# Patient Record
Sex: Female | Born: 1965 | Race: Black or African American | Hispanic: No | Smoking: Never smoker
Health system: Southern US, Community
[De-identification: ages and names within clinical notes are randomized; demographics above are authoritative.]

## PROBLEM LIST (undated history)

## (undated) ENCOUNTER — Emergency Department (HOSPITAL_COMMUNITY): Admission: EM | Payer: Medicaid Other

## (undated) DIAGNOSIS — L988 Other specified disorders of the skin and subcutaneous tissue: Secondary | ICD-10-CM

## (undated) DIAGNOSIS — I429 Cardiomyopathy, unspecified: Secondary | ICD-10-CM

## (undated) DIAGNOSIS — R7611 Nonspecific reaction to tuberculin skin test without active tuberculosis: Secondary | ICD-10-CM

## (undated) DIAGNOSIS — I509 Heart failure, unspecified: Secondary | ICD-10-CM

## (undated) DIAGNOSIS — F329 Major depressive disorder, single episode, unspecified: Secondary | ICD-10-CM

## (undated) DIAGNOSIS — F32A Depression, unspecified: Secondary | ICD-10-CM

## (undated) DIAGNOSIS — Z21 Asymptomatic human immunodeficiency virus [HIV] infection status: Secondary | ICD-10-CM

## (undated) DIAGNOSIS — B20 Human immunodeficiency virus [HIV] disease: Secondary | ICD-10-CM

## (undated) HISTORY — DX: Cardiomyopathy, unspecified: I42.9

## (undated) HISTORY — DX: Major depressive disorder, single episode, unspecified: F32.9

## (undated) HISTORY — DX: Asymptomatic human immunodeficiency virus (hiv) infection status: Z21

## (undated) HISTORY — DX: Human immunodeficiency virus (HIV) disease: B20

## (undated) HISTORY — DX: Other specified disorders of the skin and subcutaneous tissue: L98.8

## (undated) HISTORY — DX: Nonspecific reaction to tuberculin skin test without active tuberculosis: R76.11

## (undated) HISTORY — DX: Depression, unspecified: F32.A

## (undated) HISTORY — PX: ABDOMINAL HYSTERECTOMY: SHX81

## (undated) HISTORY — DX: Heart failure, unspecified: I50.9

---

## 1999-05-11 ENCOUNTER — Ambulatory Visit (HOSPITAL_COMMUNITY): Admission: RE | Admit: 1999-05-11 | Discharge: 1999-05-11 | Payer: Self-pay | Admitting: Internal Medicine

## 1999-05-11 ENCOUNTER — Encounter: Payer: Self-pay | Admitting: Internal Medicine

## 1999-06-03 ENCOUNTER — Emergency Department (HOSPITAL_COMMUNITY): Admission: EM | Admit: 1999-06-03 | Discharge: 1999-06-03 | Payer: Self-pay | Admitting: Emergency Medicine

## 1999-09-16 ENCOUNTER — Encounter: Payer: Self-pay | Admitting: Emergency Medicine

## 1999-09-16 ENCOUNTER — Emergency Department (HOSPITAL_COMMUNITY): Admission: EM | Admit: 1999-09-16 | Discharge: 1999-09-16 | Payer: Self-pay | Admitting: Emergency Medicine

## 1999-10-29 DIAGNOSIS — E1165 Type 2 diabetes mellitus with hyperglycemia: Secondary | ICD-10-CM

## 1999-10-29 DIAGNOSIS — E1140 Type 2 diabetes mellitus with diabetic neuropathy, unspecified: Secondary | ICD-10-CM | POA: Insufficient documentation

## 2000-05-19 ENCOUNTER — Emergency Department (HOSPITAL_COMMUNITY): Admission: EM | Admit: 2000-05-19 | Discharge: 2000-05-19 | Payer: Self-pay | Admitting: Emergency Medicine

## 2000-05-28 ENCOUNTER — Other Ambulatory Visit: Admission: RE | Admit: 2000-05-28 | Discharge: 2000-05-28 | Payer: Self-pay | Admitting: Family Medicine

## 2000-10-14 ENCOUNTER — Encounter: Admission: RE | Admit: 2000-10-14 | Discharge: 2000-10-14 | Payer: Self-pay | Admitting: Obstetrics & Gynecology

## 2001-02-20 ENCOUNTER — Emergency Department (HOSPITAL_COMMUNITY): Admission: EM | Admit: 2001-02-20 | Discharge: 2001-02-20 | Payer: Self-pay | Admitting: Emergency Medicine

## 2001-07-23 ENCOUNTER — Other Ambulatory Visit: Admission: RE | Admit: 2001-07-23 | Discharge: 2001-07-23 | Payer: Self-pay | Admitting: *Deleted

## 2001-07-23 ENCOUNTER — Encounter: Admission: RE | Admit: 2001-07-23 | Discharge: 2001-07-23 | Payer: Self-pay | Admitting: *Deleted

## 2001-08-06 ENCOUNTER — Encounter: Admission: RE | Admit: 2001-08-06 | Discharge: 2001-08-06 | Payer: Self-pay | Admitting: *Deleted

## 2001-11-02 ENCOUNTER — Inpatient Hospital Stay (HOSPITAL_COMMUNITY): Admission: AD | Admit: 2001-11-02 | Discharge: 2001-11-02 | Payer: Self-pay | Admitting: *Deleted

## 2001-11-27 ENCOUNTER — Other Ambulatory Visit: Admission: RE | Admit: 2001-11-27 | Discharge: 2001-11-27 | Payer: Self-pay | Admitting: *Deleted

## 2001-11-27 ENCOUNTER — Encounter (INDEPENDENT_AMBULATORY_CARE_PROVIDER_SITE_OTHER): Payer: Self-pay | Admitting: Specialist

## 2001-11-27 ENCOUNTER — Encounter: Admission: RE | Admit: 2001-11-27 | Discharge: 2001-11-27 | Payer: Self-pay | Admitting: *Deleted

## 2002-06-03 ENCOUNTER — Other Ambulatory Visit: Admission: RE | Admit: 2002-06-03 | Discharge: 2002-06-03 | Payer: Self-pay | Admitting: *Deleted

## 2002-06-03 ENCOUNTER — Encounter: Admission: RE | Admit: 2002-06-03 | Discharge: 2002-06-03 | Payer: Self-pay | Admitting: Obstetrics and Gynecology

## 2002-06-04 ENCOUNTER — Encounter (INDEPENDENT_AMBULATORY_CARE_PROVIDER_SITE_OTHER): Payer: Self-pay

## 2002-07-02 ENCOUNTER — Encounter: Admission: RE | Admit: 2002-07-02 | Discharge: 2002-07-02 | Payer: Self-pay | Admitting: Family Medicine

## 2002-07-16 ENCOUNTER — Encounter: Admission: RE | Admit: 2002-07-16 | Discharge: 2002-07-16 | Payer: Self-pay | Admitting: Family Medicine

## 2002-08-02 ENCOUNTER — Ambulatory Visit (HOSPITAL_COMMUNITY): Admission: RE | Admit: 2002-08-02 | Discharge: 2002-08-02 | Payer: Self-pay | Admitting: Family Medicine

## 2002-08-02 ENCOUNTER — Encounter (INDEPENDENT_AMBULATORY_CARE_PROVIDER_SITE_OTHER): Payer: Self-pay | Admitting: Specialist

## 2002-08-13 ENCOUNTER — Encounter: Admission: RE | Admit: 2002-08-13 | Discharge: 2002-08-13 | Payer: Self-pay | Admitting: Family Medicine

## 2002-09-09 ENCOUNTER — Encounter: Admission: RE | Admit: 2002-09-09 | Discharge: 2002-09-09 | Payer: Self-pay | Admitting: Obstetrics and Gynecology

## 2002-09-27 ENCOUNTER — Ambulatory Visit: Admission: AD | Admit: 2002-09-27 | Discharge: 2002-09-27 | Payer: Self-pay | Admitting: Family Medicine

## 2002-11-08 ENCOUNTER — Inpatient Hospital Stay (HOSPITAL_COMMUNITY): Admission: AD | Admit: 2002-11-08 | Discharge: 2002-11-09 | Payer: Self-pay | Admitting: Family Medicine

## 2002-11-08 ENCOUNTER — Encounter (INDEPENDENT_AMBULATORY_CARE_PROVIDER_SITE_OTHER): Payer: Self-pay | Admitting: Specialist

## 2002-11-15 ENCOUNTER — Inpatient Hospital Stay (HOSPITAL_COMMUNITY): Admission: AD | Admit: 2002-11-15 | Discharge: 2002-11-15 | Payer: Self-pay | Admitting: Obstetrics and Gynecology

## 2002-11-26 ENCOUNTER — Encounter: Admission: RE | Admit: 2002-11-26 | Discharge: 2002-11-26 | Payer: Self-pay | Admitting: Family Medicine

## 2002-12-10 ENCOUNTER — Encounter: Admission: RE | Admit: 2002-12-10 | Discharge: 2002-12-10 | Payer: Self-pay | Admitting: Obstetrics and Gynecology

## 2003-04-04 ENCOUNTER — Emergency Department (HOSPITAL_COMMUNITY): Admission: EM | Admit: 2003-04-04 | Discharge: 2003-04-04 | Payer: Self-pay | Admitting: Emergency Medicine

## 2003-10-17 ENCOUNTER — Emergency Department (HOSPITAL_COMMUNITY): Admission: EM | Admit: 2003-10-17 | Discharge: 2003-10-18 | Payer: Self-pay | Admitting: Emergency Medicine

## 2004-01-30 ENCOUNTER — Emergency Department (HOSPITAL_COMMUNITY): Admission: EM | Admit: 2004-01-30 | Discharge: 2004-01-30 | Payer: Self-pay | Admitting: *Deleted

## 2004-11-30 ENCOUNTER — Emergency Department (HOSPITAL_COMMUNITY): Admission: EM | Admit: 2004-11-30 | Discharge: 2004-11-30 | Payer: Self-pay | Admitting: Emergency Medicine

## 2005-05-02 ENCOUNTER — Ambulatory Visit: Payer: Self-pay | Admitting: Nurse Practitioner

## 2005-05-07 ENCOUNTER — Ambulatory Visit: Payer: Self-pay | Admitting: *Deleted

## 2005-05-18 ENCOUNTER — Emergency Department (HOSPITAL_COMMUNITY): Admission: EM | Admit: 2005-05-18 | Discharge: 2005-05-18 | Payer: Self-pay | Admitting: Emergency Medicine

## 2005-05-21 ENCOUNTER — Ambulatory Visit: Payer: Self-pay | Admitting: Nurse Practitioner

## 2005-10-31 ENCOUNTER — Emergency Department (HOSPITAL_COMMUNITY): Admission: EM | Admit: 2005-10-31 | Discharge: 2005-10-31 | Payer: Self-pay | Admitting: Emergency Medicine

## 2005-12-11 ENCOUNTER — Ambulatory Visit: Payer: Self-pay | Admitting: Nurse Practitioner

## 2006-04-11 ENCOUNTER — Emergency Department (HOSPITAL_COMMUNITY): Admission: EM | Admit: 2006-04-11 | Discharge: 2006-04-11 | Payer: Self-pay | Admitting: Emergency Medicine

## 2006-04-14 ENCOUNTER — Ambulatory Visit: Payer: Self-pay | Admitting: Internal Medicine

## 2006-10-29 DIAGNOSIS — F438 Other reactions to severe stress: Secondary | ICD-10-CM

## 2006-11-05 ENCOUNTER — Encounter (INDEPENDENT_AMBULATORY_CARE_PROVIDER_SITE_OTHER): Payer: Self-pay | Admitting: *Deleted

## 2008-09-13 LAB — CONVERTED CEMR LAB: CD4 T Helper %: 42.3 %

## 2008-10-13 ENCOUNTER — Encounter: Payer: Self-pay | Admitting: Licensed Clinical Social Worker

## 2008-10-13 ENCOUNTER — Ambulatory Visit: Payer: Self-pay | Admitting: Internal Medicine

## 2008-10-13 DIAGNOSIS — B20 Human immunodeficiency virus [HIV] disease: Secondary | ICD-10-CM | POA: Insufficient documentation

## 2008-10-19 LAB — CONVERTED CEMR LAB
ALT: 11 units/L (ref 0–35)
AST: 13 units/L (ref 0–37)
Albumin: 4.2 g/dL (ref 3.5–5.2)
Alkaline Phosphatase: 66 units/L (ref 39–117)
BUN: 8 mg/dL (ref 6–23)
Basophils Absolute: 0.1 10*3/uL (ref 0.0–0.1)
CO2: 22 meq/L (ref 19–32)
Chlamydia, Swab/Urine, PCR: NEGATIVE
Chloride: 103 meq/L (ref 96–112)
Creatinine, Ser: 0.79 mg/dL (ref 0.40–1.20)
Eosinophils Relative: 2 % (ref 0–5)
HIV-1 antibody: POSITIVE — AB
HIV-2 Ab: UNDETERMINED — AB
Hemoglobin: 12.4 g/dL (ref 12.0–15.0)
Hepatitis B Surface Ag: NEGATIVE
MCV: 70.1 fL — ABNORMAL LOW (ref >=78.0)
Monocytes Absolute: 0.6 10*3/uL (ref 0.1–1.0)
Monocytes Relative: 8 % (ref 3–12)
Neutro Abs: 2.3 10*3/uL (ref 1.7–7.7)
Neutrophils Relative %: 32 % — ABNORMAL LOW (ref 43–77)
Nitrite: NEGATIVE
Platelets: 402 10*3/uL — ABNORMAL HIGH (ref 150–400)
Potassium: 3.6 meq/L (ref 3.5–5.3)
RBC / HPF: NONE SEEN (ref <3)
Sodium: 139 meq/L (ref 135–145)
Specific Gravity, Urine: 1.029 (ref 1.005–1.0)
Total Protein: 7.8 g/dL (ref 6.0–8.3)
Urine Glucose: NEGATIVE mg/dL
pH: 6 (ref 5.0–8.0)

## 2008-10-26 ENCOUNTER — Telehealth: Payer: Self-pay | Admitting: Licensed Clinical Social Worker

## 2008-10-28 ENCOUNTER — Telehealth (INDEPENDENT_AMBULATORY_CARE_PROVIDER_SITE_OTHER): Payer: Self-pay | Admitting: *Deleted

## 2008-11-07 ENCOUNTER — Telehealth (INDEPENDENT_AMBULATORY_CARE_PROVIDER_SITE_OTHER): Payer: Self-pay | Admitting: *Deleted

## 2008-11-23 ENCOUNTER — Ambulatory Visit (HOSPITAL_COMMUNITY): Admission: RE | Admit: 2008-11-23 | Discharge: 2008-11-23 | Payer: Self-pay | Admitting: Internal Medicine

## 2008-11-23 ENCOUNTER — Ambulatory Visit: Payer: Self-pay | Admitting: Internal Medicine

## 2008-11-24 ENCOUNTER — Telehealth: Payer: Self-pay

## 2009-03-21 ENCOUNTER — Ambulatory Visit: Payer: Self-pay | Admitting: Internal Medicine

## 2009-03-22 ENCOUNTER — Telehealth (INDEPENDENT_AMBULATORY_CARE_PROVIDER_SITE_OTHER): Payer: Self-pay | Admitting: Licensed Clinical Social Worker

## 2009-03-22 LAB — CONVERTED CEMR LAB
AST: 8 units/L (ref 0–37)
Basophils Relative: 0 % (ref 0–1)
Chloride: 97 meq/L (ref 96–112)
Eosinophils Absolute: 0 10*3/uL (ref 0.0–0.7)
Eosinophils Relative: 0 % (ref 0–5)
HCT: 41 % (ref 36.0–46.0)
Hemoglobin: 12.9 g/dL (ref 12.0–15.0)
Lymphs Abs: 4.1 10*3/uL — ABNORMAL HIGH (ref 0.7–4.0)
MCHC: 31.5 g/dL (ref 30.0–36.0)
Monocytes Absolute: 0.5 10*3/uL (ref 0.1–1.0)
Neutro Abs: 2.5 10*3/uL (ref 1.7–7.7)
Platelets: 325 10*3/uL (ref 150–400)
Potassium: 4.3 meq/L (ref 3.5–5.3)
RBC: 6 M/uL — ABNORMAL HIGH (ref 3.87–5.11)
Total Bilirubin: 0.3 mg/dL (ref 0.3–1.2)
WBC: 7.2 10*3/uL (ref 4.0–10.5)

## 2009-04-04 ENCOUNTER — Ambulatory Visit: Payer: Self-pay | Admitting: Internal Medicine

## 2009-04-04 ENCOUNTER — Telehealth (INDEPENDENT_AMBULATORY_CARE_PROVIDER_SITE_OTHER): Payer: Self-pay | Admitting: *Deleted

## 2009-05-05 ENCOUNTER — Telehealth: Payer: Self-pay | Admitting: Internal Medicine

## 2009-07-03 ENCOUNTER — Ambulatory Visit: Payer: Self-pay | Admitting: Internal Medicine

## 2009-07-03 LAB — CONVERTED CEMR LAB
Alkaline Phosphatase: 66 units/L (ref 39–117)
BUN: 15 mg/dL (ref 6–23)
Basophils Absolute: 0 10*3/uL (ref 0.0–0.1)
CO2: 20 meq/L (ref 19–32)
Calcium: 9.2 mg/dL (ref 8.4–10.5)
Eosinophils Absolute: 0.1 10*3/uL (ref 0.0–0.7)
Eosinophils Relative: 1 % (ref 0–5)
HCT: 39.3 % (ref 36.0–46.0)
HIV 1 RNA Quant: 1350 copies/mL — ABNORMAL HIGH (ref <48)
HIV-1 RNA Quant, Log: 3.13 — ABNORMAL HIGH (ref <1.68)
Lymphocytes Relative: 43 % (ref 12–46)
MCHC: 31.3 g/dL (ref 30.0–36.0)
MCV: 67.1 fL — ABNORMAL LOW (ref 78.0–100.0)
Monocytes Relative: 7 % (ref 3–12)
Neutro Abs: 4.6 10*3/uL (ref 1.7–7.7)
Neutrophils Relative %: 49 % (ref 43–77)
Potassium: 4.2 meq/L (ref 3.5–5.3)
RDW: 14.7 % (ref 11.5–15.5)
Sodium: 133 meq/L — ABNORMAL LOW (ref 135–145)
WBC: 9.4 10*3/uL (ref 4.0–10.5)

## 2009-07-31 ENCOUNTER — Ambulatory Visit: Payer: Self-pay | Admitting: Internal Medicine

## 2009-10-18 ENCOUNTER — Ambulatory Visit: Payer: Self-pay | Admitting: Infectious Disease

## 2009-10-18 DIAGNOSIS — L293 Anogenital pruritus, unspecified: Secondary | ICD-10-CM

## 2009-10-18 DIAGNOSIS — N898 Other specified noninflammatory disorders of vagina: Secondary | ICD-10-CM | POA: Insufficient documentation

## 2009-10-18 LAB — CONVERTED CEMR LAB
GC Probe Amp, Urine: NEGATIVE
Ketones, ur: NEGATIVE mg/dL
Leukocytes, UA: NEGATIVE
Protein, ur: NEGATIVE mg/dL
Urobilinogen, UA: 0.2 (ref 0.0–1.0)

## 2009-11-02 ENCOUNTER — Ambulatory Visit: Payer: Self-pay | Admitting: Internal Medicine

## 2009-11-02 LAB — CONVERTED CEMR LAB
AST: 11 units/L (ref 0–37)
Albumin: 3.9 g/dL (ref 3.5–5.2)
Alkaline Phosphatase: 73 units/L (ref 39–117)
Basophils Relative: 1 % (ref 0–1)
CO2: 19 meq/L (ref 19–32)
Calcium: 9.1 mg/dL (ref 8.4–10.5)
Chloride: 96 meq/L (ref 96–112)
Glucose, Bld: 530 mg/dL — ABNORMAL HIGH (ref 70–99)
Hemoglobin: 12.6 g/dL (ref 12.0–15.0)
Lymphs Abs: 4.2 10*3/uL — ABNORMAL HIGH (ref 0.7–4.0)
MCV: 68.8 fL — ABNORMAL LOW (ref 78.0–100.0)
Monocytes Relative: 7 % (ref 3–12)
Neutro Abs: 1.6 10*3/uL — ABNORMAL LOW (ref 1.7–7.7)
Neutrophils Relative %: 26 % — ABNORMAL LOW (ref 43–77)
Platelets: 292 10*3/uL (ref 150–400)
RBC: 5.81 M/uL — ABNORMAL HIGH (ref 3.87–5.11)
Sodium: 131 meq/L — ABNORMAL LOW (ref 135–145)

## 2009-11-06 ENCOUNTER — Telehealth (INDEPENDENT_AMBULATORY_CARE_PROVIDER_SITE_OTHER): Payer: Self-pay | Admitting: *Deleted

## 2009-11-17 ENCOUNTER — Ambulatory Visit: Payer: Self-pay | Admitting: Internal Medicine

## 2009-11-17 LAB — CONVERTED CEMR LAB
Chlamydia, Swab/Urine, PCR: NEGATIVE
GC Probe Amp, Urine: NEGATIVE
Hgb A1c MFr Bld: 16.3 % — ABNORMAL HIGH (ref <5.7)

## 2009-11-18 ENCOUNTER — Encounter: Payer: Self-pay | Admitting: Internal Medicine

## 2009-11-20 LAB — CONVERTED CEMR LAB: Gardnerella vaginalis: POSITIVE — AB

## 2010-01-05 ENCOUNTER — Ambulatory Visit: Payer: Self-pay | Admitting: Internal Medicine

## 2010-01-05 DIAGNOSIS — F322 Major depressive disorder, single episode, severe without psychotic features: Secondary | ICD-10-CM | POA: Insufficient documentation

## 2010-01-08 ENCOUNTER — Encounter (INDEPENDENT_AMBULATORY_CARE_PROVIDER_SITE_OTHER): Payer: Self-pay | Admitting: *Deleted

## 2010-01-08 LAB — CONVERTED CEMR LAB
Candida species: POSITIVE — AB
Gardnerella vaginalis: POSITIVE — AB

## 2010-03-08 ENCOUNTER — Ambulatory Visit: Admit: 2010-03-08 | Payer: Self-pay | Admitting: Internal Medicine

## 2010-03-11 ENCOUNTER — Encounter: Payer: Self-pay | Admitting: Internal Medicine

## 2010-03-20 NOTE — Assessment & Plan Note (Signed)
Summary: follow up on labs/jc   CC:  follow-up visit, lab results, and pt. has been out of diabetic meds x 3 months.  History of Present Illness: Pt feels tired today.  She has been out of her DM meds for several months. She states that she does not have the money to pay for them.  Preventive Screening-Counseling & Management  Alcohol-Tobacco     Alcohol drinks/day: 2     Smoking Status: never  Caffeine-Diet-Exercise     Caffeine use/day: 0     Does Patient Exercise: yes     Type of exercise: work  Environmental education officer Use: yes      Sexual History:  currently monogamous.        Drug Use:  never.        Blood Transfusions:  no.        Travel History:  no.    Comments: pt. declined condoms   Updated Prior Medication List: METFORMIN HCL 500 MG  TABS (METFORMIN HCL) two times a day GLIPIZIDE 10 MG  TABS (GLIPIZIDE) one tablet two times a day ADULT ASPIRIN EC LOW STRENGTH 81 MG  TBEC (ASPIRIN) once daily  Current Allergies (reviewed today): No known allergies  Past History:  Past Medical History: Last updated: 10/29/2006 Diabetes mellitus, type II  Review of Systems  The patient denies anorexia, fever, and weight loss.    Vital Signs:  Patient profile:   45 year old female Menstrual status:  hysterectomy Height:      63 inches (160.02 cm) Weight:      167.8 pounds (76.27 kg) BMI:     29.83 Temp:     98.5 degrees F (36.94 degrees C) oral Pulse rate:   85 / minute BP sitting:   110 / 76  (right arm)  Vitals Entered By: Wendall Mola CMA Duncan Dull) (July 31, 2009 2:54 PM) CC: follow-up visit, lab results, pt. has been out of diabetic meds x 3 months Is Patient Diabetic? Yes Did you bring your meter with you today? No Pain Assessment Patient in pain? no      Nutritional Status BMI of 25 - 29 = overweight Nutritional Status Detail appetite "low"  Does patient need assistance? Functional Status Self care Ambulation  Normal   Physical Exam  General:  alert, well-developed, well-nourished, and well-hydrated.   Head:  normocephalic and atraumatic.   Mouth:  pharynx pink and moist.   Lungs:  normal breath sounds.      Impression & Recommendations:  Problem # 1:  HIV INFECTION (ICD-042)  Pt currently asymptomatic and not on medications. Will give her the second Hep B vaccine today and schedule for PAP.   Diagnostics Reviewed:  HIV: REACTIVE (10/13/2008)   HIV-Western blot: Positive (10/13/2008)   CD4: 1280 (07/04/2009)   WBC: 9.4 (07/03/2009)   Hgb: 12.3 (07/03/2009)   HCT: 39.3 (07/03/2009)   Platelets: 333 (07/03/2009) HIV-1 RNA: 1350 (07/03/2009)   HBSAg: NEG (10/13/2008)  Orders: Est. Patient Level IV (99214)Future Orders: T-CD4SP ([facility]) (CD4SP) ... 01/27/2010 T-HIV Viral Load [id number]) ... 01/27/2010 T-Comprehensive Metabolic Panel [id number]) ... 01/27/2010 T-CBC w/Diff ([id number]) ... 01/27/2010  Problem # 2:  DIABETES MELLITUS, TYPE II (ICD-250.00)  refill meds refer to [name] for DM education check HgbA1c Her updated medication list for this problem includes:    Metformin Hcl 500 Mg Tabs (Metformin hcl) .Marland Kitchen..Marland Kitchen Two times a day    Glipizide 10 Mg Tabs (Glipizide) ..... One tablet two times  a day    Adult Aspirin Ec Low Strength 81 Mg Tbec (Aspirin) ..... Once daily  Orders: Est. Patient Level IV (16109) T-Hgb A1C (in-house) (60454UJ) Diabetic Clinic Referral (Diabetic)  Other Orders: Hepatitis B Vaccine >62yrs (81191) Admin 1st Vaccine (47829)  Patient Instructions: 1)  Please schedule a follow-up appointment in 3 months, 2 weeks after labs. 2)  Schedule for PAP in PAP clinic  Prescriptions: METFORMIN HCL 500 MG  TABS (METFORMIN HCL) two times a day  #60 x 5   Entered and Authorized by:   Yisroel Ramming MD   Signed by:   Yisroel Ramming MD on 07/31/2009   Method used:   Print then Give to Patient   RxID:   [id number] GLIPIZIDE 10 MG  TABS (GLIPIZIDE) one  tablet two times a day  #60 x 5   Entered and Authorized by:   Yisroel Ramming MD   Signed by:   Yisroel Ramming MD on 07/31/2009   Method used:   Print then Give to Patient   RxID:   [id number]   Laboratory Results   Blood Tests   Date/Time Received: Mariea Clonts  July 31, 2009 4:35 PM  Date/Time Reported: Mariea Clonts  July 31, 2009 4:35 PM   HGBA1C: >14.0%   (Normal Range: Non-Diabetic - 3-6%   Control Diabetic - 6-8%)  Comments: Results given to [facility]).      Immunizations Administered:  Hepatitis B Vaccine # 2:    Vaccine Type: HepB Adult    Site: right deltoid    Mfr: Merck    Dose: 0.5 ml    Route: IM    Given by: Wendall Mola CMA ( AAMA)    Exp. Date: 06/18/2011    Lot #: [id number]    VIS given: 09/04/05 version given July 31, 2009.

## 2010-03-20 NOTE — Assessment & Plan Note (Signed)
Summary: F/U/VS   CC:  follow-up visit, lab results to be checked for trichomonos, gonnorhea, and and chlamydia.  History of Present Illness: Pt had unprotected sex and wants to be tested for STDs.  She c/o vagina itching. She has started exercising more to try to get her DM under better control  Preventive Screening-Counseling & Management  Alcohol-Tobacco     Alcohol drinks/day: 2     Smoking Status: never  Caffeine-Diet-Exercise     Caffeine use/day: sodas and tea     Does Patient Exercise: yes     Type of exercise: work  Environmental education officer Use: yes      Sexual History:  currently monogamous.        Drug Use:  never.        Blood Transfusions:  no.        Travel History:  no.    Comments: pt. given condoms   Updated Prior Medication List: METFORMIN HCL 500 MG  TABS (METFORMIN HCL) two times a day GLIPIZIDE 10 MG  TABS (GLIPIZIDE) one tablet two times a day ADULT ASPIRIN EC LOW STRENGTH 81 MG  TBEC (ASPIRIN) once daily  Current Allergies (reviewed today): No known allergies  Past History:  Past Medical History: Last updated: 10/29/2006 Diabetes mellitus, type II  Review of Systems  The patient denies anorexia, fever, and weight loss.    Vital Signs:  Patient profile:   45 year old female Menstrual status:  hysterectomy Height:      63 inches (160.02 cm) Weight:      168.8 pounds (76.73 kg) BMI:     30.01 Temp:     97.6 degrees F (36.44 degrees C) oral Pulse rate:   80 / minute BP sitting:   117 / 82  (left arm)  Vitals Entered By: Wendall Mola CMA Duncan Dull) (November 17, 2009 3:16 PM) CC: follow-up visit, lab results to be checked for trichomonos, gonnorhea, and chlamydia Is Patient Diabetic? Yes Did you bring your meter with you today? No Pain Assessment Patient in pain? no      Nutritional Status BMI of > 30 = obese Nutritional Status Detail appetite "good"  Does patient need assistance? Functional Status  Self care Ambulation Normal Comments no missed doses of meds per pt.   Physical Exam  General:  alert, well-developed, well-nourished, and well-hydrated.   Head:  normocephalic and atraumatic.   Mouth:  pharynx pink and moist.   Lungs:  normal breath sounds.      Impression & Recommendations:  Problem # 1:  HIV INFECTION (ICD-042) Pt currently asymptomatic and not on therapy. She will return in 6 months for repeat labs. Diagnostics Reviewed:  HIV: REACTIVE (10/13/2008)   HIV-Western blot: Positive (10/13/2008)   CD4: 1370 (11/03/2009)   WBC: 6.4 (11/02/2009)   Hgb: 12.6 (11/02/2009)   HCT: 40.0 (11/02/2009)   Platelets: 292 (11/02/2009) HIV-1 RNA: 1930 (11/02/2009)   HBSAg: NEG (10/13/2008)  Orders: T-Chlamydia  Probe, urine ([id number]) T-GC Probe, urine ([id number]) T-Wet Prep by Molecular Probe ([id number])Future Orders: T-CD4SP ([facility]) (CD4SP) ... 05/16/2010 T-HIV Viral Load [id number]) ... 05/16/2010 T-Comprehensive Metabolic Panel [id number]) ... 05/16/2010 T-CBC w/Diff ([id number]) ... 05/16/2010 T-RPR (Syphilis) [phone]) ... 05/16/2010 T-Lipid Profile [id number]) ... 05/16/2010  Problem # 2:  VAGINAL DISCHARGE (ICD-623.5) check wet prep, GC and chlamydia fluconazole for possible yeast infection  Problem # 3:  DIABETES MELLITUS, TYPE II (ICD-250.00) pt encouraged to take her meds, follow diet and exercise Her  updated medication list for this problem includes:    Metformin Hcl 500 Mg Tabs (Metformin hcl) .Marland Kitchen..Marland Kitchen Two times a day    Glipizide 10 Mg Tabs (Glipizide) ..... One tablet two times a day    Adult Aspirin Ec Low Strength 81 Mg Tbec (Aspirin) ..... Once daily  Orders: T-Hgb A1C (in-house) [phone])  Other Orders: Influenza Vaccine NON MCR (02725) TB Skin Test (36644) Admin 1st Vaccine (03474)  Patient Instructions: 1)  Please schedule a follow-up appointment in 6 months, 2 weeks after labs.  Prescriptions: FLUCONAZOLE 150 MG TABS  (FLUCONAZOLE) Take 1 tablet by mouth once a day  #2 x 0   Entered and Authorized by:   Yisroel Ramming MD   Signed by:   Yisroel Ramming MD on 11/17/2009   Method used:   Print then Give to Patient   RxID:   [id number]    Immunizations Administered:  Influenza Vaccine # 1:    Vaccine Type: Fluvax Non-MCR    Site: left deltoid    Mfr: Novartis    Dose: 0.5 ml    Route: IM    Given by: Wendall Mola CMA ( AAMA)    Exp. Date: 05/20/2010    Lot #: [id number]    VIS given: 09/12/09 version given November 17, 2009.  PPD Skin Test:    Vaccine Type: PPD    Site: left forearm    Mfr: Sanofi Pasteur    Dose: 0.1 ml    Route: ID    Given by: Wendall Mola CMA ( AAMA)    Exp. Date: 12/21/2010    Lot #: [id number]  Flu Vaccine Consent Questions:    Do you have a history of severe allergic reactions to this vaccine? no    Any prior history of allergic reactions to egg and/or gelatin? no    Do you have a sensitivity to the preservative Thimersol? no    Do you have a past history of Guillan-Barre Syndrome? no    Do you currently have an acute febrile illness? no    Have you ever had a severe reaction to latex? no    Vaccine information given and explained to patient? yes    Are you currently pregnant? no    Appended Document: Immunization Entry      PPD Results    Date of reading: 11/20/2009    Results: < 5mm    Interpretation: negative

## 2010-03-20 NOTE — Progress Notes (Signed)
Summary: diabetes referral/dmr  Phone Note Outgoing Call   Call placed by: Jamison Neighbor RD,CDE,  April 04, 2009 4:38 PM Summary of Call: asked to call and schedule visit with CDE by DR. Vollmer- called phone number twice. Female hung up on me twice. Discussed with Tamika. Other contact numbers not working. will mail letter asking patient to call to schedule.

## 2010-03-20 NOTE — Progress Notes (Signed)
Summary: phone note-TY  Phone Note Outgoing Call   Caller: Patient Call placed by: Starleen Arms CMA,  March 22, 2009 10:53 AM Call placed to: Patient Summary of Call: Called patient to inform her that her glucose was 405 and to see if she has been taking her DM  meds. Unable to reach her on the phone number listed. Needs a referral to Jamison Neighbor. Initial call taken by: Starleen Arms CMA,  March 22, 2009 10:54 AM     Appended Document: phone note-TY I tried to call patient today and was unable to reach her by phone.She has an appt on 2/14

## 2010-03-20 NOTE — Assessment & Plan Note (Signed)
Summary: vaginal irritation/ty   CC:  vaginal irritation.  History of Present Illness: Pt is a 45 y/o F with HIV, currently not on ART who presents today with c/c of vaginal itching and pain.  SHe reports 2 weeks of vaginal itching, swelling, and pain.  She notes small amouts of clear vaginal discharge with occasional, white patchy clumps.  She states this feels like chlamydia, gonorrhea, or trichomonas and is not like a typical yeast infection.    She denies fever, chills, abdominal/pelvic pain , dysuria, hematuria, foul odor, vaginal bleeding, urinary urgency or freq, flank pain, n/v/d, rectal bleeding, thick green or purulent vaginal discharge.  She does not have any allergies. She is currently on day 2/7 of otc vaginal monistat with no significant relief.  She denies any recent abx.  Believes she has an std from having unprotected sex with an unfaithful boyfriend.  Pt refuses pelvic exam today, states she does not have the time to stay for a pelvic exam or lab work .      Preventive Screening-Counseling & Management  Alcohol-Tobacco     Alcohol drinks/day: 2     Smoking Status: never  Caffeine-Diet-Exercise     Caffeine use/day: sodas and tea     Does Patient Exercise: yes     Type of exercise: work  Environmental education officer Use: yes   Updated Prior Medication List: METFORMIN HCL 500 MG  TABS (METFORMIN HCL) two times a day GLIPIZIDE 10 MG  TABS (GLIPIZIDE) one tablet two times a day ADULT ASPIRIN EC LOW STRENGTH 81 MG  TBEC (ASPIRIN) once daily METRONIDAZOLE 500 MG TABS (METRONIDAZOLE) Take 4 pill by mouth at once CEFTRIAXONE SODIUM 250 MG SOLR (CEFTRIAXONE SODIUM) Inject 250mg  IM x 1  Current Allergies (reviewed today): No known allergies  Past History:  Past medical, surgical, family and social histories (including risk factors) reviewed for relevance to current acute and chronic problems.  Past Medical History: Reviewed history from 10/29/2006 and no changes  required. Diabetes mellitus, type II  Family History: Reviewed history and no changes required.  Social History: Reviewed history and no changes required.  Vital Signs:  Patient profile:   45 year old female Menstrual status:  hysterectomy Height:      63 inches (160.02 cm) Weight:      165.0 pounds (75 kg) BMI:     29.33 Temp:     98.2 degrees F (36.78 degrees C) oral Pulse rate:   94 / minute BP sitting:   123 / 77  (left arm)  Vitals Entered By: Baxter Hire) (October 18, 2009 3:21 PM) CC: vaginal irritation Pain Assessment Patient in pain? no      Nutritional Status BMI of 25 - 29 = overweight Nutritional Status Detail appetite is fair per patient  Does patient need assistance? Functional Status Self care Ambulation Normal   Physical Exam  General:  alert, well-developed, well-nourished, and well-hydrated.   Head:  normocephalic and atraumatic.   Eyes:  vision grossly intact.  PERRL.  EOMI. Mouth:  pharynx pink and moist.   Lungs:  normal breath sounds.  normal respiratory effort.   Heart:  normal rate, regular rhythm, and no murmur.   Abdomen:  soft, non-tender, normal bowel sounds, no distention, no masses, no guarding, no rigidity, and no rebound tenderness.   Genitalia:  pt refuses exam no cva tenderness no suprapubic tenderness Extremities:  no edema Neurologic:  alert & oriented X3, cranial nerves II-XII intact, and  gait normal.   Skin:  turgor normal, color normal, and no rashes.   Psych:  Oriented X3, memory intact for recent and remote, and normally interactive.      Impression & Recommendations:  Problem # 1:  VAGINAL DISCHARGE (ICD-623.5) Pts vaginal discharge, pain, and itching may be the result of numerous possible etiologies.  She refuses a pelvic exam today, despite strong encouragement to have exam for complete evaluation of her symptoms.  Will send urine for routine analysis, cx, gonorrhea, and chlamydia.  Will tx empirically with  1gm of Azitho (given by mouth in clinic; witnessed), 250mg  ceftriaxone IM x 1 (given in clinic), and script for 2gm or oral metronidazole.  Pt is currently on day 2 of 7 of vaginal otc monistat.  Advised her to complete 7 day course of antifugals and to call clinic for fluconazole script if symptoms do not improve.  Advised pt to rtc or go the ER is she develops fever, chills, n/v/d, severe abdominal/pelvic/flank pain, worsening vaginal discharge, or other concerning symptoms.    Orders: Est. Patient Level IV (38756)  Problem # 2:  HIV INFECTION (ICD-042) Pts viral load is increasing; she is due for repeat CD4 and viral load.  She refuses all blood work today.  She has been having unprotected sex with an HIV negative partner.  Informed pt that she needs to use condoms to prevent STDs, including the possibiliy of aquiring an additional HIV virus that may confer ART resistance that would complicate her ART regimen in the future.  Also informed pt that she is at increased risk of transmitting her HIV virus to sexual partners with a detectable viral load.  WIll have pt rtc in 52month for CD4, VL, genotype, CBC, CMET, and routine f/u of her HIV.    Orders: T-Urinalysis ([id number]) T-Culture, Urine ([id number]) T-GC Probe, urine ([id number]) T-Chlamydia  Probe, urine ([id number]) Est. Patient Level IV (99214)Future Orders: T-CD4SP ([facility]) (CD4SP) ... 11/17/2009 T-HIV Viral Load [phone]) ... 11/17/2009 T-Comprehensive Metabolic Panel [id number]) ... 11/17/2009 T-CBC w/Diff ([id number]) ... 11/17/2009 T-HIV Genotype ([id number]) ... 11/17/2009  Her updated medication list for this problem includes:    Metronidazole 500 Mg Tabs (Metronidazole) .Marland Kitchen... Take 4 pill by mouth at once    Ceftriaxone Sodium 250 Mg Solr (Ceftriaxone sodium) ..... Inject 250mg  im x 1  Medications Added to Medication List This Visit: 1)  Metronidazole 500 Mg Tabs (Metronidazole) .... Take 4 pill by mouth at  once 2)  Ceftriaxone Sodium 250 Mg Solr (Ceftriaxone sodium) .... Inject 250mg  im x 1  Complete Medication List: 1)  Metformin Hcl 500 Mg Tabs (Metformin hcl) .... Two times a day 2)  Glipizide 10 Mg Tabs (Glipizide) .... One tablet two times a day 3)  Adult Aspirin Ec Low Strength 81 Mg Tbec (Aspirin) .... Once daily 4)  Metronidazole 500 Mg Tabs (Metronidazole) .... Take 4 pill by mouth at once 5)  Ceftriaxone Sodium 250 Mg Solr (Ceftriaxone sodium) .... Inject 250mg  im x 1  Patient Instructions: 1)  If you develop any chills, fever, severe nausea/vomiting/diarrhea, worsening pain or vaginal discharge, call the clinic or go to the ER. 2)  Please schedule a follow-up appointment in 1 month with Dr. Philipp Deputy. 3)  Be sure to return for lab work one (1) week before your next appointment as scheduled. 4)  Avoid having unprotected sex. 5)  DO NOT DRINK ANY ALCOHOL while taking metroniazole.  Prescriptions: METRONIDAZOLE 500 MG TABS (METRONIDAZOLE) Take 4 pill by mouth at once  #  4 x 0   Entered by:   Nelda Bucks DO   Authorized by:   Acey Lav MD   Signed by:   Nelda Bucks DO on 10/18/2009   Method used:   Print then Give to Patient   RxID:   [id number]   Appended Document: vaginal irritation/ty I examined the patient with the resident and agree with the plan as outlined in her note. The patient would not allow [place] to do a pelvic exam which has compromised our ability to diagnose what is wrong with her. She has received rx for GC, chlamydia, and for trichoomonas. She should not schedule urgent visits like this if she is unwilling to be cooperative with the exam. She also refused bloodwork today.  Appended Document: Orders Update/meds    Clinical Lists Changes  Orders: Added new Service order of Admin of Therapeutic Inj  intramuscular or subcutaneous (10272) - Signed Added new Service order of Rocephin  250mg  (Z3664) - Signed Added new Service order of Azithromycin  oral (Q0347) - Signed       Medication Administration  Injection # 1:    Medication: Rocephin  250mg     Diagnosis: VAGINAL DISCHARGE (ICD-623.5)    Route: IM    Site: RUOQ gluteus    Exp Date: 07/2010    Lot #: [id number]    Mfr: [employer]    Patient tolerated injection without complications    Given by: Kathi Simpers [facility]) (October 19, 2009 8:38 AM)  Medication # 1:    Medication: Azithromycin oral    Diagnosis: VAGINAL DISCHARGE (ICD-623.5)    Dose: 250mg  X 4    Route: po    Exp Date: 06/2010    Lot #: [id number]    Mfr: [employer]    Patient tolerated medication without complications    Given by: Kathi Simpers [facility]) (October 19, 2009 8:42 AM)  Orders Added: 1)  Admin of Therapeutic Inj  intramuscular or subcutaneous [96372] 2)  Rocephin  250mg  [J0696] 3)  Azithromycin oral [Q0144]

## 2010-03-20 NOTE — Progress Notes (Signed)
Summary: pt called complaining of chest congestion  Phone Note Call from Patient   Caller: Patient Call For: Dr. Philipp Deputy Reason for Call: Acute Illness Complaint: Cough/Sore throat, Breathing Problems Action Taken: Phone Call Completed, Provider Notified Details for Reason: pt. not feeling well Summary of Call: pt. called complaining of cough, congestion and fever x 3 days, Mother has pneumonia.  She is concerned that she has pneuomonia also.  Advised pt. to go to urgent care or ER as there is no clinic Dr. today.  Patient stated she would go. Wendall Mola CMA Duncan Dull)  May 05, 2009 10:59 AM  Initial call taken by: Wendall Mola CMA Duncan Dull),  May 05, 2009 11:00 AM

## 2010-03-20 NOTE — Progress Notes (Signed)
Summary: PPD  Phone Note Outgoing Call   Call placed by: Annice Pih Summary of Call: Pt. needs PPD at next office visit Initial call taken by: Wendall Mola CMA Duncan Dull),  November 06, 2009 12:55 PM

## 2010-03-20 NOTE — Assessment & Plan Note (Signed)
Summary: fukam   CC:  f/u .  History of Present Illness: Pt has been struggling financially and is under a lot of stress.  She is having toruble finding resources in [place] and is considering moving to [place]. She has 3 children and a sick mother she is trying to support. She c/o back pain.  Preventive Screening-Counseling & Management  Alcohol-Tobacco     Alcohol drinks/day: 2     Smoking Status: never      Sexual History:  currently monogamous.        Drug Use:  never.        Blood Transfusions:  no.        Travel History:  no.     Updated Prior Medication List: METFORMIN HCL 500 MG  TABS (METFORMIN HCL) two times a day GLIPIZIDE 10 MG  TABS (GLIPIZIDE) one tablet two times a day ADULT ASPIRIN EC LOW STRENGTH 81 MG  TBEC (ASPIRIN) once daily  Current Allergies (reviewed today): No known allergies  Past History:  Past Medical History: Last updated: 10/29/2006 Diabetes mellitus, type II  Social History: Blood Transfusions:  no Travel History:  no Drug Use:  never Sexual History:  currently monogamous  Review of Systems  The patient denies anorexia, fever, and weight loss.    Vital Signs:  Patient profile:   45 year old female Menstrual status:  hysterectomy Height:      63 inches (160.02 cm) Weight:      163 pounds (74.09 kg) BMI:     28.98 Temp:     98.2 degrees F (36.78 degrees C) oral Pulse rate:   93 / minute BP sitting:   122 / 81  (left arm)  Vitals Entered By: Starleen Arms CMA (April 04, 2009 2:46 PM) CC: f/u  Is Patient Diabetic? Yes Did you bring your meter with you today? No Pain Assessment Patient in pain? no      Nutritional Status BMI of 25 - 29 = overweight Nutritional Status Detail nl  Have you ever been in a relationship where you felt threatened, hurt or afraid?No   Does patient need assistance? Functional Status Self care Ambulation Normal   Physical Exam  General:  alert, well-developed, well-nourished,  and well-hydrated.   Head:  normocephalic and atraumatic.   Mouth:  pharynx pink and moist.   Lungs:  normal breath sounds.      Impression & Recommendations:  Problem # 1:  HIV INFECTION (ICD-042)  Pt currently asymptomatic and not on treatment. She will return in 3 months for repeat labs.  Hep B vaccine #1 given today.  will schedule for a PAP. Diagnostics Reviewed:  HIV: REACTIVE (10/13/2008)   HIV-Western blot: Positive (10/13/2008)   CD4: 1440 (03/22/2009)   WBC: 7.2 (03/21/2009)   Hgb: 12.9 (03/21/2009)   HCT: 41.0 (03/21/2009)   Platelets: 325 (03/21/2009) HIV-1 RNA: 846 (03/21/2009)   HBSAg: NEG (10/13/2008)  Orders: Est. Patient Level III (99213)Future Orders: T-CD4SP ([facility]) (CD4SP) ... 07/03/2009 T-HIV Viral Load [phone]) ... 07/03/2009 T-Comprehensive Metabolic Panel [phone]) ... 07/03/2009 T-CBC w/Diff ([id number]) ... 07/03/2009  Problem # 2:  DIABETES MELLITUS, TYPE II (ICD-250.00)  Will check a HgbA1c Her updated medication list for this problem includes:    Metformin Hcl 500 Mg Tabs (Metformin hcl) .Marland Kitchen..Marland Kitchen Two times a day    Glipizide 10 Mg Tabs (Glipizide) ..... One tablet two times a day    Adult Aspirin Ec Low Strength 81 Mg Tbec (Aspirin) ..... Once daily  Orders: T-Hgb A1C (in-house) [phone])  Other Orders: Hepatitis B Vaccine >71yrs (51761) Admin 1st Vaccine (60737)  Will refer to social work for financial assistance.  Patient Instructions: 1)  Please schedule a follow-up appointment in 3 months, 2 weeks after labs. 2)  Schedule for PAP in PAP clinic      Immunization History:  Influenza Immunization History:    Influenza:  historical (11/21/2008)  Immunizations Administered:  Hepatitis B Vaccine # 1:    Vaccine Type: HepB Adult    Site: right deltoid    Mfr: Merck    Dose: 0.5 ml    Route: IM    Given by: Starleen Arms CMA    Exp. Date: 01/21/2011    Lot #: [id number]    VIS given: 09/04/05 version given April 04, 2009.  Laboratory Results   Blood Tests   Date/Time Received: April 04, 2009 3:20 PM Date/Time Reported: Alric Quan  April 04, 2009 3:20 PM   HGBA1C: >14.0%   (Normal Range: Non-Diabetic - 3-6%   Control Diabetic - 6-8%)

## 2010-03-20 NOTE — Assessment & Plan Note (Signed)
Summary: f/u [mkj]   CC:  follow-up visit, pt. c/o vaginal itching, lost 10 lbs., foodstamps decreased, and has not taken diabetic meds for one month.  History of Present Illness:   Patient complains of vaginal itching for the last several days.  She also states that she is feeling very depressed because she is caring for her elderly mother as well as her [place].  She feels that she is not getting any help.  She has not been taking her diabetes medications because she just doesn't feel like she has time for herself.  Her food stamps have been cutt so it's difficult for her to get food.she sometimes only eats one meal a day because of the lack of food that she has.  She has continued to try to work with and has been unable to work all the hours that she would like to be able to get the income she would like to have.  She is not interested in antidepressants or sleep medications because she has to be awake to take care of her grandbaby.  Preventive Screening-Counseling & Management  Alcohol-Tobacco     Alcohol drinks/day: 2     Smoking Status: never  Caffeine-Diet-Exercise     Caffeine use/day: sodas and tea     Does Patient Exercise: yes     Type of exercise: work  Environmental education officer Use: yes      Sexual History:  currently monogamous.        Drug Use:  never.        Blood Transfusions:  no.        Travel History:  no.    Comments: pt. declined condoms   Updated Prior Medication List: METFORMIN HCL 500 MG  TABS (METFORMIN HCL) two times a day GLIPIZIDE 10 MG  TABS (GLIPIZIDE) one tablet two times a day ADULT ASPIRIN EC LOW STRENGTH 81 MG  TBEC (ASPIRIN) once daily  Current Allergies: No known allergies  Past History:  Past Medical History: Last updated: 10/29/2006 Diabetes mellitus, type II  Review of Systems       The patient complains of weight loss.  The patient denies anorexia, fever, and headaches.    Vital Signs:  Patient profile:   45  year old female Menstrual status:  hysterectomy Height:      63 inches (160.02 cm) Weight:      158.8 pounds (72.18 kg) BMI:     28.23 Temp:     97.7 degrees F (36.50 degrees C) oral Pulse rate:   88 / minute BP sitting:   116 / 83  (left arm)  Vitals Entered By: Wendall Mola CMA Duncan Dull) (January 05, 2010 9:57 AM) CC: follow-up visit, pt. c/o vaginal itching, lost 10 lbs., foodstamps decreased, has not taken diabetic meds for one month Is Patient Diabetic? Yes Did you bring your meter with you today? No Pain Assessment Patient in pain? yes     Location: back Intensity: 6 Type: aching Onset of pain  Intermittent Nutritional Status BMI of 25 - 29 = overweight Nutritional Status Detail appetite "good"  Have you ever been in a relationship where you felt threatened, hurt or afraid?No   Does patient need assistance? Functional Status Self care Ambulation Normal   Physical Exam  General:  alert, well-developed, well-nourished, and well-hydrated.   Head:  normocephalic and atraumatic.   Mouth:  pharynx pink and moist.   Lungs:  normal breath sounds.     Impression &  Recommendations:  Problem # 1:  DEPRESSION, ACUTE (ICD-296.23) will refer her to our mental health counselor.  Will also refer her to [facility] so that they can help her with food.  I offered her antidepressants but she is not interested in at this time. Orders: Est. Patient Level IV (85462)  Problem # 2:  VAGINAL DISCHARGE (ICD-623.5) I will obtain a wet prep and call her with the results. Orders: T-Wet Prep by [facility] [phone])  Problem # 3:  HIV INFECTION (ICD-042) she is currently not on medication and should follow-up as scheduled. Orders: Est. Patient Level IV (82993)  Diagnostics Reviewed:  HIV: REACTIVE (10/13/2008)   HIV-Western blot: Positive (10/13/2008)   CD4: 1370 (11/03/2009)   WBC: 6.4 (11/02/2009)   Hgb: 12.6 (11/02/2009)   HCT: 40.0 (11/02/2009)   Platelets: 292  (11/02/2009) HIV-1 RNA: 1930 (11/02/2009)   HBSAg: NEG (10/13/2008) Prescriptions: FLUCONAZOLE 150 MG TABS (FLUCONAZOLE) Take 1 tablet by mouth once a day  #2 x 0   Entered and Authorized by:   Yisroel Ramming MD   Signed by:   Yisroel Ramming MD on 01/05/2010   Method used:   Print then Give to Patient   RxID:   [id number]

## 2010-03-20 NOTE — Miscellaneous (Signed)
Summary: med. update  Clinical Lists Changes  Medications: Added new medication of FLAGYL 500 MG TABS (METRONIDAZOLE) Take 1 tablet by mouth two times a day for 7 days

## 2010-05-02 ENCOUNTER — Other Ambulatory Visit: Payer: Self-pay

## 2010-05-03 LAB — T-HELPER CELL (CD4) - (RCID CLINIC ONLY): CD4 % Helper T Cell: 34 % (ref 33–55)

## 2010-05-11 LAB — T-HELPER CELL (CD4) - (RCID CLINIC ONLY): CD4 % Helper T Cell: 36 % (ref 33–55)

## 2010-05-16 ENCOUNTER — Ambulatory Visit: Payer: Self-pay | Admitting: Adult Health

## 2010-06-14 ENCOUNTER — Other Ambulatory Visit: Payer: Self-pay

## 2010-06-28 ENCOUNTER — Other Ambulatory Visit: Payer: Self-pay

## 2010-07-02 ENCOUNTER — Ambulatory Visit: Payer: Self-pay | Admitting: Adult Health

## 2010-07-06 NOTE — Discharge Summary (Signed)
   NAME:  Ashley Vaughn, BROMELL                          ACCOUNT NO.:  [id number]   MEDICAL RECORD NO.:  [id number]                   PATIENT TYPE:  INP   LOCATION:  9116                                 FACILITY:  [facility]   PHYSICIAN:  Tanya S. Shawnie Pons, M.D.                DATE OF BIRTH:  1965-03-26   DATE OF ADMISSION:  11/08/2002  DATE OF DISCHARGE:  11/09/2002                                 DISCHARGE SUMMARY   FINAL DIAGNOSES:  1. Persistent cervical intraepithelial neoplasia grade 3.  2. Diabetes mellitus, poorly controlled.   PROCEDURE:  Transvaginal hysterectomy.   REASON FOR ADMISSION:  Briefly, the patient is a 45 year old, gravida 4,  para 4, who had persistent CIN 3 after a LEEP followed by cold knife cone  with positive margins with still CIN 3 who came in for definitive therapy.   HOSPITAL COURSE:  The patient was admitted on the day of surgery and  promptly underwent a transvaginal hysterectomy without complication.  Postoperatively, she was transferred to the floor and rested on  postoperative day #1.  On postoperative day #2, her catheter was  discontinued.  She was able to ambulate without difficulty.  She was  tolerating a regular diet, began passing flatus and stool after a  suppository.  She remained afebrile throughout her hospitalization.  Because  she was doing so well and was requesting discharge, she was discharged on  postoperative day number #2.   DISCHARGE DISPOSITION:  She was discharged home.   CONDITION ON DISCHARGE:  Good.   MEDICATIONS:  1. Insulin 70/30, 15 units subcu q.a.m., 15 units subcu q.p.m. at dinner.  2. Percocet 5/325, 1-2 p.o. q.4-6 h. p.r.n. pain.   FOLLOWUP:  Her followup will be in the GYN Clinic in approximately two  weeks.  She is to remain on pelvic rest and to stay out of work until such  time that she follows up.  She is also to be on an ADA diet with fingerstick  blood sugars b.i.d. during this time.  Her diabetic followup will be  with  [facility].                                               [name]. Shawnie Pons, M.D.    TSP/MEDQ  D:  11/09/2002  T:  11/10/2002  Job:  [id number]

## 2010-07-06 NOTE — Op Note (Signed)
   NAME:  Ashley Vaughn, Ashley Vaughn NO.:  [id number]   MEDICAL RECORD NO.:  [id number]                   PATIENT TYPE:  AMB   LOCATION:  SDC                                  FACILITY:  [facility]   PHYSICIAN:  Tanya S. Shawnie Pons, M.D.                DATE OF BIRTH:  02-23-1965   DATE OF PROCEDURE:  08/02/2002  DATE OF DISCHARGE:                                 OPERATIVE REPORT   PREOPERATIVE DIAGNOSIS:  Persistent CIN-III after loop electrosurgical  excision procedure.   POSTOPERATIVE DIAGNOSIS:  Persistent CIN-III after loop electrosurgical  excision procedure.   PROCEDURE:  Cold knife cone.   SURGEON:  [name]. Shawnie Pons, M.D.   ANESTHESIA:  MAC per Dorinda Hill T. Pamalee Leyden, M.D.   FINDINGS:  A normal-appearing cervix with LEEP scar noted in the  endocervical canal.   ESTIMATED BLOOD LOSS:  Approximately 25 mL.   COMPLICATIONS:  None.   SPECIMENS:  Cone to pathology.   INDICATIONS FOR PROCEDURE:  The patient is a 45 year old G5, P4, AB1 who had  previously undergone a LEEP in October of 2003, and then had persistent CIN-  III after the procedure.  She is also a diabetic, has a history of positive  PPD and bronchitis.  Given the persistence of abnormality, it was felt she  would be a candidate for cold knife cone.   DESCRIPTION OF PROCEDURE:  The patient is taken to the OR, placed in the  dorsal lithotomy position and Allen stirrups after anesthesia is induced.  The perineum and vagina are prepped and draped in the usual sterile fashion.  A weighted speculum was then placed inside the vagina.  Sims retractor  superiorly to visualize the cervix.  The cervix was then grasped with the  ring forceps and two sutures were placed laterally on the cervix for  hemostatic purposes.  Cervix was then copiously painted with Lugol's  solution and several small abnormalities were noted at the squamocolumnar  junction.  A small cone was then taken and marked at 12 o'clock for  pathology  using a knife.  Hemostasis was obtained using two figure-of-eight  sutures as well as a electrocautery.  The patient tolerated the procedure  well.  She was awakened and taken to the recovery room in stable condition.                                               [name]. Shawnie Pons, M.D.    TSP/MEDQ  D:  08/02/2002  T:  08/02/2002  Job:  [id number]

## 2010-07-06 NOTE — Op Note (Signed)
NAME:  Ashley Vaughn, Ashley Vaughn                          ACCOUNT NO.:  [id number]   MEDICAL RECORD NO.:  [id number]                   PATIENT TYPE:  INP   LOCATION:  9116                                 [facility]ACILITY:  [facility]   PHYSICIAN:  Tanya S. Shawnie Pons, M.D.                DATE OF BIRTH:  1965/03/08   DATE OF PROCEDURE:  11/08/2002  DATE OF DISCHARGE:                                 OPERATIVE REPORT   PREOPERATIVE DIAGNOSES:  Persistent CIN III and labial abscess.   POSTOPERATIVE DIAGNOSES:  Persistent CIN III and labial abscess.   PROCEDURE:  Total vaginal hysterectomy with an I&D.   SURGEON:  [name]. Shawnie Pons, M.D.   ASSISTANT:  Elsie Lincoln.   ANESTHESIA:  General endotracheal anesthesia tube with Dr. Arby Barrette.   FINDINGS:  Normal-appearing uterus and endometrium.  Labial abscess on the  patient's left.   ESTIMATED BLOOD LOSS:  100 mL.   COMPLICATIONS:  None.   SPECIMENS:  Uterus to pathology.   INDICATIONS FOR PROCEDURE:  Briefly, the patient is a 45 year old gravida 4,  para 4, who has persistent CIN-3 by LEEP and cold knife cone who has  completed her childbearing.  The risks and benefits of this procedure were  discussed with the patient including risk of injury to bladder and bowel,  ureters, the risk of bleeding and infection.  The patient understood these  risks and agreed to proceed.  Office notes were signed prior to proceeding.  Additionally, the patient complained on the day of surgery of left labial  abscess that was bothering her and would like to have something done about  it.   DESCRIPTION OF PROCEDURE:  The patient was taken to the OR where she placed  in dorsal lithotomy position and Allen stirrups.  An examination was then  conducted under anesthesia.  The uterus was found to be approximately eight-  week size and mobile.  The patient was then prepped and draped in the usual  sterile fashion after anesthesia had been infused.  The patient was drained  of clear  urine with red rubber catheter.  A weighted speculum was then  placed atop the vagina and a Deaver used anteriorly at the lip of the cervix  which was grasped with two double tooth tenaculums.  Cervix was then  circumferentially injected with 1% lidocaine with epinephrine.  A knife was  then used to make circumferential incision about the cervix.  The vagina was  then pushed away from the underlying cervix circumferentially with dry lap  pad; also with a dry Raytec.  The anterior peritoneum was found and the  peritoneal cavity entered sharply.  Posteriorly, the peritoneal cavity was  also entered sharply and this incision was extended laterally.  A suture was  then used to tie the posterior peritoneal edge to the vagina cuff.  The long  weighted speculum was then placed inside the peritoneal cavity.  The Heaney  clamp was then used to grasp the uterosacral and cardinal ligaments, which  were subsequent ligated.  A suture of 1-0 Vicryl in Heaney fashion was used  and tagged bilaterally.  These arteries were subsequently grasped with  Heaney clamps and then suture ligated.  A towel clamp was then used to bring  this fundus of the uterus out of the vagina and the tuboovarian pedicle was  then grasped with the Heaney clamp and ligated.  A free tie was placed  initially with flashing of the clamp followed by Heaney suture ligation.  These were tagged bilaterally.  The uterus was then removed and a sponge  used to locate all the pedicles which appeared to be hemostatic.  There was  some bleeding from the anterior cuff of the vagina.  Subsequently, the  uterosacral ligament on the patient's left was grasped and tied with a  figure-of-eight suture and then tied to the patient's left uterosacral  ligament to create support.  The vagina was then closed with a figure-of-  eight in a vertical fashion.  Good hemostasis was noted.  Attention was then  turned to the labial aspect on the patient's left  labia majora about the  mons pubis.  This was felt to be exceptionally indurated but not fluctuant.  A knife was used to stab through the cavity and some purulent material was  obtained.  A half inch Iodoform gauze was then placed inside the wound to  keep it open and then this was trimmed.  Attention was then turned to the  uterus which was opened longitudinally.  As stated previously, the  endometrium and cervix looked normal as did the myometrium with the  exception of being boggy.  A Foley catheter was then placed inside the  patient's bladder and clear urine was obtained.  The patient was taken out  of dorsal lithotomy position, was awakened, and taken to the recovery room  in stable condition.  All instrument, needle and lap counts correct x2.                                               [name]. Shawnie Pons, M.D.    TSP/MEDQ  D:  11/08/2002  T:  11/08/2002  Job:  [id number]

## 2010-09-18 ENCOUNTER — Other Ambulatory Visit: Payer: Self-pay

## 2010-09-21 ENCOUNTER — Other Ambulatory Visit: Payer: Self-pay

## 2010-10-02 ENCOUNTER — Ambulatory Visit: Payer: Self-pay

## 2010-10-02 ENCOUNTER — Ambulatory Visit: Payer: Self-pay | Admitting: Adult Health

## 2010-10-03 DIAGNOSIS — B20 Human immunodeficiency virus [HIV] disease: Secondary | ICD-10-CM

## 2010-10-03 DIAGNOSIS — Z79899 Other long term (current) drug therapy: Secondary | ICD-10-CM

## 2010-10-03 DIAGNOSIS — Z113 Encounter for screening for infections with a predominantly sexual mode of transmission: Secondary | ICD-10-CM

## 2010-10-04 ENCOUNTER — Other Ambulatory Visit (INDEPENDENT_AMBULATORY_CARE_PROVIDER_SITE_OTHER): Payer: Self-pay

## 2010-10-04 DIAGNOSIS — Z79899 Other long term (current) drug therapy: Secondary | ICD-10-CM

## 2010-10-04 DIAGNOSIS — B20 Human immunodeficiency virus [HIV] disease: Secondary | ICD-10-CM

## 2010-10-04 DIAGNOSIS — Z113 Encounter for screening for infections with a predominantly sexual mode of transmission: Secondary | ICD-10-CM

## 2010-10-05 LAB — CBC WITH DIFFERENTIAL/PLATELET
Basophils Absolute: 0 10*3/uL (ref 0.0–0.1)
Basophils Relative: 0 % (ref 0–1)
Eosinophils Absolute: 0.1 10*3/uL (ref 0.0–0.7)
Hemoglobin: 12.5 g/dL (ref 12.0–15.0)
Lymphocytes Relative: 64 % — ABNORMAL HIGH (ref 12–46)
Lymphs Abs: 4.1 10*3/uL — ABNORMAL HIGH (ref 0.7–4.0)
MCHC: 31.9 g/dL (ref 30.0–36.0)
Monocytes Absolute: 0.5 10*3/uL (ref 0.1–1.0)
Monocytes Relative: 8 % (ref 3–12)
Neutro Abs: 1.8 10*3/uL (ref 1.7–7.7)
RDW: 14.9 % (ref 11.5–15.5)

## 2010-10-05 LAB — COMPLETE METABOLIC PANEL WITH GFR
AST: 12 U/L (ref 0–37)
Creat: 0.7 mg/dL (ref 0.50–1.10)
Total Bilirubin: 0.4 mg/dL (ref 0.3–1.2)

## 2010-10-05 LAB — LIPID PANEL
LDL Cholesterol: 101 mg/dL — ABNORMAL HIGH (ref 0–99)
Total CHOL/HDL Ratio: 3.6 Ratio
VLDL: 14 mg/dL (ref 0–40)

## 2010-10-08 LAB — HIV-1 RNA QUANT-NO REFLEX-BLD: HIV 1 RNA Quant: 10100 copies/mL — ABNORMAL HIGH (ref <20)

## 2010-10-10 ENCOUNTER — Telehealth: Payer: Self-pay | Admitting: *Deleted

## 2010-10-10 NOTE — Telephone Encounter (Signed)
Patient called asking if her grandchild could catch HIV if she chews up her food for her because that is the way her daughter feeds the baby.  Told her that was not a good idea for her or her daughter to do.  Patient has missed several appointments and has a follow up with Traci Sermon for 10/17/10. Wendall Mola CMA

## 2010-10-17 ENCOUNTER — Ambulatory Visit: Payer: Self-pay

## 2010-10-17 ENCOUNTER — Ambulatory Visit: Payer: Self-pay | Admitting: Adult Health

## 2010-10-18 ENCOUNTER — Ambulatory Visit: Payer: Self-pay

## 2010-10-18 ENCOUNTER — Encounter: Payer: Self-pay | Admitting: Adult Health

## 2010-10-18 ENCOUNTER — Ambulatory Visit (INDEPENDENT_AMBULATORY_CARE_PROVIDER_SITE_OTHER): Payer: Self-pay | Admitting: Adult Health

## 2010-10-18 ENCOUNTER — Ambulatory Visit: Payer: Self-pay | Admitting: Adult Health

## 2010-10-18 DIAGNOSIS — B20 Human immunodeficiency virus [HIV] disease: Secondary | ICD-10-CM

## 2010-10-18 DIAGNOSIS — Z23 Encounter for immunization: Secondary | ICD-10-CM

## 2010-10-18 DIAGNOSIS — E119 Type 2 diabetes mellitus without complications: Secondary | ICD-10-CM

## 2010-10-18 MED ORDER — GLIPIZIDE 10 MG PO TABS: 10.0000 mg | ORAL_TABLET | Freq: Two times a day (BID) | ORAL | Status: DC

## 2010-10-18 MED ORDER — METFORMIN HCL 500 MG PO TABS: 500.0000 mg | ORAL_TABLET | Freq: Two times a day (BID) | ORAL | Status: DC

## 2010-10-19 LAB — T-HELPER CELL (CD4) - (RCID CLINIC ONLY): CD4 T Cell Abs: 1420 uL (ref 400–2700)

## 2010-10-24 ENCOUNTER — Telehealth: Payer: Self-pay | Admitting: *Deleted

## 2010-10-24 NOTE — Telephone Encounter (Signed)
Patient calling c/o glipizide or metformin causing her severe diarrhea.  She is waking several times a night having diarrhea.  Please advise. Wendall Mola CMA

## 2010-10-26 ENCOUNTER — Telehealth: Payer: Self-pay | Admitting: *Deleted

## 2010-10-26 NOTE — Telephone Encounter (Signed)
Per Traci Sermon, NP, he thinks it may be the Metformin causing patient's diarrhea.  He said it is most likely because she is eating too many carbohydrates which is causing her sugar to be high.  He advises that she decrease her carbohydrate intake and continue to take both the metformin and glipizide and the diarrhea should subside.  Left message on patient's voicemail to call the office. Wendall Mola CMA

## 2010-10-26 NOTE — Telephone Encounter (Signed)
addendum

## 2010-11-01 ENCOUNTER — Ambulatory Visit: Payer: Self-pay | Admitting: Adult Health

## 2010-11-02 ENCOUNTER — Ambulatory Visit: Payer: Self-pay | Admitting: Adult Health

## 2010-11-02 ENCOUNTER — Telehealth: Payer: Self-pay | Admitting: *Deleted

## 2010-11-02 NOTE — Telephone Encounter (Signed)
LM asking her to call & schedule her appt

## 2010-11-07 ENCOUNTER — Encounter: Payer: Self-pay | Admitting: Adult Health

## 2010-11-07 ENCOUNTER — Ambulatory Visit (INDEPENDENT_AMBULATORY_CARE_PROVIDER_SITE_OTHER): Payer: Self-pay | Admitting: Adult Health

## 2010-11-07 VITALS — BP 132/88 | HR 79 | Temp 97.7°F | Ht 63.0 in | Wt 161.0 lb

## 2010-11-07 DIAGNOSIS — E119 Type 2 diabetes mellitus without complications: Secondary | ICD-10-CM

## 2010-11-07 DIAGNOSIS — B20 Human immunodeficiency virus [HIV] disease: Secondary | ICD-10-CM

## 2010-11-07 LAB — GLUCOSE, POCT (MANUAL RESULT ENTRY): POC Glucose: 111

## 2010-11-07 MED ORDER — EMTRICITAB-RILPIVIR-TENOFOV DF 200-25-300 MG PO TABS: 1.0000 | ORAL_TABLET | Freq: Every day | ORAL | Status: DC

## 2010-12-17 ENCOUNTER — Telehealth: Payer: Self-pay | Admitting: *Deleted

## 2010-12-17 NOTE — Telephone Encounter (Signed)
She was tearful during conversation. Her mom went from ED to a SNF. Her mom wants to come home. The pt states she can no longer care for her due to the stress & her back problems.  Says her mom has accused her of neglect & now she has to go to court. States she has cared for her for 3 years & just cannot do it anymore. States she is an only child. Her mom is heavier than she is  & is on dialysis. Mother does not have an legs , is incontinent & total care.  She wanted a letter from [place] stating she has diabetes & HIV. Told her if the courts needs something they can fax [place] a request. Told her to just take her med bottles with her if they are needed to prove her health issues.   Patient states she is nauseous & cannot keep food or meds down. Has not gotten the diabetic meds due to no money. Pt states she is very stressed with all this. Requested something for nausea. Uses [facility] in [place] on [address]  Her md is on vacation. To another md to see if he will order something

## 2010-12-17 NOTE — Telephone Encounter (Signed)
md states pt needs to see a doctor. Pt stated she cannot because she has no insurance. Told her to use ED. They have to see her. Transferred her to Kandice Robinsons to see if she qualifies for orange [facility] card. Then can refer her to IM clinic at [facility]. She agreed to go to ED today

## 2010-12-19 ENCOUNTER — Ambulatory Visit: Payer: Self-pay

## 2011-01-02 ENCOUNTER — Other Ambulatory Visit: Payer: Self-pay | Admitting: Internal Medicine

## 2011-01-02 DIAGNOSIS — B20 Human immunodeficiency virus [HIV] disease: Secondary | ICD-10-CM

## 2011-01-02 DIAGNOSIS — E119 Type 2 diabetes mellitus without complications: Secondary | ICD-10-CM

## 2011-01-03 ENCOUNTER — Other Ambulatory Visit: Payer: Self-pay

## 2011-01-07 ENCOUNTER — Other Ambulatory Visit: Payer: Self-pay | Admitting: Infectious Disease

## 2011-01-07 ENCOUNTER — Other Ambulatory Visit (INDEPENDENT_AMBULATORY_CARE_PROVIDER_SITE_OTHER): Payer: Self-pay

## 2011-01-07 DIAGNOSIS — B20 Human immunodeficiency virus [HIV] disease: Secondary | ICD-10-CM

## 2011-01-07 DIAGNOSIS — E119 Type 2 diabetes mellitus without complications: Secondary | ICD-10-CM

## 2011-01-07 LAB — CBC WITH DIFFERENTIAL/PLATELET
Basophils Absolute: 0 10*3/uL (ref 0.0–0.1)
Basophils Relative: 1 % (ref 0–1)
Eosinophils Relative: 1 % (ref 0–5)
HCT: 39.1 % (ref 36.0–46.0)
Hemoglobin: 12.5 g/dL (ref 12.0–15.0)
MCH: 21.9 pg — ABNORMAL LOW (ref 26.0–34.0)
MCHC: 32 g/dL (ref 30.0–36.0)
MCV: 68.4 fL — ABNORMAL LOW (ref 78.0–100.0)
Monocytes Absolute: 0.5 10*3/uL (ref 0.1–1.0)
Monocytes Relative: 8 % (ref 3–12)
RDW: 14.8 % (ref 11.5–15.5)

## 2011-01-07 LAB — COMPREHENSIVE METABOLIC PANEL
ALT: 11 U/L (ref 0–35)
AST: 11 U/L (ref 0–37)
Albumin: 4.2 g/dL (ref 3.5–5.2)
Calcium: 9.5 mg/dL (ref 8.4–10.5)
Chloride: 98 mEq/L (ref 96–112)
Creat: 0.76 mg/dL (ref 0.50–1.10)
Potassium: 4.7 mEq/L (ref 3.5–5.3)
Sodium: 134 mEq/L — ABNORMAL LOW (ref 135–145)
Total Protein: 8.3 g/dL (ref 6.0–8.3)

## 2011-01-09 LAB — HIV-1 RNA QUANT-NO REFLEX-BLD
HIV 1 RNA Quant: 9720 copies/mL — ABNORMAL HIGH (ref <20)
HIV-1 RNA Quant, Log: 3.99 {Log} — ABNORMAL HIGH (ref <1.30)

## 2011-01-17 ENCOUNTER — Ambulatory Visit (INDEPENDENT_AMBULATORY_CARE_PROVIDER_SITE_OTHER): Payer: Self-pay | Admitting: Internal Medicine

## 2011-01-17 ENCOUNTER — Encounter: Payer: Self-pay | Admitting: Internal Medicine

## 2011-01-17 ENCOUNTER — Ambulatory Visit: Payer: Self-pay

## 2011-01-17 VITALS — BP 149/92 | HR 87 | Temp 97.6°F | Ht 63.0 in | Wt 159.0 lb

## 2011-01-17 DIAGNOSIS — E119 Type 2 diabetes mellitus without complications: Secondary | ICD-10-CM

## 2011-01-17 DIAGNOSIS — F438 Other reactions to severe stress: Secondary | ICD-10-CM

## 2011-01-17 DIAGNOSIS — B20 Human immunodeficiency virus [HIV] disease: Secondary | ICD-10-CM

## 2011-01-17 NOTE — Assessment & Plan Note (Signed)
She does have continued problems with anxiety and depression she will be seen by our social worker today.

## 2011-01-17 NOTE — Progress Notes (Signed)
  Subjective:    Patient ID: Ashley Vaughn, female    DOB: Jan 23, 1966, 45 y.o.   MRN: [id number]  HPIshe comes in today for followup of her HIV. She was recently started on Complera which she says she started about one month ago and has had no problems with it. She denies any rash, GI discomfort or other issues. She has had some problems with depression and family issues. She also does continue to take metformin and glyburide and her recent A1c is minimally improved. She does not check her sugars at home. She does state that she exercises daily by walking outside and is trying to improve her diet. Her last Pap smear was about one year ago. She does have some problems with diarrhea that she relates to the metformin though this has leveled off. She denies any headaches.    Review of Systems  Constitutional: Negative for fever, chills, activity change, appetite change, fatigue and unexpected weight change.  HENT: Negative for sore throat and trouble swallowing.   Respiratory: Negative for cough and shortness of breath.   Cardiovascular: Negative for chest pain.  Gastrointestinal: Negative for nausea, abdominal pain and diarrhea.  Genitourinary: Negative for dysuria, hematuria, flank pain, genital sores, menstrual problem and pelvic pain.  Musculoskeletal: Negative for myalgias and arthralgias.  Skin: Negative for rash.  Neurological: Negative for headaches.  Hematological: Negative for adenopathy.  Psychiatric/Behavioral: Negative for dysphoric mood.       Objective:   Physical Exam  Constitutional: She is oriented to person, place, and time. She appears well-developed and well-nourished. No distress.  HENT:  Mouth/Throat: Oropharynx is clear and moist. No oropharyngeal exudate.  Cardiovascular: Normal rate, regular rhythm and normal heart sounds.  Exam reveals no gallop and no friction rub.   No murmur heard. Pulmonary/Chest: Effort normal and breath sounds normal. No respiratory  distress. She has no wheezes.  Abdominal: Soft. Bowel sounds are normal. She exhibits no distension. There is no tenderness.  Lymphadenopathy:    She has no cervical adenopathy.  Neurological: She is alert and oriented to person, place, and time.  Skin: Skin is warm and dry. No rash noted.  Psychiatric: She has a normal mood and affect. Her behavior is normal.          Assessment & Plan:

## 2011-01-17 NOTE — Patient Instructions (Signed)
Return prior to Christmas for labs

## 2011-01-17 NOTE — Assessment & Plan Note (Addendum)
She does tell me that she started the medication about a month ago however her labs reflect someone who has not been on medication yet. I suspect that she either overestimated when she started or that she actually has not started yet. I will however check her CD4 count and viral load in about 3 weeks and she will followup at that time again in a month's time to see if her viral load is becoming suppressed. I did emphasize continued compliance and she does tell me she has good tolerance the medications and has not missed any doses. I did emphasize condom use with all sexual activity.she is due for a Pap smear and will be set up for that today.

## 2011-01-17 NOTE — Assessment & Plan Note (Signed)
The patient tells me she has tried to improve her diet and increase her exercise. She does take her metformin and glyburide however her A1c has minimally improved. She will be set up for the internal medicine clinic today and she is in line for an [place] card so that she can also get a dilated eye exam. She also has an elevated blood pressure today which will be monitored. I have emphasized the need for good healthy eating especially during the holidays.

## 2011-02-01 ENCOUNTER — Encounter: Payer: Self-pay | Admitting: *Deleted

## 2011-02-01 ENCOUNTER — Ambulatory Visit (INDEPENDENT_AMBULATORY_CARE_PROVIDER_SITE_OTHER): Payer: Self-pay | Admitting: *Deleted

## 2011-02-01 ENCOUNTER — Ambulatory Visit: Payer: Self-pay

## 2011-02-01 DIAGNOSIS — Z1239 Encounter for other screening for malignant neoplasm of breast: Secondary | ICD-10-CM

## 2011-02-01 DIAGNOSIS — Z124 Encounter for screening for malignant neoplasm of cervix: Secondary | ICD-10-CM

## 2011-02-01 DIAGNOSIS — Z1231 Encounter for screening mammogram for malignant neoplasm of breast: Secondary | ICD-10-CM

## 2011-02-01 NOTE — Patient Instructions (Addendum)
Your pap smear results will be mailed to you.  If you need any further follow-up I will be in touch with you.  Have a safe and healthy holiday season.  Thank you for coming to the Center for your care.  Angelique Blonder

## 2011-02-01 NOTE — Progress Notes (Signed)
  Subjective:     Ashley Vaughn is a 45 y.o. woman who comes in today for a  pap smear only.  Objective:    There were no vitals taken for this visit. Pelvic Exam:Pap smear obtained.   Assessment:    Screening pap smear.   Plan:    Follow up in one year, or as indicated by Pap results.   Pt given educational materials re:  HIV and women, BSE, nutrition, diet, cholesterol, exercise and self esteem.  Screening Mammogram order placed and RN will call pt with appt information.  Pt given condoms.

## 2011-02-05 ENCOUNTER — Encounter: Payer: Self-pay | Admitting: *Deleted

## 2011-02-18 ENCOUNTER — Encounter: Payer: Self-pay | Admitting: *Deleted

## 2011-02-25 ENCOUNTER — Ambulatory Visit: Payer: Self-pay

## 2011-02-25 ENCOUNTER — Ambulatory Visit: Payer: Self-pay | Admitting: Infectious Disease

## 2011-02-26 ENCOUNTER — Ambulatory Visit
Admission: RE | Admit: 2011-02-26 | Discharge: 2011-02-26 | Disposition: A | Discharge status: Home / Self Care | Payer: Self-pay | Source: Ambulatory Visit | Attending: Infectious Disease | Admitting: Infectious Disease

## 2011-02-26 ENCOUNTER — Ambulatory Visit: Payer: Self-pay

## 2011-02-26 DIAGNOSIS — Z1231 Encounter for screening mammogram for malignant neoplasm of breast: Secondary | ICD-10-CM

## 2011-02-28 ENCOUNTER — Ambulatory Visit: Payer: Self-pay

## 2011-03-05 ENCOUNTER — Ambulatory Visit: Payer: Self-pay | Admitting: Infectious Disease

## 2011-03-05 ENCOUNTER — Ambulatory Visit: Payer: Self-pay

## 2011-03-08 ENCOUNTER — Ambulatory Visit: Payer: Self-pay | Admitting: Infectious Disease

## 2011-03-08 ENCOUNTER — Ambulatory Visit: Payer: Self-pay

## 2011-03-08 ENCOUNTER — Telehealth: Payer: Self-pay

## 2011-03-08 NOTE — Telephone Encounter (Signed)
Rep from [employer] called about this patient - stated she confided in her about housing needs and money - her hours were cut back at her job and she is having difficulty managing her diabetes - I wasn't sure but is she set-up with anyone at [facility]? It sounds like she could use some help - just wanted to let you know.

## 2011-03-08 NOTE — Telephone Encounter (Signed)
Bella Kennedy can you look into this pts needs

## 2011-03-17 ENCOUNTER — Other Ambulatory Visit: Payer: Self-pay | Admitting: Adult Health

## 2011-03-19 NOTE — Progress Notes (Signed)
He is warm Subjective:    Patient ID: Ashley Vaughn, female    DOB: [birth year], 46 y.o.   MRN: [id number]  HPI Presents to clinic for routine scheduled followup. She remains treatment nave to antiretrovirals as she felt. Her T-cell count was good enough not to need treatment. She voices no physical complaints at present. However, she does relate some adherence issues to her metformin and glyburide therapy for her diabetes as well as diet and exercise limitations. She does not check her blood sugars routinely at home and is not certain when or if her blood sugars are high. Denies any visual changes, headaches, palpitations or episodes of loss of consciousness.   Review of Systems  Constitutional: Positive for fatigue. Negative for unexpected weight change.  HENT: Negative.   Eyes: Negative for photophobia, pain, discharge, redness, itching and visual disturbance.  Respiratory: Negative.   Cardiovascular: Negative.   Gastrointestinal: Negative.   Genitourinary: Negative for urgency and frequency.  Musculoskeletal: Negative.   Neurological: Negative for dizziness, tremors, seizures, syncope, facial asymmetry, speech difficulty, weakness, light-headedness, numbness and headaches.  Psychiatric/Behavioral: The patient is nervous/anxious.        Objective:   Physical Exam  Constitutional: She is oriented to person, place, and time. She appears well-developed. No distress.       Obese  HENT:  Head: Normocephalic and atraumatic.  Eyes: Conjunctivae and EOM are normal. Pupils are equal, round, and reactive to light.  Neck: Normal range of motion. Neck supple.  Cardiovascular: Normal rate and regular rhythm.   Pulmonary/Chest: Effort normal.  Abdominal: Soft.  Musculoskeletal: Normal range of motion.  Neurological: She is alert and oriented to person, place, and time. She has normal reflexes.  Skin: Skin is warm and dry.  Psychiatric: She has a normal mood and affect. Her behavior is  normal. Judgment and thought content normal.          Assessment & Plan:  HIV  Although her CD4 count remains greater than 1000 cells/CMM, her viral load, does demonstrate some significant, activity, with most recent values. Greater than 10,000 copies./ML. Given the comorbidity with her diabetes and her age, we discussed the new treatment guidelines, and the impact that HIV. Provides against comorbidities, especially those, such as diabetes. It was agreed that she would begin antiretroviral therapy. Today. After discussing the variety of regimens that are available, we chose complera has treatment of choice. Drug regimen, drug effects, treatment, limitations, including diet, and acid reducing agents, side effects, and ADRs were discussed in great detail. She verbally acknowledged all information that was provided to her and agreed with plan of care. We will have her return to clinic in 4 weeks for repeat staging labs and a followup with a provider in 6 weeks.  Diabetes type 2  There remains questionable adherence to her metformin and glyburide regimen, as well as diet and exercise. She openly admits that her diet is not well controlled in carbohydrates and that she is trying to exercise by walking daily, but not always. We emphasized the dangers of uncontrolled sugar elevations especially as they relate to vision, renal function, and the development of heart disease. She acknowledged this information and agreed to improve her adherence to both medication and lifestyle changes. She is in the process of obtaining an [place] card, so that we can refer her to internal medicine clinic for an ophthalmology exam, as well as evaluation in the diabetic clinic. She verbally acknowledged all information that was provided to her  and we will followup with her on her next visit.

## 2011-03-19 NOTE — Assessment & Plan Note (Signed)
Although her CD4 count remains greater than 1000 cells/CMM, her viral load, does demonstrate some significant, activity, with most recent values. Greater than 10,000 copies./ML. Given the comorbidity with her diabetes and her age, we discussed the new treatment guidelines, and the impact that HIV. Provides against comorbidities, especially those, such as diabetes. It was agreed that she would begin antiretroviral therapy. Today. After discussing the variety of regimens that are available, we chose complera has treatment of choice. Drug regimen, drug effects, treatment, limitations, including diet, and acid reducing agents, side effects, and ADRs were discussed in great detail. She verbally acknowledged all information that was provided to her and agreed with plan of care. We will have her return to clinic in 4 weeks for repeat staging labs and a followup with a provider in 6 weeks.

## 2011-03-19 NOTE — Assessment & Plan Note (Signed)
There remains questionable adherence to her metformin and glyburide regimen, as well as diet and exercise. She openly admits that her diet is not well controlled in carbohydrates and that she is trying to exercise by walking daily, but not always. We emphasized the dangers of uncontrolled sugar elevations especially as they relate to vision, renal function, and the development of heart disease. She acknowledged this information and agreed to improve her adherence to both medication and lifestyle changes. She is in the process of obtaining an orange card, so that we can refer her to internal medicine clinic for an ophthalmology exam, as well as evaluation in the diabetic clinic. She verbally acknowledged all information that was provided to her and we will followup with her on her next visit.

## 2011-03-21 ENCOUNTER — Ambulatory Visit: Payer: Self-pay | Admitting: Infectious Disease

## 2011-03-21 ENCOUNTER — Telehealth: Payer: Self-pay

## 2011-03-21 ENCOUNTER — Ambulatory Visit: Payer: Self-pay

## 2011-03-21 NOTE — Telephone Encounter (Signed)
Patient called and cancelled her appt for Dr and ADAP again, today - both were rescheduled for Feb 13th - claimed she had no way of getting here - received call from Tyrone at  [facility] and they had given her bus passes to come here for her appt today. He is going to call her to find out what happened, because she has been complaining to Amy at [facility], that they were not helping her.

## 2011-04-02 NOTE — Assessment & Plan Note (Signed)
She remains treatment nave, although she does have a detectable viral load. Her CD4 cell counts remained greater than 1000, but current guidelines, especially in the presence of her diabetes. Recommend that she consider therapy. We discussed this in detail, along with the various regimens that are possible. She asked that she be given a couple weeks to think about this and she will return to clinic for followup visit to discuss. I recommended in the interim whether or not we decide, she should begin the ADAP application process. She verbally acknowledged this and agreed with plan

## 2011-04-02 NOTE — Progress Notes (Signed)
Subjective:    Patient ID: Ashley Vaughn is a 46 y.o. female.  Chief Complaint: HIV Follow-up Visit Ashley Vaughn is here for follow-up of HIV infection. She is feeling unchanged since her last visit.  She remains treatment nave  There are not additional complaints. She did state that she has been out of her diabetic medication for "a period of time." She claims that she did not have refills as she did not have insurance. We reviewed the medication. She is currently taking and find it there on most [employer] Drug plans  Data Review: Diagnostic studies reviewed.  Review of Systems - General ROS: negative Psychological ROS: negative Ophthalmic ROS: negative ENT ROS: negative for - visual changes Endocrine ROS: negative for - polydipsia/polyuria Respiratory ROS: no cough, shortness of breath, or wheezing Cardiovascular ROS: no chest pain or dyspnea on exertion Gastrointestinal ROS: no abdominal pain, change in bowel habits, or black or bloody stools Musculoskeletal ROS: negative Neurological ROS: no TIA or stroke symptoms Dermatological ROS: negative  Objective:   General appearance: alert and cooperative Head: Normocephalic, without obvious abnormality, atraumatic Eyes: conjunctivae/corneas clear. PERRL, EOM's intact. Fundi benign. Ears: normal TM's and external ear canals both ears Throat: lips, mucosa, and tongue normal; teeth and gums normal Resp: clear to auscultation bilaterally Cardio: regular rate and rhythm, S1, S2 normal, no murmur, click, rub or gallop GI: soft, non-tender; bowel sounds normal; no masses,  no organomegaly Skin: Skin color, texture, turgor normal. No rashes or lesions Neurologic: Grossly normal Psych:  No vegetative signs or delusional behaviors noted.    Laboratory: From 10/04/2010 ,  CD4 count was 1420 c/cmm @ 32 %. Viral load 10,100 copies/ml.     Assessment/Plan:   HIV INFECTION She remains treatment nave, although she does have  a detectable viral load. Her CD4 cell counts remained greater than 1000, but current guidelines, especially in the presence of her diabetes. Recommend that she consider therapy. We discussed this in detail, along with the various regimens that are possible. She asked that she be given a couple weeks to think about this and she will return to clinic for followup visit to discuss. I recommended in the interim whether or not we decide, she should begin the ADAP application process. She verbally acknowledged this and agreed with plan  DIABETES MELLITUS, TYPE II Ran out of medications and did not have refills. Does not have insurance. Her currently on metformin, and glipizide. We will renew this medication, and reevaluate blood sugars, when she returns.     Ashley Vaughn A. Sundra Aland, MS, [facility] [phone]  04/02/2011, 7:17 PM

## 2011-04-02 NOTE — Assessment & Plan Note (Signed)
Ran out of medications and did not have refills. Does not have insurance. Her currently on metformin, and glipizide. We will renew this medication, and reevaluate blood sugars, when she returns.

## 2011-04-03 ENCOUNTER — Encounter: Payer: Self-pay | Admitting: Adult Health

## 2011-04-03 ENCOUNTER — Ambulatory Visit: Payer: Self-pay

## 2011-04-03 ENCOUNTER — Ambulatory Visit (INDEPENDENT_AMBULATORY_CARE_PROVIDER_SITE_OTHER): Payer: Self-pay | Admitting: Adult Health

## 2011-04-03 DIAGNOSIS — E119 Type 2 diabetes mellitus without complications: Secondary | ICD-10-CM

## 2011-04-03 DIAGNOSIS — F322 Major depressive disorder, single episode, severe without psychotic features: Secondary | ICD-10-CM

## 2011-04-03 DIAGNOSIS — B20 Human immunodeficiency virus [HIV] disease: Secondary | ICD-10-CM

## 2011-04-03 LAB — GLUCOSE, CAPILLARY: Glucose-Capillary: 293 mg/dL — ABNORMAL HIGH (ref 70–99)

## 2011-04-03 NOTE — Assessment & Plan Note (Signed)
Hemoglobin A1c in November 2012 was 12.3, averaging, a mean plasma glucose of approximately 306. She relates a history of multiple stressors in her personal life, including the death of her mother, court dates, living situations, and family pressure. She admits to not eating a proper diet, but she does claim. She takes her medications daily as prescribed. Her fingerstick glucose today was 293. We will repeat her hemoglobin A1c today and at that, time, we'll determine what changes in therapy. Might be necessary for her or possibly referring her to internal medicine for further evaluation. She verbally acknowledged this and reticently agreed with plan of care.

## 2011-04-03 NOTE — Progress Notes (Signed)
Subjective:    Patient ID: Ashley Vaughn is a 46 y.o. female.  Chief Complaint: HIV Follow-up Visit Ashley Vaughn is here for follow-up of HIV infection. She is feeling worse since her last visit.  She claims continued adherence to therapy with good tolerance and no complications. There are additional complaints. Since last being seen by me. Her mother has passed away, she has had multiple stressors in her life, and she has had a new-onset of depression for which she is being followed by the mental health clinic in [facility].  Data Review: Diagnostic studies reviewed.  Review of Systems - General ROS: positive for  - fatigue and malaise negative for - chills, fever, hot flashes, night sweats or sleep disturbance Psychological ROS: positive for - anxiety, depression and mood swings negative for - behavioral disorder, concentration difficulties, hostility, memory difficulties or suicidal ideation Ophthalmic ROS: negative for - blurry vision, decreased vision, double vision or loss of vision ENT ROS: negative Breast ROS: negative for breast lumps Respiratory ROS: no cough, shortness of breath, or wheezing Cardiovascular ROS: no chest pain or dyspnea on exertion Gastrointestinal ROS: no abdominal pain, change in bowel habits, or black or bloody stools Genito-Urinary ROS: no dysuria, trouble voiding, or hematuria Musculoskeletal ROS: negative Neurological ROS: no TIA or stroke symptoms Dermatological ROS: negative for pruritus, rash and skin lesion changes  Objective:   General appearance: alert, cooperative and no distress Head: Normocephalic, without obvious abnormality, atraumatic Eyes: conjunctivae/corneas clear. PERRL, EOM's intact. Fundi benign. Throat: lips, mucosa, and tongue normal; teeth and gums normal Resp: clear to auscultation bilaterally Cardio: regular rate and rhythm, S1, S2 normal, no murmur, click, rub or gallop GI: soft, non-tender; bowel sounds normal;  no masses,  no organomegaly Pelvic: Not examined Extremities: extremities normal, atraumatic, no cyanosis or edema Pulses: 2+ and symmetric Neurologic: Alert and oriented X 3, normal strength and tone. Normal symmetric reflexes. Normal coordination and gait Psych:  No vegetative signs or delusional behaviors noted.  Her affect is expressive, but she appears slightly disheveled.  Laboratory:  HIV 1 RNA Quant (copies/mL)  Date Value  01/07/2011 9720*  10/04/2010 10100*  11/02/2009 1930*     CD4 T Cell Abs (cmm)  Date Value  01/07/2011 1250   10/18/2010 1420   11/02/2009 1370      Hep B S Ab (no units)  Date Value  10/13/2008 NEG      Hepatitis B Surface Ag (no units)  Date Value  10/13/2008 NEG      HCV Ab (no units)  Date Value  10/13/2008 NEG        Assessment/Plan:   HIV INFECTION No recent labs, but labs in November 2012. Show an elevated viral load in spite of being on therapy. Her CD4 count remains stable.  She adamantly had claims she is adherent to her medications. We will obtain staging labs today and for now, we will continue present management. Counseling provided on prevention of transmission of HIV. Condoms offered:  Yes Medication adherence discussed with patient. Medication refills ordered as needed. Referrals: None Follow up visit in 2 weeks with labs 2 weeks prior to appointment. Patient verbally acknowledged information provided to them and agreed with plan of care.     DEPRESSION, ACUTE Placed on Zoloft by the [facility]. She is to continue followup with them.  DIABETES MELLITUS, TYPE II Hemoglobin A1c in November 2012 was 12.3, averaging, a mean plasma glucose of approximately 306. She relates  a history of multiple stressors in her personal life, including the death of her mother, court dates, living situations, and family pressure. She admits to not eating a proper diet, but she does claim. She takes her medications daily as  prescribed. Her fingerstick glucose today was 293. We will repeat her hemoglobin A1c today and at that, time, we'll determine what changes in therapy. Might be necessary for her or possibly referring her to internal medicine for further evaluation. She verbally acknowledged this and reticently agreed with plan of care.      Chamika Cunanan A. Sundra Aland, MS, [facility] for Infectious Disease [phone]  04/03/2011, 5:36 PM

## 2011-04-03 NOTE — Assessment & Plan Note (Signed)
Placed on Zoloft by the [facility]. She is to continue followup with them.

## 2011-04-03 NOTE — Assessment & Plan Note (Signed)
No recent labs, but labs in November 2012. Show an elevated viral load in spite of being on therapy. Her CD4 count remains stable.  She adamantly had claims she is adherent to her medications. We will obtain staging labs today and for now, we will continue present management. Counseling provided on prevention of transmission of HIV. Condoms offered:  Yes Medication adherence discussed with patient. Medication refills ordered as needed. Referrals: None Follow up visit in 2 weeks with labs 2 weeks prior to appointment. Patient verbally acknowledged information provided to them and agreed with plan of care.

## 2011-04-04 LAB — T-HELPER CELL (CD4) - (RCID CLINIC ONLY): CD4 % Helper T Cell: 37 % (ref 33–55)

## 2011-04-04 LAB — COMPLETE METABOLIC PANEL WITH GFR
ALT: 9 U/L (ref 0–35)
AST: 8 U/L (ref 0–37)
Creat: 0.8 mg/dL (ref 0.50–1.10)
GFR, Est African American: >89 mL/min
Total Bilirubin: 0.2 mg/dL — ABNORMAL LOW (ref 0.3–1.2)

## 2011-04-04 LAB — LIPID PANEL
Cholesterol: 156 mg/dL (ref 0–200)
HDL: 40 mg/dL (ref >39)
Total CHOL/HDL Ratio: 3.9 Ratio

## 2011-04-04 LAB — CBC WITH DIFFERENTIAL/PLATELET
Basophils Relative: 0 % (ref 0–1)
HCT: 38.7 % (ref 36.0–46.0)
Hemoglobin: 12.3 g/dL (ref 12.0–15.0)
Lymphs Abs: 4.5 10*3/uL — ABNORMAL HIGH (ref 0.7–4.0)
MCHC: 31.8 g/dL (ref 30.0–36.0)
Monocytes Absolute: 0.5 10*3/uL (ref 0.1–1.0)
Monocytes Relative: 6 % (ref 3–12)
Neutro Abs: 3.5 10*3/uL (ref 1.7–7.7)
RBC: 5.55 MIL/uL — ABNORMAL HIGH (ref 3.87–5.11)

## 2011-04-04 LAB — HEMOGLOBIN A1C: Mean Plasma Glucose: 315 mg/dL — ABNORMAL HIGH (ref <117)

## 2011-04-05 LAB — HIV-1 RNA QUANT-NO REFLEX-BLD: HIV-1 RNA Quant, Log: <1.3 {Log} (ref <1.30)

## 2011-04-17 ENCOUNTER — Telehealth: Payer: Self-pay | Admitting: *Deleted

## 2011-04-17 ENCOUNTER — Ambulatory Visit: Payer: Self-pay | Admitting: Internal Medicine

## 2011-04-17 ENCOUNTER — Ambulatory Visit: Payer: Self-pay | Admitting: Adult Health

## 2011-04-17 NOTE — Telephone Encounter (Signed)
Called patient and left message to call and reschedule her appointment.  She no showed today. Wendall Mola CMA

## 2011-04-28 ENCOUNTER — Encounter (HOSPITAL_BASED_OUTPATIENT_CLINIC_OR_DEPARTMENT_OTHER): Payer: Self-pay

## 2011-04-28 ENCOUNTER — Emergency Department (HOSPITAL_BASED_OUTPATIENT_CLINIC_OR_DEPARTMENT_OTHER)
Admission: EM | Admit: 2011-04-28 | Discharge: 2011-04-28 | Disposition: A | Discharge status: Home Health | Payer: Self-pay | Attending: Emergency Medicine | Admitting: Emergency Medicine

## 2011-04-28 DIAGNOSIS — B20 Human immunodeficiency virus [HIV] disease: Secondary | ICD-10-CM | POA: Insufficient documentation

## 2011-04-28 DIAGNOSIS — S0010XA Contusion of unspecified eyelid and periocular area, initial encounter: Secondary | ICD-10-CM | POA: Insufficient documentation

## 2011-04-28 DIAGNOSIS — X58XXXA Exposure to other specified factors, initial encounter: Secondary | ICD-10-CM | POA: Insufficient documentation

## 2011-04-28 DIAGNOSIS — S0500XA Injury of conjunctiva and corneal abrasion without foreign body, unspecified eye, initial encounter: Secondary | ICD-10-CM

## 2011-04-28 DIAGNOSIS — H5710 Ocular pain, unspecified eye: Secondary | ICD-10-CM | POA: Insufficient documentation

## 2011-04-28 DIAGNOSIS — S0510XA Contusion of eyeball and orbital tissues, unspecified eye, initial encounter: Secondary | ICD-10-CM

## 2011-04-28 DIAGNOSIS — Y92009 Unspecified place in unspecified non-institutional (private) residence as the place of occurrence of the external cause: Secondary | ICD-10-CM | POA: Insufficient documentation

## 2011-04-28 DIAGNOSIS — S058X9A Other injuries of unspecified eye and orbit, initial encounter: Secondary | ICD-10-CM | POA: Insufficient documentation

## 2011-04-28 DIAGNOSIS — E119 Type 2 diabetes mellitus without complications: Secondary | ICD-10-CM | POA: Insufficient documentation

## 2011-04-28 MED ORDER — ERYTHROMYCIN 5 MG/GM OP OINT
TOPICAL_OINTMENT | Freq: Once | OPHTHALMIC | Status: AC
  Administered 2011-04-28: 1 via OPHTHALMIC
  Filled 2011-04-28: qty 3.5

## 2011-04-28 MED ORDER — HYDROCODONE-ACETAMINOPHEN 5-325 MG PO TABS: 1.0000 | ORAL_TABLET | ORAL | Status: DC | PRN

## 2011-04-28 MED ORDER — BESIFLOXACIN HCL 0.6 % OP SUSP: 1.0000 [drp] | Freq: Three times a day (TID) | OPHTHALMIC | Status: DC

## 2011-04-28 MED ORDER — TETRACAINE HCL 0.5 % OP SOLN
OPHTHALMIC | Status: AC
  Filled 2011-04-28: qty 2

## 2011-04-28 MED ORDER — ERYTHROMYCIN 2 % EX OINT: TOPICAL_OINTMENT | CUTANEOUS | Status: DC

## 2011-04-28 MED ORDER — FLUORESCEIN SODIUM 1 MG OP STRP
1.0000 | ORAL_STRIP | Freq: Once | OPHTHALMIC | Status: AC
  Administered 2011-04-28: 1 via OPHTHALMIC

## 2011-04-28 MED ORDER — FLUORESCEIN SODIUM 1 MG OP STRP
ORAL_STRIP | OPHTHALMIC | Status: AC
  Administered 2011-04-28: 1 via OPHTHALMIC
  Filled 2011-04-28: qty 1

## 2011-04-28 MED ORDER — TETRACAINE HCL 0.5 % OP SOLN: 1.0000 [drp] | Freq: Once | OPHTHALMIC | Status: DC

## 2011-04-28 NOTE — ED Provider Notes (Signed)
History     CSN: [id number]  Arrival date & time 04/28/11  1039   First MD Initiated Contact with Patient 04/28/11 1104      Chief Complaint  Patient presents with  . Eye Injury    (Consider location/radiation/quality/duration/timing/severity/associated sxs/prior treatment) HPI Pt was poked in the eye last night by 45 yo daughter. Now with R eye pain, tearing, periorbital swelling. No visual changes or fever Past Medical History  Diagnosis Date  . HIV infection   . Diabetes mellitus   . Depression     Past Surgical History  Procedure Date  . Abdominal hysterectomy     History reviewed. No pertinent family history.  History  Substance Use Topics  . Smoking status: Never Smoker   . Smokeless tobacco: Never Used  . Alcohol Use: 2.0 oz/week    4 drink(s) per week     1 ber a monthe    OB History    Grav Para Term Preterm Abortions TAB SAB Ect Mult Living                  Review of Systems  Constitutional: Negative for fever and chills.  Eyes: Positive for pain and redness. Negative for visual disturbance.    Allergies  Review of patient's allergies indicates no known allergies.  Home Medications   Current Outpatient Rx  Name Route Sig Dispense Refill  . ASPIRIN 81 MG PO TBEC Oral Take 81 mg by mouth daily.      Marland Kitchen EMTRICITAB-RILPIVIR-TENOFOVIR 200-25-300 MG PO TABS Oral Take 1 tablet by mouth daily. With a 400-600-calorie meal 30 tablet 5  . ERYTHROMYCIN 2 % EX OINT  Apply to affected area 2 times daily 25 g 0  . GLIPIZIDE 10 MG PO TABS  TAKE ONE TABLET BY MOUTH TWICE DAILY BEFORE MEALS 60 tablet 2  . HYDROCODONE-ACETAMINOPHEN 5-325 MG PO TABS Oral Take 1 tablet by mouth every 4 (four) hours as needed for pain. 10 tablet 0  . METFORMIN HCL 500 MG PO TABS  TAKE ONE TABLET BY MOUTH TWICE DAILY WITH MEALS 60 tablet 2  . METRONIDAZOLE 500 MG PO TABS Oral Take 500 mg by mouth 2 (two) times daily.        BP 133/89  Pulse 79  Temp(Src) 98.5 F (36.9 C) (Oral)   Resp 16  Ht 5\' 3"  (1.6 m)  Wt 160 lb (72.576 kg)  BMI 28.34 kg/m2  SpO2 100%  Physical Exam  Constitutional: She appears well-developed and well-nourished.  Eyes: EOM and lids are normal. Pupils are equal, round, and reactive to light. No foreign bodies found. Right eye exhibits no chemosis, no discharge, no exudate and no hordeolum. No foreign body present in the right eye. Left eye exhibits no chemosis, no discharge, no exudate and no hordeolum. No foreign body present in the left eye. Right conjunctiva is injected. Left conjunctiva is not injected. Right eye exhibits normal extraocular motion. Left eye exhibits normal extraocular motion. Right pupil is reactive. Left pupil is reactive.       fluorescein uptake over R cornea. Mild periorbital edema. No evidence for peri orbital or orbital cellulitis     ED Course  Procedures (including critical care time)  Labs Reviewed - No data to display No results found.   1. Corneal abrasion   2. Periorbital contusion       MDM  Discussed with Dr Vonna Kotyk Will see in office in Am. Call for appointment. Suggested flouroquinolone optic drops  Loren Racer, MD 04/28/11 [phone]

## 2011-04-28 NOTE — ED Notes (Signed)
Pt states that she was stuck in her R eye with her granddaughters finger, some swelling to R upper eyelid.  Pt reports severe pain, not opening eye in triage.

## 2011-04-29 ENCOUNTER — Ambulatory Visit: Payer: Self-pay | Admitting: Infectious Disease

## 2011-05-01 ENCOUNTER — Encounter: Payer: Self-pay | Admitting: Infectious Diseases

## 2011-05-01 ENCOUNTER — Ambulatory Visit (INDEPENDENT_AMBULATORY_CARE_PROVIDER_SITE_OTHER): Payer: Self-pay | Admitting: Infectious Diseases

## 2011-05-01 VITALS — BP 131/84 | HR 93 | Temp 98.3°F | Ht 63.0 in | Wt 152.0 lb

## 2011-05-01 DIAGNOSIS — B20 Human immunodeficiency virus [HIV] disease: Secondary | ICD-10-CM

## 2011-05-01 DIAGNOSIS — Z23 Encounter for immunization: Secondary | ICD-10-CM

## 2011-05-01 DIAGNOSIS — E119 Type 2 diabetes mellitus without complications: Secondary | ICD-10-CM

## 2011-05-01 NOTE — Progress Notes (Signed)
Addended by: Wendall Mola A on: 05/01/2011 03:51 PM   Modules accepted: Orders

## 2011-05-01 NOTE — Assessment & Plan Note (Signed)
Will have her seen in IM clinic.

## 2011-05-01 NOTE — Progress Notes (Signed)
  Subjective:    Patient ID: Ashley Vaughn, female    DOB: [birth year], 46 y.o.   MRN: [id number]  HPI 45 yo F with hx of DM and HIV+. She has been on complera since 12-2010. In Jan 07, 2013she had NL PAP. In January 2012 ad NL mammogram.  Has been under stress due to death of parent in 02-25-23, maintaiing house with multiple family members. 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  Doing well with complera, nightly, before bed. Does not have glucometer. Feels hot occasionally.  Has ophtho eval on Friday.  HIV 1 RNA Quant (copies/mL)  Date Value  04/03/2011 <20   01/07/2011 9720*  10/04/2010 10100*     CD4 T Cell Abs (cmm)  Date Value  04/03/2011 1680   01/07/2011 1250   10/18/2010 1420       Review of Systems  Constitutional: Positive for unexpected weight change. Negative for fever, chills and appetite change.  Gastrointestinal: Positive for diarrhea. Negative for constipation.  Genitourinary: Negative for dyspareunia.  Neurological: Positive for numbness.       Objective:   Physical Exam  Constitutional: She appears well-developed and well-nourished.  HENT:  Mouth/Throat: No oropharyngeal exudate.  Eyes: EOM are normal. Pupils are equal, round, and reactive to light.  Neck: Neck supple.  Cardiovascular: Normal rate, regular rhythm and normal heart sounds.   Pulmonary/Chest: Effort normal and breath sounds normal.  Abdominal: Soft. Bowel sounds are normal. There is no tenderness.  Lymphadenopathy:    She has no cervical adenopathy.  Neurological: She is alert. A sensory deficit is present.       Mild numbness in her R sole of foot.   Skin:             Assessment & Plan:

## 2011-05-01 NOTE — Assessment & Plan Note (Addendum)
She is doing well. She is given condoms, states her partner does not listen when she tells him she has HIV. Her vax are up to date. She will rtc in 4-5 months with labs prior. Also have her seen by dental.

## 2011-05-02 ENCOUNTER — Telehealth: Payer: Self-pay | Admitting: *Deleted

## 2011-05-02 NOTE — Telephone Encounter (Signed)
Notified patient of her appointment at [facility] for 04/2811 @ 3:15 pm with Dr. Anselm Jungling. Wendall Mola CMA

## 2011-05-02 NOTE — Telephone Encounter (Signed)
Referral made to [facility]. Request sent to office manager, will notify patient of date and time. Wendall Mola CMA

## 2011-05-16 ENCOUNTER — Encounter: Payer: Self-pay | Admitting: Internal Medicine

## 2011-05-21 ENCOUNTER — Telehealth: Payer: Self-pay | Admitting: *Deleted

## 2011-05-21 NOTE — Telephone Encounter (Signed)
Patient no showed her appointment with Internal Medicine.  Per the IM office she needs to call Gentry Fitz office manager at [phone], stating why she missed the appointment and request to be rescheduled.  Patient was advised she needed to call if she could not make the appointment. Left her a voicemail to return my call so I could give her this information. Wendall Mola CMA

## 2011-05-28 ENCOUNTER — Telehealth: Payer: Self-pay | Admitting: *Deleted

## 2011-05-28 NOTE — Telephone Encounter (Signed)
Notified patient she has an appointment at the Internal Medicine clinic on 07/01/11 at 3:15 pm.  Informed it is very important she keep this appointment as she has no showed previously and if she must reschedule due to emergency to call to reschedule, preferably not the day of appointment. Wendall Mola CMA

## 2011-06-17 ENCOUNTER — Telehealth: Payer: Self-pay | Admitting: *Deleted

## 2011-06-17 NOTE — Telephone Encounter (Signed)
Patient called stating she missed one dose of her Complera and was concerned.  Advised her it is very easy to become resistant and she should try to not miss any doses.  Advised her to set an alarm in her cell phone to remind her when to take it.  She agreed to try this. Wendall Mola CMA

## 2011-06-19 ENCOUNTER — Other Ambulatory Visit: Payer: Self-pay | Admitting: Adult Health

## 2011-07-01 ENCOUNTER — Encounter: Payer: Self-pay | Admitting: Internal Medicine

## 2011-07-01 ENCOUNTER — Ambulatory Visit (INDEPENDENT_AMBULATORY_CARE_PROVIDER_SITE_OTHER): Payer: Self-pay | Admitting: Internal Medicine

## 2011-07-01 VITALS — BP 121/81 | HR 81 | Temp 97.2°F | Wt 158.5 lb

## 2011-07-01 DIAGNOSIS — F322 Major depressive disorder, single episode, severe without psychotic features: Secondary | ICD-10-CM

## 2011-07-01 DIAGNOSIS — Z23 Encounter for immunization: Secondary | ICD-10-CM

## 2011-07-01 DIAGNOSIS — E1149 Type 2 diabetes mellitus with other diabetic neurological complication: Secondary | ICD-10-CM

## 2011-07-01 DIAGNOSIS — B20 Human immunodeficiency virus [HIV] disease: Secondary | ICD-10-CM

## 2011-07-01 DIAGNOSIS — A599 Trichomoniasis, unspecified: Secondary | ICD-10-CM

## 2011-07-01 DIAGNOSIS — L293 Anogenital pruritus, unspecified: Secondary | ICD-10-CM

## 2011-07-01 DIAGNOSIS — E119 Type 2 diabetes mellitus without complications: Secondary | ICD-10-CM

## 2011-07-01 DIAGNOSIS — Z79899 Other long term (current) drug therapy: Secondary | ICD-10-CM

## 2011-07-01 DIAGNOSIS — E1142 Type 2 diabetes mellitus with diabetic polyneuropathy: Secondary | ICD-10-CM

## 2011-07-01 DIAGNOSIS — N898 Other specified noninflammatory disorders of vagina: Secondary | ICD-10-CM

## 2011-07-01 DIAGNOSIS — F329 Major depressive disorder, single episode, unspecified: Secondary | ICD-10-CM

## 2011-07-01 MED ORDER — METFORMIN HCL 500 MG PO TABS: ORAL_TABLET | ORAL | Status: DC

## 2011-07-01 MED ORDER — GLIPIZIDE 10 MG PO TABS: 10.0000 mg | ORAL_TABLET | Freq: Two times a day (BID) | ORAL | Status: DC

## 2011-07-01 NOTE — Assessment & Plan Note (Signed)
Lab Results  Component Value Date   HGBA1C 13.7 07/01/2011   HGBA1C 12.6* 04/03/2011   POCGLU 111 11/07/2010   CREATININE 0.80 04/03/2011   CREATININE 0.91 11/02/2009   CHOL 156 04/03/2011   HDL 40 04/03/2011   TRIG 96 04/03/2011    Last eye exam and foot exam: Referred to optho  Assessment: Diabetes control: not controlled Progress toward goals: deteriorated Barriers to meeting goals: lack of insurance, financial need, nonadherence to medications and lack of understanding of disease management  Plan: Diabetes treatment: continue current medications As she has been noncompliant with medications for the last 2-3 weeks. We will titrate her metformin up to 1000 mg twice a day. I suspect that this was, be enough, and she will need to initiate Lantus, which we have discussed at today's appointment. She would like to try just oral medications for 3 more months, and if no response she is willing to initiate Lantus. I will have her return in 6 weeks to ensure adherence to by mouth medications. Refer to: diabetes educator for medical nutrition therapy and social worker Instruction/counseling given: reminded to bring medications to each visit, discussed diet and provided printed educational material

## 2011-07-01 NOTE — Patient Instructions (Addendum)
Continue glipizide 10 mg twice daily.  We are going to increase your metformin dose. Continue taking 500 mg (1 tab) in the morning, and increase to 1000 mg (2 tablets) in the evening for one week. Then increase dose to 1000 mg (2 tablets) twice daily. If you have diarrhea or cannot tolerate this increased dose please call the clinic. We are going to try oral medications for 3 more months, and if there is no improvement, we will have to consider insulin.  Please be sure to schedule an appointment with Norm Parcel, our dietitian, who will help with your diabetes management  Our social worker will contact you to see if there's anything that she can do  Please be sure to bring all of your medications with you to every visit.  Should you have any new or worsening symptoms, please be sure to call the clinic at [phone].  Diabetes, Eating Away From Home Sometimes, you might eat in a restaurant or have meals that are prepared by someone else. You can enjoy eating out. However, the portions in restaurants may be much larger than needed. Listed below are some ideas to help you choose foods that will keep your blood glucose (sugar) in better control.  TIPS FOR EATING OUT  Know your meal plan and how many carbohydrate servings you should have at each meal. You may wish to carry a copy of your meal plan in your purse or wallet. Learn the foods included in each food group.   Make a list of restaurants near you that offer healthy choices. Take a copy of the carry-out menus to see what they offer. Then, you can plan what you will order ahead of time.   Become familiar with serving sizes by practicing them at home using measuring cups and spoons. Once you learn to recognize portion sizes, you will be able to correctly estimate the amount of total carbohydrate you are allowed to eat at the restaurant. Ask for a takeout box if the portion is more than you should have. When your food comes, leave the amount you  should have on the plate, and put the rest in the takeout box before you start eating.   Plan ahead if your mealtime will be different from usual. Check with your caregiver to find out how to time meals and medicine if you are taking insulin.   Avoid high-fat foods, such as fried foods, cream sauces, high-fat salad dressings, or any added butter or margarine.   Do not be afraid to ask questions. Ask your server about the portion size, cooking methods, ingredients and if items can be substituted. Restaurants do not list all available items on the menu. You can ask for your main entree to be prepared using skim milk, oil instead of butter or margarine, and without gravy or sauces. Ask your waiter or waitress to serve salad dressings, gravy, sauces, margarine, and sour cream on the side. You can then add the amount your meal plan suggests.   Add more vegetables whenever possible.   Avoid items that are labeled "jumbo," "giant," "deluxe," or "supersized."   You may want to split an entre with someone and order an extra side salad.   Watch for hidden calories in foods like croutons, bacon, or cheese.   Ask your server to take away the bread basket or chips from your table.   Order a dinner salad as an appetizer.  You can eat most foods served in a restaurant. Some foods are  better choices than others. Breads and Starches  Recommended: All kinds of bread (wheat, rye, white, oatmeal, [place], [place], raisin), hard or soft dinner rolls, frankfurter or hamburger buns, small bagels, small corn or whole-wheat flour tortillas.   Avoid: Frosted or glazed breads, butter rolls, egg or cheese breads, croissants, sweet rolls, pastries, coffee cake, glazed or frosted doughnuts, muffins.  Crackers  Recommended: Animal crackers, graham, rye, saltine, oyster, and matzoth crackers. Bread sticks, melba toast, rusks, pretzels, popcorn (without fat), zwieback toast.   Avoid: High-fat snack crackers or chips.  Buttered popcorn.  Cereals  Recommended: Hot and cold cereals. Whole grains such as oatmeal or shredded wheat are good choices.   Avoid: Sugar-coated or granola type cereals.  Potatoes/Pasta/Rice/Beans  Recommended: Order baked, boiled, or mashed potatoes, rice or noodles without added fat, whole beans. Order gravies, butter, margarine, or sauces on the side so you can control the amount you add.   Avoid: Hash browns or fried potatoes. Potatoes, pasta, or rice prepared with cream or cheese sauce. Potato or pasta salads prepared with large amounts of dressing. Fried beans or fried rice.  Vegetables  Recommended: Order steamed, baked, boiled, or stewed vegetables without sauces or extra fat. Ask that sauce be served on the side. If vegetables are not listed on the menu, ask what is available.   Avoid: Vegetables prepared with cream, butter, or cheese sauce. Fried vegetables.  Salad Bars  Recommended: Many of the vegetables at a salad bar are considered "free." Use lemon juice, vinegar, or low-calorie salad dressing (fewer than 20 calories per serving) as "free" dressings for your salad. Look for salad bar ingredients that have no added fat or sugar such as tomatoes, lettuce, cucumbers, broccoli, carrots, onions, and mushrooms.   Avoid: Prepared salads with large amounts of dressing, such as coleslaw, caesar salad, macaroni salad, bean salad, or carrot salad.  Fruit  Recommended: Eat fresh fruit or fresh fruit salad without added dressing. A salad bar often offers fresh fruit choices, but canned fruit at a restaurant is usually packed in sugar or syrup.   Avoid: Sweetened canned or frozen fruits, plain or sweetened fruit juice. Fruit salads with dressing, sour cream, or sugar added to them.  Meat and Meat Substitutes  Recommended: Order broiled, baked, roasted, or grilled meat, poultry, or fish. Trim off all visible fat. Do not eat the skin of poultry. The size stated on the menu is the raw  weight. Meat shrinks by  in cooking (for example, 4 oz raw equals 3 oz cooked meat).   Avoid: Deep-fat fried meat, poultry, or fish. Breaded meats.  Eggs  Recommended: Order soft, hard-cooked, poached, or scrambled eggs. Omelets may be okay, depending on what ingredients are added. Egg substitutes are also a good choice.   Avoid: Fried eggs, eggs prepared with cream or cheese sauce.  Milk  Recommended: Order low-fat or fat-free milk according to your meal plan. Plain, nonfat yogurt or flavored yogurt with no sugar added may be used as a substitute for milk. Soy milk may also be used.   Avoid: Milk shakes or sweetened milk beverages.  Soups and Combination Foods  Recommended: Clear broth or consomm are "free" foods and may be used as an appetizer. Broth-based soups with fat removed count as a starch serving and are preferred over cream soups. Soups made with beans or split peas may be eaten but count as a starch.   Avoid: Fatty soups, soup made with cream, cheese soup. Combination foods prepared with  excessive amounts of fat or with cream or cheese sauces.  Desserts and Sweets  Recommended: Ask for fresh fruit. Sponge or angel food cake without icing, ice milk, no sugar added ice cream, sherbet, or frozen yogurt may fit into your meal plan occasionally.   Avoid: Pastries, puddings, pies, cakes with icing, custard, gelatin desserts.  Fats and Oils  Recommended: Choose healthy fats such as olive oil, canola oil, or tub margarine, reduced fat or fat-free sour cream, cream cheese, avocado, or nuts.   Avoid: Any fats in excess of your allowed portion. Deep-fried foods or any food with a large amount of fat.  Note: Ask for all fats to be served on the side, and limit your portion sizes according to your meal plan. Document Released: 02/04/2005 Document Revised: 01/24/2011 Document Reviewed: 08/25/2008 [facility] Patient Information 2012 [place], [place].

## 2011-07-01 NOTE — Assessment & Plan Note (Signed)
Followed by ID clinic.  She is somewhat compliant with Complera.

## 2011-07-01 NOTE — Assessment & Plan Note (Signed)
Wet Prep & GC/C sent today.  Will follow up with results.

## 2011-07-01 NOTE — Assessment & Plan Note (Signed)
Continues to have symptoms of depression d/t situation stressors, but she never had Zoloft filled.  I will refer her to our social work to see if she is interested in any other mental health options around here (she notes she has been to psychiatrists in the past, but currently feels very overwhelmed.)

## 2011-07-01 NOTE — Progress Notes (Signed)
Subjective:   Patient ID: Ashley Vaughn female   DOB: 04/23/1965 46 y.o.   MRN: [id number]  HPI: Ashley Vaughn is a 46 y.o. woman with PMH sig for HIV, uncontrolled DM & Depression who presents today from ID clinic to become established for mgmt of DM.    DM: Had gestational DM, and diagnosed about 18 years ago.  She does not have meter.  She has not been compliant with her DM medications (glipizide or metformin) for the last 2-3 weeks as she has been out of medications.  Even when she has her medications, she is only intermittently compliant, she says d/t life stressors.  She reports polydipsia, polyphagia & polyuria.  She notes some numbness/tingling of her finger tips as well as fatigue.   Depression: Has never filled the Zoloft. Is not currently being seen by any mental health professional. Reports mood is tired, irritated, frustrated. Reports agitation. She has difficulty staying asleep. She denies suicidal or homicidal ideation, but does sometimes wish that she would die. She has never created a plan to kill herself, and she reports that she would not do that because she takes care of her granddaughter, and would not likely occur behind. Currently she is not interested in taking antidepressant medications. Her depression is exacerbated by her social situation, as her mother recently passed away, she has 3 grown children with several problems who live with her, and she is essentially the primary caregiver and now for her granddaughter. She is also concerned that she will soon become homeless. 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 She currently only has one client.  Vaginal itching: Began 3 weeks ago. She reports that she has not been sexually active in 2 months. She's had the same partner for the last 9 months. She denies dysuria or vaginal discharge.   Review of  Systems: Constitutional: Denies fever, chills, diaphoresis, appetite change   HEENT: Denies photophobia, redness, hearing loss, ear pain, congestion, sore throat, rhinorrhea, sneezing, mouth sores, trouble swallowing, neck pain, neck stiffness and tinnitus.   reports right eye injury a few months ago by her grand daughter, and since then has noticed increased tearing and blurry vision of that eye; she never filled the eyedrops that she was prescribed Respiratory: Denies SOB, DOE, cough, chest tightness,  and wheezing.   Cardiovascular: Denies chest pain, palpitations and leg swelling.  Gastrointestinal: Denies nausea, vomiting, abdominal pain, diarrhea, constipation, blood in stool and abdominal distention.  Genitourinary: Denies dysuria, urgency, frequency, hematuria, flank pain and difficulty urinating.  Musculoskeletal: Denies myalgias, back pain, joint swelling, arthralgias and gait problem.  Skin: Denies pallor, rash and wound.  Neurological: Denies dizziness, seizures, syncope, weakness, light-headedness, numbness and headaches.  Psychiatric/Behavioral: Denies suicidal ideation, mood changes, confusion, nervousness and agitation  Objective:  Physical Exam: Filed Vitals:   07/01/11 1600  BP: 121/81  Pulse: 81  Temp: 97.2 F (36.2 C)  TempSrc: Oral  Weight: 158 lb 8 oz (71.895 kg)   Constitutional: Vital signs reviewed.  Patient is a well-developed and well-nourished woman in no acute distress and cooperative with exam.  Mouth: no erythema or exudates, MMM, poor dentition Eyes: PERRL, EOMI, conjunctivae normal, No scleral icterus.  Cardiovascular: RRR, S1 normal, S2 normal, no MRG, pulses symmetric and intact bilaterally Pulmonary/Chest: CTAB, no wheezes, rales, or rhonchi Abdominal: Soft. Non-tender, non-distended, bowel sounds are normal Musculoskeletal: No joint  deformities, erythema, or stiffness, ROM full and no nontender Hematology: no inginal adenopathy.  GU: Malodorous  Leukorrhea in vaginal vault, no tenderness, no adnexal mass, no green or bloody discharge Neurological: A&O x3 Skin: Warm, dry and intact. No rash, cyanosis, or clubbing.  Psychiatric: Emotional during interview, somewhat depressed affect (patient was videotaped for session with Dr. Nadine Counts)  Assessment & Plan:   Case and care discussed with Dr. Rogelia Boga. Patient to return in 6 weeks for diabetes followup. Please see problem oriented turning for further details.

## 2011-07-02 LAB — MICROALBUMIN / CREATININE URINE RATIO
Creatinine, Urine: 47 mg/dL
Microalb, Ur: 2.8 mg/dL — ABNORMAL HIGH (ref 0.00–1.89)

## 2011-07-03 MED ORDER — METRONIDAZOLE 500 MG PO TABS: 2000.0000 mg | ORAL_TABLET | Freq: Once | ORAL | Status: AC

## 2011-07-03 NOTE — Progress Notes (Signed)
Addended by: Alanson Puls on: 07/03/2011 11:04 AM   Modules accepted: Orders

## 2011-07-04 ENCOUNTER — Telehealth: Payer: Self-pay | Admitting: Licensed Clinical Social Worker

## 2011-07-04 NOTE — Telephone Encounter (Signed)
Pt referred to CSW for increased social stressors.  Pt has dx of Depression, which pt has declined medication or therapy.  Ms. Hardebeck is the primary caregiver to her grandchild and has her adult children living in the home with her.  Pt has had to decrease her hours at work due to caring for grandchild.  This decrease has affected her financially.  Ms. Bartosik has dx of  DM II which is poorly managed.  Pt has the [facility] card.  CSW placed call to pt to provide information on local resources, support groups and [facility] referral.  Pt will need to meet with our diabetes educator prior to [facility] accepting into diabetes management program.  CSW left message requesting return call with hours available.

## 2011-07-05 NOTE — Telephone Encounter (Signed)
CSW returned call to pt.  Pt had left message on voice mail.  Pt not home at this time, CSW left message with family member.

## 2011-07-05 NOTE — Telephone Encounter (Signed)
[name] returned call to CSW.  Pt has dx of depression and has declined medication/therapy in the past, currently open to Group Therapy.  Pt states she will soon move out on her own and needs assistance with learning to live independently.  Pt states she has Medicaid, but list [facility] as her medical home.  CSW provided pt with instructions to change her medical home to [facility].  Discussed referral to [facility] and benefits of the program.  Pt is in agreement for [facility] referral.  Pt agreeable to local support groups.  CSW will place support group information in the mail to pt.  Pt denies add'l needs at this time.  Pt aware CSW is available to assist as needed.

## 2011-07-08 ENCOUNTER — Telehealth: Payer: Self-pay | Admitting: Licensed Clinical Social Worker

## 2011-07-08 ENCOUNTER — Encounter: Payer: Self-pay | Admitting: Licensed Clinical Social Worker

## 2011-07-08 NOTE — Telephone Encounter (Signed)
Ms. Arcos called in to CSW requesting resources to obtain furnitures.  Ms. Runions inquired if [facility] was affiliated with the [facility], unfortunately [facility] is unable.  CSW inquired other avenues to participate with [facility].  Pt does received food stamps, CSW referred pt to her DSS caseworker, as DSS if affiliated with [place].  CSW also provided pt information of [facility] [place], [address] and [address] Pt denies add'l need at this time.

## 2011-07-09 ENCOUNTER — Telehealth: Payer: Self-pay | Admitting: Infectious Disease

## 2011-07-09 NOTE — Telephone Encounter (Signed)
Patient needs 2 grams of metronidazole for her trichomonas seen on pap smear. Partner should also be tested and treated. Looks like Dr. Milbert Coulter already ordered it

## 2011-07-26 ENCOUNTER — Ambulatory Visit: Payer: Self-pay | Admitting: Infectious Diseases

## 2011-08-12 ENCOUNTER — Other Ambulatory Visit: Payer: Self-pay | Admitting: *Deleted

## 2011-08-12 DIAGNOSIS — B20 Human immunodeficiency virus [HIV] disease: Secondary | ICD-10-CM

## 2011-08-12 MED ORDER — EMTRICITAB-RILPIVIR-TENOFOV DF 200-25-300 MG PO TABS: 1.0000 | ORAL_TABLET | Freq: Every day | ORAL | Status: DC

## 2011-08-14 ENCOUNTER — Other Ambulatory Visit: Payer: Self-pay | Admitting: Licensed Clinical Social Worker

## 2011-08-14 DIAGNOSIS — B20 Human immunodeficiency virus [HIV] disease: Secondary | ICD-10-CM

## 2011-08-14 MED ORDER — EMTRICITAB-RILPIVIR-TENOFOV DF 200-25-300 MG PO TABS: 1.0000 | ORAL_TABLET | Freq: Every day | ORAL | Status: DC

## 2011-08-28 ENCOUNTER — Other Ambulatory Visit: Payer: Self-pay | Admitting: Dietician

## 2011-08-28 ENCOUNTER — Encounter: Payer: Self-pay | Admitting: Internal Medicine

## 2011-08-28 ENCOUNTER — Ambulatory Visit (INDEPENDENT_AMBULATORY_CARE_PROVIDER_SITE_OTHER): Payer: Self-pay | Admitting: Dietician

## 2011-08-28 ENCOUNTER — Encounter: Payer: Self-pay | Admitting: Dietician

## 2011-08-28 VITALS — Ht 63.0 in | Wt 157.5 lb

## 2011-08-28 DIAGNOSIS — E1140 Type 2 diabetes mellitus with diabetic neuropathy, unspecified: Secondary | ICD-10-CM

## 2011-08-28 DIAGNOSIS — E1142 Type 2 diabetes mellitus with diabetic polyneuropathy: Secondary | ICD-10-CM

## 2011-08-28 DIAGNOSIS — E1149 Type 2 diabetes mellitus with other diabetic neurological complication: Secondary | ICD-10-CM

## 2011-08-28 MED ORDER — ACCU-CHEK FASTCLIX LANCETS MISC: 1.0000 | Status: DC

## 2011-08-28 MED ORDER — GLUCOSE BLOOD VI STRP: ORAL_STRIP | Status: DC

## 2011-08-28 MED ORDER — ACCU-CHEK NANO SMARTVIEW W/DEVICE KIT: 1.0000 | PACK | Freq: Two times a day (BID) | Status: DC

## 2011-08-28 NOTE — Telephone Encounter (Signed)
Patient agreed to check blood sugar and bring meter to future visits. Requests prescription for supplies be sent to [facility] on [address] in [place].

## 2011-08-28 NOTE — Progress Notes (Signed)
Diabetes Self-Management Training (DSMT)  Initial Visit  08/28/2011 Ashley Vaughn, identified by name and date of birth, is a 46 y.o. female with Type 2 Diabetes. Year of diabetes diagnosis: 2001  ASSESSMENT Patient concerns are Glycemic control.  Height 5\' 3"  (1.6 m), weight 157 lb 8 oz (71.442 kg). Body mass index is 27.90 kg/(m^2). Lab Results  Component Value Date   LDLCALC 97 04/03/2011   Lab Results  Component Value Date   HGBA1C 13.7 07/01/2011   Labs reviewed.  Health maintenance reviewed.   Family history of diabetes: No Support systems: discussed today- has friend in [place] she feels close to and is thinking about calling Special needs: None Prior DM Education: Yes Patients belief/attitude about diabetes: Diabetes can be controlled. Self foot exams daily: No- but often Diabetes Complications: Neuropathy    Medications See Medications list.  Has adequate knowledge is taking as prescribed   Exercise Plan Doing ADLs    .   Self-Monitoring Medication Nutrition Monitor: contour Frequency of testing: stopped testing- ran out of strips Lunch: 400 yesterday per patient, today 351 after [facility] sweet tea and chicken sandwich Hyperglycemia: Yes Daily Hypoglycemia: No   Meal Planning Some knowledge and Interested in improving Recall: Noon: often leftovers like burger on bread or chicken and soda/ juice or sweet tea 5 PM- pizza x 2 slices and soda/ or beer  Drinks lowfat milk, 8 oz juice/day,  at least 1 can soda per day, does not drink water often   Assessment comments: Discussed physician plan for 3 months trial of increased dose medication and lifestyle therapy vs. Insulin. Patient feels she can make more adjustments to intake to lower blood sugar before starting insulin although she is not opposed to starting insulin. Verbalizing frustration of being a single parent of teens.     INDIVIDUAL DIABETES EDUCATION PLAN:  Nutrition  management Medication Monitoring Acute complication: _______________________________________________________________________  Intervention TOPICS COVERED TODAY:  Nutrition management  Role of diet in the treatment of diabetes and the relationship between the three main macronutritents and blood glucose control. Information on hints to eating out and maintain blood glucose control. Monitoring  Taught/evaluated SMBG with Accu check nano meter. Purpose and frequency of SMBG. Acute complication  Discussed and identified patients' treatment of hyperglycemia.  PATIENTS GOALS/PLAN (copy and paste in patient instructions so patient receives a copy): 1.  Learning Objective:       Know what foods increase blood sugar, portion sizes and healthier options.  2.  Behavioral Objective:         Nutrition: To improve blood glucose control I will follow meal plan of decreased added sugar. Never 0%  Personalized Follow-Up Plan for Ongoing Self Management Support:  Doctor's Office, friends, CDE visits and encouraged patient to call friend more often for support or seek additional support persons ______________________________________________________________________   Outcomes Expected outcomes: Demonstrated interest in learning.Expect positive changes in lifestyle. Self-care Barriers: Lack of material resources- patient given food from pantry today, is in touch with social worker Education material provided: yes- handout on plate method Patient to contact team via Phone if problems or questions. Time in: 1530     Time out: 1630  Future DSMT - 3 wks   Ciarah Peace, [name]

## 2011-08-28 NOTE — Patient Instructions (Signed)
To lower your blood sugar you said you want to:  Eat small meals  Drink less soda and juice- Limit to 1/2 cup a day, then drink water, selzer water, unsweet tea, coffee, 1% fat milk, vegetable juice, Lite or non alcoholic  Beer- no more than 1 can 12 oz per day  Can you get unsweet tea at [facility] for 1$???  Try to eat more fruits and vegetables: try to eat half a plate veggies each meal.  Check blood sugars once daily or 4 times on one day and then skip 3-4 days and then check again 4 times in a day. (Before meal and bedtime)   Follow up in 3 weeks.

## 2011-09-09 ENCOUNTER — Ambulatory Visit: Payer: Self-pay | Admitting: Infectious Diseases

## 2011-09-16 ENCOUNTER — Other Ambulatory Visit: Payer: Self-pay

## 2011-09-17 ENCOUNTER — Other Ambulatory Visit: Payer: Self-pay

## 2011-09-30 ENCOUNTER — Ambulatory Visit: Payer: Self-pay | Admitting: Infectious Diseases

## 2011-10-01 ENCOUNTER — Encounter: Payer: Self-pay | Admitting: Infectious Diseases

## 2011-10-01 ENCOUNTER — Ambulatory Visit: Payer: Self-pay

## 2011-10-01 ENCOUNTER — Ambulatory Visit (INDEPENDENT_AMBULATORY_CARE_PROVIDER_SITE_OTHER): Payer: Self-pay | Admitting: Infectious Diseases

## 2011-10-01 VITALS — BP 131/85 | HR 88 | Temp 97.5°F | Ht 63.0 in | Wt 154.0 lb

## 2011-10-01 DIAGNOSIS — E1149 Type 2 diabetes mellitus with other diabetic neurological complication: Secondary | ICD-10-CM

## 2011-10-01 DIAGNOSIS — B20 Human immunodeficiency virus [HIV] disease: Secondary | ICD-10-CM

## 2011-10-01 DIAGNOSIS — E1140 Type 2 diabetes mellitus with diabetic neuropathy, unspecified: Secondary | ICD-10-CM

## 2011-10-01 DIAGNOSIS — E1142 Type 2 diabetes mellitus with diabetic polyneuropathy: Secondary | ICD-10-CM

## 2011-10-01 NOTE — Assessment & Plan Note (Signed)
My great appreciation to Dr Grayce Sessions for her work with pt. She does not want to go on insulin if she can avoid it.

## 2011-10-01 NOTE — Progress Notes (Signed)
  Subjective:    Patient ID: Ashley Vaughn, female    DOB: [birth year], 46 y.o.   MRN: [id number]  HPI 46 yo F with hx of DM and HIV+. She has been on complera since 12-2010. Has been seen in IM for her DM (my great appreciation to Dr Grayce Sessions). Trying to take complera, with food.   HIV 1 RNA Quant (copies/mL)  Date Value  04/03/2011 <20   01/07/2011 9720*  10/04/2010 10100*     CD4 T Cell Abs (cmm)  Date Value  04/03/2011 1680   01/07/2011 1250   10/18/2010 1420    FSG have been 130-200. Non problems with vision- occas tearing. Wt has gone down some (3#). Has not had PAP this year.     Review of Systems  Constitutional: Negative for fever, chills, appetite change and unexpected weight change.  Gastrointestinal: Negative for diarrhea and constipation.  Genitourinary: Negative for dysuria.  Neurological: Negative for numbness and headaches.  "My mood is kind of crazy because i'm dealing with a 26 year old with 2 kids"     Objective:   Physical Exam  Constitutional: She appears well-developed and well-nourished.  HENT:  Mouth/Throat: No oropharyngeal exudate.  Eyes: EOM are normal. Pupils are equal, round, and reactive to light.  Neck: Neck supple.  Cardiovascular: Normal rate, regular rhythm and normal heart sounds.   Pulmonary/Chest: Effort normal and breath sounds normal.  Abdominal: Soft. Bowel sounds are normal. She exhibits no distension. There is no tenderness.  Lymphadenopathy:    She has no cervical adenopathy.  Neurological:       Normal light touch, she has a area on the ball of her R foot that she states has some numbness.   Skin:       Skin on feet is intact, no sores.          Assessment & Plan:

## 2011-10-01 NOTE — Assessment & Plan Note (Addendum)
She is doing very well. She is given condoms. She is going to [facility] for PAP 8-15. Will check her labs today. Will see her back in 6 months. Will refer her to dental as well. Her vaccines are up to date.

## 2011-10-03 ENCOUNTER — Encounter: Payer: Self-pay | Admitting: Internal Medicine

## 2011-10-08 ENCOUNTER — Telehealth: Payer: Self-pay | Admitting: Licensed Clinical Social Worker

## 2011-10-08 NOTE — Telephone Encounter (Signed)
Ashley Vaughn placed call to this worker and left message requesting bus pass to return home from appt.  CSW returned call to pt.  CSW informed Ashley Vaughn, bus pass home can be provided.  Pt's appt is tomorrow, 8/21, CSW will provide pt with return [place] one way bus fare. Pt's telephone lost signal unable to continue phone call.

## 2011-10-09 ENCOUNTER — Encounter: Payer: Self-pay | Admitting: Internal Medicine

## 2011-10-09 ENCOUNTER — Ambulatory Visit (INDEPENDENT_AMBULATORY_CARE_PROVIDER_SITE_OTHER): Payer: Self-pay | Admitting: Internal Medicine

## 2011-10-09 ENCOUNTER — Encounter: Payer: Self-pay | Admitting: Licensed Clinical Social Worker

## 2011-10-09 ENCOUNTER — Ambulatory Visit (INDEPENDENT_AMBULATORY_CARE_PROVIDER_SITE_OTHER): Payer: Self-pay | Admitting: Dietician

## 2011-10-09 VITALS — BP 120/77 | HR 78 | Temp 97.1°F | Ht 63.0 in | Wt 158.0 lb

## 2011-10-09 DIAGNOSIS — E1165 Type 2 diabetes mellitus with hyperglycemia: Secondary | ICD-10-CM

## 2011-10-09 DIAGNOSIS — Z79899 Other long term (current) drug therapy: Secondary | ICD-10-CM

## 2011-10-09 DIAGNOSIS — E119 Type 2 diabetes mellitus without complications: Secondary | ICD-10-CM

## 2011-10-09 DIAGNOSIS — E1149 Type 2 diabetes mellitus with other diabetic neurological complication: Secondary | ICD-10-CM

## 2011-10-09 DIAGNOSIS — E1142 Type 2 diabetes mellitus with diabetic polyneuropathy: Secondary | ICD-10-CM

## 2011-10-09 DIAGNOSIS — B20 Human immunodeficiency virus [HIV] disease: Secondary | ICD-10-CM

## 2011-10-09 LAB — POCT GLYCOSYLATED HEMOGLOBIN (HGB A1C): Hemoglobin A1C: 9.8

## 2011-10-09 MED ORDER — METFORMIN HCL 500 MG PO TABS: 1000.0000 mg | ORAL_TABLET | Freq: Two times a day (BID) | ORAL | Status: DC

## 2011-10-09 MED ORDER — GLUCOSE BLOOD VI STRP: ORAL_STRIP | Status: DC

## 2011-10-09 MED ORDER — EMTRICITAB-RILPIVIR-TENOFOV DF 200-25-300 MG PO TABS: 1.0000 | ORAL_TABLET | Freq: Every day | ORAL | Status: DC

## 2011-10-09 MED ORDER — GLIPIZIDE 10 MG PO TABS: 10.0000 mg | ORAL_TABLET | Freq: Two times a day (BID) | ORAL | Status: DC

## 2011-10-09 NOTE — Assessment & Plan Note (Signed)
The patient is doing better with her diabetes as her A1c has dropped from 13.7 to 9.8. However there is still much room for improvement. She did have visit with diabetic educator after our visit to discuss diet and exercise. No changes to her medical regimen at today's visit. She does state that she will lose weight. Advised her at least 10-15 pounds weight loss would be good. She continues to take glipizide 10 mg twice daily and metformin 1000 mg twice daily. She states that she's doing better with compliance of her medications and the last month or so her blood sugars have been in the 130s. She did not bring her meter with her today for review and advised her very strongly to bring her meter to next visit in 6 weeks. She states that she checks her sugars when she gets strips.

## 2011-10-09 NOTE — Progress Notes (Signed)
Diabetes Self-Management Training (DSMT)  1st Follow up Visit  10/09/2011 [name], identified by name and date of birth, is a 46 y.o. female with Type 2 Diabetes. Year of diabetes diagnosis: 2001  ASSESSMENT Patient concerns are glycemic control  There is no height or weight on file to calculate BMI.  Labs reviewed.  Health maintenance reviewed.   Prior DM Education: Yes Patients belief/attitude about diabetes: Diabetes can be controlled. Self foot exams daily: More often Diabetes Complications: Neuropathy    Medications See Medications list.  Has adequate knowledge is taking as prescribed   Exercise Plan Doing ADLs    Discussed with physician today.   Self-Monitoring Medication Nutrition Monitor: contour Frequency of testing: stopped testing-daily Mostly 130 before breakfast sometimes 200s later if she eats greasy food Hyperglycemia: Yes- sometimes when she eats fried foods Hypoglycemia: No   Meal Planning Some knowledge and Interested in improving Recall: Green beans, mashed potatoes and baked chicken, sometimes Koolaid, no soda, eats fruit well   Assessment comments: Verbalizing opposing values of weight loss to help care for her diabetes versus her desire not to weight less than 150#. Her desired weight is 170# but she verbalizes that this " won't work for her"    INDIVIDUAL DIABETES EDUCATION PLAN:  Nutrition management Medication Monitoring Acute complication: _______________________________________________________________________  Intervention TOPICS COVERED TODAY:  Nutrition management  Review of diet in the treatment of diabetes and the relationship between the three main macronutrient and blood glucose control. Physical activity: - Discussed importance and effects of increased physical activity Goal setting- discussed how to set small achievable goals to help her make lifestyle changes  PATIENTS GOALS/PLAN (copy and paste in patient  instructions so patient receives a copy): 1.  Learning Objective:       Know what foods increase blood sugar, portion sizes and healthier options.  2.  Behavioral Objective:         Nutrition: To improve blood glucose control I will follow meal plan of decreased added sugar. Never 50%             Monitoring- being meter to all visits             Physical activity- be active > 50% of days- currently rates herself as being active 25%  Personalized Follow-Up Plan for Ongoing Self Management Support:  Doctor's Office, friends, CDE visits and encouraged patient to call friend more often for support or seek additional support persons ______________________________________________________________________   Outcomes Expected outcomes: Demonstrated interest in learning.Expect positive changes in lifestyle. Self-care Barriers: Lack of material resources- patient given food from pantry today, is in touch with social worker Education material provided: yes Patient to contact team via Phone if problems or questions. Time in: 1130     Time out: 1200  Future DSMT - 6-8 wks   Ashley Vaughn, [name]

## 2011-10-09 NOTE — Progress Notes (Signed)
Ashley Vaughn attended her scheduled [facility] appt.  However, Ashley Vaughn lives in [place] and needed the Express bus pass that she get through [facility].  [facility] only has access to [place] one fare pass.  CSW spoke with pt's case worker, Jasmine December, through [facility].  ID currently in meeting at this time and unable to provide pt with Express pass.  Cost of Express pass is $2.40, CSW provided pt with $5.00 through [facility] donations.

## 2011-10-09 NOTE — Progress Notes (Signed)
Subjective:     Patient ID: Ashley Vaughn, female   DOB: 1965/10/16, 47 y.o.   MRN: [id number]  HPI The patient is a 46 year old female who does come in today for a followup visit of her diabetes type 2 which is uncontrolled and HIV. She states that she is taking her diabetes medications more regularly. Apparently this was a problem in the past. At last visit 3 months ago she was seen and did have a hemoglobin A1c of 13.7. She states that she's been doing a little bit better with her diabetes but still not quite good yet. She states that her diet has been pretty poor and she is not exercising as much and she should be. She states that she is taking metformin 1000 mg twice a day and glipizide 10 mg twice daily. She doesn't like she's having any low sugars or any side effects of shakiness or dizziness or lightheadedness. She's not had any headaches, chills, fevers, shortness of breath, chest pain. She's not have any ulcers on her feet and foot exam was performed at today's visit. She does continue to see [facility] for her HIV and states that she takes her HIV medication regularly.  Review of Systems  Constitutional: Negative for fever, chills, diaphoresis, activity change, appetite change, fatigue and unexpected weight change.  HENT: Negative.   Eyes: Negative.   Respiratory: Negative for cough, chest tightness, shortness of breath and wheezing.   Cardiovascular: Negative for chest pain, palpitations and leg swelling.  Gastrointestinal: Negative.  Negative for nausea, vomiting and diarrhea.  Genitourinary: Negative.   Musculoskeletal: Negative.   Skin: Negative.   Neurological: Negative.  Negative for dizziness, tremors, syncope, weakness, light-headedness, numbness and headaches.  Hematological: Negative.   Psychiatric/Behavioral: Negative.        Objective:   Physical Exam  Constitutional: She is oriented to person, place, and time. She appears well-developed and well-nourished. No distress.    HENT:  Head: Normocephalic and atraumatic.  Eyes: EOM are normal. Pupils are equal, round, and reactive to light.  Neck: Normal range of motion. Neck supple.  Cardiovascular: Normal rate and regular rhythm.   Pulmonary/Chest: Effort normal and breath sounds normal. No respiratory distress. She has no wheezes. She has no rales.  Abdominal: Soft. Bowel sounds are normal. She exhibits no distension. There is no tenderness. There is no rebound.  Musculoskeletal: Normal range of motion. She exhibits no edema and no tenderness.  Neurological: She is alert and oriented to person, place, and time. No cranial nerve deficit.  Skin: Skin is warm and dry. No rash noted. She is not diaphoretic. No erythema. No pallor.  Psychiatric: She has a normal mood and affect. Her behavior is normal. Judgment and thought content normal.       Assessment/Plan:   1. Please see problem oriented charting.  2. Disposition-the patient be seen back in 6 weeks for close followup of her diabetes. She was encouraged strongly to lose weight and to work on her diet. She was also encouraged exercise. She no changes to her medications at today's regimen. She should have repeat urine microalbumin to creatinine ratio with check for Trichomonas clearing at follow up visit. If she continues to have some proteins blood in her urine she would benefit from ACE inhibitor. She did have visit with diabetic educator at the end of our visit.

## 2011-10-09 NOTE — Assessment & Plan Note (Signed)
Continues to take Emtricitab/Rilpivir/Tenofovir and follows with [facility]. Last CD4 count 1680.

## 2011-10-09 NOTE — Patient Instructions (Signed)
You were seen for your diabetes today and you are doing better but we still need to work on it. You need to lose about 10 pounds and start exercising to help your sugars. We will see you back in 6 weeks for a follow up visit. Please call us if you have any questions or problems at [phone].  Exercise to Lose Weight Exercise and a healthy diet may help you lose weight. Your doctor may suggest specific exercises. EXERCISE IDEAS AND TIPS  Choose low-cost things you enjoy doing, such as walking, bicycling, or exercising to workout videos.   Take stairs instead of the elevator.   Walk during your lunch break.   Park your car further away from work or school.   Go to a gym or an exercise class.   Start with 5 to 10 minutes of exercise each day. Build up to 30 minutes of exercise 4 to 6 days a week.   Wear shoes with good support and comfortable clothes.   Stretch before and after working out.   Work out until you breathe harder and your heart beats faster.   Drink extra water when you exercise.   Do not do so much that you hurt yourself, feel dizzy, or get very short of breath.  Exercises that burn about 150 calories:  Running 1  miles in 15 minutes.   Playing volleyball for 45 to 60 minutes.   Washing and waxing a car for 45 to 60 minutes.   Playing touch football for 45 minutes.   Walking 1  miles in 35 minutes.   Pushing a stroller 1  miles in 30 minutes.   Playing basketball for 30 minutes.   Raking leaves for 30 minutes.   Bicycling 5 miles in 30 minutes.   Walking 2 miles in 30 minutes.   Dancing for 30 minutes.   Shoveling snow for 15 minutes.   Swimming laps for 20 minutes.   Walking up stairs for 15 minutes.   Bicycling 4 miles in 15 minutes.   Gardening for 30 to 45 minutes.   Jumping rope for 15 minutes.   Washing windows or floors for 45 to 60 minutes.  Document Released: 03/09/2010 Document Revised: 01/24/2011 Document Reviewed:  03/09/2010 [facility] Patient Information 2012 [place], [place].

## 2011-10-09 NOTE — Patient Instructions (Signed)
Rosezella [place]. Heather goals for August September 2013  1- Continue to eat half a plate of veggies and limit starch and eat low fat protein 2- Exercise half of the time or more- ( You  rated yourself as 25% today) walking, sit- ups, jumping jacks   exercise How long it took in minutes  Monday    Tuesday    Wednesday    Thursday    Friday    Saturday    Sunday     3- Bring your meter to every visit

## 2011-10-25 ENCOUNTER — Other Ambulatory Visit: Payer: Self-pay | Admitting: *Deleted

## 2011-10-25 DIAGNOSIS — B20 Human immunodeficiency virus [HIV] disease: Secondary | ICD-10-CM

## 2011-10-25 DIAGNOSIS — E1140 Type 2 diabetes mellitus with diabetic neuropathy, unspecified: Secondary | ICD-10-CM

## 2011-10-25 MED ORDER — EMTRICITAB-RILPIVIR-TENOFOV DF 200-25-300 MG PO TABS: 1.0000 | ORAL_TABLET | Freq: Every day | ORAL | Status: DC

## 2011-10-27 ENCOUNTER — Telehealth: Payer: Self-pay | Admitting: Internal Medicine

## 2011-10-27 NOTE — Telephone Encounter (Signed)
Patient called and states that she notice a "skin boil" on her pubic area. She admits that she shaved her pubic hair a few days ago.  She described that the size of her "skin boil" is less than a dime, mild redness and tenderness to palpation, no swelling, no warmth feeling. She reports that she feels better after she squeezed her "boil" and some pus-looking drainage with some "bloody staff" was noted. Denies fever, chills or any other discomfort. She reports that she had similar problem "many many years ago."  The description of her skin condition is consistent with skin boil. I will send a note to the front desk to call her for appointment in am. I also instructed her that she should go to Urgent care if she has fever, chills or her "skin boil" looks worse. She agrees with the plan.

## 2011-12-25 ENCOUNTER — Encounter: Payer: Self-pay | Admitting: Internal Medicine

## 2011-12-30 ENCOUNTER — Telehealth: Payer: Self-pay | Admitting: *Deleted

## 2011-12-30 NOTE — Telephone Encounter (Signed)
CALLED PATIENT LEFT VOICE MESSAGE FOR PATIENT TO RETURN CALL. CALLING TO SCHEDULE PATIENT FOR DM EYE EXAM WITH [facility]. LELA STURDIVANT NT 11-11-013  5:40PM

## 2012-01-02 ENCOUNTER — Telehealth: Payer: Self-pay | Admitting: *Deleted

## 2012-01-02 NOTE — Telephone Encounter (Signed)
Message left requesting patient to call [facility] for PAP smear appt.  Due in December.

## 2012-01-07 ENCOUNTER — Telehealth: Payer: Self-pay | Admitting: *Deleted

## 2012-01-22 ENCOUNTER — Other Ambulatory Visit (HOSPITAL_COMMUNITY)
Admission: RE | Admit: 2012-01-22 | Discharge: 2012-01-22 | Disposition: A | Discharge status: Home / Self Care | Payer: Medicaid Other | Source: Ambulatory Visit | Attending: Internal Medicine | Admitting: Internal Medicine

## 2012-01-22 ENCOUNTER — Encounter: Payer: Self-pay | Admitting: Internal Medicine

## 2012-01-22 ENCOUNTER — Ambulatory Visit (INDEPENDENT_AMBULATORY_CARE_PROVIDER_SITE_OTHER): Payer: Medicaid Other | Admitting: Internal Medicine

## 2012-01-22 VITALS — BP 132/81 | HR 83 | Temp 98.2°F | Wt 166.5 lb

## 2012-01-22 DIAGNOSIS — E1149 Type 2 diabetes mellitus with other diabetic neurological complication: Secondary | ICD-10-CM

## 2012-01-22 DIAGNOSIS — N898 Other specified noninflammatory disorders of vagina: Secondary | ICD-10-CM

## 2012-01-22 DIAGNOSIS — Z79899 Other long term (current) drug therapy: Secondary | ICD-10-CM

## 2012-01-22 DIAGNOSIS — B20 Human immunodeficiency virus [HIV] disease: Secondary | ICD-10-CM

## 2012-01-22 DIAGNOSIS — E1140 Type 2 diabetes mellitus with diabetic neuropathy, unspecified: Secondary | ICD-10-CM

## 2012-01-22 DIAGNOSIS — F329 Major depressive disorder, single episode, unspecified: Secondary | ICD-10-CM

## 2012-01-22 DIAGNOSIS — E1142 Type 2 diabetes mellitus with diabetic polyneuropathy: Secondary | ICD-10-CM

## 2012-01-22 DIAGNOSIS — Z1151 Encounter for screening for human papillomavirus (HPV): Secondary | ICD-10-CM | POA: Insufficient documentation

## 2012-01-22 DIAGNOSIS — Z01419 Encounter for gynecological examination (general) (routine) without abnormal findings: Secondary | ICD-10-CM | POA: Insufficient documentation

## 2012-01-22 DIAGNOSIS — N760 Acute vaginitis: Secondary | ICD-10-CM | POA: Insufficient documentation

## 2012-01-22 DIAGNOSIS — F322 Major depressive disorder, single episode, severe without psychotic features: Secondary | ICD-10-CM

## 2012-01-22 DIAGNOSIS — Z124 Encounter for screening for malignant neoplasm of cervix: Secondary | ICD-10-CM

## 2012-01-22 DIAGNOSIS — L293 Anogenital pruritus, unspecified: Secondary | ICD-10-CM

## 2012-01-22 DIAGNOSIS — Z1239 Encounter for other screening for malignant neoplasm of breast: Secondary | ICD-10-CM

## 2012-01-22 MED ORDER — METFORMIN HCL 1000 MG PO TABS: 1000.0000 mg | ORAL_TABLET | Freq: Two times a day (BID) | ORAL | Status: DC

## 2012-01-22 NOTE — Patient Instructions (Addendum)
General Instructions: -Please call the [facility] to schedule your follow up appointment regarding your HIV. [phone]) -Please start taking your blood sugar every morning - your sugars are still running high, and we may need to start insulin soon. -I sent in a new prescription of metformin for you, they are 1000mg  tablets, so you will only have to take 1 tablet instead of 2; continue glipizide 10mg  twice daily. -Please have your eye exam done -You had your flu shot today   Treatment Goals:  Goals (1 Years of Data) as of 01/22/2012          As of Today 10/09/11 10/01/11 07/01/11 05/01/11     Blood Pressure    . Blood Pressure < 140/90  132/81 120/77 131/85 121/81 131/84     Result Component    . HEMOGLOBIN A1C < 7.0  8.3 9.8  13.7     . LDL CALC < 100            Progress Toward Treatment Goals:  Treatment Goal 01/22/2012  Hemoglobin A1C improved    Self Care Goals & Plans:  Self Care Goal 01/22/2012  Manage my medications take my medicines as prescribed; bring my medications to every visit  Monitor my health keep track of my blood glucose; bring my glucose meter and log to each visit  Eat healthy foods eat smaller portions; eat foods that are low in salt  Be physically active take a walk every day    Home Blood Glucose Monitoring 01/22/2012  Check my blood sugar once a day  When to check my blood sugar before breakfast     Care Management & Community Referrals:  Referral 01/22/2012  Referrals made for care management support diabetes educator

## 2012-01-22 NOTE — Assessment & Plan Note (Addendum)
Lab Results  Component Value Date   HGBA1C 8.3 01/22/2012   HGBA1C 12.6* 04/03/2011   POCGLU 111 11/07/2010   CREATININE 0.80 04/03/2011   CREATININE 0.91 11/02/2009   MICROALBUR 5.84* 01/22/2012   MICRALBCREAT 41.8* 01/22/2012   CHOL 156 04/03/2011   HDL 40 04/03/2011   TRIG 96 04/03/2011    Assessment: Diabetes control: not controlled Progress toward goals: improved Barriers to meeting goals: Financial need  Plan: Diabetes treatment: continue current medications - she takes metformin 1000 bid and glipizide 10 bid; I am hesitant to add a 3rd agent or start insulin since patient does not check CBGs frequently.  She should check CBGs at least daily for the next month and return to monitor CBGs to determine what to do next - she may be a candidate for starting insulin, continuing metformin, and d/c glipizide.  She should also try to lose weight, though this has proven to be difficult in setting of financial limitations to her diet.   Refer to: none Instruction/counseling given: reminded to get eye exam, reminded to bring blood glucose meter & log to each visit, reminded to bring medications to each visit, discussed the need for weight loss and discussed diet

## 2012-01-22 NOTE — Progress Notes (Signed)
Subjective:   Patient ID: Ashley Vaughn female   DOB: 04-06-1965 46 y.o.   MRN: [id number]  HPI: [name] is a 46 y.o. woman with h/o HIV and DM who returns for routine f/u.  She was last seen regarding DM on 10/09/11.  She reports several life stressors, but notes that she is trying her best and feels less depressed.    Type 2 DM, Diagnosed: 78 (pregnant with daughter, came back 2001/2002) Checks blood sugar: not often Associated symptoms: No Polyuria, polydipsia, blurry vision; No Symptoms of hypoglycemia Last Eye exam: Referral in place Last Lipid Panel: 04/03/11 97 Exercise?  Walks to work Diet?  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  She takes her medications at 2pm and 10pm   Last CD4 = 1680 Viral load = undectable Past Medical History  Diagnosis Date  . HIV infection   . Diabetes mellitus   . Depression    Current Outpatient Prescriptions  Medication Sig Dispense Refill  . ACCU-CHEK FASTCLIX LANCETS MISC 1 each by Does not apply route 1 day or 1 dose. Check blood sugar 4x a day before meals and bedtime  2 days each week. Dx code- 250.00.  102 each  6  . aspirin 81 MG EC tablet Take 81 mg by mouth daily.        . Blood Glucose Monitoring Suppl (ACCU-CHEK NANO SMARTVIEW) W/DEVICE KIT 1 each by Does not apply route 2 (two) times daily. Check blood sugar 4x a day before meals and bedtime  2 days each week. Dx code- 250.00.  1 kit  0  . Emtricitab-Rilpivir-Tenofovir 200-25-300 MG TABS Take 1 tablet by  mouth daily. With a 400-600-calorie meal  30 tablet  5  . glipiZIDE (GLUCOTROL) 10 MG tablet Take 1 tablet (10 mg total) by mouth 2 (two) times daily before a meal.  60 tablet  5  . glucose blood (ACCU-CHEK SMARTVIEW) test strip Check blood sugar 4x a day before meals and bedtime  2 days each week. Dx code- 250.00.  50 each  12  . metFORMIN (GLUCOPHAGE) 500 MG tablet Take 2 tablets (1,000 mg total) by mouth 2 (two) times daily with a meal.  120 tablet  3   Family History  Problem Relation Age of Onset  . Diabetes Mother   . Hypertension Mother   . Heart disease Father   . Arthritis Father   . Diabetes Maternal Uncle    History   Social History  . Marital Status: Legally Separated    Spouse Name: N/A    Number of Children: N/A  . Years of Education: N/A   Social History Main Topics  . Smoking status: Never Smoker   . Smokeless tobacco: Never Used  . Alcohol Use: 0.0 oz/week     Comment: 2-3 beers/week  . Drug Use: No     Comment: h/o cocaine abuse, clean since 2009  . Sexually Active: No     Comment: pt. given condoms   Other Topics Concern  .  None   Social History Narrative  . None   Review of Systems: Constitutional: Denies fever, chills, diaphoresis, appetite change and fatigue.  HEENT: Denies photophobia, eye pain, redness, hearing loss, ear pain, congestion, sore throat, rhinorrhea, sneezing, mouth sores, trouble swallowing, neck pain, neck stiffness and tinnitus.   Respiratory: Denies SOB, DOE, cough, chest tightness,  and wheezing.   Cardiovascular: Denies leg swelling.  Gastrointestinal: Denies nausea, vomiting, abdominal pain, diarrhea, constipation, blood in stool and abdominal distention.  Genitourinary: Denies dysuria, urgency, frequency, hematuria, flank pain and difficulty urinating.  Musculoskeletal: Denies myalgias, back pain, joint swelling, arthralgias and gait problem.  Skin: Denies pallor, rash and wound.  Neurological: Denies dizziness, seizures,  syncope, weakness, light-headedness, numbness and headaches.   Objective:  Physical Exam: Filed Vitals:   01/22/12 1336  BP: 132/81  Pulse: 83  Temp: 98.2 F (36.8 C)  TempSrc: Oral  Weight: 166 lb 8 oz (75.524 kg)   Constitutional: Vital signs reviewed.  Patient is a well-developed and well-nourished woman in no acute distress and cooperative with exam.  Mouth: no erythema or exudates, MMM Eyes: PERRL, EOMI, conjunctivae normal, No scleral icterus.  Neck: Supple, Trachea midline normal ROM, No JVD, mass, thyromegaly, or carotid bruit present.  Cardiovascular: RRR, S1 normal, S2 normal, no MRG, pulses symmetric and intact bilaterally Pulmonary/Chest: CTAB, no wheezes, rales, or rhonchi Abdominal: Soft. Non-tender, non-distended, bowel sounds are normal, no masses, organomegaly, or guarding present.  Pelvic Exam: No cervix, but small area of friable tissue at end of vaginal vault appearing similar to cervical os. No palpable adenopathy or adnexal masses.  +Thick white discharge Musculoskeletal: No joint deformities, erythema, or stiffness, ROM full and no nontender Neurological: A&O x3, Strength is normal and symmetric bilaterally, cranial nerve II-XII are grossly intact, no focal motor deficit, sensory intact to light touch bilaterally.  Skin: Warm, dry and intact. No rash, cyanosis, or clubbing.  Psychiatric: Normal mood and affect. speech and behavior is normal. Judgment and thought content normal. Cognition and memory are normal.   Assessment & Plan:  Case and care discussed with Dr. Meredith Pel. Please see problem oriented charting for further details. Patient to return in 1 month for review of CBGs and repeat vaginal exam.

## 2012-01-23 LAB — MICROALBUMIN / CREATININE URINE RATIO
Creatinine, Urine: 139.6 mg/dL
Microalb, Ur: 5.84 mg/dL — ABNORMAL HIGH (ref 0.00–1.89)

## 2012-01-23 NOTE — Assessment & Plan Note (Signed)
No vaginal symptoms today, but vaginal exam done for annual pap smear (she was unsure if she had a complete hysterectomy - chart review suggests complete hysterectomy, but pap still sent in setting of friable tissue).  On exam, she did have copious white discharge, and noted she had been treated for trichomonas 3 days prior.  Will send for wet prep to evaluate for yeast.  She did seem to have friable tissue on exam, which is likely from recent trichamonas infection, but will repeat exam in 1 month.

## 2012-01-23 NOTE — Assessment & Plan Note (Signed)
Will controlled and she is compliant with ART.  She is scheduled for follow up with Dr. Ninetta Lights in 03/2012

## 2012-01-23 NOTE — Assessment & Plan Note (Signed)
Significantly improved without medication.  She was given xanex in the ED, which I will not continue.  This is her 2nd panic attack, and the first one was in 2008.  If symptoms persist, we will need to reconsider treatment with SSRI - I discussed this with patient.

## 2012-02-07 ENCOUNTER — Telehealth: Payer: Self-pay | Admitting: *Deleted

## 2012-02-07 NOTE — Telephone Encounter (Signed)
Appreciate this follow up.  Patient should have clinic follow up in 1 month (from 01/22/12) for CBG monitoring, at which point we should also confirm that she followed up with behavioral health.  At my visit with her on 01/22/12, she did note depression, but that her mood was improving, and she was not interested in SSRI treatment at that time.  Will continue to follow. Thanks.

## 2012-02-07 NOTE — Telephone Encounter (Signed)
Call from [facility], Case [name] with [facility]  # [phone]  ( After first of the year [place] will be our contac ) Case Manager reports her Depression screen was 18. She wanted [place] to be aware of this. Pt has no thoughts of suicide and does have acute depression listed in our problem list. She is not on any meds for depression and does not go to Mental Health. Lanora Manis will have a Child psychotherapist help set up appointment with behavior health. Will also have nutritionist go out to home and work on diet and getting food for pt. Pt has broken her glucose meter and a new one will be given to her. Also it was noted pt was not taking her medications. A request for eye exam was made, but pt does not want to go to the last place she went because "the lady " damaged her eye.  Last office visit 12/4

## 2012-02-07 NOTE — Telephone Encounter (Signed)
Will have front desk call and schedule appointment for one month.

## 2012-03-18 ENCOUNTER — Ambulatory Visit (INDEPENDENT_AMBULATORY_CARE_PROVIDER_SITE_OTHER): Payer: Medicaid Other | Admitting: Internal Medicine

## 2012-03-18 ENCOUNTER — Other Ambulatory Visit (HOSPITAL_COMMUNITY)
Admission: RE | Admit: 2012-03-18 | Discharge: 2012-03-18 | Disposition: A | Discharge status: Home / Self Care | Payer: Medicaid Other | Source: Ambulatory Visit | Attending: Internal Medicine | Admitting: Internal Medicine

## 2012-03-18 ENCOUNTER — Encounter: Payer: Self-pay | Admitting: Internal Medicine

## 2012-03-18 VITALS — BP 147/88 | HR 71 | Temp 97.0°F | Wt 164.6 lb

## 2012-03-18 DIAGNOSIS — E1165 Type 2 diabetes mellitus with hyperglycemia: Secondary | ICD-10-CM

## 2012-03-18 DIAGNOSIS — L293 Anogenital pruritus, unspecified: Secondary | ICD-10-CM

## 2012-03-18 DIAGNOSIS — B20 Human immunodeficiency virus [HIV] disease: Secondary | ICD-10-CM

## 2012-03-18 DIAGNOSIS — F322 Major depressive disorder, single episode, severe without psychotic features: Secondary | ICD-10-CM

## 2012-03-18 DIAGNOSIS — E1149 Type 2 diabetes mellitus with other diabetic neurological complication: Secondary | ICD-10-CM

## 2012-03-18 DIAGNOSIS — N760 Acute vaginitis: Secondary | ICD-10-CM | POA: Insufficient documentation

## 2012-03-18 DIAGNOSIS — F329 Major depressive disorder, single episode, unspecified: Secondary | ICD-10-CM

## 2012-03-18 DIAGNOSIS — Z139 Encounter for screening, unspecified: Secondary | ICD-10-CM | POA: Insufficient documentation

## 2012-03-18 DIAGNOSIS — E1142 Type 2 diabetes mellitus with diabetic polyneuropathy: Secondary | ICD-10-CM

## 2012-03-18 DIAGNOSIS — Z0000 Encounter for general adult medical examination without abnormal findings: Secondary | ICD-10-CM | POA: Insufficient documentation

## 2012-03-18 NOTE — Assessment & Plan Note (Signed)
Vaginal exam appears improved today (no friable tissue) compared to one month prior.  She does have white discharge - yeast vs leukorrhea, in setting of pruritis and polyuria.  Wet prep done today.

## 2012-03-18 NOTE — Assessment & Plan Note (Signed)
Patient has some issues with medicaid - will refer for mammo and eye exam at next appt.

## 2012-03-18 NOTE — Assessment & Plan Note (Signed)
Compliance continues to be an issue.  It is difficult for her to remember to take BID meds, and she is currently taking both metformin and glipizide only in the morning.  Has a new glucometer but no testing supplies.  She will call us to tell us what type of glucometer so we can send in appropriate supplies.  She is not ready for insulin.  Will plan to prescribe extended release metformin and glipizide for once daily dosing.   -Return beginning of March for DM follow up -Lipid panel to be drawn with labs at [facility] (last LDL = 64, at goal 1 year prior) -Eye appt referral at next appt.

## 2012-03-18 NOTE — Patient Instructions (Addendum)
General Instructions: -It is very important for you to start taking your diabetes medications twice daily.  -When you get a chance (sooner than later), please bring your meter to clinic (or call and let us know what kind) so we can get testing supplies to you.  Our number is [phone]  -Regarding your mood, please try to seek therapy as soon as possible  -At your next visit, once the medicaid situation is sorted our, we will refer you for your annual mammogram and eye exam.  Please be sure to bring all of your medications with you to every visit.  Should you have any new or worsening symptoms, please be sure to call the clinic at [phone].   Treatment Goals:  Goals (1 Years of Data) as of 03/18/2012          As of Today 01/22/12 10/09/11 10/01/11 07/01/11     Blood Pressure    . Blood Pressure < 140/90  147/88 132/81 120/77 131/85 121/81     Result Component    . HEMOGLOBIN A1C < 7.0   8.3 9.8  13.7    . LDL CALC < 100            Progress Toward Treatment Goals:  Treatment Goal 03/18/2012  Hemoglobin A1C unable to assess  Other  unchanged    Self Care Goals & Plans:  Self Care Goal 03/18/2012  Manage my medications take my medicines as prescribed  Monitor my health keep track of my blood glucose; check my feet daily  Eat healthy foods eat smaller portions; eat foods that are low in salt; eat baked foods instead of fried foods  Be physically active take a walk every day; find an activity I enjoy    Home Blood Glucose Monitoring 01/22/2012  Check my blood sugar once a day  When to check my blood sugar before breakfast     Care Management & Community Referrals:  Referral 03/18/2012  Referrals made for care management support diabetes educator

## 2012-03-18 NOTE — Progress Notes (Signed)
Subjective:   Patient ID: Ashley Vaughn female   DOB: 1965/12/15 46 y.o.   MRN: [id number]  HPI: Ashley Vaughn is a 47 y.o. woman with h/o HIV and DM who returns for DM f/u.  Last seen on 01/22/12, at which visit she noted that she does not check CBGs frequently.  At that time, I asked that she check CBGs at least daily for 1 month.   At her last visit, A1c = 8.3 (on metformin 1000bid & glipizidE 10 bid)  Depression/Anxiety: No recent panic attacks.  Mood "so so", not really depressed. Still having problems with food.  [facility] has been trying to drop off healthier foods.  Recently increased stressors, car fixed yesterday.  Lives with daughter, son and 2 grand daughters.  Feels agitated and angry. No time to do things she enjoys.  Early morning wakening, reports 4-5 hours of sleep per night.   DM: Hasn't been able to check CBGs, but no strips. Doesn't know what meter she has.  Needs to switch medicaid to [facility] from [facility].  Hasn't been able to afford strips, but once medicaid straightened out, will be able to hopefully get supplies.  Has been taking glipizide & metformin only once daily.  No episodes of hypoglycemia.  Vaginal pruritis: Reports clear vaginal discharge.  Some pruritis but no bleeding (at baseline nor after intercourse). No burning with urination, Increased frequency.   2 episodes of urinary incontinence (week before last and few weeks ago). Increased urgency.  Sexually active with one partner, uses condom sometimes.     Past Medical History  Diagnosis Date  . HIV infection   . Diabetes mellitus   . Depression    Current Outpatient Prescriptions  Medication Sig Dispense Refill  . ACCU-CHEK FASTCLIX LANCETS MISC 1 each by Does not apply route 1 day or 1 dose. Check blood sugar 4x a day before meals and bedtime  2 days each week. Dx code- 250.00.  102 each  6  . aspirin 81 MG EC tablet Take 81 mg by mouth daily.        . Blood Glucose Monitoring Suppl (ACCU-CHEK  NANO SMARTVIEW) W/DEVICE KIT 1 each by Does not apply route 2 (two) times daily. Check blood sugar 4x a day before meals and bedtime  2 days each week. Dx code- 250.00.  1 kit  0  . Emtricitab-Rilpivir-Tenofovir 200-25-300 MG TABS Take 1 tablet by mouth daily. With a 400-600-calorie meal  30 tablet  5  . glipiZIDE (GLUCOTROL) 10 MG tablet Take 1 tablet (10 mg total) by mouth 2 (two) times daily before a meal.  60 tablet  5  . glucose blood (ACCU-CHEK SMARTVIEW) test strip Check blood sugar 4x a day before meals and bedtime  2 days each week. Dx code- 250.00.  50 each  12  . metFORMIN (GLUCOPHAGE) 1000 MG tablet Take 1 tablet (1,000 mg total) by mouth 2 (two) times daily with a meal.  120 tablet  3   Family History  Problem Relation Age of Onset  . Diabetes Mother   . Hypertension Mother   . Heart disease Father   . Arthritis Father   . Diabetes Maternal Uncle    History   Social History  . Marital Status: Legally Separated    Spouse Name: N/A    Number of Children: N/A  . Years of Education: N/A   Social History Main Topics  . Smoking status: Never Smoker   . Smokeless tobacco: Never Used  .  Alcohol Use: 0.0 oz/week     Comment: 2-3 beers/week  . Drug Use: No     Comment: h/o cocaine abuse, clean since 2009  . Sexually Active: No     Comment: pt. given condoms   Other Topics Concern  . None   Social History Narrative  . None   Review of Systems: Constitutional: Denies fever, chills, diaphoresis, appetite change and fatigue.  HEENT: Denies photophobia, eye pain, redness, hearing loss, ear pain, congestion, sore throat, rhinorrhea, sneezing, mouth sores, trouble swallowing, neck pain, neck stiffness and tinnitus.   Respiratory: Denies SOB, DOE, cough, chest tightness,  and wheezing.   Cardiovascular: Denies chest pain, palpitations and leg swelling.  Gastrointestinal: Denies nausea, vomiting, abdominal pain, diarrhea, constipation, blood in stool and abdominal distention.    Genitourinary: per HPI Skin: Denies pallor, rash and wound.  Neurological: Denies dizziness, seizures, syncope, weakness, light-headedness, numbness and headaches.     Objective:  Physical Exam: Filed Vitals:   03/18/12 1437  BP: 147/88  Pulse: 71  Temp: 97 F (36.1 C)  TempSrc: Oral  Weight: 164 lb 9.6 oz (74.662 kg)  SpO2: 100%   Constitutional: Vital signs reviewed.  Patient is a well-developed and well-nourished woman in no acute distress and cooperative with exam.   Mouth: no erythema or exudates, MMM Eyes: PERRL, EOMI, conjunctivae normal, No scleral icterus.  Cardiovascular: RRR, S1 normal, S2 normal, no MRG, pulses symmetric and intact bilaterally Pulmonary/Chest: CTAB, no wheezes, rales, or rhonchi Abdominal: Soft. Non-tender, non-distended, bowel sounds are normal, no masses, organomegaly, or guarding present.  GU: no CVA tenderness, no friable tissue visible in vaginal vault, but +leukorrhea  Hematology: no  inginal adenopathy.  Neurological: A&O x3 Skin: Warm, dry and intact. No rash, cyanosis, or clubbing.  Psychiatric: Depressed mood and affect. speech and behavior is normal. Judgment and thought content normal. Cognition and memory are normal.   Assessment & Plan:  Case and care discussed with Dr. Rogelia Boga.  Patient to return at the beginning of March for DM follow up as well as for referral for mammo and eye exam.  BP mildly elevated today, follow again at next visit. Several labs being done in Feb by [facility].

## 2012-03-18 NOTE — Assessment & Plan Note (Signed)
Depressed but without SI/HI.  Not ready to try anti depression meds at this time because she will likely not be compliant (per patient). Has tried zoloft in the past, which made her drowsy.  Is agreeable to going to [facility] for therapy.  Brochure given.

## 2012-03-18 NOTE — Assessment & Plan Note (Signed)
Compliant with ART. Knows about appt with [facility] in Feb.

## 2012-03-19 MED ORDER — GLIPIZIDE ER 10 MG PO TB24: 20.0000 mg | ORAL_TABLET | Freq: Every day | ORAL | Status: DC

## 2012-03-19 MED ORDER — METFORMIN HCL ER (OSM) 1000 MG PO TB24: 2000.0000 mg | ORAL_TABLET | Freq: Every day | ORAL | Status: DC

## 2012-03-25 ENCOUNTER — Other Ambulatory Visit: Payer: Self-pay

## 2012-04-08 ENCOUNTER — Other Ambulatory Visit: Payer: Self-pay | Admitting: *Deleted

## 2012-04-08 ENCOUNTER — Ambulatory Visit (INDEPENDENT_AMBULATORY_CARE_PROVIDER_SITE_OTHER): Payer: Medicaid Other | Admitting: Infectious Diseases

## 2012-04-08 ENCOUNTER — Encounter: Payer: Self-pay | Admitting: Infectious Diseases

## 2012-04-08 VITALS — BP 132/86 | HR 89 | Temp 98.1°F | Ht 63.0 in | Wt 166.0 lb

## 2012-04-08 DIAGNOSIS — Z23 Encounter for immunization: Secondary | ICD-10-CM

## 2012-04-08 DIAGNOSIS — E1149 Type 2 diabetes mellitus with other diabetic neurological complication: Secondary | ICD-10-CM

## 2012-04-08 DIAGNOSIS — B20 Human immunodeficiency virus [HIV] disease: Secondary | ICD-10-CM

## 2012-04-08 DIAGNOSIS — E1142 Type 2 diabetes mellitus with diabetic polyneuropathy: Secondary | ICD-10-CM

## 2012-04-08 DIAGNOSIS — E1140 Type 2 diabetes mellitus with diabetic neuropathy, unspecified: Secondary | ICD-10-CM

## 2012-04-08 DIAGNOSIS — Z113 Encounter for screening for infections with a predominantly sexual mode of transmission: Secondary | ICD-10-CM

## 2012-04-08 LAB — CBC WITH DIFFERENTIAL/PLATELET
Eosinophils Absolute: 0.1 10*3/uL (ref 0.0–0.7)
Hemoglobin: 12.4 g/dL (ref 12.0–15.0)
Lymphocytes Relative: 64 % — ABNORMAL HIGH (ref 12–46)
Lymphs Abs: 3.9 10*3/uL (ref 0.7–4.0)
MCH: 22 pg — ABNORMAL LOW (ref 26.0–34.0)
Monocytes Relative: 5 % (ref 3–12)
Neutro Abs: 1.8 10*3/uL (ref 1.7–7.7)
Neutrophils Relative %: 29 % — ABNORMAL LOW (ref 43–77)
RBC: 5.64 MIL/uL — ABNORMAL HIGH (ref 3.87–5.11)
WBC: 6.1 10*3/uL (ref 4.0–10.5)

## 2012-04-08 LAB — COMPREHENSIVE METABOLIC PANEL
ALT: 10 U/L (ref 0–35)
Albumin: 4.1 g/dL (ref 3.5–5.2)
CO2: 26 mEq/L (ref 19–32)
Calcium: 9.6 mg/dL (ref 8.4–10.5)
Chloride: 104 mEq/L (ref 96–112)
Glucose, Bld: 132 mg/dL — ABNORMAL HIGH (ref 70–99)
Potassium: 4 mEq/L (ref 3.5–5.3)
Sodium: 138 mEq/L (ref 135–145)
Total Protein: 7.4 g/dL (ref 6.0–8.3)

## 2012-04-08 LAB — LIPID PANEL
Cholesterol: 146 mg/dL (ref 0–200)
HDL: 42 mg/dL (ref >39)
Total CHOL/HDL Ratio: 3.5 Ratio
VLDL: 21 mg/dL (ref 0–40)

## 2012-04-08 NOTE — Assessment & Plan Note (Signed)
Has f/u next month with IM. Greatly appreciate Dr Aline August excellent care.

## 2012-04-08 NOTE — Progress Notes (Signed)
  Subjective:    Patient ID: Ashley Vaughn, female    DOB: 1965-10-13, 47 y.o.   MRN: [id number]  HPI 47 yo F with hx of DM and HIV+. She has been on complera since 12-2010. Has been seen in IM for her DM2 (dx 2002). Has been taking her ART without difficulty. Missed twice this month (due to stress).  Has been having trouble keeping up with her FSG as she has been moving a lot and can't keep track of her meter. Has not see ophtho recently.  PAP nl 01-2012. HIV 1 RNA Quant (copies/mL)  Date Value  04/03/2011 <20   01/07/2011 9720*  10/04/2010 10100*     CD4 T Cell Abs (cmm)  Date Value  04/03/2011 1680   01/07/2011 1250   10/18/2010 1420       Review of Systems  Constitutional: Negative for fever, chills, appetite change and unexpected weight change.  Gastrointestinal: Negative for diarrhea and constipation.  Genitourinary: Negative for dysuria.       No periods/hysterectomy       Objective:   Physical Exam  Constitutional: She appears well-developed and well-nourished.  HENT:  Mouth/Throat: No oropharyngeal exudate.  Eyes: EOM are normal. Pupils are equal, round, and reactive to light.  Neck: Neck supple.  Cardiovascular: Normal rate, regular rhythm and normal heart sounds.   Pulmonary/Chest: Effort normal and breath sounds normal.  Abdominal: Soft. Bowel sounds are normal. There is no tenderness.  Lymphadenopathy:    She has no cervical adenopathy.          Assessment & Plan:

## 2012-04-08 NOTE — Addendum Note (Signed)
Addended by: Mariea Clonts D on: 04/08/2012 04:12 PM   Modules accepted: Orders

## 2012-04-08 NOTE — Assessment & Plan Note (Signed)
She is doing well. Gets flu shot today. Will have her get labs today. Will check her Hep B SAb. Will see her back in 6 months.

## 2012-04-09 LAB — HEPATITIS B SURFACE ANTIBODY,QUALITATIVE: Hep B S Ab: NONREACTIVE

## 2012-04-09 LAB — RPR

## 2012-04-10 LAB — T-HELPER CELL (CD4) - (RCID CLINIC ONLY): CD4 % Helper T Cell: 42 % (ref 33–55)

## 2012-04-10 LAB — HIV-1 RNA QUANT-NO REFLEX-BLD: HIV 1 RNA Quant: <20 copies/mL (ref <20)

## 2012-04-20 ENCOUNTER — Other Ambulatory Visit: Payer: Self-pay | Admitting: Infectious Disease

## 2012-05-13 ENCOUNTER — Encounter: Payer: Medicaid Other | Admitting: Internal Medicine

## 2012-05-27 ENCOUNTER — Ambulatory Visit (INDEPENDENT_AMBULATORY_CARE_PROVIDER_SITE_OTHER): Payer: Medicaid Other | Admitting: Internal Medicine

## 2012-05-27 ENCOUNTER — Encounter: Payer: Self-pay | Admitting: Internal Medicine

## 2012-05-27 VITALS — BP 108/68 | HR 83 | Temp 97.3°F | Wt 168.6 lb

## 2012-05-27 DIAGNOSIS — E1142 Type 2 diabetes mellitus with diabetic polyneuropathy: Secondary | ICD-10-CM

## 2012-05-27 DIAGNOSIS — Z1239 Encounter for other screening for malignant neoplasm of breast: Secondary | ICD-10-CM

## 2012-05-27 DIAGNOSIS — Z0000 Encounter for general adult medical examination without abnormal findings: Secondary | ICD-10-CM

## 2012-05-27 DIAGNOSIS — Z79899 Other long term (current) drug therapy: Secondary | ICD-10-CM

## 2012-05-27 DIAGNOSIS — F322 Major depressive disorder, single episode, severe without psychotic features: Secondary | ICD-10-CM

## 2012-05-27 DIAGNOSIS — E1149 Type 2 diabetes mellitus with other diabetic neurological complication: Secondary | ICD-10-CM

## 2012-05-27 DIAGNOSIS — E1140 Type 2 diabetes mellitus with diabetic neuropathy, unspecified: Secondary | ICD-10-CM

## 2012-05-27 LAB — POCT GLYCOSYLATED HEMOGLOBIN (HGB A1C): Hemoglobin A1C: 7.8

## 2012-05-27 MED ORDER — GLUCOSE BLOOD VI STRP: ORAL_STRIP | Status: DC

## 2012-05-27 MED ORDER — ACCU-CHEK FASTCLIX LANCETS MISC: 1.0000 | Status: DC

## 2012-05-27 NOTE — Patient Instructions (Signed)
General Instructions: -You are doing great with your diabetes! Keep it up, because we still have some work to do.  Try to check your blood sugar ever few days and if you feel ill.  The next step to better control your sugars would be insulin, like we talked about.  -Be sure to have your mammogram done, as well as your eye exam.  Have results faxed to [place] at [phone]  -Try to take some "me time", and do something you enjoy, this will help with your mood.   Please be sure to bring all of your medications with you to every visit.  Should you have any new or worsening symptoms, please be sure to call the clinic at [phone].   Treatment Goals:  Goals (1 Years of Data) as of 05/27/12         As of Today 04/08/12 03/18/12 01/22/12 10/09/11     Blood Pressure    . Blood Pressure < 140/90  108/68 132/86 147/88 132/81 120/77     Result Component    . HEMOGLOBIN A1C < 7.0  7.8   8.3 9.8    . LDL CALC < 100   83         Progress Toward Treatment Goals:  Treatment Goal 05/27/2012  Hemoglobin A1C improved    Self Care Goals & Plans:  Self Care Goal 05/27/2012  Manage my medications take my medicines as prescribed; bring my medications to every visit; refill my medications on time  Monitor my health keep track of my blood glucose; bring my glucose meter and log to each visit; check my feet daily  Eat healthy foods eat foods that are low in salt; eat baked foods instead of fried foods  Be physically active take a walk every day    Home Blood Glucose Monitoring 05/27/2012  Check my blood sugar once a day  When to check my blood sugar before breakfast     Care Management & Community Referrals:  Referral 03/18/2012  Referrals made for care management support diabetes educator

## 2012-05-27 NOTE — Assessment & Plan Note (Signed)
Lab Results  Component Value Date   HGBA1C 7.8 05/27/2012   HGBA1C 8.3 01/22/2012   HGBA1C 9.8 10/09/2011     Assessment: Diabetes control: fair control Progress toward A1C goal:  improved  Plan: Medications:  continue current medications - glipizide 20 mg daily, metformin 1000 mg twice daily; the next step would be addition of a third agent such as Januvia, or insulin therapy. She has not ready to consider insulin therapy. She wants to work harder on controlling her diabetes on her current regimen. She has been making great strides thus far. Continue to monitor A1c in 3 months, and escalate therapy at that time if needed Home glucose monitoring: Frequency: once a day Timing: before breakfast Instruction/counseling given: reminded to get eye exam, reminded to bring blood glucose meter & log to each visit, reminded to bring medications to each visit, discussed the need for weight loss and discussed diet Educational resources provided: brochure;video Self management tools provided: home glucose meter

## 2012-05-27 NOTE — Progress Notes (Signed)
Subjective:   Patient ID: Ashley Vaughn female   DOB: [birth year] 47 y.o.   MRN: [id number]  HPI: [name] is a 47 y.o. woman with h/o HIV and DM who returns for DM f/u. Last seen by me on 03/18/12.    Depression/Anxiety:Mood has been ok.  No recent anxiety attack. Told that she is owed $1300 by food stamps, gets $226/month for 5-6 ppl; [facility] trying to get another provider to drop off food (she had some problems with the initial provider, as she thought that the provider is homosexual and making advances towards her). Continues to have car problems, so walks most places, or takes the bus. Lives with daughter, son and 2 grand daughters. Continues to feels agitated and angry. No time to do things she enjoys. Stays up until 5am, sleeps until 6-730am, then sleeps until 1pm - poor schedule due to housing situation.  Has been working with CSW to look for 2 bedroom apt.  DM: A1c improved to 7.8 from 8.3 on glipizide 20mg  daily, and metformin 1000mg  bid - not missing doses; Hasn't been able to check CBGs, but no meter or supplies.  Diet is still sporadic based on whats available, hasn't been cooking, some take out. Medicaid issues have been sorted out, so may be able to afford supplies.   Vaginal pruritis: 5 d ago used monistat, seems to be improving (was having foul white discharge); last sexually active 4-5 weeks ago; symptoms were 2 weeks ago but resolved.  Seen by Dr. Ninetta Lights on 04/08/12 --> She has been on complera since 12-2010.  Has been taking her ART without difficulty.  CD 4 = 1580.  Due for eye exam, mammo   Past Medical History  Diagnosis Date  . HIV infection   . Diabetes mellitus   . Depression    Current Outpatient Prescriptions  Medication Sig Dispense Refill  . ACCU-CHEK FASTCLIX LANCETS MISC 1 each by Does not apply route 1 day or 1 dose. Check blood sugar 4x a day before meals and bedtime  2 days each week. Dx code- 250.00.  102 each  6  . aspirin 81 MG EC tablet  Take 81 mg by mouth daily.        . Blood Glucose Monitoring Suppl (ACCU-CHEK NANO SMARTVIEW) W/DEVICE KIT 1 each by Does not apply route 2 (two) times daily. Check blood sugar 4x a day before meals and bedtime  2 days each week. Dx code- 250.00.  1 kit  0  . COMPLERA 200-25-300 MG TABS TAKE 1 TABLET BY MOUTH DAILY. WITH A 400-600-CALORIE MEAL  30 tablet  6  . glipiZIDE (GLUCOTROL XL) 10 MG 24 hr tablet Take 2 tablets (20 mg total) by mouth daily.  60 tablet  3  . glucose blood (ACCU-CHEK SMARTVIEW) test strip Check blood sugar 4x a day before meals and bedtime  2 days each week. Dx code- 250.00.  50 each  12  . metformin (GLUCOPHAGE XR) 1000 MG (OSM) 24 hr tablet Take 2 tablets (2,000 mg total) by mouth daily with breakfast.  60 tablet  3   No current facility-administered medications for this visit.   Family History  Problem Relation Age of Onset  . Diabetes Mother   . Hypertension Mother   . Heart disease Father   . Arthritis Father   . Diabetes Maternal Uncle    History   Social History  . Marital Status: Legally Separated    Spouse Name: N/A    Number  of Children: N/A  . Years of Education: N/A   Social History Main Topics  . Smoking status: Never Smoker   . Smokeless tobacco: Never Used  . Alcohol Use: 0.0 oz/week     Comment: occasional  . Drug Use: No     Comment: h/o cocaine abuse, clean since 2009  . Sexually Active: No     Comment: pt. given condoms   Other Topics Concern  . None   Social History Narrative  . None   Review of Systems: Constitutional: Denies fever, chills, diaphoresis, appetite change and fatigue.  HEENT: Denies photophobia, eye pain, redness, hearing loss, ear pain, congestion, sore throat, rhinorrhea, sneezing, mouth sores, trouble swallowing, neck pain, neck stiffness and tinnitus.   Respiratory: Denies SOB, DOE, cough, chest tightness,  and wheezing.   Cardiovascular: Denies chest pain, palpitations and leg swelling.  Gastrointestinal:  Denies nausea, vomiting, abdominal pain, diarrhea, constipation, blood in stool and abdominal distention.  Genitourinary: Denies dysuria, urgency, frequency, hematuria, flank pain and difficulty urinating.  Musculoskeletal: Denies myalgias, back pain, joint swelling, arthralgias and gait problem.  Skin: Denies pallor, rash and wound.  Neurological: Denies dizziness, seizures, syncope, weakness, light-headedness, numbness and headaches.  Psychiatric/Behavioral: Denies suicidal ideation, confusion, nervousness  Objective:  Physical Exam: Filed Vitals:   05/27/12 1337  BP: 108/68  Pulse: 83  Temp: 97.3 F (36.3 C)  TempSrc: Oral  Weight: 168 lb 9.6 oz (76.476 kg)  SpO2: 98%   Constitutional: Vital signs reviewed.  Patient is a well-developed and well-nourished woman in no acute distress and cooperative with exam.  Head: Normocephalic and atraumatic Mouth: no erythema or exudates, MMM Eyes: PERRL, EOMI, conjunctivae normal, No scleral icterus.  Neck: Supple, Trachea midline normal ROM, No JVD, mass, thyromegaly, or carotid bruit present.  Cardiovascular: RRR, S1 normal, S2 normal, no MRG, pulses symmetric and intact bilaterally Pulmonary/Chest: CTAB, no wheezes, rales, or rhonchi Abdominal: Soft. Non-tender, non-distended, bowel sounds are normal, no masses, organomegaly, or guarding present.  Musculoskeletal: No joint deformities, erythema, or stiffness, ROM full and no nontender Neurological: A&O x3, Strength is normal and symmetric bilaterally, cranial nerve II-XII are grossly intact, no focal motor deficit, sensory intact to light touch bilaterally.  Skin: Warm, dry and intact. No rash, cyanosis, or clubbing.  Psychiatric: Depressed mood and affect. speech and behavior is normal. Judgment and thought content normal. Cognition and memory are normal.   Assessment & Plan:  Case and care discussed with Dr. Meredith Pel. Patient to return in 3 months for diabetes followup. Please see  problem-oriented turning for further details.

## 2012-05-27 NOTE — Assessment & Plan Note (Signed)
Scheduled for mammogram and eye exam today.

## 2012-05-27 NOTE — Assessment & Plan Note (Signed)
Continues to be depressed without suicidal or homicidal ideation. Likely related to situation. Encouraged to take some personal time for herself. At this point, I agree with patient, that we will defer starting an antidepressant until and if the situation worsens

## 2012-06-01 ENCOUNTER — Inpatient Hospital Stay (HOSPITAL_BASED_OUTPATIENT_CLINIC_OR_DEPARTMENT_OTHER): Admission: RE | Admit: 2012-06-01 | Payer: Medicaid Other | Source: Ambulatory Visit

## 2012-06-22 NOTE — Addendum Note (Signed)
Addended by: Bufford Spikes on: 06/22/2012 10:47 AM   Modules accepted: Orders

## 2012-07-21 ENCOUNTER — Other Ambulatory Visit: Payer: Self-pay | Admitting: Internal Medicine

## 2012-08-19 ENCOUNTER — Encounter: Payer: Medicaid Other | Admitting: Internal Medicine

## 2012-08-27 ENCOUNTER — Other Ambulatory Visit: Payer: Self-pay

## 2012-09-09 ENCOUNTER — Encounter: Payer: Self-pay | Admitting: Internal Medicine

## 2012-09-09 ENCOUNTER — Ambulatory Visit (INDEPENDENT_AMBULATORY_CARE_PROVIDER_SITE_OTHER): Payer: Medicaid Other | Admitting: Internal Medicine

## 2012-09-09 VITALS — BP 116/79 | HR 79 | Temp 96.7°F | Wt 170.9 lb

## 2012-09-09 DIAGNOSIS — F329 Major depressive disorder, single episode, unspecified: Secondary | ICD-10-CM

## 2012-09-09 DIAGNOSIS — E1142 Type 2 diabetes mellitus with diabetic polyneuropathy: Secondary | ICD-10-CM

## 2012-09-09 DIAGNOSIS — E1140 Type 2 diabetes mellitus with diabetic neuropathy, unspecified: Secondary | ICD-10-CM

## 2012-09-09 DIAGNOSIS — Z0000 Encounter for general adult medical examination without abnormal findings: Secondary | ICD-10-CM

## 2012-09-09 DIAGNOSIS — F322 Major depressive disorder, single episode, severe without psychotic features: Secondary | ICD-10-CM

## 2012-09-09 DIAGNOSIS — E1149 Type 2 diabetes mellitus with other diabetic neurological complication: Secondary | ICD-10-CM

## 2012-09-09 LAB — POCT GLYCOSYLATED HEMOGLOBIN (HGB A1C): Hemoglobin A1C: 7.4

## 2012-09-09 LAB — GLUCOSE, CAPILLARY: Glucose-Capillary: 236 mg/dL — ABNORMAL HIGH (ref 70–99)

## 2012-09-09 MED ORDER — METFORMIN HCL 500 MG PO TABS: 500.0000 mg | ORAL_TABLET | Freq: Two times a day (BID) | ORAL | Status: DC

## 2012-09-09 NOTE — Progress Notes (Signed)
Subjective:   Patient ID: Ashley Vaughn female   DOB: 04/26/1965 47 y.o.   MRN: [id number]  HPI: [name] is a 47 y.o. woman with h/o HIV and DM who returns for DM f/u. Last seen by me 05/27/12.  No complaints today.  Did not bring meter today.  Admits to only taking glipizide, not metformin because she felt lethargic with metformin.  Did not check CBGs when feeling badly.  Today she is accompanied by her fiance, who she says is encouraging her to eat better and exercise more.  Life stressors have improved.  She appears happy today.  Eye exam/mammo - trying to set up before medicaid cuts out   09/28/12 - ID for blue card/orange card  Review of Systems: Constitutional: Denies fever, chills, diaphoresis, appetite change and fatigue.  Respiratory: Denies SOB, DOE, cough, chest tightness, and wheezing.  Cardiovascular: Denies chest pain, palpitations and leg swelling.  Gastrointestinal: Denies nausea, vomiting, abdominal pain, diarrhea, constipation,blood in stool and abdominal distention.  Genitourinary: Denies dysuria, urgency, frequency, hematuria, flank pain and difficulty urinating.  Musculoskeletal: Denies myalgias, back pain, joint swelling, arthralgias and gait problem.  Skin: Denies pallor, rash and wound.  Neurological: Denies dizziness, seizures, syncope, weakness, lightheadedness, numbness and headaches.    Past Medical History  Diagnosis Date  . HIV infection   . Diabetes mellitus   . Depression    Current Outpatient Prescriptions  Medication Sig Dispense Refill  . ACCU-CHEK FASTCLIX LANCETS MISC 1 each by Does not apply route 1 day or 1 dose. Check blood sugar 4x a day before meals and bedtime  2 days each week. Dx code- 250.00.  102 each  6  . Blood Glucose Monitoring Suppl (ACCU-CHEK NANO SMARTVIEW) W/DEVICE KIT 1 each by Does not apply route 2 (two) times daily. Check blood sugar 4x a day before meals and bedtime  2 days each week. Dx code- 250.00.  1 kit   0  . COMPLERA 200-25-300 MG TABS TAKE 1 TABLET BY MOUTH DAILY. WITH A 400-600-CALORIE MEAL  30 tablet  6  . GLIPIZIDE XL 10 MG 24 hr tablet TAKE 2 TABLETS BY MOUTH DAILY  60 tablet  3  . glucose blood (ACCU-CHEK SMARTVIEW) test strip Check blood sugar 4x a day before meals and bedtime  2 days each week. Dx code- 250.00.  50 each  12  . aspirin 81 MG EC tablet Take 81 mg by mouth daily.        . metFORMIN (GLUCOPHAGE-XR) 500 MG 24 hr tablet TAKE 4 TABLETS BY MOUTH DAILY DAILY WITH BREAKFAST  120 tablet  3   No current facility-administered medications for this visit.   Family History  Problem Relation Age of Onset  . Diabetes Mother   . Hypertension Mother   . Heart disease Father   . Arthritis Father   . Diabetes Maternal Uncle    History   Social History  . Marital Status: Legally Separated    Spouse Name: N/A    Number of Children: N/A  . Years of Education: N/A   Social History Main Topics  . Smoking status: Never Smoker   . Smokeless tobacco: Never Used  . Alcohol Use: 0.0 oz/week     Comment: occasional  . Drug Use: No     Comment: h/o cocaine abuse, clean since 2009  . Sexually Active: No     Comment: pt. given condoms   Other Topics Concern  . None   Social History  Narrative  . None    Objective:  Physical Exam: Filed Vitals:   09/09/12 1355  BP: 116/79  Pulse: 79  Temp: 96.7 F (35.9 C)  TempSrc: Oral   General: no acute distress, appears as stated age HEENT: PERRL, EOMI, no scleral icterus Cardiac: RRR, no rubs, murmurs or gallops Pulm: clear to auscultation bilaterally, moving normal volumes of air Abd: soft, nontender, nondistended, BS normoactive Ext: warm and well perfused, no pedal edema Neuro: alert and oriented X3, cranial nerves II-XII grossly intact  Assessment & Plan:   Case and care discussed with Dr. Dalphine Handing.  Please see problem oriented charting for further details. Patient to return in 3 months for DM followup.

## 2012-09-09 NOTE — Patient Instructions (Signed)
General Instructions: -Please be sure to have your mammogram and eye exam done, and please have results sent to [place].  -You are doing great with your diabetes!  Your A1c came down from 7.8 --> 7.4.  Please continue taking glipizide 20mg  daily, but also add metformin 500mg  twice daily.  If you cannot tolerate metformin twice daily, at least try to take 500mg  once daily, and we can re-evaluate at your next follow up.  Please be sure to bring all of your medications with you to every visit.  Should you have any new or worsening symptoms, please be sure to call the clinic at [phone].   Treatment Goals:  Goals (1 Years of Data) as of 09/09/12         As of Today 05/27/12 04/08/12 03/18/12 01/22/12     Blood Pressure    . Blood Pressure < 140/90  116/79 108/68 132/86 147/88 132/81     Result Component    . HEMOGLOBIN A1C < 7.0  7.4 7.8   8.3    . LDL CALC < 100    83        Progress Toward Treatment Goals:  Treatment Goal 09/09/2012  Hemoglobin A1C improved    Self Care Goals & Plans:  Self Care Goal 09/09/2012  Manage my medications take my medicines as prescribed; refill my medications on time; bring my medications to every visit  Monitor my health bring my glucose meter and log to each visit; keep track of my blood glucose; keep track of my blood pressure; check my feet daily  Eat healthy foods eat foods that are low in salt; eat baked foods instead of fried foods; drink diet soda or water instead of juice or soda  Be physically active find an activity I enjoy; take a walk every day  Meeting treatment goals maintain the current self-care plan    Home Blood Glucose Monitoring 09/09/2012  Check my blood sugar once a day  When to check my blood sugar before breakfast     Care Management & Community Referrals:  Referral 03/18/2012  Referrals made for care management support diabetes educator

## 2012-09-10 ENCOUNTER — Telehealth: Payer: Self-pay | Admitting: *Deleted

## 2012-09-10 NOTE — Telephone Encounter (Signed)
Metformin prescription should be ?, in note states 4x daily, script to pharm states 2x day w/ meal #90 ? Please review

## 2012-09-10 NOTE — Telephone Encounter (Signed)
I still need to finish my note - she will need to take metformin 500mg  BID. Thanks!

## 2012-09-14 NOTE — Assessment & Plan Note (Signed)
Mammo referral again today.  Eye exam referral again today. Pt would like to have these done in [facility] where she lives, and needs to have them done before her Medicaid expires, which will be soon because her grandchildren have moved out.

## 2012-09-14 NOTE — Assessment & Plan Note (Signed)
Lab Results  Component Value Date   HGBA1C 7.4 09/09/2012   HGBA1C 7.8 05/27/2012   HGBA1C 8.3 01/22/2012     Assessment: Diabetes control: fair control Progress toward A1C goal:  improved  Plan: Medications:  continue glipizide, retry metformin 500mg  daily for one week, then 500 bid and recheck A1c in 3 months; she should try to take the lowest tolerable dose and check her CBGs if she feels badly. Home glucose monitoring: Frequency: once a day Timing: before breakfast Instruction/counseling given: reminded to get eye exam, reminded to bring blood glucose meter & log to each visit, reminded to bring medications to each visit and discussed the need for weight loss Educational resources provided: brochure Self management tools provided: copy of home glucose meter download

## 2012-09-14 NOTE — Assessment & Plan Note (Signed)
Mood improved. Will continue to follow

## 2012-09-17 NOTE — Progress Notes (Signed)
Case discussed with Dr. Sharda at the time of the visit.  We reviewed the resident's history and exam and pertinent patient test results.  I agree with the assessment, diagnosis, and plan of care documented in the resident's note.    

## 2012-09-28 ENCOUNTER — Other Ambulatory Visit: Payer: Self-pay | Admitting: *Deleted

## 2012-09-28 ENCOUNTER — Other Ambulatory Visit (INDEPENDENT_AMBULATORY_CARE_PROVIDER_SITE_OTHER): Payer: Medicaid Other

## 2012-09-28 ENCOUNTER — Ambulatory Visit: Payer: Medicaid Other

## 2012-09-28 DIAGNOSIS — B20 Human immunodeficiency virus [HIV] disease: Secondary | ICD-10-CM

## 2012-09-28 LAB — CBC
HCT: 36.5 % (ref 36.0–46.0)
Hemoglobin: 12.1 g/dL (ref 12.0–15.0)
MCHC: 33.2 g/dL (ref 30.0–36.0)
RBC: 5.27 MIL/uL — ABNORMAL HIGH (ref 3.87–5.11)
WBC: 6.4 10*3/uL (ref 4.0–10.5)

## 2012-09-28 LAB — COMPREHENSIVE METABOLIC PANEL
ALT: 14 U/L (ref 0–35)
CO2: 24 mEq/L (ref 19–32)
Calcium: 9.5 mg/dL (ref 8.4–10.5)
Chloride: 105 mEq/L (ref 96–112)
Glucose, Bld: 135 mg/dL — ABNORMAL HIGH (ref 70–99)
Sodium: 133 mEq/L — ABNORMAL LOW (ref 135–145)
Total Bilirubin: 0.4 mg/dL (ref 0.3–1.2)
Total Protein: 7.1 g/dL (ref 6.0–8.3)

## 2012-09-28 MED ORDER — EMTRICITAB-RILPIVIR-TENOFOV DF 200-25-300 MG PO TABS: 1.0000 | ORAL_TABLET | Freq: Every day | ORAL | Status: DC

## 2012-09-28 NOTE — Addendum Note (Signed)
Addended bySteva Colder on: 09/28/2012 11:34 AM   Modules accepted: Orders

## 2012-09-29 LAB — HIV-1 RNA QUANT-NO REFLEX-BLD
HIV 1 RNA Quant: <20 copies/mL (ref <20)
HIV-1 RNA Quant, Log: <1.3 {Log} (ref <1.30)

## 2012-10-14 ENCOUNTER — Telehealth: Payer: Self-pay | Admitting: *Deleted

## 2012-10-14 ENCOUNTER — Ambulatory Visit: Payer: Medicaid Other | Admitting: Infectious Diseases

## 2012-10-14 NOTE — Telephone Encounter (Signed)
Called patient to reschedule her appt, she no showed today. Left voice mail to call the clinic to reschedule. Wendall Mola

## 2012-11-23 ENCOUNTER — Encounter: Payer: Self-pay | Admitting: Infectious Diseases

## 2012-11-23 ENCOUNTER — Ambulatory Visit (INDEPENDENT_AMBULATORY_CARE_PROVIDER_SITE_OTHER): Payer: Medicaid Other | Admitting: Infectious Diseases

## 2012-11-23 VITALS — BP 133/86 | HR 77 | Temp 97.7°F | Ht 63.0 in | Wt 170.0 lb

## 2012-11-23 DIAGNOSIS — Z79899 Other long term (current) drug therapy: Secondary | ICD-10-CM

## 2012-11-23 DIAGNOSIS — Z23 Encounter for immunization: Secondary | ICD-10-CM

## 2012-11-23 DIAGNOSIS — Z113 Encounter for screening for infections with a predominantly sexual mode of transmission: Secondary | ICD-10-CM

## 2012-11-23 DIAGNOSIS — IMO0002 Reserved for concepts with insufficient information to code with codable children: Secondary | ICD-10-CM

## 2012-11-23 DIAGNOSIS — E1140 Type 2 diabetes mellitus with diabetic neuropathy, unspecified: Secondary | ICD-10-CM

## 2012-11-23 DIAGNOSIS — E1149 Type 2 diabetes mellitus with other diabetic neurological complication: Secondary | ICD-10-CM

## 2012-11-23 DIAGNOSIS — E1142 Type 2 diabetes mellitus with diabetic polyneuropathy: Secondary | ICD-10-CM

## 2012-11-23 DIAGNOSIS — B20 Human immunodeficiency virus [HIV] disease: Secondary | ICD-10-CM

## 2012-11-23 NOTE — Assessment & Plan Note (Signed)
She is doing very well. Needs flu shot. Needs to restart Hep B series. Offered/refuses condoms. Will get her mammo. rtc 6 months.

## 2012-11-23 NOTE — Progress Notes (Signed)
  Subjective:    Patient ID: Ashley Vaughn, female    DOB: [birth year], 47 y.o.   MRN: [id number]  HPI 47 yo F with hx of DM and HIV+. She has been on complera since 12-2010. Has been seen in [place] for her DM2 (dx 2002). Feels like her Dm has been getting worse "flared up again". Was better previously. Feels like her kids got it out of whack.  No problems with complera.   HIV 1 RNA Quant (copies/mL)  Date Value  09/28/2012 <20   04/08/2012 <20   04/03/2011 <20      CD4 T Cell Abs (cmm)  Date Value  09/28/2012 1390   04/08/2012 1580   04/03/2011 1680    Review of Systems  Constitutional: Negative for fever, chills, appetite change and unexpected weight change.  Gastrointestinal: Negative for diarrhea and constipation.  Genitourinary: Negative for dysuria.  hysteretomy. Has not seen ophtho this year.      Objective:   Physical Exam  Constitutional: She appears well-developed and well-nourished.  HENT:  Mouth/Throat: No oropharyngeal exudate.  Eyes: EOM are normal. Pupils are equal, round, and reactive to light.  Neck: Neck supple.  Cardiovascular: Normal rate, regular rhythm and normal heart sounds.   Pulmonary/Chest: Effort normal and breath sounds normal.  Abdominal: Soft. Bowel sounds are normal. She exhibits no distension. There is no tenderness.  Musculoskeletal:  Feet have no diabetic lesions, normal light touch.    Lymphadenopathy:    She has no cervical adenopathy.  Neurological: No sensory deficit.          Assessment & Plan:

## 2012-11-23 NOTE — Assessment & Plan Note (Signed)
She has f/u with Dr Everardo Beals this month. Will get her ophtho eval, mammogram.

## 2012-11-24 ENCOUNTER — Telehealth: Payer: Self-pay | Admitting: *Deleted

## 2012-11-24 NOTE — Telephone Encounter (Signed)
Called patient and left her a voicemail regarding her appt with [facility] for 12/09/12 at 10:00 AM. She needs to bring her Medicaid card and her $3 copay, they will not refuse to see her if she does not have the copay. Wendall Mola

## 2012-12-02 ENCOUNTER — Other Ambulatory Visit: Payer: Self-pay | Admitting: Infectious Disease

## 2012-12-02 DIAGNOSIS — B20 Human immunodeficiency virus [HIV] disease: Secondary | ICD-10-CM

## 2012-12-09 ENCOUNTER — Encounter: Payer: Medicaid Other | Admitting: Internal Medicine

## 2012-12-16 ENCOUNTER — Encounter: Payer: Self-pay | Admitting: Internal Medicine

## 2012-12-16 ENCOUNTER — Telehealth: Payer: Self-pay | Admitting: Dietician

## 2012-12-16 ENCOUNTER — Encounter: Payer: Medicaid Other | Admitting: Internal Medicine

## 2012-12-16 NOTE — Telephone Encounter (Signed)
Patient did NOT show for 12-09-12 diabetes eye exam at Dr. Acquanetta Sit office. Could consider retinal images in our office.

## 2012-12-24 ENCOUNTER — Other Ambulatory Visit: Payer: Self-pay

## 2012-12-29 ENCOUNTER — Other Ambulatory Visit: Payer: Self-pay | Admitting: Internal Medicine

## 2013-02-23 ENCOUNTER — Encounter: Payer: Self-pay | Admitting: Internal Medicine

## 2013-05-19 ENCOUNTER — Other Ambulatory Visit: Payer: Self-pay | Admitting: Infectious Diseases

## 2013-05-25 ENCOUNTER — Other Ambulatory Visit: Payer: Medicaid Other

## 2013-05-27 ENCOUNTER — Other Ambulatory Visit: Payer: Self-pay

## 2013-05-27 ENCOUNTER — Other Ambulatory Visit: Payer: Medicaid Other

## 2013-06-02 ENCOUNTER — Other Ambulatory Visit: Payer: Medicaid Other

## 2013-06-02 DIAGNOSIS — Z113 Encounter for screening for infections with a predominantly sexual mode of transmission: Secondary | ICD-10-CM

## 2013-06-02 DIAGNOSIS — B20 Human immunodeficiency virus [HIV] disease: Secondary | ICD-10-CM

## 2013-06-02 DIAGNOSIS — Z79899 Other long term (current) drug therapy: Secondary | ICD-10-CM

## 2013-06-02 LAB — COMPLETE METABOLIC PANEL WITH GFR
ALBUMIN: 4.1 g/dL (ref 3.5–5.2)
ALT: 19 U/L (ref 0–35)
AST: 18 U/L (ref 0–37)
Alkaline Phosphatase: 103 U/L (ref 39–117)
BILIRUBIN TOTAL: 0.2 mg/dL (ref 0.2–1.2)
BUN: 12 mg/dL (ref 6–23)
CHLORIDE: 101 meq/L (ref 96–112)
CO2: 26 meq/L (ref 19–32)
Calcium: 9.7 mg/dL (ref 8.4–10.5)
Creat: 0.89 mg/dL (ref 0.50–1.10)
GFR, EST AFRICAN AMERICAN: 89 mL/min
GFR, Est Non African American: 77 mL/min
Glucose, Bld: 235 mg/dL — ABNORMAL HIGH (ref 70–99)
POTASSIUM: 4.4 meq/L (ref 3.5–5.3)
SODIUM: 135 meq/L (ref 135–145)
TOTAL PROTEIN: 7.5 g/dL (ref 6.0–8.3)

## 2013-06-02 LAB — CBC WITH DIFFERENTIAL/PLATELET
Basophils Absolute: 0 10*3/uL (ref 0.0–0.1)
Basophils Relative: 0 % (ref 0–1)
EOS ABS: 0.1 10*3/uL (ref 0.0–0.7)
Eosinophils Relative: 1 % (ref 0–5)
HCT: 41.9 % (ref 36.0–46.0)
HEMOGLOBIN: 13.6 g/dL (ref 12.0–15.0)
LYMPHS ABS: 3.4 10*3/uL (ref 0.7–4.0)
LYMPHS PCT: 57 % — AB (ref 12–46)
MCH: 22.6 pg — ABNORMAL LOW (ref 26.0–34.0)
MCHC: 32.5 g/dL (ref 30.0–36.0)
MCV: 69.5 fL — ABNORMAL LOW (ref 78.0–100.0)
MONOS PCT: 9 % (ref 3–12)
Monocytes Absolute: 0.5 10*3/uL (ref 0.1–1.0)
NEUTROS ABS: 1.9 10*3/uL (ref 1.7–7.7)
NEUTROS PCT: 33 % — AB (ref 43–77)
PLATELETS: 313 10*3/uL (ref 150–400)
RBC: 6.03 MIL/uL — AB (ref 3.87–5.11)
RDW: 15.6 % — ABNORMAL HIGH (ref 11.5–15.5)
WBC: 5.9 10*3/uL (ref 4.0–10.5)

## 2013-06-02 LAB — LIPID PANEL
CHOL/HDL RATIO: 3.9 ratio
CHOLESTEROL: 170 mg/dL (ref 0–200)
HDL: 44 mg/dL (ref >39)
LDL CALC: 98 mg/dL (ref 0–99)
Triglycerides: 142 mg/dL (ref <150)
VLDL: 28 mg/dL (ref 0–40)

## 2013-06-03 LAB — T-HELPER CELL (CD4) - (RCID CLINIC ONLY)
CD4 T CELL ABS: 1560 /uL (ref 400–2700)
CD4 T CELL HELPER: 45 % (ref 33–55)

## 2013-06-03 LAB — HIV-1 RNA QUANT-NO REFLEX-BLD
HIV 1 RNA Quant: <20 copies/mL (ref <20)
HIV-1 RNA Quant, Log: <1.3 {Log} (ref <1.30)

## 2013-06-03 LAB — RPR

## 2013-06-08 ENCOUNTER — Telehealth: Payer: Self-pay | Admitting: *Deleted

## 2013-06-08 NOTE — Telephone Encounter (Signed)
Patient called wanting to know her viral load and Cd4, from 06/02/13. She said her stomach feels like "bricks" and has not had a bowel movement in two days; also c/o dizziness. Told her that her HIV labs are good and she should increase her water intake and fiber. Also asked her if she had been taking her diabetes medicine and she said not the metformin. She [place] [facility] [place] and advised her to call their office to make an appt regarding her dizziness. Wendall MolaJacqueline Nayshawn Mesta

## 2013-06-09 ENCOUNTER — Ambulatory Visit: Payer: Medicaid Other | Admitting: Infectious Diseases

## 2013-06-17 ENCOUNTER — Ambulatory Visit: Payer: Medicaid Other | Admitting: Infectious Diseases

## 2013-07-26 ENCOUNTER — Ambulatory Visit: Payer: Medicaid Other | Admitting: Infectious Diseases

## 2013-07-27 ENCOUNTER — Telehealth: Payer: Self-pay | Admitting: *Deleted

## 2013-07-27 NOTE — Telephone Encounter (Signed)
Attempted to call patient to reschedule her appt, and her phone is not in service at this time. Patient had labs done on 06/02/13. Wendall Mola

## 2013-09-08 ENCOUNTER — Ambulatory Visit (INDEPENDENT_AMBULATORY_CARE_PROVIDER_SITE_OTHER): Payer: Medicaid Other | Admitting: Infectious Diseases

## 2013-09-08 ENCOUNTER — Encounter: Payer: Self-pay | Admitting: Infectious Diseases

## 2013-09-08 VITALS — BP 139/87 | HR 80 | Temp 97.6°F | Ht 63.0 in | Wt 166.8 lb

## 2013-09-08 DIAGNOSIS — IMO0002 Reserved for concepts with insufficient information to code with codable children: Secondary | ICD-10-CM

## 2013-09-08 DIAGNOSIS — E1142 Type 2 diabetes mellitus with diabetic polyneuropathy: Secondary | ICD-10-CM

## 2013-09-08 DIAGNOSIS — E1149 Type 2 diabetes mellitus with other diabetic neurological complication: Secondary | ICD-10-CM

## 2013-09-08 DIAGNOSIS — E669 Obesity, unspecified: Secondary | ICD-10-CM

## 2013-09-08 DIAGNOSIS — E1165 Type 2 diabetes mellitus with hyperglycemia: Secondary | ICD-10-CM

## 2013-09-08 DIAGNOSIS — E1140 Type 2 diabetes mellitus with diabetic neuropathy, unspecified: Secondary | ICD-10-CM

## 2013-09-08 DIAGNOSIS — B20 Human immunodeficiency virus [HIV] disease: Secondary | ICD-10-CM

## 2013-09-08 DIAGNOSIS — Z23 Encounter for immunization: Secondary | ICD-10-CM

## 2013-09-08 NOTE — Progress Notes (Signed)
   Subjective:    Patient ID: Ashley RaiderLisa M Tyminski, female    DOB: Mar 24, 1965, 48 y.o.   MRN: [id number]  HPI 48 yo F with hx of DM and HIV+. She has been on complera since 12-2010. Has been seen in [place] for her DM2 (dx 2002). Has lost wt. Has changed her DM tx to taking qday only. Has been having pain in her back. She is going to be "tested in [place] to see if it's my kidneys". FSG this 160.  Ophtho- scheduled for this year.  PAP- had at health dept 3 weeks ago. (-).  No problems with complera.   HIV 1 RNA Quant (copies/mL)  Date Value  06/02/2013 <20   09/28/2012 <20   04/08/2012 <20      CD4 T Cell Abs (/uL)  Date Value  06/02/2013 1560   09/28/2012 1390   04/08/2012 1580     Review of Systems  Constitutional: Negative for appetite change and unexpected weight change.  Eyes: Negative for visual disturbance.  Gastrointestinal: Negative for diarrhea and constipation.  Genitourinary: Negative for vaginal bleeding, difficulty urinating and menstrual problem.  Musculoskeletal: Positive for back pain.  Neurological: Positive for numbness.  ammenorrheic.      Objective:   Physical Exam  Constitutional: She appears well-developed and well-nourished.  HENT:  Mouth/Throat: No oropharyngeal exudate.  Eyes: EOM are normal. Pupils are equal, round, and reactive to light.  Neck: Neck supple.  Cardiovascular: Normal rate, regular rhythm and normal heart sounds.   Pulmonary/Chest: Effort normal and breath sounds normal.  Abdominal: Soft. Bowel sounds are normal. She exhibits no distension. There is no tenderness.  Musculoskeletal: She exhibits no edema.  Lymphadenopathy:    She has no cervical adenopathy.  Neurological:  Grossly normal light touch.   Skin:  No diabetic foot lesions, dirty.           Assessment & Plan:

## 2013-09-08 NOTE — Assessment & Plan Note (Signed)
Will restart hep B series. Gets PNVX booster today.  She is doing very well. She is given condoms.  Will see her back in 6 months with labs prior.

## 2013-09-08 NOTE — Assessment & Plan Note (Signed)
She has f/u in [place]. Will get ophtho. hopefully she will continue to loose wt. Med adjustment per her primary.

## 2013-09-18 ENCOUNTER — Other Ambulatory Visit: Payer: Self-pay | Admitting: Infectious Diseases

## 2013-10-11 ENCOUNTER — Ambulatory Visit: Payer: Medicaid Other

## 2013-10-13 ENCOUNTER — Ambulatory Visit: Payer: Medicaid Other

## 2014-02-07 ENCOUNTER — Other Ambulatory Visit: Payer: Medicaid Other

## 2014-02-07 DIAGNOSIS — B20 Human immunodeficiency virus [HIV] disease: Secondary | ICD-10-CM

## 2014-02-07 LAB — CBC
HCT: 38.4 % (ref 36.0–46.0)
Hemoglobin: 12.1 g/dL (ref 12.0–15.0)
MCH: 22.3 pg — ABNORMAL LOW (ref 26.0–34.0)
MCHC: 31.5 g/dL (ref 30.0–36.0)
MCV: 70.8 fL — ABNORMAL LOW (ref 78.0–100.0)
MPV: 9.6 fL (ref 9.4–12.4)
Platelets: 322 10*3/uL (ref 150–400)
RBC: 5.42 MIL/uL — ABNORMAL HIGH (ref 3.87–5.11)
RDW: 15.7 % — AB (ref 11.5–15.5)
WBC: 5.8 10*3/uL (ref 4.0–10.5)

## 2014-02-07 LAB — COMPREHENSIVE METABOLIC PANEL
ALK PHOS: 74 U/L (ref 39–117)
ALT: 17 U/L (ref 0–35)
AST: 15 U/L (ref 0–37)
Albumin: 4.2 g/dL (ref 3.5–5.2)
BUN: 12 mg/dL (ref 6–23)
CALCIUM: 9.4 mg/dL (ref 8.4–10.5)
CO2: 25 mEq/L (ref 19–32)
CREATININE: 0.83 mg/dL (ref 0.50–1.10)
Chloride: 105 mEq/L (ref 96–112)
Glucose, Bld: 192 mg/dL — ABNORMAL HIGH (ref 70–99)
Potassium: 4 mEq/L (ref 3.5–5.3)
Sodium: 139 mEq/L (ref 135–145)
Total Bilirubin: 0.3 mg/dL (ref 0.2–1.2)
Total Protein: 7.3 g/dL (ref 6.0–8.3)

## 2014-02-08 ENCOUNTER — Other Ambulatory Visit: Payer: Medicaid Other

## 2014-02-08 LAB — T-HELPER CELL (CD4) - (RCID CLINIC ONLY)
CD4 T CELL HELPER: 43 % (ref 33–55)
CD4 T Cell Abs: 1280 /uL (ref 400–2700)

## 2014-02-08 LAB — HIV-1 RNA QUANT-NO REFLEX-BLD: HIV-1 RNA Quant, Log: <1.3 {Log} (ref <1.30)

## 2014-02-21 ENCOUNTER — Ambulatory Visit: Payer: Medicaid Other | Admitting: Infectious Diseases

## 2014-03-03 ENCOUNTER — Encounter: Payer: Self-pay | Admitting: Internal Medicine

## 2014-03-03 ENCOUNTER — Ambulatory Visit: Payer: Medicaid Other | Admitting: Infectious Diseases

## 2014-03-03 ENCOUNTER — Ambulatory Visit (INDEPENDENT_AMBULATORY_CARE_PROVIDER_SITE_OTHER): Payer: Medicaid Other | Admitting: Internal Medicine

## 2014-03-03 VITALS — BP 128/82 | HR 67 | Temp 97.6°F | Wt 168.0 lb

## 2014-03-03 DIAGNOSIS — Z113 Encounter for screening for infections with a predominantly sexual mode of transmission: Secondary | ICD-10-CM | POA: Insufficient documentation

## 2014-03-03 DIAGNOSIS — Z23 Encounter for immunization: Secondary | ICD-10-CM

## 2014-03-03 DIAGNOSIS — B20 Human immunodeficiency virus [HIV] disease: Secondary | ICD-10-CM

## 2014-03-03 DIAGNOSIS — Z79899 Other long term (current) drug therapy: Secondary | ICD-10-CM | POA: Insufficient documentation

## 2014-03-03 NOTE — Progress Notes (Signed)
   Subjective:    Patient ID: Ashley RaiderLisa M Campoverde, female    DOB: 1965-06-16, 49 y.o.   MRN: [id number]  HPI She is here for follow-up of HIV. She is on Complera which she has been taking since November 2012. She follows up with the primary physician for her diabetes. Her CD4 is 1280 with an undetectable viral load which is been persistent since starting Complera.   Having some hot flashes. She does have a history of a hysterectomy. Her blood sugars also have been up. She is given a discussed these issues with her primary physician.   Review of Systems  Constitutional: Negative for fatigue.  HENT: Negative for trouble swallowing.   Gastrointestinal: Negative for nausea, abdominal pain and diarrhea.  Skin: Negative for rash.  Neurological: Negative for dizziness and light-headedness.       Objective:   Physical Exam  Constitutional: She appears well-developed and well-nourished. No distress.  HENT:  Mouth/Throat: No oropharyngeal exudate.  Eyes: No scleral icterus.  Cardiovascular: Normal rate, regular rhythm and normal heart sounds.   No murmur heard. Pulmonary/Chest: Effort normal and breath sounds normal. No respiratory distress.  Lymphadenopathy:    She has no cervical adenopathy.  Skin: No rash noted.          Assessment & Plan:

## 2014-03-03 NOTE — Assessment & Plan Note (Signed)
She is doing great and she can return in 6 months with labs before. Only one or 2 missed doses since her last visit.

## 2014-04-07 ENCOUNTER — Ambulatory Visit: Payer: Medicaid Other | Admitting: Internal Medicine

## 2014-04-29 ENCOUNTER — Other Ambulatory Visit: Payer: Self-pay | Admitting: Infectious Diseases

## 2014-05-23 ENCOUNTER — Ambulatory Visit: Payer: Medicaid Other | Admitting: Infectious Diseases

## 2014-05-30 ENCOUNTER — Encounter: Payer: Self-pay | Admitting: Infectious Diseases

## 2014-05-30 ENCOUNTER — Ambulatory Visit (INDEPENDENT_AMBULATORY_CARE_PROVIDER_SITE_OTHER): Payer: Medicaid Other | Admitting: Infectious Diseases

## 2014-05-30 VITALS — BP 132/85 | HR 73 | Temp 97.7°F | Ht 63.0 in | Wt 163.0 lb

## 2014-05-30 DIAGNOSIS — E1165 Type 2 diabetes mellitus with hyperglycemia: Secondary | ICD-10-CM | POA: Diagnosis not present

## 2014-05-30 DIAGNOSIS — Z0000 Encounter for general adult medical examination without abnormal findings: Secondary | ICD-10-CM | POA: Diagnosis not present

## 2014-05-30 DIAGNOSIS — B20 Human immunodeficiency virus [HIV] disease: Secondary | ICD-10-CM

## 2014-05-30 DIAGNOSIS — IMO0002 Reserved for concepts with insufficient information to code with codable children: Secondary | ICD-10-CM

## 2014-05-30 DIAGNOSIS — E1140 Type 2 diabetes mellitus with diabetic neuropathy, unspecified: Secondary | ICD-10-CM

## 2014-05-30 DIAGNOSIS — L03116 Cellulitis of left lower limb: Secondary | ICD-10-CM | POA: Diagnosis not present

## 2014-05-30 MED ORDER — SULFAMETHOXAZOLE-TRIMETHOPRIM 400-80 MG PO TABS: 1.0000 | ORAL_TABLET | Freq: Two times a day (BID) | ORAL | Status: DC

## 2014-05-30 NOTE — Assessment & Plan Note (Signed)
Will give her rx for bactrim for 10 days.

## 2014-05-30 NOTE — Progress Notes (Signed)
   Subjective:    Patient ID: Ashley Vaughn, female    DOB: 02/07/1966, 49 y.o.   MRN: [id number]  HPI 49 yo F with hx of DM2 (dx 2002) and HIV+. She has been on complera since 12-2010.  Has been feeling well. FSG have been ? , her batteries in her meter have died. Appetite has been off. Wt has been steady.  ART has been ok, not always with food. Likes to take at night with her other meds.   HIV 1 RNA QUANT (copies/mL)  Date Value  02/07/2014 <20  06/02/2013 <20  09/28/2012 <20   CD4 T CELL ABS  Date Value  02/07/2014 1280 /uL  06/02/2013 1560 /uL  09/28/2012 1390 cmm   PAP- last done at [facility] de[facility]. Had hysterectomy.  Ophtho- needs appt.  mammo- needs  Was told recently she is depressed. States she will start rx soon. She has not felt this.   Review of Systems  Constitutional: Negative for appetite change and unexpected weight change.  Gastrointestinal: Negative for diarrhea and constipation.  Genitourinary: Negative for difficulty urinating and menstrual problem.       Objective:   Physical Exam  Constitutional: She appears well-developed and well-nourished.  HENT:  Mouth/Throat: No oropharyngeal exudate.  Eyes: EOM are normal. Pupils are equal, round, and reactive to light.  Neck: Neck supple.  Cardiovascular: Normal rate, regular rhythm and normal heart sounds.   Pulmonary/Chest: Effort normal and breath sounds normal.  Abdominal: Soft. Bowel sounds are normal. She exhibits no distension. There is no tenderness.  Musculoskeletal:  She has a 1 cm crusted wound on the back of he rL ankle. There is a small amt of pus express-able. Tender.   Lymphadenopathy:    She has no cervical adenopathy.  Neurological:  Normal light touch BLE          Assessment & Plan:

## 2014-05-30 NOTE — Assessment & Plan Note (Signed)
Greatly appreciate PCP f/u.  

## 2014-05-30 NOTE — Assessment & Plan Note (Signed)
Will get her set up for dental.  Will get her set up for mammo Will get her set up for ophtho

## 2014-05-30 NOTE — Assessment & Plan Note (Addendum)
She is doing well despite atking complera without food.  Will see her back in 6 months.  Offered/refused condoms.  Needs to restart hep b series.

## 2014-05-31 ENCOUNTER — Other Ambulatory Visit: Payer: Self-pay | Admitting: Infectious Diseases

## 2014-05-31 ENCOUNTER — Telehealth: Payer: Self-pay | Admitting: *Deleted

## 2014-05-31 DIAGNOSIS — Z1231 Encounter for screening mammogram for malignant neoplasm of breast: Secondary | ICD-10-CM

## 2014-05-31 NOTE — Telephone Encounter (Signed)
Left message at patient's home number with the following referral information. Dental clinic Monday 4/18.  Patient made the appointment during her office visit, aware. [facility] [phone] Wed 4/20 1:30  [facility] [phone] t 5/5 2:00  Bring medicaid card, $3 Patient's daughter will have the patient call back and confirm.  She will be here Wednesday 4/13 to meet with Amber in housing.  Will leave message with Amber as well. Andree CossHowell, Pape Parson M, RN

## 2014-06-06 ENCOUNTER — Ambulatory Visit: Payer: Medicaid Other

## 2014-06-08 ENCOUNTER — Ambulatory Visit: Payer: Medicaid Other

## 2014-06-21 ENCOUNTER — Other Ambulatory Visit: Payer: Self-pay | Admitting: Infectious Diseases

## 2014-07-26 ENCOUNTER — Other Ambulatory Visit: Payer: Self-pay | Admitting: Infectious Diseases

## 2014-07-26 DIAGNOSIS — B20 Human immunodeficiency virus [HIV] disease: Secondary | ICD-10-CM

## 2014-11-21 ENCOUNTER — Other Ambulatory Visit: Payer: Self-pay | Admitting: Infectious Diseases

## 2014-11-22 ENCOUNTER — Other Ambulatory Visit (HOSPITAL_COMMUNITY)
Admission: RE | Admit: 2014-11-22 | Discharge: 2014-11-22 | Disposition: A | Discharge status: Home / Self Care | Payer: Medicaid Other | Source: Ambulatory Visit | Attending: Infectious Diseases | Admitting: Infectious Diseases

## 2014-11-22 ENCOUNTER — Other Ambulatory Visit: Payer: Medicaid Other

## 2014-11-22 DIAGNOSIS — Z79899 Other long term (current) drug therapy: Secondary | ICD-10-CM

## 2014-11-22 DIAGNOSIS — Z113 Encounter for screening for infections with a predominantly sexual mode of transmission: Secondary | ICD-10-CM

## 2014-11-22 DIAGNOSIS — B20 Human immunodeficiency virus [HIV] disease: Secondary | ICD-10-CM

## 2014-11-22 LAB — LIPID PANEL
CHOLESTEROL: 156 mg/dL (ref 125–200)
HDL: 43 mg/dL — ABNORMAL LOW (ref >=46)
LDL Cholesterol: 88 mg/dL (ref <130)
TRIGLYCERIDES: 125 mg/dL (ref <150)
Total CHOL/HDL Ratio: 3.6 Ratio (ref <=5.0)
VLDL: 25 mg/dL (ref <30)

## 2014-11-22 LAB — CBC WITH DIFFERENTIAL/PLATELET
BASOS PCT: 0 % (ref 0–1)
Basophils Absolute: 0 10*3/uL (ref 0.0–0.1)
EOS PCT: 2 % (ref 0–5)
Eosinophils Absolute: 0.1 10*3/uL (ref 0.0–0.7)
HCT: 36.8 % (ref 36.0–46.0)
Hemoglobin: 11.5 g/dL — ABNORMAL LOW (ref 12.0–15.0)
LYMPHS ABS: 3.5 10*3/uL (ref 0.7–4.0)
Lymphocytes Relative: 59 % — ABNORMAL HIGH (ref 12–46)
MCH: 22.4 pg — ABNORMAL LOW (ref 26.0–34.0)
MCHC: 31.3 g/dL (ref 30.0–36.0)
MCV: 71.6 fL — AB (ref 78.0–100.0)
MPV: 9.6 fL (ref 8.6–12.4)
Monocytes Absolute: 0.4 10*3/uL (ref 0.1–1.0)
Monocytes Relative: 7 % (ref 3–12)
Neutro Abs: 1.9 10*3/uL (ref 1.7–7.7)
Neutrophils Relative %: 32 % — ABNORMAL LOW (ref 43–77)
PLATELETS: 321 10*3/uL (ref 150–400)
RBC: 5.14 MIL/uL — AB (ref 3.87–5.11)
RDW: 14.9 % (ref 11.5–15.5)
WBC: 6 10*3/uL (ref 4.0–10.5)

## 2014-11-22 LAB — COMPLETE METABOLIC PANEL WITH GFR
ALK PHOS: 60 U/L (ref 33–115)
ALT: 17 U/L (ref 6–29)
AST: 14 U/L (ref 10–35)
Albumin: 4 g/dL (ref 3.6–5.1)
BILIRUBIN TOTAL: 0.3 mg/dL (ref 0.2–1.2)
BUN: 12 mg/dL (ref 7–25)
CALCIUM: 9.5 mg/dL (ref 8.6–10.2)
CO2: 25 mmol/L (ref 20–31)
CREATININE: 0.87 mg/dL (ref 0.50–1.10)
Chloride: 103 mmol/L (ref 98–110)
GFR, Est Non African American: 79 mL/min (ref >=60)
Glucose, Bld: 199 mg/dL — ABNORMAL HIGH (ref 65–99)
Potassium: 4.1 mmol/L (ref 3.5–5.3)
Sodium: 136 mmol/L (ref 135–146)
TOTAL PROTEIN: 7 g/dL (ref 6.1–8.1)

## 2014-11-23 LAB — HIV-1 RNA QUANT-NO REFLEX-BLD
HIV 1 RNA Quant: <20 copies/mL (ref <20)
HIV-1 RNA Quant, Log: <1.3 {Log} (ref <1.30)

## 2014-11-23 LAB — URINE CYTOLOGY ANCILLARY ONLY
CHLAMYDIA, DNA PROBE: NEGATIVE
NEISSERIA GONORRHEA: NEGATIVE

## 2014-11-23 LAB — RPR

## 2014-11-23 LAB — T-HELPER CELL (CD4) - (RCID CLINIC ONLY)
CD4 T CELL HELPER: 46 % (ref 33–55)
CD4 T Cell Abs: 1570 /uL (ref 400–2700)

## 2014-12-05 ENCOUNTER — Ambulatory Visit: Payer: Medicaid Other | Admitting: Infectious Diseases

## 2015-01-09 ENCOUNTER — Ambulatory Visit: Payer: Medicaid Other | Admitting: Infectious Diseases

## 2015-01-18 ENCOUNTER — Ambulatory Visit: Payer: Medicaid Other | Admitting: Infectious Diseases

## 2015-02-03 ENCOUNTER — Other Ambulatory Visit: Payer: Self-pay | Admitting: Infectious Diseases

## 2015-02-21 ENCOUNTER — Ambulatory Visit (INDEPENDENT_AMBULATORY_CARE_PROVIDER_SITE_OTHER): Payer: Medicaid Other | Admitting: Infectious Diseases

## 2015-02-21 ENCOUNTER — Encounter: Payer: Self-pay | Admitting: Infectious Diseases

## 2015-02-21 VITALS — BP 128/84 | HR 76 | Temp 98.2°F | Ht 62.0 in | Wt 164.5 lb

## 2015-02-21 DIAGNOSIS — Z23 Encounter for immunization: Secondary | ICD-10-CM

## 2015-02-21 DIAGNOSIS — B20 Human immunodeficiency virus [HIV] disease: Secondary | ICD-10-CM | POA: Diagnosis present

## 2015-02-21 DIAGNOSIS — Z113 Encounter for screening for infections with a predominantly sexual mode of transmission: Secondary | ICD-10-CM

## 2015-02-21 DIAGNOSIS — E1165 Type 2 diabetes mellitus with hyperglycemia: Secondary | ICD-10-CM | POA: Diagnosis not present

## 2015-02-21 DIAGNOSIS — Z79899 Other long term (current) drug therapy: Secondary | ICD-10-CM

## 2015-02-21 DIAGNOSIS — E1140 Type 2 diabetes mellitus with diabetic neuropathy, unspecified: Secondary | ICD-10-CM | POA: Diagnosis not present

## 2015-02-21 DIAGNOSIS — IMO0002 Reserved for concepts with insufficient information to code with codable children: Secondary | ICD-10-CM

## 2015-02-21 MED ORDER — EMTRICITAB-RILPIVIR-TENOFOV AF 200-25-25 MG PO TABS: 1.0000 | ORAL_TABLET | Freq: Every day | ORAL | Status: DC

## 2015-02-21 NOTE — Assessment & Plan Note (Addendum)
Will try to get her in with ophtho Appreciate IM f/u.  Start insulin soon

## 2015-02-21 NOTE — Progress Notes (Signed)
   Subjective:    Patient ID: Ashley RaiderLisa M Carrigan, female    DOB: Mar 16, 1965, 50 y.o.   MRN: [id number]  HPI 50 yo F with hx of DM2 (dx 2002) and HIV+. She has been on complera since 12-2010.  Has been feeling well but states he A1C was 11%. Expects to be started on insulin soon.  Has not seen ophtho in last year, vision is more blurry.  Last PAP was? (2013), has had hyst  No problems with ART.  Needs flu and Hep B  HIV 1 RNA QUANT (copies/mL)  Date Value  11/22/2014 <20  02/07/2014 <20  06/02/2013 <20   CD4 T CELL ABS (/uL)  Date Value  11/22/2014 1570  02/07/2014 1280  06/02/2013 1560    Review of Systems  Constitutional: Negative for activity change and unexpected weight change.  Gastrointestinal: Negative for diarrhea and constipation.  Genitourinary: Negative for difficulty urinating and menstrual problem.  Please see HPI. 12 point ROS o/w (-)      Objective:   Physical Exam  Constitutional: She appears well-developed and well-nourished.  HENT:  Mouth/Throat: No oropharyngeal exudate.  Eyes: EOM are normal. Pupils are equal, round, and reactive to light.  Neck: Neck supple.  Cardiovascular: Normal rate, regular rhythm and normal heart sounds.   Pulmonary/Chest: Effort normal and breath sounds normal.  Abdominal: Soft. Bowel sounds are normal. There is no tenderness. There is no rebound.  Musculoskeletal: She exhibits no edema.  No foot lesions.           Assessment & Plan:

## 2015-02-21 NOTE — Addendum Note (Signed)
Addended by: Jennet MaduroESTRIDGE, Aracelie Addis D on: 02/21/2015 03:57 PM   Modules accepted: Orders

## 2015-02-21 NOTE — Assessment & Plan Note (Signed)
Flu shot and Hep B #2 double dose start today.  Schedule pap Change to odefsy Needs dental Will see her back in 4-5 months  Given condoms.

## 2015-03-03 ENCOUNTER — Ambulatory Visit: Payer: Medicaid Other

## 2015-03-10 ENCOUNTER — Ambulatory Visit: Payer: Medicaid Other

## 2015-03-10 ENCOUNTER — Telehealth: Payer: Self-pay | Admitting: *Deleted

## 2015-03-10 NOTE — Telephone Encounter (Signed)
Requested pt call to make a new PAP smear appt.

## 2015-05-01 ENCOUNTER — Telehealth: Payer: Self-pay | Admitting: *Deleted

## 2015-05-01 NOTE — Telephone Encounter (Signed)
Patient called to ask if we do ppd test for employment. Advised her we do not and she said that she called the Health Dept and it is $38.00 and she does not have it. Spoke to Dr. Ninetta LightsHatcher and he said it was ok to do it here at the clinic. Patient will come tomorrow and have it read on Thursday 05/04/15. Wendall MolaJacqueline Giorgia Wahler

## 2015-05-02 ENCOUNTER — Encounter: Payer: Self-pay | Admitting: *Deleted

## 2015-05-02 ENCOUNTER — Other Ambulatory Visit: Payer: Self-pay | Admitting: *Deleted

## 2015-05-02 ENCOUNTER — Ambulatory Visit
Admission: RE | Admit: 2015-05-02 | Discharge: 2015-05-02 | Disposition: A | Discharge status: Home / Self Care | Payer: Medicaid Other | Source: Ambulatory Visit | Attending: Internal Medicine | Admitting: Internal Medicine

## 2015-05-02 ENCOUNTER — Ambulatory Visit: Payer: Medicaid Other

## 2015-05-02 DIAGNOSIS — R7611 Nonspecific reaction to tuberculin skin test without active tuberculosis: Secondary | ICD-10-CM

## 2015-05-02 NOTE — Telephone Encounter (Signed)
Patient arrived for skin test. No testing material available for subcutaneous injection. Patient has a history of positive ppd, she is unsure if she has had treatment.  She states that she gets a letter from the health department every year clearing her to work, but her messages this year were unanswered. She needs clearance by Friday 3/17.   PPD skin tests are contraindicated. RN consulted clinic doctor. [facility] for [facility] XRay per Dr. Drue SecondSnider.  RN left message for Tammy at [facility] to see if the patient has ever been treated for latent tb.  Patient will need to bring in the health survey for an MD to fill out for clearance.  She is aware of this, says she has a copy at home. Andree CossHowell, Rudi Bunyard M, RN

## 2015-05-03 NOTE — Telephone Encounter (Signed)
Spoke with Ashley Vaughn at the health department. Ashley Vaughn has been coming there for many years for medical clearance.  She will get in touch with Ashley Vaughn to renew this clearance, working her in before Friday. Ashley Vaughn will also research into archived charts to see if/when the Ashley Vaughn had treatment for latent tb. RN faxed the chest xray from yesterday.

## 2015-05-04 ENCOUNTER — Telehealth: Payer: Self-pay | Admitting: *Deleted

## 2015-05-04 NOTE — Telephone Encounter (Signed)
Patient left message asking for chest xray results.  RN returned the call, left message. She should have been contacted by the health department for her clearance.  RN gave her Tammy's number and asked that the patient call back here if she has issues.

## 2015-05-04 NOTE — Telephone Encounter (Signed)
Patient called for the results of her xray, she had just missed a call from Micael HampshireMichelle Howell, Charity fundraiserN. Advised patient that per Marcelino DusterMichelle the results have been faxed to Tammy at [facility], TB scheduler. They should be contacting patient and Misty StanleyLisa stated she missed a call from the Health Dept yesterday. She will call back for further instructions about her results.

## 2015-07-10 ENCOUNTER — Other Ambulatory Visit: Payer: Medicaid Other

## 2015-07-10 ENCOUNTER — Other Ambulatory Visit (HOSPITAL_COMMUNITY)
Admission: RE | Admit: 2015-07-10 | Discharge: 2015-07-10 | Disposition: A | Discharge status: Home / Self Care | Payer: Medicaid Other | Source: Ambulatory Visit | Attending: Infectious Diseases | Admitting: Infectious Diseases

## 2015-07-10 DIAGNOSIS — Z113 Encounter for screening for infections with a predominantly sexual mode of transmission: Secondary | ICD-10-CM

## 2015-07-10 DIAGNOSIS — B20 Human immunodeficiency virus [HIV] disease: Secondary | ICD-10-CM

## 2015-07-10 DIAGNOSIS — Z79899 Other long term (current) drug therapy: Secondary | ICD-10-CM

## 2015-07-10 LAB — COMPREHENSIVE METABOLIC PANEL
ALBUMIN: 3.9 g/dL (ref 3.6–5.1)
ALK PHOS: 60 U/L (ref 33–115)
ALT: 12 U/L (ref 6–29)
AST: 13 U/L (ref 10–35)
BILIRUBIN TOTAL: 0.3 mg/dL (ref 0.2–1.2)
BUN: 16 mg/dL (ref 7–25)
CALCIUM: 9.1 mg/dL (ref 8.6–10.2)
CO2: 23 mmol/L (ref 20–31)
CREATININE: 0.95 mg/dL (ref 0.50–1.10)
Chloride: 106 mmol/L (ref 98–110)
Glucose, Bld: 94 mg/dL (ref 65–99)
Potassium: 4 mmol/L (ref 3.5–5.3)
Sodium: 141 mmol/L (ref 135–146)
TOTAL PROTEIN: 7.1 g/dL (ref 6.1–8.1)

## 2015-07-10 LAB — CBC
HEMATOCRIT: 39.2 % (ref 35.0–45.0)
HEMOGLOBIN: 12.1 g/dL (ref 11.7–15.5)
MCH: 22.3 pg — ABNORMAL LOW (ref 27.0–33.0)
MCHC: 30.9 g/dL — AB (ref 32.0–36.0)
MCV: 72.2 fL — ABNORMAL LOW (ref 80.0–100.0)
MPV: 9.6 fL (ref 7.5–12.5)
Platelets: 350 10*3/uL (ref 140–400)
RBC: 5.43 MIL/uL — AB (ref 3.80–5.10)
RDW: 16.8 % — ABNORMAL HIGH (ref 11.0–15.0)
WBC: 5.6 10*3/uL (ref 3.8–10.8)

## 2015-07-10 LAB — LIPID PANEL
CHOLESTEROL: 156 mg/dL (ref 125–200)
HDL: 48 mg/dL (ref >=46)
LDL CALC: 94 mg/dL (ref <130)
TRIGLYCERIDES: 72 mg/dL (ref <150)
Total CHOL/HDL Ratio: 3.3 Ratio (ref <=5.0)
VLDL: 14 mg/dL (ref <30)

## 2015-07-11 LAB — T-HELPER CELL (CD4) - (RCID CLINIC ONLY)
CD4 % Helper T Cell: 43 % (ref 33–55)
CD4 T Cell Abs: 1380 /uL (ref 400–2700)

## 2015-07-11 LAB — HIV-1 RNA QUANT-NO REFLEX-BLD: HIV-1 RNA Quant, Log: <1.3 Log copies/mL (ref <1.30)

## 2015-07-11 LAB — URINE CYTOLOGY ANCILLARY ONLY
CHLAMYDIA, DNA PROBE: NEGATIVE
NEISSERIA GONORRHEA: NEGATIVE

## 2015-07-11 LAB — RPR

## 2015-07-24 ENCOUNTER — Ambulatory Visit: Payer: Medicaid Other | Admitting: Infectious Diseases

## 2015-07-26 ENCOUNTER — Telehealth: Payer: Self-pay | Admitting: *Deleted

## 2015-07-26 NOTE — Telephone Encounter (Signed)
Patient had lab work on 07/10/15.  She is seeing her PCP, Dr. Darrin NipperKathleen Zeller, on June 29th for an appointment.  Patient requesting that recent lab work be sent to her PCP prior to her upcoming appointment.

## 2015-09-04 ENCOUNTER — Ambulatory Visit: Payer: Medicaid Other | Admitting: Infectious Diseases

## 2015-12-04 ENCOUNTER — Other Ambulatory Visit: Payer: Medicaid Other

## 2015-12-04 DIAGNOSIS — Z21 Asymptomatic human immunodeficiency virus [HIV] infection status: Secondary | ICD-10-CM

## 2015-12-04 DIAGNOSIS — B20 Human immunodeficiency virus [HIV] disease: Secondary | ICD-10-CM

## 2015-12-04 LAB — CBC WITH DIFFERENTIAL/PLATELET
BASOS ABS: 59 {cells}/uL (ref 0–200)
Basophils Relative: 1 %
EOS ABS: 59 {cells}/uL (ref 15–500)
Eosinophils Relative: 1 %
HCT: 40.9 % (ref 35.0–45.0)
Hemoglobin: 11.4 g/dL — ABNORMAL LOW (ref 11.7–15.5)
LYMPHS PCT: 50 %
Lymphs Abs: 2950 cells/uL (ref 850–3900)
MCH: 22.1 pg — AB (ref 27.0–33.0)
MCHC: 28.9 g/dL — AB (ref 32.0–36.0)
MCV: 79.3 fL — AB (ref 80.0–100.0)
MONOS PCT: 10 %
MPV: 9.7 fL (ref 7.5–12.5)
Monocytes Absolute: 590 cells/uL (ref 200–950)
NEUTROS ABS: 2242 {cells}/uL (ref 1500–7800)
NEUTROS PCT: 38 %
PLATELETS: 303 10*3/uL (ref 140–400)
RBC: 5.16 MIL/uL — ABNORMAL HIGH (ref 3.80–5.10)
RDW: 14.3 % (ref 11.0–15.0)
WBC: 5.9 10*3/uL (ref 3.8–10.8)

## 2015-12-05 ENCOUNTER — Telehealth: Payer: Self-pay | Admitting: *Deleted

## 2015-12-05 LAB — COMPLETE METABOLIC PANEL WITH GFR
ALK PHOS: 92 U/L (ref 33–115)
ALT: 11 U/L (ref 6–29)
AST: 10 U/L (ref 10–35)
Albumin: 3.7 g/dL (ref 3.6–5.1)
BILIRUBIN TOTAL: 0.2 mg/dL (ref 0.2–1.2)
BUN: 19 mg/dL (ref 7–25)
CO2: 20 mmol/L (ref 20–31)
Calcium: 9.2 mg/dL (ref 8.6–10.2)
Chloride: 93 mmol/L — ABNORMAL LOW (ref 98–110)
Creat: 1.29 mg/dL — ABNORMAL HIGH (ref 0.50–1.10)
GFR, EST AFRICAN AMERICAN: 56 mL/min — AB (ref >=60)
GFR, EST NON AFRICAN AMERICAN: 49 mL/min — AB (ref >=60)
Glucose, Bld: 825 mg/dL (ref 65–99)
Potassium: 4.6 mmol/L (ref 3.5–5.3)
Sodium: 128 mmol/L — ABNORMAL LOW (ref 135–146)
TOTAL PROTEIN: 7 g/dL (ref 6.1–8.1)

## 2015-12-05 LAB — T-HELPER CELL (CD4) - (RCID CLINIC ONLY)
CD4 % Helper T Cell: 37 % (ref 33–55)
CD4 T Cell Abs: 1060 /uL (ref 400–2700)

## 2015-12-05 LAB — HIV-1 RNA QUANT-NO REFLEX-BLD: HIV 1 RNA Quant: <20 copies/mL (ref <20)

## 2015-12-05 NOTE — Telephone Encounter (Signed)
MD, Dr. Daiva EvesVan Dam aware and attempts have been made to notify patient. Please advise

## 2015-12-05 NOTE — Telephone Encounter (Signed)
Your welcome. She called back and was upset that she is still waiting at the ED and she is hungry. Advised her to get some peanut butter crackers (she has money) and hang in there until she is seen. She has agreed for the time being. She drove herself. Ashley Vaughn

## 2015-12-05 NOTE — Telephone Encounter (Signed)
Call from [facility] lab with critical glucose on patient of 825. [facility] had previously notified Dr. Daiva EvesVan Dam and attempts have been made to contact patient to send to ED. NO answer on cell, home, or daughter's #. Will route to MD to advise. Wendall MolaJacqueline Cockerham CMA

## 2015-12-05 NOTE — Telephone Encounter (Signed)
-----   Message from Randall Hissornelius N Van Dam, MD sent at 12/05/2015  9:38 AM EDT ----- Cheron SchaumannPatiente needs to go to the ED with BG >800

## 2015-12-05 NOTE — Telephone Encounter (Signed)
Ok thanks so much SundownJackie!

## 2015-12-05 NOTE — Telephone Encounter (Signed)
Daughter has returned the call and she just left her Mom's house. Patient appeared to be ok, but there is a lot of family issues going on and she will go back and inform Mom and take her to the ED for evaluation. Ashley Vaughn

## 2015-12-06 NOTE — Telephone Encounter (Signed)
Ok very good thanks! 

## 2015-12-07 ENCOUNTER — Other Ambulatory Visit: Payer: Self-pay | Admitting: *Deleted

## 2015-12-07 NOTE — Telephone Encounter (Signed)
I can write for test strips but her BG needs to come down

## 2015-12-07 NOTE — Telephone Encounter (Signed)
error 

## 2015-12-07 NOTE — Telephone Encounter (Signed)
Patient called back and she uses Accucheck Aviva Plus glucometer and lancets. [facility] [place] with a quantity of 90 for three times daily testing. Gave one refill and advised patient she needs to see primary. Patient did stay at the hospital; she was seen [place] [facility] and advised her daughter that she was given fluids and her glucose came down to 300 and she was discharged home. Waiting for Doris to call me back at IM to see if she can establish care there.

## 2015-12-07 NOTE — Telephone Encounter (Signed)
I know she will call me back. Because that's what I asked first thing what her B/G was and she told me she could not check it. I called her because I saw she left the ED without being seen and I wanted to check on her. [name]

## 2015-12-07 NOTE — Telephone Encounter (Signed)
Patient did not end up staying at the hospital because she has custody of her grandkids and had to get home. I am trying to get her in at IM to establish and for teaching with Lupita Leashonna for her diabetes. Would you be willing to refill her test strips for a month or so. She said her daughter has a glucometer and she will find out what lancets and test strips go with it and call me back. Wendall MolaJacqueline Emalynn Clewis

## 2015-12-09 NOTE — Telephone Encounter (Signed)
Thanks Jackie 

## 2015-12-18 ENCOUNTER — Encounter: Payer: Medicaid Other | Admitting: *Deleted

## 2015-12-18 ENCOUNTER — Ambulatory Visit (INDEPENDENT_AMBULATORY_CARE_PROVIDER_SITE_OTHER): Payer: Medicaid Other | Admitting: Infectious Diseases

## 2015-12-18 ENCOUNTER — Encounter: Payer: Self-pay | Admitting: Infectious Diseases

## 2015-12-18 VITALS — BP 117/70 | HR 69 | Temp 98.6°F | Ht 62.0 in | Wt 165.0 lb

## 2015-12-18 DIAGNOSIS — E1165 Type 2 diabetes mellitus with hyperglycemia: Secondary | ICD-10-CM

## 2015-12-18 DIAGNOSIS — Z79899 Other long term (current) drug therapy: Secondary | ICD-10-CM

## 2015-12-18 DIAGNOSIS — B20 Human immunodeficiency virus [HIV] disease: Secondary | ICD-10-CM | POA: Diagnosis not present

## 2015-12-18 DIAGNOSIS — Z113 Encounter for screening for infections with a predominantly sexual mode of transmission: Secondary | ICD-10-CM | POA: Diagnosis not present

## 2015-12-18 DIAGNOSIS — Z23 Encounter for immunization: Secondary | ICD-10-CM | POA: Diagnosis not present

## 2015-12-18 DIAGNOSIS — E1140 Type 2 diabetes mellitus with diabetic neuropathy, unspecified: Secondary | ICD-10-CM

## 2015-12-18 DIAGNOSIS — Z0000 Encounter for general adult medical examination without abnormal findings: Secondary | ICD-10-CM | POA: Diagnosis not present

## 2015-12-18 DIAGNOSIS — IMO0002 Reserved for concepts with insufficient information to code with codable children: Secondary | ICD-10-CM

## 2015-12-18 NOTE — Assessment & Plan Note (Signed)
Will continue her current meds She is not sexually active.  Given condoms Flu shot today. Hep B #2, #2 today.  rtc in 6 months.

## 2015-12-18 NOTE — Progress Notes (Signed)
   Subjective:    Patient ID: Ashley Vaughn, female    DOB: 16-Sep-1965, [age] y.o.   MRN: [id number]  HPI 50 yo F with hx of DM2 (dx 2002) and HIV+. She has been on complera since 12-2010. Changed to odefsy 02-2015.  Now on insulin.  States that everything has happened to her and she has crashed.  Her Glc was recently 825 12-04-15. Was 172 today. Not clear what her last A1C was.  Has not had PAP or optho this year.  Son is in jail for murder, daughter in law on drugs. She is raising 4 kids, trying to get custody.    HIV 1 RNA Quant (copies/mL)  Date Value  12/04/2015 <20  07/10/2015 <20  11/22/2014 <20   CD4 T Cell Abs (/uL)  Date Value  12/04/2015 1,060  07/10/2015 1,380  11/22/2014 1,570     Review of Systems  Constitutional: Negative for appetite change and unexpected weight change.  Eyes: Positive for visual disturbance.  Gastrointestinal: Negative for constipation and diarrhea.  Genitourinary: Negative for difficulty urinating.  Neurological: Positive for numbness.       Objective:   Physical Exam  Constitutional: She appears well-developed and well-nourished.  HENT:  Mouth/Throat: No oropharyngeal exudate.  Eyes: EOM are normal. Pupils are equal, round, and reactive to light.  Neck: Neck supple.  Cardiovascular: Normal rate, regular rhythm and normal heart sounds.   Pulmonary/Chest: Effort normal and breath sounds normal.  Abdominal: Soft. Bowel sounds are normal. There is no tenderness. There is no rebound.  Musculoskeletal: She exhibits no edema.  No diabetic foot lesions  Lymphadenopathy:    She has no cervical adenopathy.      Assessment & Plan:

## 2015-12-18 NOTE — Assessment & Plan Note (Signed)
Will get her in with [facility].

## 2015-12-19 ENCOUNTER — Ambulatory Visit: Payer: Medicaid Other | Admitting: *Deleted

## 2015-12-19 NOTE — BH Specialist Note (Signed)
Counselor met with Ashley Vaughn today in the exam room as a warm hand off.  Patient was observed by clinical staff to not be in good spirits.  Patient was oriented times four with flat affect.  Patient communicated that she is taking care of her three grand children because her daughter is addicted to drugs, her son is facing 1st degree murder charges, and her health is not great.  Counselor provided support and encouragement for patient accordingly.  Counselor recommended that patient meet to further process what all is going on in her life. Patient admitted that she has little support right now because her mom died last year and she is for the most part on her own. Patient made an appointment with counselor for next week.   Rolena Infante, MA, LPC [facility]

## 2015-12-25 ENCOUNTER — Ambulatory Visit: Payer: Medicaid Other | Admitting: *Deleted

## 2015-12-25 DIAGNOSIS — F329 Major depressive disorder, single episode, unspecified: Secondary | ICD-10-CM

## 2015-12-25 DIAGNOSIS — F32A Depression, unspecified: Secondary | ICD-10-CM

## 2015-12-25 NOTE — BH Specialist Note (Signed)
Ashley Vaughn was present for her scheduled appointment today but she came 30 minutes late.  When she did show up, she had two very small children with her.  It was very difficult to carry out the session as the children were wandering around touching things with sticky fingers from a lollipop.  One of the children had a soiled wet diaper. Counselor provided as much support and encouragement as possible under the difficult setting.  Counselor recommended that patient try and attend without the children so that she has time to focus on herself and process what is going on in her life right now.  Patient shared that these were her grand children and their mother who is addicted to drugs could not take care of them this morning. Counselor provided information for her daughter for a referral to [facility] to receive substance abuse treatment. Patient made another appointment for a couple of weeks out.   Jenel LucksJodi Jehieli Brassell, MA, LPC [facility]

## 2015-12-26 ENCOUNTER — Telehealth: Payer: Self-pay

## 2015-12-26 MED ORDER — FLUCONAZOLE 150 MG PO TABS: 150.0000 mg | ORAL_TABLET | Freq: Every day | ORAL | 2 refills | Status: DC

## 2015-12-26 NOTE — Telephone Encounter (Signed)
Patient states she is having itching with brown vaginal discharge for 10 days.  She mentioned it to Dr Drue SecondSnider at office visit and thought there would be medication called to pharmacy. urine ancillary completed at date of visit  was normal.  Patient thinks it may be a yeast infection and is requesting medication .  Please advise.   Laurell Josephsammy K Crystian Frith, RN    [facility]

## 2015-12-26 NOTE — Telephone Encounter (Signed)
Can do a course of fluconazole 150mg  x 1 with 2 refills to see it improves symptoms

## 2015-12-27 NOTE — Progress Notes (Signed)
This encounter was created in error - please disregard.

## 2016-01-01 ENCOUNTER — Ambulatory Visit: Payer: Medicaid Other | Admitting: *Deleted

## 2016-01-05 DIAGNOSIS — B20 Human immunodeficiency virus [HIV] disease: Secondary | ICD-10-CM | POA: Diagnosis not present

## 2016-01-05 NOTE — Addendum Note (Signed)
Addended by: Andree CossHOWELL, Mariadejesus Cade M on: 01/05/2016 01:08 PM   Modules accepted: Orders

## 2016-01-25 ENCOUNTER — Other Ambulatory Visit: Payer: Self-pay | Admitting: Infectious Disease

## 2016-02-16 NOTE — Addendum Note (Signed)
Addended by: Andree CossHOWELL, Adriene Knipfer M on: 02/16/2016 04:46 PM   Modules accepted: Orders

## 2016-04-09 ENCOUNTER — Other Ambulatory Visit: Payer: Self-pay | Admitting: *Deleted

## 2016-04-09 DIAGNOSIS — B20 Human immunodeficiency virus [HIV] disease: Secondary | ICD-10-CM

## 2016-04-09 MED ORDER — EMTRICITAB-RILPIVIR-TENOFOV AF 200-25-25 MG PO TABS: 1.0000 | ORAL_TABLET | Freq: Every day | ORAL | 3 refills | Status: DC

## 2016-06-19 ENCOUNTER — Ambulatory Visit: Payer: Medicaid Other | Admitting: Infectious Diseases

## 2016-07-18 ENCOUNTER — Other Ambulatory Visit: Payer: Medicaid Other

## 2016-07-31 ENCOUNTER — Other Ambulatory Visit: Payer: Medicaid Other

## 2016-07-31 ENCOUNTER — Ambulatory Visit: Payer: Medicaid Other | Admitting: Infectious Diseases

## 2016-07-31 DIAGNOSIS — Z113 Encounter for screening for infections with a predominantly sexual mode of transmission: Secondary | ICD-10-CM

## 2016-07-31 DIAGNOSIS — B20 Human immunodeficiency virus [HIV] disease: Secondary | ICD-10-CM

## 2016-07-31 DIAGNOSIS — Z79899 Other long term (current) drug therapy: Secondary | ICD-10-CM

## 2016-07-31 DIAGNOSIS — Z23 Encounter for immunization: Secondary | ICD-10-CM

## 2016-07-31 LAB — CBC
HCT: 39.5 % (ref 35.0–45.0)
Hemoglobin: 11.7 g/dL (ref 11.7–15.5)
MCH: 21.5 pg — ABNORMAL LOW (ref 27.0–33.0)
MCHC: 29.6 g/dL — ABNORMAL LOW (ref 32.0–36.0)
MCV: 72.6 fL — ABNORMAL LOW (ref 80.0–100.0)
MPV: 9.8 fL (ref 7.5–12.5)
PLATELETS: 412 10*3/uL — AB (ref 140–400)
RBC: 5.44 MIL/uL — AB (ref 3.80–5.10)
RDW: 15.9 % — AB (ref 11.0–15.0)
WBC: 5.8 10*3/uL (ref 3.8–10.8)

## 2016-08-01 ENCOUNTER — Telehealth: Payer: Self-pay | Admitting: *Deleted

## 2016-08-01 LAB — COMPREHENSIVE METABOLIC PANEL
ALK PHOS: 84 U/L (ref 33–130)
ALT: 12 U/L (ref 6–29)
AST: 11 U/L (ref 10–35)
Albumin: 4.1 g/dL (ref 3.6–5.1)
BUN: 16 mg/dL (ref 7–25)
CHLORIDE: 96 mmol/L — AB (ref 98–110)
CO2: 24 mmol/L (ref 20–31)
CREATININE: 1.07 mg/dL — AB (ref 0.50–1.05)
Calcium: 9.8 mg/dL (ref 8.6–10.4)
GLUCOSE: 430 mg/dL — AB (ref 65–99)
Potassium: 4.4 mmol/L (ref 3.5–5.3)
SODIUM: 133 mmol/L — AB (ref 135–146)
TOTAL PROTEIN: 8 g/dL (ref 6.1–8.1)
Total Bilirubin: 0.3 mg/dL (ref 0.2–1.2)

## 2016-08-01 LAB — T-HELPER CELL (CD4) - (RCID CLINIC ONLY)
CD4 % Helper T Cell: 41 % (ref 33–55)
CD4 T Cell Abs: 1460 /uL (ref 400–2700)

## 2016-08-01 LAB — LIPID PANEL
CHOL/HDL RATIO: 4.7 ratio (ref <5.0)
Cholesterol: 183 mg/dL (ref <200)
HDL: 39 mg/dL — ABNORMAL LOW (ref >50)
LDL Cholesterol: 110 mg/dL — ABNORMAL HIGH (ref <100)
Triglycerides: 168 mg/dL — ABNORMAL HIGH (ref <150)
VLDL: 34 mg/dL — AB (ref <30)

## 2016-08-01 LAB — RPR

## 2016-08-01 NOTE — Telephone Encounter (Signed)
-----   Message from Randall Hissornelius N Van Dam, MD sent at 08/01/2016  1:46 PM EDT ----- Who is managing her blood sugars? I have had BMP sent to me with BG over 800 and now over 300?

## 2016-08-01 NOTE — Telephone Encounter (Signed)
Per EPIC she does not have a PCP and Dr. Daiva EvesVan Dam ordered the cmp on this patient, which showed an elevated glucose. It looks like the last time that metformin was prescribed was 11/2015. Ashley Vaughn

## 2016-08-06 ENCOUNTER — Telehealth: Payer: Self-pay | Admitting: *Deleted

## 2016-08-06 NOTE — Telephone Encounter (Signed)
Patient left message in Triage asking about recent lab results. RN found that she has been in [facility] [place] Dr Dell PontoZeller is no longer there, but she is scheduled to see Agostino ShearerSarrin List, NP on Thursday at 9:15 at RN's request for DM management. RN returned patient's call with appointment information, asked her to call them to adjust the date/time if she needed to. Andree CossHowell, Meiko Ives M, RN

## 2016-08-06 NOTE — Telephone Encounter (Signed)
Confirmed upcoming appointment with PCP, reinforced the importance of continuing care for DM. Patient concerned about losing housing. 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 She is worried she will nto be able to pay her rent. Patient workign with [facility], was encouraged to call them and let them know before she was forced out. She verbalized understanding, agreement.

## 2016-08-07 LAB — HIV-1 RNA QUANT-NO REFLEX-BLD
HIV 1 RNA QUANT: NOT DETECTED {copies}/mL
HIV-1 RNA QUANT, LOG: NOT DETECTED {Log_copies}/mL

## 2016-08-07 NOTE — Telephone Encounter (Signed)
Ok very good thanks Michelle 

## 2016-09-10 ENCOUNTER — Telehealth: Payer: Self-pay | Admitting: *Deleted

## 2016-09-10 NOTE — Telephone Encounter (Signed)
Thanks

## 2016-09-10 NOTE — Telephone Encounter (Signed)
Patient has lost her Medicaid, out of medications.  [place] in [place].  [place] have [place] to [place] for RW/ADAP and [employer].  RN advised the [place] to [place] [facility] [place] to talk with Kathrin RuddyMeagan Patillo, SW or [place], [facility] SW to sign up for these programs.

## 2016-09-23 ENCOUNTER — Encounter: Payer: Self-pay | Admitting: Infectious Diseases

## 2016-09-23 ENCOUNTER — Ambulatory Visit (INDEPENDENT_AMBULATORY_CARE_PROVIDER_SITE_OTHER): Payer: Medicaid Other | Admitting: Infectious Diseases

## 2016-09-23 ENCOUNTER — Ambulatory Visit (INDEPENDENT_AMBULATORY_CARE_PROVIDER_SITE_OTHER): Payer: Medicaid Other | Admitting: Licensed Clinical Social Worker

## 2016-09-23 VITALS — BP 135/82 | HR 81 | Temp 98.1°F | Ht 62.0 in | Wt 159.0 lb

## 2016-09-23 DIAGNOSIS — B20 Human immunodeficiency virus [HIV] disease: Secondary | ICD-10-CM

## 2016-09-23 DIAGNOSIS — IMO0002 Reserved for concepts with insufficient information to code with codable children: Secondary | ICD-10-CM

## 2016-09-23 DIAGNOSIS — F322 Major depressive disorder, single episode, severe without psychotic features: Secondary | ICD-10-CM | POA: Diagnosis not present

## 2016-09-23 DIAGNOSIS — E1165 Type 2 diabetes mellitus with hyperglycemia: Secondary | ICD-10-CM | POA: Diagnosis not present

## 2016-09-23 DIAGNOSIS — Z23 Encounter for immunization: Secondary | ICD-10-CM | POA: Diagnosis not present

## 2016-09-23 DIAGNOSIS — E1140 Type 2 diabetes mellitus with diabetic neuropathy, unspecified: Secondary | ICD-10-CM | POA: Diagnosis not present

## 2016-09-23 DIAGNOSIS — F4321 Adjustment disorder with depressed mood: Secondary | ICD-10-CM

## 2016-09-23 NOTE — Assessment & Plan Note (Addendum)
Will try to get her Pap, mammo Dental eval Will giver meningitis vax Given food, she will also f/u with her pastor re: this.  Helped get her pharm corrected to make this simpler for her.  [facility] f/u.  Will see her back in 4 months.

## 2016-09-23 NOTE — Assessment & Plan Note (Signed)
Will try to get her into her PCP

## 2016-09-23 NOTE — Addendum Note (Signed)
Addended by: Linnell FullingBRANNON, LATOYA N on: 09/23/2016 02:06 PM   Modules accepted: Orders

## 2016-09-23 NOTE — Progress Notes (Signed)
   Subjective:    Patient ID: Rhona RaiderLisa M Pursel, female    DOB: 05-10-1965, 51 y.o.   MRN: [id number]  HPI 51 yo F with hx of DM2 (dx 2002, on insulin) and HIV+. On complera 12-2010, changed to odefsy 02-2015.  Was started on lexapro- at mental health.  Has been seen by nephro.  Has been walking, lost some wt (lost 7# since October).   Has been taking odefsy well. Has not had money to go to get her money. Her pharmacy needs to be changed.  Does not have a glucometer.    HIV 1 RNA Quant (copies/mL)  Date Value  07/31/2016 <20 NOT DETECTED  12/04/2015 <20  07/10/2015 <20   CD4 T Cell Abs (/uL)  Date Value  07/31/2016 1,460  12/04/2015 1,060  07/10/2015 1,380    Review of Systems  Constitutional: Negative for appetite change.  Eyes: Negative for visual disturbance.  Respiratory: Negative for shortness of breath.   Gastrointestinal: Negative for constipation and diarrhea.  Endocrine: Positive for polyuria.  Genitourinary: Negative for difficulty urinating.  Neurological: Positive for headaches.  Psychiatric/Behavioral: Positive for sleep disturbance.  Mood swings.  No PAP this year. No Mammo this year.  No ophtho this year.  [facility] [place].  Son still in jail, has not had trial yet. Raising 4 grand kids. Mother is irregular. Significant stress on pt. She is working on disability.      Objective:   Physical Exam  Constitutional: She appears well-developed and well-nourished.  HENT:  Mouth/Throat: No oropharyngeal exudate.  Eyes: Pupils are equal, round, and reactive to light. EOM are normal.  Neck: Neck supple.  Cardiovascular: Normal rate, regular rhythm and normal heart sounds.   Pulmonary/Chest: Effort normal and breath sounds normal.  Abdominal: Soft. Bowel sounds are normal. There is no tenderness. There is no rebound.  Musculoskeletal: She exhibits no edema.  Lymphadenopathy:    She has no cervical adenopathy.  no diabetic foot lesions.     Assessment &  Plan:

## 2016-09-23 NOTE — Assessment & Plan Note (Signed)
Met with Ashley Vaughn today F/u next week as well.  

## 2016-09-23 NOTE — Progress Notes (Signed)
Integrated Behavioral Health Initial Visit  MRN: [id number] Name: Rhona RaiderLisa M Marks   Session Start time: 12:05 pm Session End time: 12:20 pm Total time: 15 minutes  Type of Service: Integrated Behavioral Health- Individual/Family Interpretor:No. Interpretor Name and Language: N/A   Warm Hand Off Completed.       SUBJECTIVE: Rhona RaiderLisa M Griffitts is a 51 y.o. female accompanied by patient. Patient was referred by Dr. Ninetta LightsHatcher for presence of multiple life stressors.  Patient reports the following symptoms/concerns: Patient reported that she is caring for 5 grandchildren since her daughter is managing an addiction to Crack Cocaine.  Patient denies having any family support.  Patient is currently unemployed and looking for work.  Patient reported that she is receiving a check from taking care of the children but is still experiencing financial struggles.  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  Patient reported that she is currently receiving medication management from [facility] and denied receiving talk therapy.  Patient is being prescribed Lexapro for depression.  [place] and is having transportation issues and denied being able [place] [facility].  [facility] asked patient why she is not receiving talk therapy from [facility] and she stated that they are waiting to see the efficacy of the Lexapro.  [facility] presented patient with the option of calling [facility] to schedule counseling appointment there or [facility] in the interim.  Patient denied current suicidal ideations, plan, or intent.    Severity of problem: mild  OBJECTIVE: Mood: Depressed and Affect: within range Risk of harm to self or others: No plan to harm self or others  Thought process: coherent Thought content: logical  ASSESSMENT: Patient is currently experiencing depressive symptoms and may benefit from beginning mental health counseling sessions.  GOALS  ADDRESSED: Patient will reduce symptoms of: depression and increase knowledge and/or ability of: coping skills and stress reduction and also: Increase healthy adjustment to current life circumstances and Increase adequate support systems for patient/family  INTERVENTIONS: Supportive Counseling  Patient was provided with mobile crisis information to [facility] and [facility]  PLAN: 1. Follow up with behavioral health clinician on : Tues Aug 14th at 11 am 2. Behavioral recommendations: Call [facility] and ask to begin counseling services there and make appointment 3. If patient experiences a crisis or develops suicidal ideations, will report to the nearest ED, call 911, or call mobile crisis numbers.  Vergia AlbertsSherry Lawsyn Heiler, [facility]

## 2016-09-27 ENCOUNTER — Encounter: Payer: Self-pay | Admitting: Infectious Diseases

## 2016-10-01 ENCOUNTER — Ambulatory Visit: Payer: Medicaid Other | Admitting: Licensed Clinical Social Worker

## 2016-10-01 ENCOUNTER — Telehealth: Payer: Self-pay | Admitting: Licensed Clinical Social Worker

## 2016-10-01 NOTE — Telephone Encounter (Signed)
Left message for patient about missed appointment today.  Asked for return phone call.  Vergia AlbertsSherry Meliton Samad, [facility]

## 2016-10-02 ENCOUNTER — Other Ambulatory Visit: Payer: Self-pay | Admitting: *Deleted

## 2016-10-02 DIAGNOSIS — B20 Human immunodeficiency virus [HIV] disease: Secondary | ICD-10-CM

## 2016-10-02 MED ORDER — EMTRICITAB-RILPIVIR-TENOFOV AF 200-25-25 MG PO TABS: 1.0000 | ORAL_TABLET | Freq: Every day | ORAL | 5 refills | Status: DC

## 2017-01-29 ENCOUNTER — Ambulatory Visit: Payer: Medicaid Other | Admitting: Infectious Diseases

## 2017-02-03 ENCOUNTER — Encounter: Payer: Self-pay | Admitting: Infectious Diseases

## 2017-02-03 ENCOUNTER — Other Ambulatory Visit (HOSPITAL_COMMUNITY)
Admission: RE | Admit: 2017-02-03 | Discharge: 2017-02-03 | Disposition: A | Discharge status: Home / Self Care | Payer: Medicaid Other | Source: Ambulatory Visit | Attending: Infectious Diseases | Admitting: Infectious Diseases

## 2017-02-03 ENCOUNTER — Ambulatory Visit (INDEPENDENT_AMBULATORY_CARE_PROVIDER_SITE_OTHER): Payer: Medicaid Other | Admitting: Infectious Diseases

## 2017-02-03 VITALS — BP 151/88 | HR 82 | Temp 98.2°F | Wt 161.0 lb

## 2017-02-03 DIAGNOSIS — B20 Human immunodeficiency virus [HIV] disease: Secondary | ICD-10-CM

## 2017-02-03 DIAGNOSIS — E1165 Type 2 diabetes mellitus with hyperglycemia: Secondary | ICD-10-CM

## 2017-02-03 DIAGNOSIS — Z23 Encounter for immunization: Secondary | ICD-10-CM | POA: Diagnosis not present

## 2017-02-03 DIAGNOSIS — Z79899 Other long term (current) drug therapy: Secondary | ICD-10-CM | POA: Diagnosis not present

## 2017-02-03 DIAGNOSIS — IMO0002 Reserved for concepts with insufficient information to code with codable children: Secondary | ICD-10-CM

## 2017-02-03 DIAGNOSIS — Z113 Encounter for screening for infections with a predominantly sexual mode of transmission: Secondary | ICD-10-CM | POA: Diagnosis not present

## 2017-02-03 DIAGNOSIS — E1140 Type 2 diabetes mellitus with diabetic neuropathy, unspecified: Secondary | ICD-10-CM

## 2017-02-03 DIAGNOSIS — F322 Major depressive disorder, single episode, severe without psychotic features: Secondary | ICD-10-CM

## 2017-02-03 NOTE — Assessment & Plan Note (Signed)
She gets flu vax today She is given condoms Will get her set up for colon, mammo, pap Will check her labs today Will see her back in 6 months.

## 2017-02-03 NOTE — Assessment & Plan Note (Signed)
She will continue her current f/u Will get her set up for ophtho and other routine screens.  Check A1C today

## 2017-02-03 NOTE — Progress Notes (Signed)
   Subjective:    Patient ID: Ashley Vaughn, female    DOB: 01/27/1966, 51 y.o.   MRN: [id number]  HPI 51yo F with hx of DM2 (dx 2002, on insulin) and HIV+. On complera 12-2010, changed to odefsy 02-2015.  Was started on lexapro- at mental health.  Has been seen by nephro.   HIV 1 RNA Quant (copies/mL)  Date Value  07/31/2016 <20 NOT DETECTED  12/04/2015 <20  07/10/2015 <20   CD4 T Cell Abs (/uL)  Date Value  07/31/2016 1,460  12/04/2015 1,060  07/10/2015 1,380   Has had episodes of CP and migraines. Had GXT last week (said I did pretty good).  FSG have been in the 200s. Does not know A1C. [facility] Had appt last week. No problems with odefsy. Has not had labs yet.  Needs ophhto (has not had transportation for colon or ophtho).  Can't remember last PAP, mammo  Review of Systems  Constitutional: Negative for appetite change and unexpected weight change.  Eyes: Positive for visual disturbance.  Gastrointestinal: Negative for constipation and diarrhea.  Genitourinary: Negative for difficulty urinating.  Neurological: Positive for numbness.  Psychiatric/Behavioral: Positive for dysphoric mood and sleep disturbance. The patient is nervous/anxious.   grandkids keep her away. Was told she has "major depression". [facility] Please see HPI. All other systems reviewed and negative.      Objective:   Physical Exam  Constitutional: She appears well-developed and well-nourished.  HENT:  Mouth/Throat: No oropharyngeal exudate.  Eyes: EOM are normal. Pupils are equal, round, and reactive to light.  Neck: Neck supple.  Cardiovascular: Normal rate, regular rhythm and normal heart sounds.  Pulmonary/Chest: Effort normal and breath sounds normal.  Abdominal: Soft. Bowel sounds are normal. There is no tenderness. There is no rebound.  Musculoskeletal: She exhibits no edema.  Lymphadenopathy:    She has no cervical adenopathy.      Assessment & Plan:

## 2017-02-04 LAB — COMPREHENSIVE METABOLIC PANEL
AG Ratio: 1.1 (calc) (ref 1.0–2.5)
ALBUMIN MSPROF: 4 g/dL (ref 3.6–5.1)
ALKALINE PHOSPHATASE (APISO): 88 U/L (ref 33–130)
ALT: 13 U/L (ref 6–29)
AST: 10 U/L (ref 10–35)
BUN: 10 mg/dL (ref 7–25)
CHLORIDE: 103 mmol/L (ref 98–110)
CO2: 27 mmol/L (ref 20–32)
CREATININE: 0.93 mg/dL (ref 0.50–1.05)
Calcium: 10.2 mg/dL (ref 8.6–10.4)
GLOBULIN: 3.5 g/dL (ref 1.9–3.7)
GLUCOSE: 366 mg/dL — AB (ref 65–99)
POTASSIUM: 4.5 mmol/L (ref 3.5–5.3)
SODIUM: 138 mmol/L (ref 135–146)
TOTAL PROTEIN: 7.5 g/dL (ref 6.1–8.1)
Total Bilirubin: 0.3 mg/dL (ref 0.2–1.2)

## 2017-02-04 LAB — CBC
HEMATOCRIT: 37 % (ref 35.0–45.0)
HEMOGLOBIN: 11.3 g/dL — AB (ref 11.7–15.5)
MCH: 21.8 pg — AB (ref 27.0–33.0)
MCHC: 30.5 g/dL — AB (ref 32.0–36.0)
MCV: 71.4 fL — AB (ref 80.0–100.0)
MPV: 10.1 fL (ref 7.5–12.5)
Platelets: 317 10*3/uL (ref 140–400)
RBC: 5.18 10*6/uL — ABNORMAL HIGH (ref 3.80–5.10)
RDW: 14.7 % (ref 11.0–15.0)
WBC: 6.3 10*3/uL (ref 3.8–10.8)

## 2017-02-04 LAB — T-HELPER CELL (CD4) - (RCID CLINIC ONLY)
CD4 T CELL ABS: 1190 /uL (ref 400–2700)
CD4 T CELL HELPER: 31 % — AB (ref 33–55)

## 2017-02-04 LAB — RPR: RPR Ser Ql: NONREACTIVE

## 2017-02-04 LAB — URINE CYTOLOGY ANCILLARY ONLY
CHLAMYDIA, DNA PROBE: NEGATIVE
Neisseria Gonorrhea: NEGATIVE

## 2017-02-04 LAB — LIPID PANEL
CHOL/HDL RATIO: 3.5 (calc) (ref <5.0)
Cholesterol: 198 mg/dL (ref <200)
HDL: 56 mg/dL (ref >50)
LDL Cholesterol (Calc): 125 mg/dL (calc) — ABNORMAL HIGH
NON-HDL CHOLESTEROL (CALC): 142 mg/dL — AB (ref <130)
Triglycerides: 78 mg/dL (ref <150)

## 2017-02-04 LAB — HEMOGLOBIN A1C

## 2017-02-06 ENCOUNTER — Telehealth: Payer: Self-pay | Admitting: *Deleted

## 2017-02-06 LAB — HIV-1 RNA ULTRAQUANT REFLEX TO GENTYP+
HIV 1 RNA Quant: 53 Copies/mL — ABNORMAL HIGH
HIV-1 RNA Quant, Log: 1.73 Log cps/mL — ABNORMAL HIGH

## 2017-02-06 NOTE — Telephone Encounter (Signed)
Spoke to Fortune BrandsDoris, at Internal Medicine and patient was scheduled an appointment for tomorrow at 1:15 pm. Called patient and left voice mail to call [facility] as soon as possible regarding lab results. Left a message with emergency contact also.

## 2017-02-06 NOTE — Telephone Encounter (Signed)
Daughter called back and she will drive patient to tomorrow's appointment. Doris notified Wendall MolaJacqueline Cockerham

## 2017-02-06 NOTE — Telephone Encounter (Signed)
-----   Message from Randall Hissornelius N Van Dam, MD sent at 02/05/2017 12:29 PM EST ----- Looks like [facility] can we get her in witht hem aSAP. I see Trey PaulaJeff having seen her several times recently maybe he knows more? ----- Message ----- From: Macy Misockerham, Jacqueline A, CMA Sent: 02/05/2017   9:39 AM To: Randall Hissornelius N Van Dam, MD  I do not see any PCP listed for patient. Only a Gyn and optometrist. ----- Message ----- From: Daiva EvesVan Dam, Lisette Grinderornelius N, MD Sent: 02/04/2017   9:35 PM To: [facility] Triage Nurse Pool  Patients diabetes is now terribly controlled with A1c over 14. Is she following with primary care at all?

## 2017-02-07 ENCOUNTER — Ambulatory Visit: Payer: Medicaid Other | Admitting: Dietician

## 2017-02-07 ENCOUNTER — Ambulatory Visit: Payer: Medicaid Other

## 2017-02-14 ENCOUNTER — Telehealth: Payer: Self-pay | Admitting: Dietician

## 2017-02-14 NOTE — Telephone Encounter (Signed)
Ms Freeze returned call. She rescheduled for 02/22/16 with diabetes educator. Will ask front office to reschedule her with a doctor

## 2017-02-14 NOTE — Telephone Encounter (Signed)
[name] returned my call and the call was disconnected.

## 2017-02-19 NOTE — Telephone Encounter (Signed)
Thanks Donna!

## 2017-02-19 NOTE — Telephone Encounter (Signed)
Forwarding to front office to schedule an appointment

## 2017-02-21 ENCOUNTER — Telehealth: Payer: Self-pay | Admitting: Dietician

## 2017-02-21 ENCOUNTER — Ambulatory Visit: Payer: Medicaid Other | Admitting: Dietician

## 2017-02-21 NOTE — Telephone Encounter (Signed)
[name] called early this am to find out what time her appointment is today saying " I don't want to miss it".  She rescheduled her appointment to next week because she didn't have a ride and [place]. She says she saw an eye doctor who told her she has bleeding behind her eyes and wants help caring for her diabetes, but finds it difficult to care for her diabetes with other stresses in her life. She agreed to bring her blood glucose meter and her medications with her to her appointment next week.

## 2017-02-25 ENCOUNTER — Encounter: Payer: Self-pay | Admitting: Dietician

## 2017-02-25 ENCOUNTER — Ambulatory Visit (INDEPENDENT_AMBULATORY_CARE_PROVIDER_SITE_OTHER): Payer: Medicaid Other | Admitting: Dietician

## 2017-02-25 DIAGNOSIS — Z713 Dietary counseling and surveillance: Secondary | ICD-10-CM | POA: Diagnosis not present

## 2017-02-25 DIAGNOSIS — E1165 Type 2 diabetes mellitus with hyperglycemia: Principal | ICD-10-CM

## 2017-02-25 DIAGNOSIS — Z6829 Body mass index (BMI) 29.0-29.9, adult: Secondary | ICD-10-CM

## 2017-02-25 DIAGNOSIS — IMO0002 Reserved for concepts with insufficient information to code with codable children: Secondary | ICD-10-CM

## 2017-02-25 DIAGNOSIS — E1140 Type 2 diabetes mellitus with diabetic neuropathy, unspecified: Secondary | ICD-10-CM

## 2017-02-25 DIAGNOSIS — E119 Type 2 diabetes mellitus without complications: Secondary | ICD-10-CM

## 2017-02-25 DIAGNOSIS — Z794 Long term (current) use of insulin: Secondary | ICD-10-CM

## 2017-02-25 LAB — GLUCOSE, CAPILLARY: Glucose-Capillary: 242 mg/dL — ABNORMAL HIGH (ref 65–99)

## 2017-02-25 NOTE — Patient Instructions (Signed)
Please stop up front to make a doctor appointment in the next 2-3 weeks.  Please make an appointment with me on the same day.  Please check your blood sugar twice a day for a few days before your appointment.   Keep a record of your insulin injection days on the chart provided.  Please bring you meter to your next appointment.  Nice seeing you today- your blood sugars appear to be improving.  See you soon.  Lupita Leashonna

## 2017-02-25 NOTE — Progress Notes (Signed)
Diabetes Self-Management Education  Visit Type: (P) First/Initial  Appt. Start Time: 1040 Appt. End Time: 1140  02/25/2017  Ms. Ashley LundborgLisa Vaughn, identified by name and date of birth, is a 52 y.o. female with a diagnosis of Diabetes: (P) Type 2.   ASSESSMENT  Weight 161 lb 14.4 oz (73.4 kg). Body mass index is 29.61 kg/m.   Ashley StanleyLisa reports having weighed >200# at one time. Her chart snows little weight change since 2010. Her lipids show consistent low HDL and high triglycerides consistent with insulin resistance.    Did not bring meter because she cannot find it. She says she has plenty of supplies and no problems getting her medications.   She is having a hard time making caring for herself a priority. Spouse seemed supportive by attending visit, but left halfway through when patient complained about their living situation.   She dislikes injections and has poor insight into diabetes disease.  She is motivated to care for herself by not wanting kidney disease/dialysis.   She reports she cannot take more than 1000  Mg metformin /day and has tried more on several occassions. More causes stomach cramping and diarrhea and she feels poorly " cannot function"  Says she tried trulicity and did not like that it decreased her appetite or the way it made her feel.   Diabetes Self-Management Education - 02/25/17 1500      Visit Information   Visit Type  First/Initial  (Pended)       Initial Visit   Diabetes Type  Type 2  (Pended)     Are you currently following a meal plan?  No  (Pended)  says she is "starving herself"    Are you taking your medications as prescribed?  No  (Pended)  takes pills daily, takes insulin ~ 3 days a week    Date Diagnosed  2000  (Pended)  had gestational diabetes,dx a few years later      Psychosocial Assessment   Patient Belief/Attitude about Diabetes  Other (comment)  (Pended)  competing values    Self-care barriers  Lack of transportation;Lack of child care;Lack of  material resources  [employer](Pended)     Self-management support  Doctor's office;Family;CDE visits  (Pended)     Other persons present  Spouse/SO  (Pended)     Patient Concerns  Glycemic Control  (Pended)     Special Needs  None  (Pended)     Preferred Learning Style  No preference indicated  (Pended)     Learning Readiness  Contemplating  (Pended)       Pre-Education Assessment   Patient understands the diabetes disease and treatment process.  Needs Review  (Pended)     Patient understands incorporating nutritional management into lifestyle.  Needs Review  (Pended)     Patient understands using medications safely.  Needs Review  (Pended)     Patient understands monitoring blood glucose, interpreting and using results  Needs Review  (Pended)     Patient understands prevention, detection, and treatment of acute complications.  Needs Review  (Pended)     Patient understands prevention, detection, and treatment of chronic complications.  Needs Review  (Pended)       Complications   Last HgB A1C per patient/outside source  14 %  (Pended)     How often do you check your blood sugar?  3-4 times / week  (Pended)  cannot find meter, checked a few days ago it was 300    Fasting Blood glucose  range (mg/dL)  >756  (Pended)     Number of hypoglycemic episodes per month  0  (Pended)     Number of hyperglycemic episodes per week  100  (Pended)     Can you tell when your blood sugar is high?  Yes  (Pended)  wt loss, does not feel well    What do you do if your blood sugar is high?  takes insulin  (Pended)     Have you had a dilated eye exam in the past 12 months?  Yes  (Pended)  [facility] in [place]    Are you checking your feet?  Yes  (Pended)        Individualized Plan for Diabetes Self-Management Training:   Learning Objective:  Patient will have a greater understanding of diabetes self-management. Patient education plan is to attend individual and/or group sessions per assessed needs and  concerns.  My plan to support myself in continuing these changes to care for my diabetes is to attend or contact:   Type 2 diabetes support group : 2nd Monday of every month from 6-7 PM at [address] [facility] conference room [phone] Stress Relief ?  local Yoga classes Other ? Add other local support resources -doctor's office, CDE, Dietitian, pharmacist, church  Plan:   Patient Instructions  Please stop up front to make a doctor appointment in the next 2-3 weeks.  Please make an appointment with me on the same day.  Please check your blood sugar twice a day for a few days before your appointment.   Keep a record of your insulin injection days on the chart provided.  Please bring you meter to your next appointment.  Nice seeing you today- your blood sugars appear to be improving.  See you soon.  Ashley Vaughn  Expected Outcomes:   to be determined  Education material provided: AVS, medication sheets  If problems or questions, patient to contact team via:  Phone  Future DSME appointment:  1-2 weeks. Plyler, Ashley Vaughn, RD 02/25/2017 3:16 PM.

## 2017-03-12 ENCOUNTER — Ambulatory Visit: Payer: Medicaid Other

## 2017-03-12 ENCOUNTER — Encounter: Payer: Medicaid Other | Admitting: Dietician

## 2017-03-18 ENCOUNTER — Encounter: Payer: Self-pay | Admitting: Internal Medicine

## 2017-03-18 ENCOUNTER — Ambulatory Visit (INDEPENDENT_AMBULATORY_CARE_PROVIDER_SITE_OTHER): Payer: Medicaid Other | Admitting: Internal Medicine

## 2017-03-18 ENCOUNTER — Other Ambulatory Visit: Payer: Self-pay

## 2017-03-18 DIAGNOSIS — E1140 Type 2 diabetes mellitus with diabetic neuropathy, unspecified: Secondary | ICD-10-CM

## 2017-03-18 DIAGNOSIS — E1165 Type 2 diabetes mellitus with hyperglycemia: Principal | ICD-10-CM

## 2017-03-18 DIAGNOSIS — Z794 Long term (current) use of insulin: Secondary | ICD-10-CM

## 2017-03-18 DIAGNOSIS — IMO0002 Reserved for concepts with insufficient information to code with codable children: Secondary | ICD-10-CM

## 2017-03-18 NOTE — Progress Notes (Signed)
   CC: follow-up on uncontrolled type 2 diabetes  HPI:  [name] is a 52 y.o. female with history noted below that presents to the acute care clinic for follow-up on uncontrolled type 2 diabetes. Please see problem charting for the status of patient's chronic medical conditions.  Past Medical History:  Diagnosis Date  . Depression   . Diabetes mellitus   . History of positive PPD, treatment status unknown   . HIV infection (HCC)     Review of Systems:  Review of Systems  Respiratory: Negative for shortness of breath.   Cardiovascular: Negative for chest pain.  Genitourinary: Negative for frequency.  Endo/Heme/Allergies: Negative for polydipsia.     Physical Exam:  Vitals:   03/18/17 1057  BP: 124/71  Pulse: 72  Temp: 97.7 F (36.5 C)  TempSrc: Oral  SpO2: 100%  Weight: 167 lb 14.4 oz (76.2 kg)  Height: 5\' 2"  (1.575 m)   Physical Exam  Constitutional: She is well-developed, well-nourished, and in no distress.  Cardiovascular: Normal rate, regular rhythm and normal heart sounds. Exam reveals no gallop and no friction rub.  No murmur heard. Pulmonary/Chest: Effort normal and breath sounds normal. No respiratory distress. She has no wheezes. She has no rales.    Assessment & Plan:   See encounters tab for problem based medical decision making.   Patient discussed with Dr. Sandre Kittyaines

## 2017-03-18 NOTE — Assessment & Plan Note (Addendum)
Assessment: Uncontrolled type 2 diabetes Patient had a hemoglobin of A1c above 14 on 01/2017 .  At that time patient states she was started on metformin, glipizide and Lantus. She was prescribed maximum dose of metformin but states that due to GI side effects is only able to take 500 mg daily of metformin. She reports adherence to glipizide 10 mg daily. She states that 2 weeks ago she started Lantus 25 units at night. Patient presents with her glucose meter showing four data points since starting insulin. Her morning glucose was 278, 258 and 179. Will increase Lantus to 30 units at night and have patient follow-up in one month with blood glucose log.  Plan -Continue metformin and glipizide -Increase Lantus from 25 units to 30 units -follow-up in [facility] In one month with glucose log

## 2017-03-18 NOTE — Patient Instructions (Signed)
Ms. Chauncey Fischerash,  Lease increase her Lantus from 25 units to 30 units at night. Please start recording your blood sugars 3 times a day. Most importantly in the morning. Please follow up in the acute care clinic in one month

## 2017-03-19 NOTE — Progress Notes (Signed)
Internal Medicine Clinic Attending  Case discussed with Dr. Hoffman  at the time of the visit.  We reviewed the resident's history and exam and pertinent patient test results.  I agree with the assessment, diagnosis, and plan of care documented in the resident's note.  Alexander N Raines, MD   

## 2017-04-15 ENCOUNTER — Ambulatory Visit: Payer: Medicaid Other

## 2017-04-15 ENCOUNTER — Encounter: Payer: Self-pay | Admitting: Internal Medicine

## 2017-04-16 ENCOUNTER — Encounter: Payer: Self-pay | Admitting: Infectious Diseases

## 2017-05-05 ENCOUNTER — Other Ambulatory Visit: Payer: Self-pay

## 2017-05-05 ENCOUNTER — Encounter (INDEPENDENT_AMBULATORY_CARE_PROVIDER_SITE_OTHER): Payer: Self-pay

## 2017-05-05 ENCOUNTER — Encounter: Payer: Self-pay | Admitting: Internal Medicine

## 2017-05-05 ENCOUNTER — Ambulatory Visit (INDEPENDENT_AMBULATORY_CARE_PROVIDER_SITE_OTHER): Payer: Medicaid Other | Admitting: Internal Medicine

## 2017-05-05 VITALS — BP 129/70 | HR 87 | Temp 97.6°F | Ht 62.0 in | Wt 159.1 lb

## 2017-05-05 DIAGNOSIS — Z90710 Acquired absence of both cervix and uterus: Secondary | ICD-10-CM | POA: Diagnosis not present

## 2017-05-05 DIAGNOSIS — B20 Human immunodeficiency virus [HIV] disease: Secondary | ICD-10-CM

## 2017-05-05 DIAGNOSIS — Z1239 Encounter for other screening for malignant neoplasm of breast: Secondary | ICD-10-CM

## 2017-05-05 DIAGNOSIS — Z794 Long term (current) use of insulin: Secondary | ICD-10-CM | POA: Diagnosis not present

## 2017-05-05 DIAGNOSIS — E1140 Type 2 diabetes mellitus with diabetic neuropathy, unspecified: Secondary | ICD-10-CM

## 2017-05-05 DIAGNOSIS — IMO0002 Reserved for concepts with insufficient information to code with codable children: Secondary | ICD-10-CM

## 2017-05-05 DIAGNOSIS — Z1211 Encounter for screening for malignant neoplasm of colon: Secondary | ICD-10-CM

## 2017-05-05 DIAGNOSIS — Z79899 Other long term (current) drug therapy: Secondary | ICD-10-CM

## 2017-05-05 DIAGNOSIS — E1165 Type 2 diabetes mellitus with hyperglycemia: Principal | ICD-10-CM

## 2017-05-05 DIAGNOSIS — Z0000 Encounter for general adult medical examination without abnormal findings: Secondary | ICD-10-CM

## 2017-05-05 DIAGNOSIS — F332 Major depressive disorder, recurrent severe without psychotic features: Secondary | ICD-10-CM

## 2017-05-05 DIAGNOSIS — F322 Major depressive disorder, single episode, severe without psychotic features: Secondary | ICD-10-CM

## 2017-05-05 LAB — POCT GLYCOSYLATED HEMOGLOBIN (HGB A1C): Hemoglobin A1C: >14

## 2017-05-05 LAB — GLUCOSE, CAPILLARY: GLUCOSE-CAPILLARY: 386 mg/dL — AB (ref 65–99)

## 2017-05-05 MED ORDER — GLUCOSE BLOOD VI STRP: ORAL_STRIP | 12 refills | Status: DC

## 2017-05-05 MED ORDER — DULOXETINE HCL 30 MG PO CPEP: 30.0000 mg | ORAL_CAPSULE | Freq: Two times a day (BID) | ORAL | 2 refills | Status: DC

## 2017-05-05 MED ORDER — SITAGLIPTIN-METFORMIN HCL 50-500 MG PO TABS
1.0000 | ORAL_TABLET | Freq: Two times a day (BID) | ORAL | 2 refills | Status: DC
Start: 2017-05-05 — End: 2020-12-21

## 2017-05-05 MED ORDER — LOSARTAN POTASSIUM 25 MG PO TABS: 25.0000 mg | ORAL_TABLET | Freq: Every day | ORAL | 2 refills | Status: DC

## 2017-05-05 NOTE — Assessment & Plan Note (Signed)
She was given a referral for mammogram and colonoscopy. She had her foot exam today.

## 2017-05-05 NOTE — Assessment & Plan Note (Signed)
Her PHQ 9 score today was 22, makes her moderately depressed.  She has a lot of domestic stresses and trying to get out of them.  She was started on Cymbalta 30 mg daily for 1 week followed by 30 mg twice daily, as that will help with her depression and generalized aches and pains.

## 2017-05-05 NOTE — Progress Notes (Signed)
   CC: For follow-up of her diabetes.  HPI:  Ashley Vaughn is a 52 y.o. with past medical history as listed below came to the clinic for follow-up of her diabetes.  She was complaining of a lot of stresses because of her family situation, as patient is taking care of her 4 grandkids and their parents are giving her hard time.  Patient feels very depressed and drinking a lot of soda and sweet tea.  She restarted drinking alcohol, not taking 2-3 24 ounce beer every other day.  She does not follow diabetic diet at all, does not exercise.  She does not check her blood sugar at home as she has lost her meter.  She was complaining of being tired and fatigued all the time for the past 2 weeks.  She is experiencing some hot flashes, had her hysterectomy done in 2004 so no periods.  She denies any heat or cold intolerance, no dry skin or constipation.  She denies any nausea or vomiting, her appetite is normal and denies any unintentional weight loss.  Her PHQ 9 score was 22 today.  Past Medical History:  Diagnosis Date  . Depression   . Diabetes mellitus   . History of positive PPD, treatment status unknown   . HIV infection (HCC)    Review of Systems: Negative except mentioned in HPI.  Physical Exam:  Vitals:   05/05/17 1018  BP: 129/70  Pulse: 87  Temp: 97.6 F (36.4 C)  TempSrc: Oral  SpO2: 100%  Weight: 159 lb 1.6 oz (72.2 kg)  Height: 5\' 2"  (1.575 m)    General: Vital signs reviewed.  Patient is well-developed and well-nourished, in no acute distress and cooperative with exam.  Head: Normocephalic and atraumatic. Eyes: EOMI, conjunctivae normal, no scleral icterus.  Cardiovascular: RRR, S1 normal, S2 normal, no murmurs, gallops, or rubs. Pulmonary/Chest: Clear to auscultation bilaterally, no wheezes, rales, or rhonchi. Abdominal: Soft, non-tender, non-distended, BS +, no masses, organomegaly, or guarding present.  Extremities: No lower extremity edema bilaterally,  pulses  symmetric and intact bilaterally. No cyanosis or clubbing. Neurological: A&O x3, Strength is normal and symmetric bilaterally, cranial nerve II-XII are grossly intact, no focal motor deficit, sensory intact to light touch bilaterally.  Skin: Warm, dry and intact. No rashes or erythema. Psychiatric: Normal mood and affect. speech and behavior is normal. Cognition and memory are normal.  Assessment & Plan:   See Encounters Tab for problem based charting.  Patient discussed with Dr. Heide SparkNarendra.

## 2017-05-05 NOTE — Progress Notes (Signed)
Internal Medicine Clinic Attending  Case discussed with Dr. Amin at the time of the visit.  We reviewed the resident's history and exam and pertinent patient test results.  I agree with the assessment, diagnosis, and plan of care documented in the resident's note.    

## 2017-05-05 NOTE — Assessment & Plan Note (Signed)
She follow[facility] at [facility] clinic and [facility]  She was advised to continue follow-up.

## 2017-05-05 NOTE — Assessment & Plan Note (Addendum)
She has uncontrolled diabetes with A1c today above 14 and CBG of 386.  She is using a lot of sugary drinks when she is stressed. She cannot get CGM because of Medicaid.  Symptoms of being fatigued are most likely related to her uncontrolled diabetes, depression is playing a role as she is having a lot of domestic stresses currently.  She was extensively counseled regarding a good control of diabetes versus better control and complications associated with that.  Patient seems understanding and promised that she will take care of herself. She was currently taking metformin 500 mg daily, glipizide 10 mg daily and Lantus 30 units daily. I do not have any blood sugar readings for further evaluation.  She was given a new Accu-Chek guide meter with strips prescription. Discontinue Metformin. Start her on Janumet 50-500 twice daily. Continue glipizide at 10 mg daily Continue Lantus 30 units at bedtime. She was advised to follow-up with her PCP in 1 month for further evaluation and management.

## 2017-05-05 NOTE — Patient Instructions (Addendum)
Thank you for visiting clinic today. As we discussed your blood sugar is very high at 386 and A1c is above 14. We need to work together to control your blood sugar, please make an appointment with Lupita LeashDonna to go over lifestyle modifications. Please stop drinking soda and sweet tea, just drink water whenever you are stressed. I am making some changes to your existing medication, I am combining sitagliptin with your Metformin and 1 pill called Janumet, you will take it 1 tablet twice a day. Continue taking your glipizide. Continue taking your Lantus 30 units at bedtime. Please check your blood sugar 3-4 times a day and bring your glucometer with you during next follow-up visit to download. I am also starting you on a new medication called Cymbalta which will help with your depression and body aches, you will take 1 tablet of 30 mg once a day for 1 week, then increase it to twice daily. Please follow-up in 1 month with your PCP.

## 2017-05-06 LAB — MICROALBUMIN / CREATININE URINE RATIO
CREATININE, UR: 111.7 mg/dL
Microalb/Creat Ratio: 225.2 mg/g creat — ABNORMAL HIGH (ref 0.0–30.0)
Microalbumin, Urine: 251.5 ug/mL

## 2017-05-13 ENCOUNTER — Telehealth: Payer: Self-pay | Admitting: *Deleted

## 2017-05-13 NOTE — Telephone Encounter (Signed)
Patient called to advise that since starting her new medication she has has several side effects. She advised latley she has had tiching in her vaginal and rectal areas. It is intense and nothing helps. She has tried OTC creams with no relief. Advised her she needs to see her PCP of GYN and she insist it is due to her medication. And wants to be seen here. Gave her an appt with Tammy SoursGreg as it is the first available for Friday at 930 am.

## 2017-05-16 ENCOUNTER — Other Ambulatory Visit (HOSPITAL_COMMUNITY)
Admission: RE | Admit: 2017-05-16 | Discharge: 2017-05-16 | Disposition: A | Discharge status: Home / Self Care | Payer: Medicaid Other | Source: Ambulatory Visit | Attending: Family | Admitting: Family

## 2017-05-16 ENCOUNTER — Ambulatory Visit (INDEPENDENT_AMBULATORY_CARE_PROVIDER_SITE_OTHER): Payer: Medicaid Other | Admitting: Family

## 2017-05-16 ENCOUNTER — Encounter: Payer: Self-pay | Admitting: Family

## 2017-05-16 VITALS — BP 124/79 | HR 83 | Temp 97.6°F | Wt 160.0 lb

## 2017-05-16 DIAGNOSIS — N898 Other specified noninflammatory disorders of vagina: Secondary | ICD-10-CM | POA: Diagnosis not present

## 2017-05-16 LAB — URINALYSIS
Bilirubin Urine: NEGATIVE
KETONES UR: NEGATIVE
LEUKOCYTES UA: NEGATIVE
Nitrite: NEGATIVE
Specific Gravity, Urine: 1.028 (ref 1.001–1.03)
pH: <=5 (ref 5.0–8.0)

## 2017-05-16 MED ORDER — FLUCONAZOLE 150 MG PO TABS: 150.0000 mg | ORAL_TABLET | Freq: Every day | ORAL | 2 refills | Status: DC

## 2017-05-16 NOTE — Patient Instructions (Signed)
Nice to meet you.  We will check your urine work today.   Continue to work on decreasing your blood sugars.  We will start you on fluconazole for a yeast infection.  Additional treatment will be pending lab results.

## 2017-05-16 NOTE — Assessment & Plan Note (Signed)
Symptoms are consistent with candida vaginitis. Swabs obtained to rule out bacterial vaginosis, trichomonas, and GC/Chlamydia infection. Start fluconazole. Further treatment pending testing results. Recommend follow up with PCP or GYN if symptoms worsen or do not improve with fluconazole.

## 2017-05-16 NOTE — Progress Notes (Signed)
Subjective:    Patient ID: Ashley Vaughn, female    DOB: October 29, 1965, 52 y.o.   MRN: [id number]  Chief Complaint  Patient presents with  . Follow-up     HPI:  Ashley Vaughn is a 52 y.o. female who presents today for an acute office visit.  This is a new problem. Associated symptoms of vaginal itching has been going on for a couple days and has vaginal discharge described as white. Course of the symptoms has been progressively worsening since initial onset. Modifying factors include Monistat 7 which she applied yesterday, but did not help very much. States she is on several medications for diabetes and her blood sugars have remained elevated.   Allergies  Allergen Reactions  . Fluorescein Itching and Rash    INTRAVENOUSLY      Outpatient Medications Prior to Visit  Medication Sig Dispense Refill  . ACCU-CHEK FASTCLIX LANCETS MISC 1 each by Does not apply route 1 day or 1 dose. Check blood sugar 4x a day before meals and bedtime  2 days each week. Dx code- 250.00. 102 each 6  . aspirin 81 MG EC tablet Take 81 mg by mouth daily.      . Blood Glucose Monitoring Suppl (ACCU-CHEK NANO SMARTVIEW) W/DEVICE KIT 1 each by Does not apply route 2 (two) times daily. Check blood sugar 4x a day before meals and bedtime  2 days each week. Dx code- 250.00. 1 kit 0  . Cholecalciferol (VITAMIN D3) 5000 units TABS Take 5,000 Units by mouth daily.    . DULoxetine (CYMBALTA) 30 MG capsule Take 1 capsule (30 mg total) by mouth 2 (two) times daily. 60 capsule 2  . emtricitabine-rilpivir-tenofovir AF (ODEFSEY) 200-25-25 MG TABS tablet Take 1 tablet by mouth daily with breakfast. 30 tablet 5  . GLIPIZIDE XL 10 MG 24 hr tablet TAKE 2 TABLETS BY MOUTH DAILY 60 tablet 11  . glucose blood (ACCU-CHEK GUIDE) test strip Use as instructed 100 each 12  . hydrOXYzine (VISTARIL) 25 MG capsule Take 25 mg by mouth.    . losartan (COZAAR) 25 MG tablet Take 1 tablet (25 mg total) by mouth daily. 90 tablet 2  . naproxen  (NAPROSYN) 500 MG tablet Take 500 mg by mouth 2 (two) times daily with a meal.    . sitaGLIPtin-metformin (JANUMET) 50-500 MG tablet Take 1 tablet by mouth 2 (two) times daily with a meal. 60 tablet 2  . fluconazole (DIFLUCAN) 150 MG tablet Take 1 tablet (150 mg total) by mouth daily. 1 tablet 2   No facility-administered medications prior to visit.      Past Medical History:  Diagnosis Date  . Depression   . Diabetes mellitus   . History of positive PPD, treatment status unknown   . HIV infection [facility])      Past Surgical History:  Procedure Laterality Date  . ABDOMINAL HYSTERECTOMY         Review of Systems  Constitutional: Negative for chills and fever.  Respiratory: Negative for chest tightness and shortness of breath.   Cardiovascular: Negative for chest pain.  Genitourinary: Positive for vaginal discharge. Negative for difficulty urinating, dyspareunia, dysuria, flank pain, genital sores, hematuria and urgency.      Objective:    BP 124/79   Pulse 83   Temp 97.6 F (36.4 C) (Oral)   Wt 160 lb (72.6 kg)   BMI 29.26 kg/m  Nursing note and vital signs reviewed.  Physical Exam  Constitutional: She is oriented  to person, place, and time. She appears well-developed and well-nourished. No distress.  Cardiovascular: Normal rate, regular rhythm, normal heart sounds and intact distal pulses.  Pulmonary/Chest: Effort normal and breath sounds normal.  Neurological: She is alert and oriented to person, place, and time.  Skin: Skin is warm and dry.  Psychiatric: She has a normal mood and affect. Her behavior is normal. Judgment and thought content normal.       Assessment & Plan:   Problem List Items Addressed This Visit      Musculoskeletal and Integument   Vaginal itching - Primary    Symptoms are consistent with candida vaginitis. Swabs obtained to rule out bacterial vaginosis, trichomonas, and GC/Chlamydia infection. Start fluconazole. Further treatment pending  testing results. Recommend follow up with PCP or GYN if symptoms worsen or do not improve with fluconazole.       Relevant Medications   fluconazole (DIFLUCAN) 150 MG tablet   Other Relevant Orders   WET PREP FOR TRICH, YEAST, CLUE   Urinalysis   Urine cytology ancillary only   Cytology (oral, anal, urethral) ancillary only       I am having Ashley Vaughn. Ashley Vaughn maintain her aspirin, ACCU-CHEK NANO SMARTVIEW, ACCU-CHEK FASTCLIX LANCETS, GLIPIZIDE XL, hydrOXYzine, Vitamin D3, naproxen, emtricitabine-rilpivir-tenofovir AF, sitaGLIPtin-metformin, losartan, DULoxetine, glucose blood, and fluconazole.   Meds ordered this encounter  Medications  . fluconazole (DIFLUCAN) 150 MG tablet    Sig: Take 1 tablet (150 mg total) by mouth daily.    Dispense:  1 tablet    Refill:  2    Order Specific Question:   Supervising Provider    Answer:   Carlyle Basques [[id number]]     Follow-up: Return if symptoms worsen or fail to improve.   Terri Piedra, MSN, [facility]

## 2017-05-19 ENCOUNTER — Other Ambulatory Visit: Payer: Self-pay | Admitting: Family

## 2017-05-19 ENCOUNTER — Telehealth: Payer: Self-pay | Admitting: *Deleted

## 2017-05-19 LAB — URINE CYTOLOGY ANCILLARY ONLY
Chlamydia: NEGATIVE
Neisseria Gonorrhea: NEGATIVE

## 2017-05-19 LAB — CYTOLOGY, (ORAL, ANAL, URETHRAL) ANCILLARY ONLY
Chlamydia: NEGATIVE
Neisseria Gonorrhea: NEGATIVE

## 2017-05-19 MED ORDER — METRONIDAZOLE 500 MG PO TABS: 500.0000 mg | ORAL_TABLET | Freq: Two times a day (BID) | ORAL | 0 refills | Status: AC

## 2017-05-19 NOTE — Telephone Encounter (Signed)
-----   Message from Veryl SpeakGregory D Calone, FNP sent at 05/19/2017  8:14 AM EDT ----- Please inform patient that her lab work shows she does have an infection.  I have sent in a prescription for metronidazole for her to take.  Please avoid alcohol while on this medication.  Recommend follow-up with gynecology or primary care if symptoms do not improve.

## 2017-05-19 NOTE — Telephone Encounter (Signed)
Called the patient twice to tell her the results of her recent visit and the line picked up but no one said anything. Will call back later.

## 2017-05-19 NOTE — Progress Notes (Unsigned)
Lab work received with results consistent with bacterial vaginosis.  Start metronidazole.  Follow-up with gynecology or primary care if symptoms worsen or do not improve.

## 2017-05-22 ENCOUNTER — Other Ambulatory Visit: Payer: Self-pay

## 2017-05-22 ENCOUNTER — Telehealth: Payer: Self-pay

## 2017-05-22 LAB — WET PREP FOR TRICH, YEAST, CLUE

## 2017-05-22 LAB — WET PREP BY MOLECULAR PROBE
Candida species: NOT DETECTED
MICRO NUMBER:: 90393973
SPECIMEN QUALITY: ADEQUATE
Trichomonas vaginosis: NOT DETECTED

## 2017-05-23 NOTE — Telephone Encounter (Signed)
Called the patient back as she left a message. Advised her medication sent in Monday 05/19/17 and should be ready for pick up. Advised her to call the office with any questions.

## 2017-05-28 ENCOUNTER — Other Ambulatory Visit: Payer: Self-pay | Admitting: Infectious Diseases

## 2017-05-28 DIAGNOSIS — B20 Human immunodeficiency virus [HIV] disease: Secondary | ICD-10-CM

## 2017-06-02 ENCOUNTER — Encounter: Payer: Medicaid Other | Admitting: Dietician

## 2017-06-16 ENCOUNTER — Encounter: Payer: Medicaid Other | Admitting: Dietician

## 2017-06-24 NOTE — Addendum Note (Signed)
Addended by: Neomia Dear on: 06/24/2017 07:45 PM   Modules accepted: Orders

## 2017-06-28 ENCOUNTER — Other Ambulatory Visit: Payer: Self-pay | Admitting: Infectious Diseases

## 2017-06-28 DIAGNOSIS — B20 Human immunodeficiency virus [HIV] disease: Secondary | ICD-10-CM

## 2017-07-07 ENCOUNTER — Other Ambulatory Visit: Payer: Self-pay | Admitting: Infectious Diseases

## 2017-07-07 DIAGNOSIS — B20 Human immunodeficiency virus [HIV] disease: Secondary | ICD-10-CM

## 2017-07-07 NOTE — Progress Notes (Deleted)
   CC: ***  HPI:  [name] is a 52 y.o. f with HIV infection, diabetes mellitus, hx of positive ppd, and major depressive disorder who presents for diabetes follow up. Please see problem based charting for evaluation, assessment, and plan.   Past Medical History:  Diagnosis Date  . Depression   . Diabetes mellitus   . History of positive PPD, treatment status unknown   . HIV infection (HCC)    Review of Systems:  ***  Physical Exam:  There were no vitals filed for this visit. ***  Assessment & Plan:   See Encounters Tab for problem based charting.  Diabetes Mellitus type 2 The patient is taking janumet 50-500mg  bid and lantus 30u qd. The patient's last a1c>14 on 05/05/17. The patient's home blood glucose measurements over the past month have ranged ***. The patient does/does not note episodes of hypoglycemia.   He/yes*** or she is compliant with medication.    Patient's weight changes ***   HIV infection The patient was last seen for her HIV diagnosis on 02/03/18 by Dr. Ninetta Vaughn. Patient's last CD4 count was 1190 on 02/03/18 and HIV quantitative RNA was 53. The  Patient is currently taking Odefsey once daily with breakfast. The patient is due for a follow up visit with ID in June 2019.   Major Depressive Disorder The patient scored *** on her PHQ9. She is currently not on any pharmaceutical agents.    Health Maintenance Pap smear- had a hysterectomy done in 2004 colonoscopy  Patient {GC/GE:[id number]::"discussed with","seen with"} Dr. {NAMES:[id number]::"Butcher","Granfortuna","E. Hoffman","Klima","Mullen","Narendra","Raines","Vincent"}

## 2017-07-08 ENCOUNTER — Ambulatory Visit: Payer: Medicaid Other

## 2017-07-11 ENCOUNTER — Encounter: Payer: Medicaid Other | Admitting: Internal Medicine

## 2017-07-22 NOTE — Addendum Note (Signed)
Addended by: Alesia MorinPOOLE, TRAVIS F on: 07/22/2017 04:07 PM   Modules accepted: Orders

## 2017-07-29 ENCOUNTER — Other Ambulatory Visit: Payer: Medicaid Other

## 2017-07-29 DIAGNOSIS — B20 Human immunodeficiency virus [HIV] disease: Secondary | ICD-10-CM

## 2017-07-30 LAB — T-HELPER CELL (CD4) - (RCID CLINIC ONLY)
CD4 % Helper T Cell: 38 % (ref 33–55)
CD4 T CELL ABS: 1230 /uL (ref 400–2700)

## 2017-07-31 LAB — HIV-1 RNA QUANT-NO REFLEX-BLD
HIV 1 RNA Quant: <20 copies/mL
HIV-1 RNA Quant, Log: <1.3 Log copies/mL

## 2017-08-06 ENCOUNTER — Telehealth: Payer: Self-pay

## 2017-08-06 NOTE — Telephone Encounter (Signed)
PT called today requesting to go over labs that were done 07/29/17. Pt is just curious to see what they are and how she is doing. PT would also like to go over previous medications and see if she would qualify for the class lawsuit for HIV medications that could cause bone density issues and kidney function problems. Pt would like a call back regarding labs when possible. Will route message to MD. Lorenso CourierJose L Landyn Lorincz, CMA

## 2017-08-12 ENCOUNTER — Encounter: Payer: Medicaid Other | Admitting: Infectious Diseases

## 2017-08-13 ENCOUNTER — Telehealth: Payer: Self-pay | Admitting: *Deleted

## 2017-08-13 NOTE — Telephone Encounter (Signed)
Patient called to advise she is concerned about her recent weight loss and wanted to know her lab results. Gave her the results and she was happy but concerned because she is losing weight. She advised her glucose has been 270-500 daily and she feels tired. Advised her to contact her PCP and let them know and try to make an appt with them for that soon. Reminded her of the appt here 09/01/17 and ended the call.

## 2017-09-05 ENCOUNTER — Encounter: Payer: Self-pay | Admitting: Infectious Diseases

## 2017-09-05 ENCOUNTER — Ambulatory Visit (INDEPENDENT_AMBULATORY_CARE_PROVIDER_SITE_OTHER): Payer: Medicaid Other | Admitting: Infectious Diseases

## 2017-09-05 VITALS — BP 128/82 | HR 80 | Temp 98.1°F | Ht 62.0 in | Wt 156.0 lb

## 2017-09-05 DIAGNOSIS — IMO0002 Reserved for concepts with insufficient information to code with codable children: Secondary | ICD-10-CM

## 2017-09-05 DIAGNOSIS — E1165 Type 2 diabetes mellitus with hyperglycemia: Secondary | ICD-10-CM

## 2017-09-05 DIAGNOSIS — Z79899 Other long term (current) drug therapy: Secondary | ICD-10-CM

## 2017-09-05 DIAGNOSIS — Z113 Encounter for screening for infections with a predominantly sexual mode of transmission: Secondary | ICD-10-CM | POA: Diagnosis present

## 2017-09-05 DIAGNOSIS — B20 Human immunodeficiency virus [HIV] disease: Secondary | ICD-10-CM | POA: Diagnosis not present

## 2017-09-05 DIAGNOSIS — E1140 Type 2 diabetes mellitus with diabetic neuropathy, unspecified: Secondary | ICD-10-CM

## 2017-09-05 NOTE — Progress Notes (Signed)
   Subjective:    Patient ID: Rhona RaiderLisa M Culpepper, female    DOB: 24-Aug-1965, 52 y.o.   MRN: [id number]  HPI 52yo F with hx of DM2 (dx 2002, on insulin) and HIV+.On complera 12-2010, changed to odefsy 02-2015. Was started on lexapro- at mental health.  Has been seen by nephro. Last Cr 0.93 01-2017.   FSG have been irregular 120-300. Watching diet, more baked foods. Walks everyday.   HIV 1 RNA Quant  Date Value  07/29/2017 <20 NOT DETECTED copies/mL  02/03/2017 53 Copies/mL (H)  07/31/2016 <20 NOT DETECTED copies/mL   CD4 T Cell Abs (/uL)  Date Value  07/29/2017 1,230  02/03/2017 1,190  07/31/2016 1,460   A1C 05-05-17 > 14% Had laser eye surgery April 2019.   Review of Systems  Constitutional: Negative for appetite change and unexpected weight change.  Eyes: Negative for visual disturbance.  Gastrointestinal: Negative for constipation and diarrhea.  Endocrine: Negative for polyuria.  Genitourinary: Negative for difficulty urinating and dysuria.  Neurological: Negative for numbness.  Psychiatric/Behavioral: Negative for sleep disturbance.  Please see HPI. All other systems reviewed and negative.      Objective:   Physical Exam  Constitutional: She appears well-developed and well-nourished.  HENT:  Mouth/Throat: No oropharyngeal exudate.  Eyes: Pupils are equal, round, and reactive to light. EOM are normal.  Neck: Normal range of motion. Neck supple.  Cardiovascular: Normal rate, regular rhythm and normal heart sounds.  Pulmonary/Chest: Effort normal and breath sounds normal.  Abdominal: Soft. Bowel sounds are normal. There is no tenderness. There is no guarding.  Musculoskeletal: She exhibits no edema.  Lymphadenopathy:    She has no cervical adenopathy.  Psychiatric: She has a normal mood and affect.          Assessment & Plan:

## 2017-09-05 NOTE — Assessment & Plan Note (Signed)
Has seen ophtho although she has [id number].  Her Glc has been out of control  She needs to f/u with her PCP

## 2017-09-05 NOTE — Assessment & Plan Note (Signed)
Will send her for PAP Will send her for Mammo Will send her for colon She refuses PCV Offered/refused condoms.  Continue odefsy rtc in 6 months.

## 2017-12-03 ENCOUNTER — Other Ambulatory Visit: Payer: Self-pay | Admitting: Internal Medicine

## 2017-12-03 DIAGNOSIS — F322 Major depressive disorder, single episode, severe without psychotic features: Secondary | ICD-10-CM

## 2018-02-03 ENCOUNTER — Other Ambulatory Visit: Payer: Self-pay | Admitting: Internal Medicine

## 2018-02-03 ENCOUNTER — Other Ambulatory Visit: Payer: Self-pay | Admitting: Infectious Diseases

## 2018-02-03 DIAGNOSIS — B20 Human immunodeficiency virus [HIV] disease: Secondary | ICD-10-CM

## 2018-02-03 DIAGNOSIS — F322 Major depressive disorder, single episode, severe without psychotic features: Secondary | ICD-10-CM

## 2018-02-23 ENCOUNTER — Ambulatory Visit: Payer: Medicaid Other | Admitting: Infectious Diseases

## 2018-02-25 ENCOUNTER — Encounter: Payer: Self-pay | Admitting: Infectious Diseases

## 2018-02-25 ENCOUNTER — Ambulatory Visit (INDEPENDENT_AMBULATORY_CARE_PROVIDER_SITE_OTHER): Payer: Medicaid Other | Admitting: Infectious Diseases

## 2018-02-25 ENCOUNTER — Other Ambulatory Visit (HOSPITAL_COMMUNITY)
Admission: RE | Admit: 2018-02-25 | Discharge: 2018-02-25 | Disposition: A | Discharge status: Home / Self Care | Payer: Medicaid Other | Source: Ambulatory Visit | Attending: Infectious Diseases | Admitting: Infectious Diseases

## 2018-02-25 VITALS — BP 123/81 | HR 96 | Temp 98.1°F | Wt 161.1 lb

## 2018-02-25 DIAGNOSIS — R8290 Unspecified abnormal findings in urine: Secondary | ICD-10-CM | POA: Insufficient documentation

## 2018-02-25 DIAGNOSIS — N941 Unspecified dyspareunia: Secondary | ICD-10-CM | POA: Insufficient documentation

## 2018-02-25 DIAGNOSIS — E1140 Type 2 diabetes mellitus with diabetic neuropathy, unspecified: Secondary | ICD-10-CM

## 2018-02-25 DIAGNOSIS — IMO0002 Reserved for concepts with insufficient information to code with codable children: Secondary | ICD-10-CM

## 2018-02-25 DIAGNOSIS — B20 Human immunodeficiency virus [HIV] disease: Secondary | ICD-10-CM

## 2018-02-25 DIAGNOSIS — E1165 Type 2 diabetes mellitus with hyperglycemia: Secondary | ICD-10-CM

## 2018-02-25 NOTE — Assessment & Plan Note (Signed)
Onset acutely over the last 3 weeks - differentials to include vaginal atrophy d/t hormonal changes, pelvic infection, vaginismus. Will have her come back tomorrow for pelvic exam to help determine etiology. May need GYN referral.

## 2018-02-25 NOTE — Assessment & Plan Note (Signed)
History of recurrent BV and vaginal candidiasis. Will check urine today for glucose/protein/wbc. No concern for UTI with lack of symptoms. Pelvic exam tomorrow.

## 2018-02-25 NOTE — Assessment & Plan Note (Signed)
Lab Results  Component Value Date   HGBA1C >14.0 05/05/2017   She has not had her labs checked in sometime - will repeat today. She is not taking her insulin regularly and eats liberally. I explained that her recurrent vaginal yeast infections and UTIs are directly related to uncontrolled blood sugars. She was unaware of this.

## 2018-02-25 NOTE — Patient Instructions (Addendum)
Please schedule an appointment up front for a pap smear tomorrow Thursday at 1:30 pm.   Vaccines given today:  Flu shot  Please stop by the lab for blood work and a urine sample today.   Please continue your Odefsey every day with food - as you know every pill is important.    Please call [facility] @ [phone] to schedule a dental appointment

## 2018-02-25 NOTE — Progress Notes (Signed)
Name: Ashley Vaughn  DOB: [birth year] MRN: [id number] PCP: [facility]    Patient Active Problem List   Diagnosis Date Noted  . Cloudy urine 02/25/2018  . Dyspareunia, female 02/25/2018  . Screening examination for venereal disease 03/03/2014  . Encounter for long-term (current) use of medications 03/03/2014  . Health care maintenance 03/18/2012  . Major depressive disorder, single episode, severe ([place]) 01/05/2010  . Human immunodeficiency virus (HIV) disease ([place]) 10/13/2008  . 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  . Uncontrolled type 2 diabetes with neuropathy ([place]) 10/29/1999     Brief Narrative:  Ashley Vaughn is a 53 y.o. female with HIV disease, Dx 2010. CD4 nadir > 200 and started HAART at entry to care. HIV Risk: heterosexual. History of OIs: none  Previous Regimens: . Odefsey >> suppressed  Genotypes: . No RT/PI mutations  Subjective:  CC:  Ashley Vaughn is here for HIV follow up care. Complaining of 3 days of cloudy urine and pain with sex/penetration.   HPI:  She is doing well on her Vernell Leep and takes it with food, however misses some doses occasionally. She goes back and forth with how often she really misses (sometimes only gets 4 doses a week in but other times does every day) but always takes this with food. She has no new medications to discuss today. She is currently sexually active with female partner but uncertain she is his only partner. She has noticed some cloudy urine over the last 3 days without other symptoms and denies specifically fevers, itching, discharge, abdominal pain/discomfort. She does report frequent yeast infections in the past. She is diabetic, does not check blood sugars at home ("can't keep up with a meter") and last A1c she recalls was 14%. She takes her insulin (Lantus) only sometimes because she feels it gets her too low. She also takes Janumet and Glipizide daily.  She had a hysterecomy in 2004 for fibroids but feels that she is going  through menopause currently with onset of hot flashes and sweating at night. She does report over the last 3 weeks "tightening up and pain when he penetrates her." Denies any external lesions but does have a fair amount of pain with sex that is frustrating for her and her partner.   Needs dental visit. Feels her depression is under much better control compared to last year - she attributes much of this improvement to the fact that she is in a long-term relationship at present.   Review of Systems  Constitutional: Positive for diaphoresis. Negative for chills, fever, malaise/fatigue and weight loss.  HENT: Negative for sore throat.        Has some broken teeth. No pain   Respiratory: Negative for cough and sputum production.   Cardiovascular: Negative for chest pain and leg swelling.  Gastrointestinal: Negative for abdominal pain, diarrhea and vomiting.  Genitourinary: Negative for dysuria and flank pain.       Dyspareunia   Musculoskeletal: Negative for joint pain, myalgias and neck pain.  Skin: Negative for rash.  Neurological: Negative for dizziness, tingling and headaches.  Psychiatric/Behavioral: Negative for depression and substance abuse. The patient is not nervous/anxious and does not have insomnia.     Past Medical History:  Diagnosis Date  . Depression   . Diabetes mellitus   . History of positive PPD, treatment status unknown   . HIV infection [facility])     Outpatient Medications Prior to Visit  Medication Sig Dispense Refill  . ACCU-CHEK FASTCLIX LANCETS  MISC 1 each by Does not apply route 1 day or 1 dose. Check blood sugar 4x a day before meals and bedtime  2 days each week. Dx code- 250.00. 102 each 6  . aspirin 81 MG EC tablet Take 81 mg by mouth daily.      . Blood Glucose Monitoring Suppl (ACCU-CHEK NANO SMARTVIEW) W/DEVICE KIT 1 each by Does not apply route 2 (two) times daily. Check blood sugar 4x a day before meals and bedtime  2 days each week. Dx code- 250.00. 1 kit 0    . GLIPIZIDE XL 10 MG 24 hr tablet TAKE 2 TABLETS BY MOUTH DAILY 60 tablet 11  . glucose blood (ACCU-CHEK GUIDE) test strip Use as instructed 100 each 12  . Insulin Glargine (LANTUS SOLOSTAR) 100 UNIT/ML Solostar Pen Inject 30 Units into the skin daily.     Marland Kitchen losartan (COZAAR) 25 MG tablet Take 1 tablet (25 mg total) by mouth daily. 90 tablet 2  . ODEFSEY 200-25-25 MG TABS tablet TAKE 1 TABLET BY MOUTH DAILY WITH BREAKFAST 30 tablet 0  . sitaGLIPtin-metformin (JANUMET) 50-500 MG tablet Take 1 tablet by mouth 2 (two) times daily with a meal. 60 tablet 2  . Cholecalciferol (VITAMIN D3) 5000 units TABS Take 5,000 Units by mouth daily.    . DULoxetine (CYMBALTA) 30 MG capsule Take 1 capsule (30 mg total) by mouth 2 (two) times daily. (Patient not taking: Reported on 02/25/2018) 60 capsule 2  . fluconazole (DIFLUCAN) 150 MG tablet Take 1 tablet (150 mg total) by mouth daily. (Patient not taking: Reported on 02/25/2018) 1 tablet 2  . hydrOXYzine (VISTARIL) 25 MG capsule Take 25 mg by mouth.    . naproxen (NAPROSYN) 500 MG tablet Take 500 mg by mouth 2 (two) times daily with a meal.     No facility-administered medications prior to visit.      Allergies  Allergen Reactions  . Fluorescein Itching and Rash    INTRAVENOUSLY    Social History   Tobacco Use  . Smoking status: Never Smoker  . Smokeless tobacco: Never Used  Substance Use Topics  . Alcohol use: Yes    Alcohol/week: 0.0 standard drinks    Comment: occasional  . Drug use: No    Comment: h/o cocaine abuse, clean since 2009    Family History  Problem Relation Age of Onset  . Diabetes Mother   . Hypertension Mother   . Heart disease Father   . Arthritis Father   . Diabetes Maternal Uncle     Social History   Substance and Sexual Activity  Sexual Activity Never  . Partners: Male   Comment: pt. given condoms     Objective:   Vitals:   02/25/18 1350  BP: 123/81  Pulse: 96  Temp: 98.1 F (36.7 C)  TempSrc: Oral   Weight: 161 lb 1.9 oz (73.1 kg)   Body mass index is 29.47 kg/m.  Physical Exam Vitals signs and nursing note reviewed.  Constitutional:      General: She is not in acute distress.    Appearance: Normal appearance. She is not ill-appearing.  HENT:     Nose: No congestion.     Mouth/Throat:     Mouth: Mucous membranes are dry.     Dentition: Abnormal dentition.     Pharynx: Oropharynx is clear. No oropharyngeal exudate.  Eyes:     General: No scleral icterus.    Pupils: Pupils are equal, round, and reactive to light.  Cardiovascular:  Rate and Rhythm: Normal rate and regular rhythm.     Heart sounds: No murmur.  Pulmonary:     Effort: Pulmonary effort is normal. No respiratory distress.     Breath sounds: Normal breath sounds.  Abdominal:     General: Bowel sounds are normal. There is no distension.     Palpations: Abdomen is soft.     Tenderness: There is no abdominal tenderness. There is no right CVA tenderness or left CVA tenderness.  Genitourinary:    Comments: Deferred - scheduled vaginal pap/pelvic exam tomorrow  Skin:    General: Skin is warm and dry.     Capillary Refill: Capillary refill takes less than 2 seconds.     Coloration: Skin is not pale.     Findings: No rash.  Neurological:     Mental Status: She is alert and oriented to person, place, and time.  Psychiatric:        Mood and Affect: Mood normal.        Judgment: Judgment normal.     Lab Results Lab Results  Component Value Date   WBC 6.3 02/03/2017   HGB 11.3 (L) 02/03/2017   HCT 37.0 02/03/2017   MCV 71.4 (L) 02/03/2017   PLT 317 02/03/2017    Lab Results  Component Value Date   CREATININE 0.93 02/03/2017   BUN 10 02/03/2017   NA 138 02/03/2017   K 4.5 02/03/2017   CL 103 02/03/2017   CO2 27 02/03/2017    Lab Results  Component Value Date   ALT 13 02/03/2017   AST 10 02/03/2017   ALKPHOS 84 07/31/2016   BILITOT 0.3 02/03/2017    Lab Results  Component Value Date   CHOL  198 02/03/2017   HDL 56 02/03/2017   LDLCALC 125 (H) 02/03/2017   TRIG 78 02/03/2017   CHOLHDL 3.5 02/03/2017   HIV 1 RNA Quant  Date Value  07/29/2017 <20 NOT DETECTED copies/mL  02/03/2017 53 Copies/mL (H)  07/31/2016 <20 NOT DETECTED copies/mL   CD4 T Cell Abs (/uL)  Date Value  07/29/2017 1,230  02/03/2017 1,190  07/31/2016 1,460    Assessment & Plan:   Problem List Items Addressed This Visit      Unprioritized   Human immunodeficiency virus (HIV) disease ([place]) - Primary    Continue Odefsey - counseled today on adherence to ensure no emergence of resistance. Discussed U=U concept in addition to safe sex counseling and prevention of other STIs.  Will check labs today. She has Medicaid and access to medications/care.  Return tomorrow for pap smear/pelvic exam.  Needs mammogram scheduled - will provide information about appointment.  Return to clinic with me or other provider if she prefers in 6 months.       Relevant Orders   HIV-1 RNA quant-no reflex-bld   T-helper cell (CD4)- ([facility] clinic only)   RPR   COMPLETE METABOLIC PANEL WITH GFR   CBC with Differential/Platelet   Hemoglobin A1c   Uncontrolled type 2 diabetes with neuropathy [facility])    Lab Results  Component Value Date   HGBA1C >14.0 05/05/2017   She has not had her labs checked in sometime - will repeat today. She is not taking her insulin regularly and eats liberally. I explained that her recurrent vaginal yeast infections and UTIs are directly related to uncontrolled blood sugars. She was unaware of this.       Cloudy urine    History of recurrent BV and vaginal candidiasis. Will check  urine today for glucose/protein/wbc. No concern for UTI with lack of symptoms. Pelvic exam tomorrow.       Relevant Orders   Urinalysis   Urine cytology ancillary only   Dyspareunia, female    Onset acutely over the last 3 weeks - differentials to include vaginal atrophy d/t hormonal changes, pelvic infection,  vaginismus. Will have her come back tomorrow for pelvic exam to help determine etiology. May need GYN referral.          Ashley Madeira, MSN, NP-C [place] [facility] Pager: [phone] Office: [phone]  02/25/18  4:28 PM

## 2018-02-25 NOTE — Assessment & Plan Note (Signed)
Continue Odefsey - counseled today on adherence to ensure no emergence of resistance. Discussed U=U concept in addition to safe sex counseling and prevention of other STIs.  Will check labs today. She has Medicaid and access to medications/care.  Return tomorrow for pap smear/pelvic exam.  Needs mammogram scheduled - will provide information about appointment.  Return to clinic with me or other provider if she prefers in 6 months.

## 2018-02-26 ENCOUNTER — Ambulatory Visit: Payer: Medicaid Other | Admitting: Infectious Diseases

## 2018-02-26 LAB — URINE CYTOLOGY ANCILLARY ONLY
CHLAMYDIA, DNA PROBE: NEGATIVE
NEISSERIA GONORRHEA: NEGATIVE
Trichomonas: NEGATIVE

## 2018-02-26 NOTE — Progress Notes (Signed)
Blood sugars are still way out of control. Kidney function is elevated as well. Will discuss at pap smear appointment today. She needs referral to endocrinology if she is not going to use her insulin. She should also be on low dose ACE inhibitor. Will encourage her to make follow up appointment with her primary care team.

## 2018-02-27 ENCOUNTER — Telehealth: Payer: Self-pay | Admitting: Behavioral Health

## 2018-02-27 ENCOUNTER — Telehealth: Payer: Self-pay | Admitting: Infectious Diseases

## 2018-02-27 DIAGNOSIS — IMO0002 Reserved for concepts with insufficient information to code with codable children: Secondary | ICD-10-CM

## 2018-02-27 DIAGNOSIS — E1140 Type 2 diabetes mellitus with diabetic neuropathy, unspecified: Secondary | ICD-10-CM

## 2018-02-27 DIAGNOSIS — E1165 Type 2 diabetes mellitus with hyperglycemia: Principal | ICD-10-CM

## 2018-02-27 LAB — CBC WITH DIFFERENTIAL/PLATELET
ABSOLUTE MONOCYTES: 428 {cells}/uL (ref 200–950)
BASOS PCT: 0.6 %
Basophils Absolute: 41 cells/uL (ref 0–200)
EOS PCT: 1 %
Eosinophils Absolute: 69 cells/uL (ref 15–500)
HEMATOCRIT: 37.7 % (ref 35.0–45.0)
Hemoglobin: 11.5 g/dL — ABNORMAL LOW (ref 11.7–15.5)
LYMPHS ABS: 3340 {cells}/uL (ref 850–3900)
MCH: 21.7 pg — ABNORMAL LOW (ref 27.0–33.0)
MCHC: 30.5 g/dL — ABNORMAL LOW (ref 32.0–36.0)
MCV: 71.1 fL — AB (ref 80.0–100.0)
MPV: 10.4 fL (ref 7.5–12.5)
Monocytes Relative: 6.2 %
NEUTROS ABS: 3022 {cells}/uL (ref 1500–7800)
NEUTROS PCT: 43.8 %
Platelets: 328 10*3/uL (ref 140–400)
RBC: 5.3 10*6/uL — AB (ref 3.80–5.10)
RDW: 14.2 % (ref 11.0–15.0)
Total Lymphocyte: 48.4 %
WBC: 6.9 10*3/uL (ref 3.8–10.8)

## 2018-02-27 LAB — URINALYSIS
Bilirubin Urine: NEGATIVE
Ketones, ur: NEGATIVE
LEUKOCYTES UA: NEGATIVE
Nitrite: POSITIVE — AB
Specific Gravity, Urine: 1.035 (ref 1.001–1.03)
pH: <=5 (ref 5.0–8.0)

## 2018-02-27 LAB — COMPLETE METABOLIC PANEL WITH GFR
AG RATIO: 1.1 (calc) (ref 1.0–2.5)
ALBUMIN MSPROF: 4.1 g/dL (ref 3.6–5.1)
ALKALINE PHOSPHATASE (APISO): 109 U/L (ref 33–130)
ALT: 12 U/L (ref 6–29)
AST: 10 U/L (ref 10–35)
BILIRUBIN TOTAL: 0.2 mg/dL (ref 0.2–1.2)
BUN / CREAT RATIO: 17 (calc) (ref 6–22)
BUN: 19 mg/dL (ref 7–25)
CO2: 24 mmol/L (ref 20–32)
Calcium: 9.6 mg/dL (ref 8.6–10.4)
Chloride: 100 mmol/L (ref 98–110)
Creat: 1.14 mg/dL — ABNORMAL HIGH (ref 0.50–1.05)
GFR, Est African American: 64 mL/min/{1.73_m2} (ref >=60)
GFR, Est Non African American: 56 mL/min/{1.73_m2} — ABNORMAL LOW (ref >=60)
GLOBULIN: 3.6 g/dL (ref 1.9–3.7)
Glucose, Bld: 407 mg/dL — ABNORMAL HIGH (ref 65–99)
POTASSIUM: 4.4 mmol/L (ref 3.5–5.3)
SODIUM: 137 mmol/L (ref 135–146)
Total Protein: 7.7 g/dL (ref 6.1–8.1)

## 2018-02-27 LAB — HEMOGLOBIN A1C: Hgb A1c MFr Bld: >14 % of total Hgb — ABNORMAL HIGH (ref <5.7)

## 2018-02-27 LAB — T-HELPER CELL (CD4) - (RCID CLINIC ONLY)
CD4 T CELL HELPER: 37 % (ref 33–55)
CD4 T Cell Abs: 1350 /uL (ref 400–2700)

## 2018-02-27 LAB — RPR: RPR Ser Ql: NONREACTIVE

## 2018-02-27 LAB — HIV-1 RNA QUANT-NO REFLEX-BLD
HIV 1 RNA QUANT: NOT DETECTED {copies}/mL
HIV-1 RNA QUANT, LOG: NOT DETECTED {Log_copies}/mL

## 2018-02-27 NOTE — Telephone Encounter (Signed)
[facility] called to report 407 glucose from 02/25/2018.  Rexene Alberts is already aware of results and patient to follow up with her promary care team. Angeline Slim RN

## 2018-02-27 NOTE — Telephone Encounter (Signed)
Thank you - I have tried to get in touch with Misty StanleyLisa today and she is not answering calls after no-show for pelvic exam this week. She unfortunately does not have MyChart either. Will try again on mobile to inform of plan for endocrinology referral and to try to reschedule exam.

## 2018-02-27 NOTE — Telephone Encounter (Signed)
Ashley Vaughn no-showed her follow up appointment with me to review labs and perform pelvic/exam and pap smear.   Her A1C is again immeasurable > 14% and creatinine increased. Likely the cloudy urine explained with the significant  proteinuria/glycosuria demonstrated on urinalysis. She is not taking her diabetes medications as prescribed. I would like to get her in to see endocrinology to discuss further. I have attempted to call her a few times on listed number however her voicemail is full.   Will continue to try to reach out to her.

## 2018-03-02 ENCOUNTER — Telehealth: Payer: Self-pay

## 2018-03-02 DIAGNOSIS — E08649 Diabetes mellitus due to underlying condition with hypoglycemia without coma: Secondary | ICD-10-CM

## 2018-03-02 NOTE — Telephone Encounter (Signed)
Patient calling to check on lab results. She wonders why her urine is still smelling badly.   Patient informed Judeth Cornfield thinks the smell is related to her elevated blood sugars.  Patient admits she does not take medication as she should due to giving care to four grandchildren.  She was advised a referral has been made to a endocrine specialist and to expect a call soon.

## 2018-03-03 NOTE — Addendum Note (Signed)
Addended by: Andree Coss on: 03/03/2018 10:54 AM   Modules accepted: Orders

## 2018-03-03 NOTE — Telephone Encounter (Signed)
Thank you for your coordination of her care.

## 2018-03-03 NOTE — Telephone Encounter (Signed)
In July, patient's primary care physician ([facility]) referred her to [facility]([phone]).  [place] no-showed twice and cannot be rescheduled there. She has been referred to internal medicine for diabetic education with [facility], but has no-showed. Will reach out to Internal Medicine to see if she can be seen there again.  RN spoke with [facility]. They take 1 new medicaid patient a month, next available is August.   Per Judeth Cornfield, will send referral to nutrition as well and encourage the patient to see her primary/follow through with switch to Internal Medicine asap. Andree Coss, RN

## 2018-03-05 ENCOUNTER — Other Ambulatory Visit: Payer: Self-pay | Admitting: Infectious Diseases

## 2018-03-05 DIAGNOSIS — B20 Human immunodeficiency virus [HIV] disease: Secondary | ICD-10-CM

## 2018-03-09 ENCOUNTER — Encounter: Payer: Self-pay | Admitting: Internal Medicine

## 2018-04-09 ENCOUNTER — Other Ambulatory Visit: Payer: Self-pay | Admitting: Infectious Diseases

## 2018-04-09 DIAGNOSIS — B20 Human immunodeficiency virus [HIV] disease: Secondary | ICD-10-CM

## 2018-04-15 ENCOUNTER — Ambulatory Visit: Payer: Medicaid Other | Admitting: Registered"

## 2018-05-14 ENCOUNTER — Telehealth: Payer: Self-pay

## 2018-05-14 ENCOUNTER — Other Ambulatory Visit: Payer: Self-pay

## 2018-05-14 DIAGNOSIS — B20 Human immunodeficiency virus [HIV] disease: Secondary | ICD-10-CM

## 2018-05-14 MED ORDER — EMTRICITAB-RILPIVIR-TENOFOV AF 200-25-25 MG PO TABS: 1.0000 | ORAL_TABLET | Freq: Every day | ORAL | 2 refills | Status: DC

## 2018-05-14 NOTE — Telephone Encounter (Signed)
Patient called office requesting refill on all her medication. Informed patient that our office is only prescribing her odefsey, and that she should contact her PCP regarding refills on her other medication. Patient verbalized understanding and will reach out to PCP. Odefesy refills were sent to [facility] speciality. Lorenso Courier, [place]

## 2018-05-14 NOTE — Telephone Encounter (Signed)
In my last note I mentioned 6 month follow up with either Dr. Ninetta Lights (who she usually sees) or me.  She also needs pap smear set up and primary care for her diabetes (which is under very very poor control).   OK to refill her [facility]

## 2018-06-22 ENCOUNTER — Telehealth: Payer: Self-pay

## 2018-06-22 DIAGNOSIS — R8290 Unspecified abnormal findings in urine: Secondary | ICD-10-CM

## 2018-06-22 MED ORDER — CEPHALEXIN 500 MG PO CAPS: 500.0000 mg | ORAL_CAPSULE | Freq: Two times a day (BID) | ORAL | 0 refills | Status: DC

## 2018-06-22 NOTE — Telephone Encounter (Signed)
Patient called stating she is experiencing lower abdominal pain, painful urination and malodorous discharge.  LPN asked patient about her blood sugar readings. Patient states she currently dose not have a meter but has been trying to watch what she eats.  Patient encouraged to increase fluid intake and reach out to PCP for diabetes management. Routing to MD for advise.  [name], LPN

## 2018-06-22 NOTE — Telephone Encounter (Signed)
She needs keflex 500mg  po bid for 3 days She needs IM f/u Thanks

## 2018-06-22 NOTE — Telephone Encounter (Signed)
Patient called and left VM regarding Keflex sent to pharmacy. Instructions left to take twice daily for 3 days. If no improvement in 3 days patient should come in for UA/UCx. Also left message to follow up with Internal Medicine for DM.  Left call back number for the office for patient to call back with any further questions.  [name], LPN

## 2018-06-22 NOTE — Telephone Encounter (Signed)
C/o abd pain, cloudy urine.  If not better after 3 days of keflex, please check UA and UCx.  She needs IM f/u for her DM

## 2018-06-22 NOTE — Addendum Note (Signed)
Addended by: Anjeanette Petzold C on: 06/22/2018 11:46 AM   Modules accepted: Orders

## 2018-06-23 NOTE — Telephone Encounter (Signed)
Patient states she is starting to feel better. She "feels the pressure is starting to come off."  She has not yet made an appointment with primary care or diabetic nutrition.  RN gave her the phone number to reschedule with nutritionist and encouraged her to call primary care in [facility]. Patient verbalized understanding, agreement. Andree Coss, RN

## 2018-06-24 NOTE — Telephone Encounter (Signed)
Thank you :)

## 2018-08-24 ENCOUNTER — Other Ambulatory Visit: Payer: Medicaid Other

## 2018-09-10 ENCOUNTER — Ambulatory Visit (INDEPENDENT_AMBULATORY_CARE_PROVIDER_SITE_OTHER): Payer: Medicaid Other | Admitting: Infectious Diseases

## 2018-09-10 ENCOUNTER — Encounter: Payer: Self-pay | Admitting: Infectious Diseases

## 2018-09-10 ENCOUNTER — Other Ambulatory Visit: Payer: Self-pay

## 2018-09-10 VITALS — BP 125/75 | HR 89 | Temp 97.9°F

## 2018-09-10 DIAGNOSIS — E1140 Type 2 diabetes mellitus with diabetic neuropathy, unspecified: Secondary | ICD-10-CM | POA: Diagnosis not present

## 2018-09-10 DIAGNOSIS — B20 Human immunodeficiency virus [HIV] disease: Secondary | ICD-10-CM | POA: Diagnosis not present

## 2018-09-10 DIAGNOSIS — Z79899 Other long term (current) drug therapy: Secondary | ICD-10-CM | POA: Diagnosis not present

## 2018-09-10 DIAGNOSIS — IMO0002 Reserved for concepts with insufficient information to code with codable children: Secondary | ICD-10-CM

## 2018-09-10 DIAGNOSIS — Z113 Encounter for screening for infections with a predominantly sexual mode of transmission: Secondary | ICD-10-CM

## 2018-09-10 DIAGNOSIS — Z23 Encounter for immunization: Secondary | ICD-10-CM

## 2018-09-10 DIAGNOSIS — J219 Acute bronchiolitis, unspecified: Secondary | ICD-10-CM | POA: Diagnosis not present

## 2018-09-10 DIAGNOSIS — E1165 Type 2 diabetes mellitus with hyperglycemia: Secondary | ICD-10-CM | POA: Diagnosis not present

## 2018-09-10 NOTE — Assessment & Plan Note (Signed)
Dental Pap mammo Colon HIV is well controlled.  Offered/refused condoms. PCV 13, menveo today.  rtc in 6 months THP referral in [facility].

## 2018-09-10 NOTE — Assessment & Plan Note (Addendum)
Will get her in with IMTS and Butch Penny.  ophtho She is very poorly controlled.

## 2018-09-10 NOTE — Addendum Note (Signed)
Addended by: Landis Gandy on: 09/10/2018 03:28 PM   Modules accepted: Orders

## 2018-09-10 NOTE — Progress Notes (Signed)
   Subjective:    Patient ID: Ashley Vaughn, female    DOB: 05-Jun-1965, 53 y.o.   MRN: [id number]  HPI  53yo F with hx of DM2 (dx 2002, on insulin) and HIV+ (dx Oct 2010).On complera 12-2010, changed to odefsy 02-2015.Taking with food.  Was started on lexapro- at mental health.  Has been seen by nephro. Last Cr 1.14, UA 2+ prot  (02-2018).   FSG was 489/500/460. Just got a new meter. Has had sweats, fatigue.   Needs housing, daughter is in jail. She lives in house with 6 people- boyfriend, 4 grandkids.        A1C 02-2018 > 14%. Does not have insulin needles.  Had laser eye (L cataract) surgery April 2019. No further ophtho f/u.  Last mammo- 2013? Last PAP (prev hysterectomy)- ? No hx of colonoscopy Not walking, has a car now.   HIV 1 RNA Quant  Date Value  02/25/2018 <20 NOT DETECTED copies/mL  07/29/2017 <20 NOT DETECTED copies/mL  02/03/2017 53 Copies/mL (H)   CD4 T Cell Abs (/uL)  Date Value  02/25/2018 1,350  07/29/2017 1,230  02/03/2017 1,190     Review of Systems  Constitutional: Positive for fatigue. Negative for appetite change, chills, fever and unexpected weight change.  Eyes: Negative for visual disturbance.  Respiratory: Positive for cough. Negative for shortness of breath and stridor.   Gastrointestinal: Positive for diarrhea.  Genitourinary: Positive for frequency. Negative for difficulty urinating.  Neurological: Positive for numbness.  Please see HPI. All other systems reviewed and negative.      Objective:   Physical Exam Constitutional:      Appearance: Normal appearance.  HENT:     Mouth/Throat:     Mouth: Mucous membranes are moist.     Pharynx: No oropharyngeal exudate.  Eyes:     Extraocular Movements: Extraocular movements intact.     Pupils: Pupils are equal, round, and reactive to light.  Neck:     Musculoskeletal: Normal range of motion and neck supple.  Cardiovascular:     Rate and Rhythm: Normal rate and regular rhythm.   Pulses:          Dorsalis pedis pulses are 3+ on the right side and 3+ on the left side.  Pulmonary:     Effort: Pulmonary effort is normal.     Breath sounds: Normal breath sounds.  Abdominal:     General: Bowel sounds are normal. There is no distension.     Palpations: Abdomen is soft.     Tenderness: There is no abdominal tenderness.  Musculoskeletal: Normal range of motion.     Right foot: Normal range of motion. No deformity, Charcot foot or foot drop.     Left foot: Normal range of motion. Deformity present. No Charcot foot or foot drop.  Feet:     Right foot:     Skin integrity: Skin integrity normal.     Left foot:     Skin integrity: Skin integrity normal.  Neurological:     General: No focal deficit present.     Mental Status: She is alert.  Psychiatric:        Mood and Affect: Mood normal.       Assessment & Plan:

## 2018-09-10 NOTE — Assessment & Plan Note (Signed)
She is convinced she has this.  Will have her seen by pulm for PFT.  Has DOE, rare cough.

## 2018-09-11 ENCOUNTER — Other Ambulatory Visit: Payer: Self-pay | Admitting: Infectious Diseases

## 2018-09-11 DIAGNOSIS — Z1231 Encounter for screening mammogram for malignant neoplasm of breast: Secondary | ICD-10-CM

## 2018-09-11 DIAGNOSIS — B20 Human immunodeficiency virus [HIV] disease: Secondary | ICD-10-CM

## 2018-09-11 LAB — URINALYSIS, ROUTINE W REFLEX MICROSCOPIC
Bacteria, UA: NONE SEEN /HPF
Bilirubin Urine: NEGATIVE
Hgb urine dipstick: NEGATIVE
Hyaline Cast: NONE SEEN /LPF
Ketones, ur: NEGATIVE
Leukocytes,Ua: NEGATIVE
Nitrite: NEGATIVE
Specific Gravity, Urine: 1.036 — ABNORMAL HIGH (ref 1.001–1.03)
Squamous Epithelial / HPF: NONE SEEN /HPF (ref <=5)
pH: <=5 (ref 5.0–8.0)

## 2018-09-17 LAB — COMPREHENSIVE METABOLIC PANEL
AG Ratio: 1.1 (calc) (ref 1.0–2.5)
ALT: 10 U/L (ref 6–29)
AST: 10 U/L (ref 10–35)
Albumin: 3.9 g/dL (ref 3.6–5.1)
Alkaline phosphatase (APISO): 88 U/L (ref 37–153)
BUN/Creatinine Ratio: 13 (calc) (ref 6–22)
BUN: 16 mg/dL (ref 7–25)
CO2: 22 mmol/L (ref 20–32)
Calcium: 9.8 mg/dL (ref 8.6–10.4)
Chloride: 102 mmol/L (ref 98–110)
Creat: 1.21 mg/dL — ABNORMAL HIGH (ref 0.50–1.05)
Globulin: 3.6 g/dL (calc) (ref 1.9–3.7)
Glucose, Bld: 410 mg/dL — ABNORMAL HIGH (ref 65–99)
Potassium: 4.2 mmol/L (ref 3.5–5.3)
Sodium: 136 mmol/L (ref 135–146)
Total Bilirubin: 0.3 mg/dL (ref 0.2–1.2)
Total Protein: 7.5 g/dL (ref 6.1–8.1)

## 2018-09-17 LAB — HIV-1 RNA QUANT-NO REFLEX-BLD
HIV 1 RNA Quant: <20 copies/mL — AB
HIV-1 RNA Quant, Log: <1.3 Log copies/mL — AB

## 2018-09-17 LAB — CBC
HCT: 37.5 % (ref 35.0–45.0)
Hemoglobin: 11.8 g/dL (ref 11.7–15.5)
MCH: 22.5 pg — ABNORMAL LOW (ref 27.0–33.0)
MCHC: 31.5 g/dL — ABNORMAL LOW (ref 32.0–36.0)
MCV: 71.6 fL — ABNORMAL LOW (ref 80.0–100.0)
MPV: 10.6 fL (ref 7.5–12.5)
Platelets: 328 10*3/uL (ref 140–400)
RBC: 5.24 10*6/uL — ABNORMAL HIGH (ref 3.80–5.10)
RDW: 14.5 % (ref 11.0–15.0)
WBC: 7.4 10*3/uL (ref 3.8–10.8)

## 2018-09-17 LAB — RPR: RPR Ser Ql: NONREACTIVE

## 2018-09-30 ENCOUNTER — Other Ambulatory Visit: Payer: Self-pay | Admitting: Infectious Diseases

## 2018-09-30 DIAGNOSIS — B20 Human immunodeficiency virus [HIV] disease: Secondary | ICD-10-CM

## 2018-10-28 ENCOUNTER — Ambulatory Visit: Payer: Medicaid Other

## 2018-12-14 ENCOUNTER — Telehealth: Payer: Self-pay | Admitting: *Deleted

## 2018-12-14 NOTE — Telephone Encounter (Signed)
Patient called to report that she needs to see Dr Johnnye Sima to discuss new medication.

## 2018-12-16 ENCOUNTER — Ambulatory Visit: Payer: Medicaid Other | Admitting: Infectious Diseases

## 2018-12-25 ENCOUNTER — Telehealth: Payer: Self-pay

## 2018-12-25 NOTE — Telephone Encounter (Signed)
Patient called requesting advice about recent lab levels taken by her primary care provider. She stated that her Vitamin B was low and that they were recommending diet changes to improve it, but wanted to be sure it wasn't related to her HIV or HIV medication. Let her know that she should follow up with her primary care doctor about further directions related to her lab work and that we will reach out to Dr. Johnnye Sima about any possible relationship between HIV and Vitamin B.   Theo Reither Lorita Officer, RN

## 2018-12-28 NOTE — Telephone Encounter (Signed)
Should not be related to her medication.  Could be a marker for her overall health, diet.  Being on medication would be the best thing for this overall.  Thanks jeff

## 2019-01-01 ENCOUNTER — Encounter: Payer: Self-pay | Admitting: Infectious Diseases

## 2019-02-18 NOTE — Addendum Note (Signed)
Addended by: Dolan Amen D on: 02/18/2019 08:55 AM   Modules accepted: Orders

## 2019-02-25 ENCOUNTER — Other Ambulatory Visit: Payer: Medicaid Other

## 2019-03-11 ENCOUNTER — Telehealth: Payer: Self-pay

## 2019-03-11 NOTE — Telephone Encounter (Signed)
COVID-19 Pre-Screening Questions:03/11/19  Do you currently have a fever (>100 F), chills or unexplained body aches? NO  Are you currently experiencing new cough, shortness of breath, sore throat, runny nose? NO  .  Have you recently travelled outside the state of [place] in the last 14 days? NO  .  Have you been in contact with someone that is currently pending confirmation of Covid19 testing or has been confirmed to have the Covid19 virus?  NO   **If the patient answers NO to ALL questions -  advise the patient to please call the clinic before coming to the office should any symptoms develop.     

## 2019-03-12 ENCOUNTER — Encounter: Payer: Medicaid Other | Admitting: Infectious Diseases

## 2019-03-12 ENCOUNTER — Encounter: Payer: Self-pay | Admitting: Infectious Diseases

## 2019-03-12 ENCOUNTER — Other Ambulatory Visit: Payer: Self-pay

## 2019-03-12 ENCOUNTER — Ambulatory Visit (INDEPENDENT_AMBULATORY_CARE_PROVIDER_SITE_OTHER): Payer: Medicaid Other | Admitting: Infectious Diseases

## 2019-03-12 VITALS — BP 135/83 | HR 80 | Temp 98.0°F | Wt 159.0 lb

## 2019-03-12 DIAGNOSIS — Z113 Encounter for screening for infections with a predominantly sexual mode of transmission: Secondary | ICD-10-CM

## 2019-03-12 DIAGNOSIS — E1140 Type 2 diabetes mellitus with diabetic neuropathy, unspecified: Secondary | ICD-10-CM | POA: Diagnosis not present

## 2019-03-12 DIAGNOSIS — A609 Anogenital herpesviral infection, unspecified: Secondary | ICD-10-CM

## 2019-03-12 DIAGNOSIS — R300 Dysuria: Secondary | ICD-10-CM

## 2019-03-12 DIAGNOSIS — Z79899 Other long term (current) drug therapy: Secondary | ICD-10-CM | POA: Diagnosis not present

## 2019-03-12 DIAGNOSIS — E1165 Type 2 diabetes mellitus with hyperglycemia: Secondary | ICD-10-CM | POA: Diagnosis not present

## 2019-03-12 DIAGNOSIS — Z23 Encounter for immunization: Secondary | ICD-10-CM

## 2019-03-12 DIAGNOSIS — IMO0002 Reserved for concepts with insufficient information to code with codable children: Secondary | ICD-10-CM

## 2019-03-12 DIAGNOSIS — B20 Human immunodeficiency virus [HIV] disease: Secondary | ICD-10-CM

## 2019-03-12 HISTORY — DX: Anogenital herpesviral infection, unspecified: A60.9

## 2019-03-12 LAB — T-HELPER CELL (CD4) - (RCID CLINIC ONLY)
CD4 % Helper T Cell: 45 % (ref 33–65)
CD4 T Cell Abs: 1208 /uL (ref 400–1790)

## 2019-03-12 NOTE — Addendum Note (Signed)
Addended by: Lorenso Courier on: 03/12/2019 12:06 PM   Modules accepted: Orders

## 2019-03-12 NOTE — Addendum Note (Signed)
Addended by: Lorenso Courier on: 03/12/2019 11:12 AM   Modules accepted: Orders

## 2019-03-12 NOTE — Assessment & Plan Note (Signed)
Valtrex 1g bid for 5 days prn.

## 2019-03-12 NOTE — Addendum Note (Signed)
Addended by: Deziya Amero C on: 03/12/2019 10:47 AM   Modules accepted: Orders

## 2019-03-12 NOTE — Assessment & Plan Note (Signed)
Will get her with IMTS She needs colon, ophtho, mammo, pap, flu vax, dental,  Will get plain film of foot.

## 2019-03-12 NOTE — Progress Notes (Signed)
   Subjective:    Patient ID: Ashley Vaughn, female    DOB: May 01, 1965, 54 y.o.   MRN: [id number]  HPI 54yo F with hx of DM2 (dx 2002, on insulin) and HIV+ (dx Oct 2010).On complera 12-2010, changed to odefsy 02-2015.Taking with food.  Was started on lexapro- at mental health.  Has been seen by nephro.Last Cr 1.21.    FSG have been 300-400. She has not been to PCP for some time.  Daughter is in jail (gets released next month). She lives in house with 6 people- boyfriend, 4 grand-kids (she believes DSS is going to take them). She did get a larger house.  Has been out of work, sleeps a lot. Has difficulty with getting up.  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 She still has outbreaks and states they take longer to resolve a she gets older. Has never gotten rx for this (ACV or VTX).  Was dx with COVID in November.       A1C 02-2018 > 14%.  Had laser eye (L cataract) surgery April 2019.No further ophtho f/u. R eye still has cataract.  Last mammo- 2013? Last PAP (prev hysterectomy)- ? No hx of colonoscopy    Review of Systems  Constitutional: Negative for appetite change, chills, fever and unexpected weight change.  Gastrointestinal: Positive for constipation. Negative for diarrhea.  Genitourinary: Negative for difficulty urinating.  Neurological: Positive for numbness.  Psychiatric/Behavioral: Positive for sleep disturbance.       Objective:   Physical Exam Vitals reviewed.  Constitutional:      Appearance: Normal appearance.  HENT:     Mouth/Throat:     Mouth: Mucous membranes are moist.     Pharynx: No oropharyngeal exudate.  Eyes:     Extraocular Movements: Extraocular movements intact.     Pupils: Pupils are equal, round, and reactive to light.  Cardiovascular:     Rate and Rhythm: Normal rate and regular rhythm.  Pulmonary:     Effort: Pulmonary effort is normal.     Breath sounds: Normal breath sounds.  Abdominal:     General: Bowel sounds are normal.  There is no distension.     Palpations: Abdomen is soft.     Tenderness: There is no abdominal tenderness.  Musculoskeletal:        General: Normal range of motion.     Cervical back: Normal range of motion.     Right lower leg: No edema.     Left lower leg: No edema.     Right foot: No deformity, bunion, Charcot foot, foot drop or prominent metatarsal heads.     Left foot: Deformity (3/4 th toes sbubluxed. ) present. No bunion, Charcot foot, foot drop or prominent metatarsal heads.       Feet:  Feet:     Right foot:     Skin integrity: No ulcer.     Left foot:     Skin integrity: No ulcer.  Lymphadenopathy:     Cervical: No cervical adenopathy.  Neurological:     Mental Status: She is alert.           Assessment & Plan:

## 2019-03-12 NOTE — Addendum Note (Signed)
Addended by: Lorenso Courier on: 03/12/2019 11:10 AM   Modules accepted: Orders

## 2019-03-12 NOTE — Assessment & Plan Note (Signed)
She appears to be doing ok Multiple home stressors.  Offered/refused condoms. Is going to split with partner.  wll get her in with dental, IMTS, pap clinic, mammo, Flu, PSV 23 vax today  Check her labs today rtc in 6 months.

## 2019-03-12 NOTE — Addendum Note (Signed)
Addended by: Alanson Puls L on: 03/12/2019 11:13 AM   Modules accepted: Orders

## 2019-03-13 LAB — URINALYSIS, ROUTINE W REFLEX MICROSCOPIC
Bilirubin Urine: NEGATIVE
Hgb urine dipstick: NEGATIVE
Hyaline Cast: NONE SEEN /LPF
Ketones, ur: NEGATIVE
Leukocytes,Ua: NEGATIVE
Nitrite: NEGATIVE
RBC / HPF: NONE SEEN /HPF (ref 0–2)
Specific Gravity, Urine: 1.031 (ref 1.001–1.03)
pH: <=5 (ref 5.0–8.0)

## 2019-03-13 LAB — URINE CULTURE
MICRO NUMBER:: 10072066
SPECIMEN QUALITY:: ADEQUATE

## 2019-03-13 LAB — MICROALBUMIN / CREATININE URINE RATIO
Creatinine, Urine: 53 mg/dL (ref 20–275)
Microalb Creat Ratio: 268 mcg/mg creat — ABNORMAL HIGH (ref <30)
Microalb, Ur: 14.2 mg/dL

## 2019-03-16 LAB — COMPREHENSIVE METABOLIC PANEL
AG Ratio: 1.2 (calc) (ref 1.0–2.5)
ALT: 16 U/L (ref 6–29)
AST: 11 U/L (ref 10–35)
Albumin: 3.9 g/dL (ref 3.6–5.1)
Alkaline phosphatase (APISO): 87 U/L (ref 37–153)
BUN: 15 mg/dL (ref 7–25)
CO2: 26 mmol/L (ref 20–32)
Calcium: 9.9 mg/dL (ref 8.6–10.4)
Chloride: 102 mmol/L (ref 98–110)
Creat: 1.04 mg/dL (ref 0.50–1.05)
Globulin: 3.2 g/dL (calc) (ref 1.9–3.7)
Glucose, Bld: 367 mg/dL — ABNORMAL HIGH (ref 65–99)
Potassium: 4.4 mmol/L (ref 3.5–5.3)
Sodium: 138 mmol/L (ref 135–146)
Total Bilirubin: 0.3 mg/dL (ref 0.2–1.2)
Total Protein: 7.1 g/dL (ref 6.1–8.1)

## 2019-03-16 LAB — CBC
HCT: 36.4 % (ref 35.0–45.0)
Hemoglobin: 11 g/dL — ABNORMAL LOW (ref 11.7–15.5)
MCH: 21.7 pg — ABNORMAL LOW (ref 27.0–33.0)
MCHC: 30.2 g/dL — ABNORMAL LOW (ref 32.0–36.0)
MCV: 71.7 fL — ABNORMAL LOW (ref 80.0–100.0)
MPV: 10.1 fL (ref 7.5–12.5)
Platelets: 325 10*3/uL (ref 140–400)
RBC: 5.08 10*6/uL (ref 3.80–5.10)
RDW: 14.1 % (ref 11.0–15.0)
WBC: 5.8 10*3/uL (ref 3.8–10.8)

## 2019-03-16 LAB — HIV-1 RNA QUANT-NO REFLEX-BLD
HIV 1 RNA Quant: <20 copies/mL
HIV-1 RNA Quant, Log: <1.3 Log copies/mL

## 2019-03-16 LAB — HEMOGLOBIN A1C: Hgb A1c MFr Bld: >14 % of total Hgb — ABNORMAL HIGH (ref <5.7)

## 2019-03-16 LAB — RPR: RPR Ser Ql: NONREACTIVE

## 2019-03-17 ENCOUNTER — Telehealth: Payer: Self-pay | Admitting: *Deleted

## 2019-03-17 ENCOUNTER — Other Ambulatory Visit: Payer: Self-pay | Admitting: Infectious Diseases

## 2019-03-17 DIAGNOSIS — B20 Human immunodeficiency virus [HIV] disease: Secondary | ICD-10-CM

## 2019-03-17 NOTE — Telephone Encounter (Signed)
Thanks Michelle

## 2019-03-17 NOTE — Telephone Encounter (Signed)
RN called patient, asked where she was going for diabetic care. [facility] has made multiple referral attempts over the last year+, but patient has not yet followed through on any of these appointments.  RN let her know that her labs are worse and she needs to make the decision where she is going to go for her diabetes management.  [facility] stated she had a number, that she would call them today.  RN asked her to let us know how we can help if there is an issue, and to let us know where she establishes care. Andree Coss, RN

## 2019-03-17 NOTE — Telephone Encounter (Signed)
-----   Message from Randall Hiss, MD sent at 03/16/2019  9:20 AM EST ----- Does this patient have a PCP or endocrinologist managing her diabetes the A1c is greater than 14 and her blood sugars been in the 3-400 range ----- Message ----- From: Leory Plowman, [facility] [place] Sent: 03/12/2019   2:41 PM EST To: Randall Hiss, MD

## 2019-08-24 ENCOUNTER — Other Ambulatory Visit: Payer: Medicaid Other

## 2019-08-26 ENCOUNTER — Other Ambulatory Visit: Payer: Medicaid Other

## 2019-09-06 ENCOUNTER — Other Ambulatory Visit: Payer: Self-pay

## 2019-09-06 ENCOUNTER — Other Ambulatory Visit: Payer: Medicaid Other

## 2019-09-06 DIAGNOSIS — B20 Human immunodeficiency virus [HIV] disease: Secondary | ICD-10-CM

## 2019-09-06 DIAGNOSIS — Z79899 Other long term (current) drug therapy: Secondary | ICD-10-CM

## 2019-09-07 ENCOUNTER — Telehealth: Payer: Self-pay

## 2019-09-07 ENCOUNTER — Encounter: Payer: Medicaid Other | Admitting: Infectious Diseases

## 2019-09-07 ENCOUNTER — Telehealth: Payer: Self-pay | Admitting: Infectious Disease

## 2019-09-07 LAB — T-HELPER CELL (CD4) - (RCID CLINIC ONLY)
CD4 % Helper T Cell: 46 % (ref 33–65)
CD4 T Cell Abs: 1491 /uL (ref 400–1790)

## 2019-09-07 NOTE — Telephone Encounter (Signed)
Patient returned call and was instructed to go to the ED for evaluation r/t blood sugar of almost 600.   Maxime Beckner Loyola Mast, RN

## 2019-09-07 NOTE — Telephone Encounter (Signed)
Left voicemail requesting patient call office to discuss lab work.   Lilyannah Zuelke Loyola Mast, RN

## 2019-09-07 NOTE — Telephone Encounter (Signed)
error 

## 2019-09-07 NOTE — Telephone Encounter (Signed)
Received call at 4 am re pt glucose nearly 600.  I have not called pt but she needs to see PCP or go to ER. Does not appear to be DKA but with CO2 of 25

## 2019-09-09 LAB — COMPREHENSIVE METABOLIC PANEL
AG Ratio: 1.2 (calc) (ref 1.0–2.5)
ALT: 10 U/L (ref 6–29)
AST: 9 U/L — ABNORMAL LOW (ref 10–35)
Albumin: 3.9 g/dL (ref 3.6–5.1)
Alkaline phosphatase (APISO): 107 U/L (ref 37–153)
BUN/Creatinine Ratio: 17 (calc) (ref 6–22)
BUN: 20 mg/dL (ref 7–25)
CO2: 25 mmol/L (ref 20–32)
Calcium: 9.6 mg/dL (ref 8.6–10.4)
Chloride: 96 mmol/L — ABNORMAL LOW (ref 98–110)
Creat: 1.19 mg/dL — ABNORMAL HIGH (ref 0.50–1.05)
Globulin: 3.2 g/dL (calc) (ref 1.9–3.7)
Glucose, Bld: 583 mg/dL (ref 65–99)
Potassium: 4.1 mmol/L (ref 3.5–5.3)
Sodium: 131 mmol/L — ABNORMAL LOW (ref 135–146)
Total Bilirubin: 0.4 mg/dL (ref 0.2–1.2)
Total Protein: 7.1 g/dL (ref 6.1–8.1)

## 2019-09-09 LAB — LIPID PANEL
Cholesterol: 208 mg/dL — ABNORMAL HIGH (ref <200)
HDL: 42 mg/dL — ABNORMAL LOW (ref >=50)
LDL Cholesterol (Calc): 127 mg/dL (calc) — ABNORMAL HIGH
Non-HDL Cholesterol (Calc): 166 mg/dL (calc) — ABNORMAL HIGH (ref <130)
Total CHOL/HDL Ratio: 5 (calc) — ABNORMAL HIGH (ref <5.0)
Triglycerides: 244 mg/dL — ABNORMAL HIGH (ref <150)

## 2019-09-09 LAB — CBC
HCT: 38 % (ref 35.0–45.0)
Hemoglobin: 11.2 g/dL — ABNORMAL LOW (ref 11.7–15.5)
MCH: 21.1 pg — ABNORMAL LOW (ref 27.0–33.0)
MCHC: 29.5 g/dL — ABNORMAL LOW (ref 32.0–36.0)
MCV: 71.4 fL — ABNORMAL LOW (ref 80.0–100.0)
MPV: 10.8 fL (ref 7.5–12.5)
Platelets: 331 10*3/uL (ref 140–400)
RBC: 5.32 10*6/uL — ABNORMAL HIGH (ref 3.80–5.10)
RDW: 14.2 % (ref 11.0–15.0)
WBC: 7.2 10*3/uL (ref 3.8–10.8)

## 2019-09-09 LAB — HIV-1 RNA QUANT-NO REFLEX-BLD
HIV 1 RNA Quant: <20 copies/mL
HIV-1 RNA Quant, Log: <1.3 Log copies/mL

## 2019-09-09 LAB — RPR: RPR Ser Ql: NONREACTIVE

## 2019-09-09 NOTE — Telephone Encounter (Signed)
Patient called today to see if she could get a note for work. She did not go to the ER as directed because she did not have anyone to watch her grandchildren.  She has someone to watch them tomorrow through Sunday, and plans to go to the ER then.  She is asking for a note to keep her out of work until then. She states she stopped checking her blood sugars because her grandson plays with the equipment and got a needle stick (he is following at [facility] for this). She states she did what she thought was best and took "2 doses of insulin" to get her numbers under control, but cannot be certain it worked since she no longer has her meter. Patient did not follow up with her primary care, even though she was there today for a covid vaccine.  RN implored patient to please let her primary care know about this and to see if they can tell her how best to manage it at this time.  She will not answer this, but asks for a letter for work to be emailed or texted to her.  RN sent a mychart code to her phone. RN confirmed patient's primary care office, left message for triage nurse notifying them and asking for assistance in managing patient's sugars. Please advise on letter for work. Andree Coss, RN

## 2019-09-09 NOTE — Telephone Encounter (Signed)
Patient called front desk to let Dr Ninetta Lights know that she is currently at the ED.

## 2019-09-10 ENCOUNTER — Encounter: Payer: Medicaid Other | Admitting: Infectious Diseases

## 2019-09-15 ENCOUNTER — Other Ambulatory Visit: Payer: Self-pay | Admitting: Infectious Diseases

## 2019-09-15 DIAGNOSIS — B20 Human immunodeficiency virus [HIV] disease: Secondary | ICD-10-CM

## 2019-09-23 ENCOUNTER — Ambulatory Visit: Payer: Medicaid Other | Admitting: Infectious Diseases

## 2019-09-24 ENCOUNTER — Telehealth: Payer: Self-pay

## 2019-09-24 NOTE — Telephone Encounter (Signed)
Patient called office requesting office visits be transitioned into e-visits to accommodate her schedule. States she's juggling a lot right now and can't make her appointments consistently. Explained why she's seeing Cassie in the coming weeks and our "no show" policy. Patient requested visit with Cassie become an evisit since she has work.   Ashley Vaughn Loyola Mast, RN

## 2019-09-24 NOTE — Telephone Encounter (Signed)
Yes, that is fine with me. She had labs done last month so no problem!

## 2019-09-30 ENCOUNTER — Telehealth (INDEPENDENT_AMBULATORY_CARE_PROVIDER_SITE_OTHER): Payer: Medicaid Other | Admitting: Pharmacist

## 2019-09-30 ENCOUNTER — Other Ambulatory Visit: Payer: Self-pay

## 2019-09-30 DIAGNOSIS — E1140 Type 2 diabetes mellitus with diabetic neuropathy, unspecified: Secondary | ICD-10-CM

## 2019-09-30 DIAGNOSIS — E1165 Type 2 diabetes mellitus with hyperglycemia: Secondary | ICD-10-CM

## 2019-09-30 DIAGNOSIS — IMO0002 Reserved for concepts with insufficient information to code with codable children: Secondary | ICD-10-CM

## 2019-09-30 DIAGNOSIS — B20 Human immunodeficiency virus [HIV] disease: Secondary | ICD-10-CM

## 2019-09-30 MED ORDER — ACCU-CHEK GUIDE VI STRP: ORAL_STRIP | 12 refills | Status: DC

## 2019-09-30 MED ORDER — ACCU-CHEK GUIDE ME W/DEVICE KIT: 1.0000 | PACK | Freq: Every day | 0 refills | Status: DC

## 2019-09-30 MED ORDER — ODEFSEY 200-25-25 MG PO TABS: 1.0000 | ORAL_TABLET | Freq: Every day | ORAL | 11 refills | Status: DC

## 2019-09-30 NOTE — Progress Notes (Signed)
Virtual Visit via Telephone Note  I connected with Ashley Vaughn on 09/30/19 at  3:15 PM EDT by telephone and verified that I am speaking with the correct person using two identifiers.  Location: Patient: home Provider: office   I discussed the limitations, risks, security and privacy concerns of performing an evaluation and management service by telephone and the availability of in person appointments. I also discussed with the patient that there may be a patient responsible charge related to this service. The patient expressed understanding and agreed to proceed.   HPI: Ashley Vaughn is a 54 y.o. female who presents to the [facility] for HIV follow-up.  Patient Active Problem List   Diagnosis Date Noted  . HSV (herpes simplex virus) anogenital infection 03/12/2019  . Bronchiolitis 09/10/2018  . Cloudy urine 02/25/2018  . Dyspareunia, female 02/25/2018  . Screening examination for venereal disease 03/03/2014  . Encounter for long-term (current) use of medications 03/03/2014  . Health care maintenance 03/18/2012  . Major depressive disorder, single episode, severe ([place]) 01/05/2010  . Human immunodeficiency virus (HIV) disease ([place]) 10/13/2008  . 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  . Uncontrolled type 2 diabetes with neuropathy ([place]) 10/29/1999    Patient's Medications  New Prescriptions   BLOOD GLUCOSE MONITORING SUPPL (ACCU-CHEK GUIDE ME) W/DEVICE KIT    1 kit by Does not apply route daily.  Previous Medications   ACCU-CHEK FASTCLIX LANCETS MISC    1 each by Does not apply route 1 day or 1 dose. Check blood sugar 4x a day before meals and bedtime  2 days each week. Dx code- 250.00.   ASPIRIN 81 MG EC TABLET    Take 81 mg by mouth daily.     B-D UF III MINI PEN NEEDLES 31G X 5 MM MISC    UTD ONCE DAILY WITH LANTUS   BLOOD GLUCOSE MONITORING SUPPL (ACCU-CHEK NANO SMARTVIEW) W/DEVICE KIT    1 each by Does not apply route 2 (two) times daily. Check blood sugar 4x a day before meals and  bedtime  2 days each week. Dx code- 250.00.   CHOLECALCIFEROL (VITAMIN D3) 5000 UNITS TABS    Take 5,000 Units by mouth daily.   DULOXETINE (CYMBALTA) 30 MG CAPSULE    Take 1 capsule (30 mg total) by mouth 2 (two) times daily.   GLIPIZIDE XL 10 MG 24 HR TABLET    TAKE 2 TABLETS BY MOUTH DAILY   INSULIN GLARGINE (LANTUS SOLOSTAR) 100 UNIT/ML SOLOSTAR PEN    Inject 30 Units into the skin daily.    LOSARTAN (COZAAR) 25 MG TABLET    Take 1 tablet (25 mg total) by mouth daily.   NAPROXEN (NAPROSYN) 500 MG TABLET    Take 500 mg by mouth 2 (two) times daily with a meal.   SITAGLIPTIN-METFORMIN (JANUMET) 50-500 MG TABLET    Take 1 tablet by mouth 2 (two) times daily with a meal.  Modified Medications   Modified Medication Previous Medication   EMTRICITABINE-RILPIVIR-TENOFOVIR AF (ODEFSEY) 200-25-25 MG TABS TABLET ODEFSEY 200-25-25 MG TABS tablet      Take 1 tablet by mouth daily with breakfast.    TAKE 1 TABLET BY MOUTH DAILY WITH BREAKFAST   GLUCOSE BLOOD (ACCU-CHEK GUIDE) TEST STRIP glucose blood (ACCU-CHEK GUIDE) test strip      Use as instructed    Use as instructed  Discontinued Medications   No medications on file    Allergies: Allergies  Allergen Reactions  . Fluorescein Itching and Rash  INTRAVENOUSLY    Past Medical History: Past Medical History:  Diagnosis Date  . Depression   . Diabetes mellitus   . History of positive PPD, treatment status unknown   . HIV infection [facility])     Social History: Social History   Socioeconomic History  . Marital status: Legally Separated    Spouse name: Not on file  . Number of children: Not on file  . Years of education: Not on file  . Highest education level: Not on file  Occupational History  . Not on file  Tobacco Use  . Smoking status: Never Smoker  . Smokeless tobacco: Never Used  Vaping Use  . Vaping Use: Never used  Substance and Sexual Activity  . Alcohol use: Yes    Alcohol/week: 0.0 standard drinks    Comment:  occasional  . Drug use: No    Comment: h/o cocaine abuse, clean since 2009  . Sexual activity: Never    Partners: Male    Comment: pt. declined condoms  Other Topics Concern  . Not on file  Social History Narrative  . Not on file   Social Determinants of Health   Financial Resource Strain:   . Difficulty of Paying Living Expenses:   Food Insecurity:   . Worried About Charity fundraiser in the Last Year:   . Arboriculturist in the Last Year:   Transportation Needs:   . Film/video editor (Medical):   Marland Kitchen Lack of Transportation (Non-Medical):   Physical Activity:   . Days of Exercise per Week:   . Minutes of Exercise per Session:   Stress:   . Feeling of Stress :   Social Connections:   . Frequency of Communication with Friends and Family:   . Frequency of Social Gatherings with Friends and Family:   . Attends Religious Services:   . Active Member of Clubs or Organizations:   . Attends Archivist Meetings:   Marland Kitchen Marital Status:     Labs: Lab Results  Component Value Date   HIV1RNAQUANT <20 NOT DETECTED 09/06/2019   HIV1RNAQUANT <20 NOT DETECTED 03/12/2019   HIV1RNAQUANT <20 DETECTED (A) 09/10/2018   CD4TABS 1,491 09/06/2019   CD4TABS 1,208 03/12/2019   CD4TABS 1,350 02/25/2018    RPR and STI Lab Results  Component Value Date   LABRPR NON-REACTIVE 09/06/2019   LABRPR NON-REACTIVE 03/12/2019   LABRPR NON-REACTIVE 09/10/2018   LABRPR NON-REACTIVE 02/25/2018   LABRPR NON-REACTIVE 02/03/2017    STI Results GC CT  02/25/2018 Negative Negative  05/16/2017 Negative Negative  05/16/2017 Negative Negative  02/03/2017 Negative Negative  07/10/2015 Negative Negative  11/22/2014 Negative Negative    Hepatitis B Lab Results  Component Value Date   HEPBSAB NONREACTIVE 04/08/2012   HEPBSAG NEG 10/13/2008   HEPBCAB NEG 10/13/2008   Hepatitis C No results found for: HEPCAB, HCVRNAPCRQN Hepatitis A Lab Results  Component Value Date   HAV NEG 10/13/2008    Lipids: Lab Results  Component Value Date   CHOL 208 (H) 09/06/2019   TRIG 244 (H) 09/06/2019   HDL 42 (L) 09/06/2019   CHOLHDL 5.0 (H) 09/06/2019   VLDL 34 (H) 07/31/2016   LDLCALC 127 (H) 09/06/2019    Current HIV Regimen: Odefsey  Assessment: I connected with Ashley Vaughn today via telephone. She came in a few weeks ago and had labs drawn that showed an undetectable HIV viral load and a healthy CD4 count.  She states she is doing ok but misses a few  doses of her Odefsey every now and then. She does take it with food like she is supposed to. No issues there. She has missed several appointments with Dr. Johnnye Sima and was last seen in January.  Her blood glucose was 583 when we checked labs. She states that sometimes it hangs out in the 500s and she gets dizzy. She has not followed up with her PCP in awhile, and I encouraged her to call them and get seen soon. She says she is taking her insulin every day and trying to take her Glipizide and Janumet as well. She doesn't have a meter and says that her PCP will not give her one because her grandson got a hold of her last one and pricked his finger.  I agreed to send her in a meter (I hope it is the right one) but stated that if it isn't the right one or she needs another one that she will need to reach out to her PCP.  She agreed.   She also stated that she has been told that she will eventually need dialysis most likely.  I told her that we checked her kidney function and it was ok at this time, but that the diabetes will continue to make it worse if it remains uncontrolled.  He cholesterol is also high, so she needs to follow up with her PCP regarding that as well.  Encouraged her to continue doing a great job with her [place]. She is still very stressed with a small house, trying to work, and raising 4 grandkids. She is complaining that [facility] never helped her with her housing.  Will refill her medications and schedule a follow up with Dr. Johnnye Sima for 6  months.  Plan: - Continue Odefsey with food - Resend previous meter that she was using - F/u with Dr. Johnnye Sima in 6 months  I discussed the assessment and treatment plan with the patient. The patient was provided an opportunity to ask questions and all were answered. The patient agreed with the plan and demonstrated an understanding of the instructions.   The patient was advised to call back or seek an in-person evaluation if the symptoms worsen or if the condition fails to improve as anticipated.  I provided 25 minutes of non-face-to-face time during this encounter.  Endi Lagman L. Callan Norden, PharmD, BCIDP, AAHIVP, CPP Clinical Pharmacist Practitioner [facility] 09/30/2019, 3:45 PM

## 2019-10-07 ENCOUNTER — Ambulatory Visit: Payer: Medicaid Other | Admitting: Infectious Diseases

## 2020-02-25 ENCOUNTER — Other Ambulatory Visit: Payer: Self-pay

## 2020-02-25 ENCOUNTER — Ambulatory Visit (INDEPENDENT_AMBULATORY_CARE_PROVIDER_SITE_OTHER): Payer: Medicaid Other | Admitting: Infectious Diseases

## 2020-02-25 ENCOUNTER — Other Ambulatory Visit (HOSPITAL_COMMUNITY)
Admission: RE | Admit: 2020-02-25 | Discharge: 2020-02-25 | Disposition: A | Discharge status: Home / Self Care | Payer: Medicaid Other | Source: Ambulatory Visit | Attending: Infectious Diseases | Admitting: Infectious Diseases

## 2020-02-25 ENCOUNTER — Encounter: Payer: Self-pay | Admitting: Infectious Diseases

## 2020-02-25 VITALS — Ht 62.0 in | Wt 160.0 lb

## 2020-02-25 DIAGNOSIS — E1165 Type 2 diabetes mellitus with hyperglycemia: Secondary | ICD-10-CM | POA: Diagnosis not present

## 2020-02-25 DIAGNOSIS — Z1231 Encounter for screening mammogram for malignant neoplasm of breast: Secondary | ICD-10-CM | POA: Diagnosis not present

## 2020-02-25 DIAGNOSIS — Z79899 Other long term (current) drug therapy: Secondary | ICD-10-CM | POA: Diagnosis not present

## 2020-02-25 DIAGNOSIS — B20 Human immunodeficiency virus [HIV] disease: Secondary | ICD-10-CM | POA: Diagnosis present

## 2020-02-25 DIAGNOSIS — Z113 Encounter for screening for infections with a predominantly sexual mode of transmission: Secondary | ICD-10-CM | POA: Diagnosis not present

## 2020-02-25 DIAGNOSIS — F322 Major depressive disorder, single episode, severe without psychotic features: Secondary | ICD-10-CM

## 2020-02-25 DIAGNOSIS — E1140 Type 2 diabetes mellitus with diabetic neuropathy, unspecified: Secondary | ICD-10-CM

## 2020-02-25 DIAGNOSIS — Z1211 Encounter for screening for malignant neoplasm of colon: Secondary | ICD-10-CM | POA: Diagnosis not present

## 2020-02-25 DIAGNOSIS — IMO0002 Reserved for concepts with insufficient information to code with codable children: Secondary | ICD-10-CM

## 2020-02-25 NOTE — Assessment & Plan Note (Signed)
Continues to have issues with mood.  Will have Marylu Lund connect with her.  She is not taking cymbalta (or lexapro).

## 2020-02-25 NOTE — Assessment & Plan Note (Addendum)
Appears to be doing well Offered/refused condoms.  Needs COVID vax #2 Will continue her odefsy,  Also try to get her scheduled for mammo, pap Will see her back in 6 months with labs prior.

## 2020-02-25 NOTE — Progress Notes (Addendum)
Subjective:    Patient ID: Ashley Vaughn, female  DOB: 1965/12/22, 55 y.o.        MRN: [id number]   HPI 54yo F with hx of DM2 (dx 2002, on insulin) and HIV+(dx Oct 2010).On complera 12-2010, changed to odefsy 02-2015.Taking with food. Prev eval at mental health. Has been feeling uneasy- trouble concentrating, remembering.   Still rasing grandkids. Daughter is out of jail but back to using drugs.   She was seen in ED this summer for Glc > 700. She has not been to PCP for some time.   Was dx with COVID in November 2020.    A1C1-2020>14%.  Had laser eye(L cataract)surgery April 2019.No further ophtho f/u. R eye still has cataract.  Last mammo- 2013? Last PAP (prev hysterectomy)- ? No hx of colonoscopy   Feels ok today- sugars still running a little bit high. Has numbness in her 3 toes on R foot. Taking her insulin irregularly.  No problems with odefsy.  Had vaginal yeast infection. Had flucon which resolved. Afraid if her partner has been messing around.  HIV 1 RNA Quant (copies/mL)  Date Value  09/06/2019 <20 NOT DETECTED  03/12/2019 <20 NOT DETECTED  09/10/2018 <20 DETECTED (A)   CD4 T Cell Abs (/uL)  Date Value  09/06/2019 1,491  03/12/2019 1,208  02/25/2018 1,350     Health Maintenance  Topic Date Due  . OPHTHALMOLOGY EXAM  Never done  . COVID-19 Vaccine (1) Never done  . COLONOSCOPY (Pts 45-58yrs Insurance coverage will need to be confirmed)  Never done  . MAMMOGRAM  02/26/2012  . PAP SMEAR-Modifier  01/21/2013  . FOOT EXAM  05/06/2018  . HEMOGLOBIN A1C  06/10/2019  . INFLUENZA VACCINE  09/19/2019  . LIPID PANEL  09/05/2020  . TETANUS/TDAP  06/30/2021  . PNEUMOCOCCAL POLYSACCHARIDE VACCINE AGE 62-64 HIGH RISK  Completed  . Hepatitis C Screening  Completed  . HIV Screening  Completed   A1C (02-2019) > 14%   Review of Systems  Constitutional: Negative for chills, fever and weight loss.  Eyes: Positive for blurred vision.  Respiratory: Negative for  cough and shortness of breath.   Genitourinary: Negative for dysuria.  Neurological: Positive for sensory change.   Please see HPI. All other systems reviewed and negative.     Objective:  Physical Exam Vitals reviewed.  Constitutional:      General: She is not in acute distress.    Appearance: Normal appearance. She is not toxic-appearing.  HENT:     Mouth/Throat:     Mouth: Mucous membranes are moist.     Pharynx: No oropharyngeal exudate.  Eyes:     Extraocular Movements: Extraocular movements intact.     Pupils: Pupils are equal, round, and reactive to light.  Cardiovascular:     Rate and Rhythm: Normal rate and regular rhythm.     Pulses:          Dorsalis pedis pulses are 3+ on the right side and 3+ on the left side.  Pulmonary:     Effort: Pulmonary effort is normal. No respiratory distress.     Breath sounds: Normal breath sounds.  Abdominal:     General: Bowel sounds are normal. There is no distension.     Palpations: Abdomen is soft.     Tenderness: There is no abdominal tenderness.  Musculoskeletal:     Cervical back: Normal range of motion and neck supple.     Right lower leg: No edema.  Left lower leg: No edema.     Right foot: Normal range of motion. No deformity, bunion, Charcot foot, foot drop or prominent metatarsal heads.     Left foot: Normal range of motion. No deformity, bunion, Charcot foot, foot drop or prominent metatarsal heads.  Feet:     Right foot:     Protective Sensation: 2 sites tested. 2 sites sensed.     Skin integrity: Skin integrity normal. No ulcer.     Toenail Condition: Right toenails are normal.     Left foot:     Protective Sensation: 2 sites tested. 2 sites sensed.     Skin integrity: Skin integrity normal. No ulcer or skin breakdown.     Toenail Condition: Left toenails are normal.  Neurological:     General: No focal deficit present.     Mental Status: She is alert.     Cranial Nerves: No cranial nerve deficit.      Sensory: No sensory deficit.  Psychiatric:        Mood and Affect: Mood normal.            Assessment & Plan:

## 2020-02-25 NOTE — Addendum Note (Signed)
Addended by: Valton Schwartz C on: 02/25/2020 11:24 AM   Modules accepted: Orders

## 2020-02-25 NOTE — Assessment & Plan Note (Signed)
She has called [facility] for f/u appt.  Will send her for eye exam Try to get her in for colon.

## 2020-02-28 LAB — URINE CYTOLOGY ANCILLARY ONLY
Chlamydia: NEGATIVE
Comment: NEGATIVE
Comment: NORMAL
Neisseria Gonorrhea: NEGATIVE

## 2020-03-03 LAB — COMPLETE METABOLIC PANEL WITH GFR
AG Ratio: 1.2 (calc) (ref 1.0–2.5)
ALT: 9 U/L (ref 6–29)
AST: 9 U/L — ABNORMAL LOW (ref 10–35)
Albumin: 3.8 g/dL (ref 3.6–5.1)
Alkaline phosphatase (APISO): 84 U/L (ref 37–153)
BUN/Creatinine Ratio: 18 (calc) (ref 6–22)
BUN: 22 mg/dL (ref 7–25)
CO2: 24 mmol/L (ref 20–32)
Calcium: 9.2 mg/dL (ref 8.6–10.4)
Chloride: 100 mmol/L (ref 98–110)
Creat: 1.24 mg/dL — ABNORMAL HIGH (ref 0.50–1.05)
GFR, Est African American: 57 mL/min/{1.73_m2} — ABNORMAL LOW (ref >=60)
GFR, Est Non African American: 50 mL/min/{1.73_m2} — ABNORMAL LOW (ref >=60)
Globulin: 3.3 g/dL (calc) (ref 1.9–3.7)
Glucose, Bld: 348 mg/dL — ABNORMAL HIGH (ref 65–99)
Potassium: 4.1 mmol/L (ref 3.5–5.3)
Sodium: 135 mmol/L (ref 135–146)
Total Bilirubin: 0.3 mg/dL (ref 0.2–1.2)
Total Protein: 7.1 g/dL (ref 6.1–8.1)

## 2020-03-03 LAB — T-HELPER CELLS (CD4) COUNT (NOT AT ARMC)
Absolute CD4: 1492 cells/uL (ref 490–1740)
CD4 T Helper %: 42 % (ref 30–61)
Total lymphocyte count: 3534 cells/uL (ref 850–3900)

## 2020-03-03 LAB — CBC WITH DIFFERENTIAL/PLATELET
Absolute Monocytes: 341 cells/uL (ref 200–950)
Basophils Absolute: 11 cells/uL (ref 0–200)
Basophils Relative: 0.2 %
Eosinophils Absolute: 39 cells/uL (ref 15–500)
Eosinophils Relative: 0.7 %
HCT: 37.5 % (ref 35.0–45.0)
Hemoglobin: 11.5 g/dL — ABNORMAL LOW (ref 11.7–15.5)
Lymphs Abs: 2976 cells/uL (ref 850–3900)
MCH: 21.9 pg — ABNORMAL LOW (ref 27.0–33.0)
MCHC: 30.7 g/dL — ABNORMAL LOW (ref 32.0–36.0)
MCV: 71.3 fL — ABNORMAL LOW (ref 80.0–100.0)
MPV: 10.2 fL (ref 7.5–12.5)
Monocytes Relative: 6.2 %
Neutro Abs: 2134 cells/uL (ref 1500–7800)
Neutrophils Relative %: 38.8 %
Platelets: 341 10*3/uL (ref 140–400)
RBC: 5.26 10*6/uL — ABNORMAL HIGH (ref 3.80–5.10)
RDW: 14.5 % (ref 11.0–15.0)
Total Lymphocyte: 54.1 %
WBC: 5.5 10*3/uL (ref 3.8–10.8)

## 2020-03-03 LAB — HIV-1 RNA QUANT-NO REFLEX-BLD
HIV 1 RNA Quant: <20 Copies/mL
HIV-1 RNA Quant, Log: <1.3 Log cps/mL

## 2020-03-17 ENCOUNTER — Encounter: Payer: Self-pay | Admitting: Gastroenterology

## 2020-03-20 ENCOUNTER — Telehealth: Payer: Self-pay

## 2020-03-20 NOTE — Telephone Encounter (Signed)
Patient is calling to see if Dr Ninetta Lights can refill her glucose strips.  The medical records indicate her primary care provider is the one to contact for glucose supplies.   She also asked about her HIV labs   She was informed her labs look great and her viral load is suppressed. Congratulated  her on the great job.   Laurell Josephs , RN

## 2020-03-31 ENCOUNTER — Ambulatory Visit: Payer: Medicaid Other | Admitting: Gastroenterology

## 2020-06-08 ENCOUNTER — Telehealth: Payer: Self-pay

## 2020-06-08 NOTE — Telephone Encounter (Signed)
Patient called office today stating she is having issues getting medication from [facility]. Was told by pharmacy that she has no refills on file for her medication and will need to contact her provider.  Per chart patient received year supply of Odefsey in  November. Advised if she needs refills on her other medication she will need to contact PCP.  Patient will follow up with primary team and call office if needed.

## 2020-08-16 ENCOUNTER — Other Ambulatory Visit: Payer: Self-pay | Admitting: Family

## 2020-08-16 DIAGNOSIS — B20 Human immunodeficiency virus [HIV] disease: Secondary | ICD-10-CM

## 2020-08-25 ENCOUNTER — Ambulatory Visit: Payer: Medicaid Other | Admitting: Infectious Diseases

## 2020-08-29 ENCOUNTER — Other Ambulatory Visit: Payer: Self-pay

## 2020-08-29 ENCOUNTER — Encounter: Payer: Self-pay | Admitting: Infectious Diseases

## 2020-08-29 ENCOUNTER — Ambulatory Visit (INDEPENDENT_AMBULATORY_CARE_PROVIDER_SITE_OTHER): Payer: Medicaid Other | Admitting: Infectious Diseases

## 2020-08-29 VITALS — BP 144/87 | HR 73 | Temp 97.8°F | Wt 154.0 lb

## 2020-08-29 DIAGNOSIS — B20 Human immunodeficiency virus [HIV] disease: Secondary | ICD-10-CM

## 2020-08-29 DIAGNOSIS — E1165 Type 2 diabetes mellitus with hyperglycemia: Secondary | ICD-10-CM | POA: Diagnosis not present

## 2020-08-29 DIAGNOSIS — Z79899 Other long term (current) drug therapy: Secondary | ICD-10-CM | POA: Diagnosis not present

## 2020-08-29 DIAGNOSIS — E1140 Type 2 diabetes mellitus with diabetic neuropathy, unspecified: Secondary | ICD-10-CM

## 2020-08-29 DIAGNOSIS — IMO0002 Reserved for concepts with insufficient information to code with codable children: Secondary | ICD-10-CM

## 2020-08-29 DIAGNOSIS — Z113 Encounter for screening for infections with a predominantly sexual mode of transmission: Secondary | ICD-10-CM | POA: Diagnosis present

## 2020-08-29 NOTE — Progress Notes (Signed)
Subjective:    Patient ID: Ashley Vaughn, female  DOB: 1965/12/24, 55 y.o.        MRN: [id number]   HPI 55 yo F with hx of DM2 (dx 2002, on insulin) and HIV+ (dx Oct 2010). On complera 12-2010, changed to odefsy 02-2015. Taking with food.  Feels like her concentration is better. Has gotten back to work.  Still rasing grandkids, "I think I'm getting ready to release them (back to social services)". Daughter is doing ok, has not been by in about a month.   Has not seen PCP for her DM recently. She promises to make appt.    HIV 1 RNA Quant  Date Value  02/25/2020 <20 Copies/mL  09/06/2019 <20 NOT DETECTED copies/mL  03/12/2019 <20 NOT DETECTED copies/mL   CD4 T Cell Abs (/uL)  Date Value  09/06/2019 1,491  03/12/2019 1,208  02/25/2018 1,350     Health Maintenance  Topic Date Due  . OPHTHALMOLOGY EXAM  Never done  . Zoster Vaccines- Shingrix (1 of 2) Never done  . COLONOSCOPY (Pts 45-70yrs Insurance coverage will need to be confirmed)  Never done  . MAMMOGRAM  02/26/2012  . PAP SMEAR-Modifier  01/21/2013  . FOOT EXAM  05/06/2018  . HEMOGLOBIN A1C  06/10/2019  . COVID-19 Vaccine (2 - Moderna risk series) 01/17/2020  . LIPID PANEL  09/05/2020  . INFLUENZA VACCINE  09/18/2020  . TETANUS/TDAP  06/30/2021  . Pneumococcal Vaccine 52-19 Years old (4 - PPSV23 or PCV20) 05/30/2031  . PNEUMOCOCCAL POLYSACCHARIDE VACCINE AGE 40-64 HIGH RISK  Completed  . Hepatitis C Screening  Completed  . HIV Screening  Completed  . HPV VACCINES  Aged Out      Review of Systems  Constitutional:  Negative for chills, fever and weight loss.  Respiratory:  Negative for cough and shortness of breath.   Gastrointestinal:  Negative for constipation and diarrhea.  Genitourinary:  Negative for dysuria.  Neurological:  Positive for sensory change.   Please see HPI. All other systems reviewed and negative.     Objective:  Physical Exam Vitals reviewed.  Constitutional:      Appearance: Normal  appearance.  HENT:     Mouth/Throat:     Mouth: Mucous membranes are moist.     Pharynx: No oropharyngeal exudate.  Eyes:     Extraocular Movements: Extraocular movements intact.     Pupils: Pupils are equal, round, and reactive to light.  Cardiovascular:     Rate and Rhythm: Normal rate and regular rhythm.     Pulses:          Posterior tibial pulses are 1+ on the right side and 1+ on the left side.  Pulmonary:     Effort: Pulmonary effort is normal.     Breath sounds: Normal breath sounds.  Abdominal:     General: Bowel sounds are normal. There is no distension.     Palpations: Abdomen is soft.     Tenderness: There is no abdominal tenderness.  Musculoskeletal:     Cervical back: Normal range of motion and neck supple.     Right lower leg: No edema.     Left lower leg: No edema.     Right foot: No deformity, bunion or Charcot foot.     Left foot: No deformity or bunion.  Feet:     Right foot:     Protective Sensation:  1 site tested.  1 site sensed.    Skin integrity: Skin integrity  normal. No ulcer.     Toenail Condition: Right toenails are normal.     Left foot:     Protective Sensation:  1 site tested.  1 site sensed.    Skin integrity: Skin integrity normal. No ulcer.     Toenail Condition: Left toenails are normal.  Skin:    Findings: Lesion (multiple small nodules on feet. she attributes to ant bites.) present.  Neurological:     General: No focal deficit present.     Mental Status: She is alert.  Psychiatric:        Mood and Affect: Mood normal.          Assessment & Plan:

## 2020-08-29 NOTE — Assessment & Plan Note (Addendum)
She appears to be doing well Will check her labs today Continue odefsy Get mammo, pap.  Offered/refused condoms.   rtc in 6 months

## 2020-08-29 NOTE — Addendum Note (Signed)
Addended by: Harley Alto on: 08/29/2020 02:41 PM   Modules accepted: Orders

## 2020-08-29 NOTE — Assessment & Plan Note (Signed)
Will get her in with IMTS.  Check A1C today

## 2020-09-01 ENCOUNTER — Telehealth: Payer: Self-pay

## 2020-09-01 NOTE — Telephone Encounter (Signed)
Attempted to reach out to patient to schedule for labs. Labs were to be done prior to patient leaving for the office.   Staff also updated [facility] case Production designer, theatre/television/film.  [name]

## 2020-09-01 NOTE — Telephone Encounter (Signed)
Patient returned call, states her Ashley Vaughn ride came early so she had to leave. Transferred call to scheduling for assistance with lab appointment and transportation  [facility]

## 2020-09-05 ENCOUNTER — Other Ambulatory Visit: Payer: Medicaid Other

## 2020-09-06 ENCOUNTER — Encounter: Payer: Medicaid Other | Admitting: Internal Medicine

## 2020-09-08 ENCOUNTER — Other Ambulatory Visit: Payer: Medicaid Other

## 2020-09-08 ENCOUNTER — Other Ambulatory Visit: Payer: Self-pay | Admitting: Infectious Diseases

## 2020-09-08 ENCOUNTER — Other Ambulatory Visit: Payer: Self-pay

## 2020-09-08 ENCOUNTER — Other Ambulatory Visit (HOSPITAL_COMMUNITY)
Admission: RE | Admit: 2020-09-08 | Discharge: 2020-09-08 | Disposition: A | Discharge status: Home / Self Care | Payer: Medicaid Other | Source: Ambulatory Visit | Attending: Infectious Diseases | Admitting: Infectious Diseases

## 2020-09-08 DIAGNOSIS — IMO0002 Reserved for concepts with insufficient information to code with codable children: Secondary | ICD-10-CM

## 2020-09-08 DIAGNOSIS — Z113 Encounter for screening for infections with a predominantly sexual mode of transmission: Secondary | ICD-10-CM

## 2020-09-08 DIAGNOSIS — B20 Human immunodeficiency virus [HIV] disease: Secondary | ICD-10-CM

## 2020-09-08 DIAGNOSIS — E1140 Type 2 diabetes mellitus with diabetic neuropathy, unspecified: Secondary | ICD-10-CM

## 2020-09-11 ENCOUNTER — Telehealth: Payer: Self-pay

## 2020-09-11 DIAGNOSIS — E1140 Type 2 diabetes mellitus with diabetic neuropathy, unspecified: Secondary | ICD-10-CM

## 2020-09-11 DIAGNOSIS — IMO0002 Reserved for concepts with insufficient information to code with codable children: Secondary | ICD-10-CM

## 2020-09-11 LAB — URINE CYTOLOGY ANCILLARY ONLY
Chlamydia: NEGATIVE
Comment: NEGATIVE
Comment: NORMAL
Neisseria Gonorrhea: NEGATIVE

## 2020-09-11 NOTE — Telephone Encounter (Signed)
Spoke with patient to relay result of blood glucose over 400. Patient states "I know my sugar is high." She denies having a PCP as she did not like the one she had previously, attempted to give her phone numbers for [facility]. Patient unable to take the number at this time. Attempted to reset her MyChart password so that she could access, patient states she is unable to do that right now and says she will call back later to get the phone numbers for primary care.   Sandie Ano, RN

## 2020-09-11 NOTE — Telephone Encounter (Signed)
-----   Message from Randall Hiss, MD sent at 09/11/2020  8:16 AM EDT ----- Regarding: does she have PCP her BG was over 400  ----- Message ----- From: Janace Hoard Lab Results In Sent: 09/08/2020  10:54 PM EDT To: Randall Hiss, MD

## 2020-09-12 LAB — T-HELPER CELLS (CD4) COUNT (NOT AT ARMC)
Absolute CD4: 1425 cells/uL (ref 490–1740)
CD4 T Helper %: 47 % (ref 30–61)
Total lymphocyte count: 3039 cells/uL (ref 850–3900)

## 2020-09-12 LAB — CBC
HCT: 38.8 % (ref 35.0–45.0)
Hemoglobin: 11.3 g/dL — ABNORMAL LOW (ref 11.7–15.5)
MCH: 21 pg — ABNORMAL LOW (ref 27.0–33.0)
MCHC: 29.1 g/dL — ABNORMAL LOW (ref 32.0–36.0)
MCV: 72.1 fL — ABNORMAL LOW (ref 80.0–100.0)
MPV: 10.9 fL (ref 7.5–12.5)
Platelets: 317 10*3/uL (ref 140–400)
RBC: 5.38 10*6/uL — ABNORMAL HIGH (ref 3.80–5.10)
RDW: 14.5 % (ref 11.0–15.0)
WBC: 5.5 10*3/uL (ref 3.8–10.8)

## 2020-09-12 LAB — RPR: RPR Ser Ql: NONREACTIVE

## 2020-09-12 LAB — COMPREHENSIVE METABOLIC PANEL
AG Ratio: 1.2 (calc) (ref 1.0–2.5)
ALT: 8 U/L (ref 6–29)
AST: 8 U/L — ABNORMAL LOW (ref 10–35)
Albumin: 3.7 g/dL (ref 3.6–5.1)
Alkaline phosphatase (APISO): 109 U/L (ref 37–153)
BUN/Creatinine Ratio: 16 (calc) (ref 6–22)
BUN: 18 mg/dL (ref 7–25)
CO2: 24 mmol/L (ref 20–32)
Calcium: 9.4 mg/dL (ref 8.6–10.4)
Chloride: 101 mmol/L (ref 98–110)
Creat: 1.16 mg/dL — ABNORMAL HIGH (ref 0.50–1.03)
Globulin: 3.1 g/dL (calc) (ref 1.9–3.7)
Glucose, Bld: 426 mg/dL — ABNORMAL HIGH (ref 65–99)
Potassium: 4.1 mmol/L (ref 3.5–5.3)
Sodium: 134 mmol/L — ABNORMAL LOW (ref 135–146)
Total Bilirubin: 0.3 mg/dL (ref 0.2–1.2)
Total Protein: 6.8 g/dL (ref 6.1–8.1)

## 2020-09-12 LAB — HEMOGLOBIN A1C: Hgb A1c MFr Bld: >14 % of total Hgb — ABNORMAL HIGH (ref <5.7)

## 2020-09-12 LAB — HIV-1 RNA QUANT-NO REFLEX-BLD
HIV 1 RNA Quant: NOT DETECTED Copies/mL
HIV-1 RNA Quant, Log: NOT DETECTED Log cps/mL

## 2020-10-10 ENCOUNTER — Other Ambulatory Visit: Payer: Self-pay | Admitting: Pharmacist

## 2020-10-10 DIAGNOSIS — IMO0002 Reserved for concepts with insufficient information to code with codable children: Secondary | ICD-10-CM

## 2020-10-10 DIAGNOSIS — E1140 Type 2 diabetes mellitus with diabetic neuropathy, unspecified: Secondary | ICD-10-CM

## 2020-10-10 DIAGNOSIS — B20 Human immunodeficiency virus [HIV] disease: Secondary | ICD-10-CM

## 2020-12-11 ENCOUNTER — Telehealth: Payer: Self-pay

## 2020-12-11 NOTE — Telephone Encounter (Signed)
Reached out to patient regarding missed appointment to establish care with Internal medicine. Staff call [facility] and left voicemail with referral corrdinator with detailed patient information to contact patient to reschedule for diabetes management. Patient also provided with IM phone number via mychart as patient currently did not have a pen to take number at this time. Patient also follow bny [facility] will updated team as well.  [name]

## 2020-12-18 ENCOUNTER — Encounter: Payer: Medicaid Other | Admitting: Student

## 2020-12-21 ENCOUNTER — Telehealth: Payer: Self-pay

## 2020-12-21 ENCOUNTER — Ambulatory Visit (INDEPENDENT_AMBULATORY_CARE_PROVIDER_SITE_OTHER): Payer: Medicaid Other | Admitting: Internal Medicine

## 2020-12-21 VITALS — BP 164/85 | HR 87 | Temp 97.7°F | Ht 62.0 in | Wt 149.9 lb

## 2020-12-21 DIAGNOSIS — Z130 Encounter for screening for diseases of the blood and blood-forming organs and certain disorders involving the immune mechanism: Secondary | ICD-10-CM

## 2020-12-21 DIAGNOSIS — Z794 Long term (current) use of insulin: Secondary | ICD-10-CM

## 2020-12-21 DIAGNOSIS — R5383 Other fatigue: Secondary | ICD-10-CM | POA: Diagnosis not present

## 2020-12-21 DIAGNOSIS — I10 Essential (primary) hypertension: Secondary | ICD-10-CM | POA: Insufficient documentation

## 2020-12-21 DIAGNOSIS — E1140 Type 2 diabetes mellitus with diabetic neuropathy, unspecified: Secondary | ICD-10-CM

## 2020-12-21 DIAGNOSIS — B20 Human immunodeficiency virus [HIV] disease: Secondary | ICD-10-CM | POA: Diagnosis not present

## 2020-12-21 DIAGNOSIS — F322 Major depressive disorder, single episode, severe without psychotic features: Secondary | ICD-10-CM | POA: Diagnosis not present

## 2020-12-21 DIAGNOSIS — Z1329 Encounter for screening for other suspected endocrine disorder: Secondary | ICD-10-CM | POA: Diagnosis not present

## 2020-12-21 HISTORY — DX: Other fatigue: R53.83

## 2020-12-21 LAB — POCT GLYCOSYLATED HEMOGLOBIN (HGB A1C): HbA1c POC (<> result, manual entry): >14 % — AB (ref 4.0–5.6)

## 2020-12-21 LAB — BASIC METABOLIC PANEL
Anion gap: 10 (ref 5–15)
BUN: 17 mg/dL (ref 6–20)
CO2: 25 mmol/L (ref 22–32)
Calcium: 9.4 mg/dL (ref 8.9–10.3)
Chloride: 93 mmol/L — ABNORMAL LOW (ref 98–111)
Creatinine, Ser: 1.3 mg/dL — ABNORMAL HIGH (ref 0.44–1.00)
GFR, Estimated: 49 mL/min — ABNORMAL LOW (ref >60)
Glucose, Bld: 540 mg/dL (ref 70–99)
Potassium: 4.8 mmol/L (ref 3.5–5.1)
Sodium: 128 mmol/L — ABNORMAL LOW (ref 135–145)

## 2020-12-21 LAB — GLUCOSE, CAPILLARY: Glucose-Capillary: 552 mg/dL (ref 70–99)

## 2020-12-21 MED ORDER — SYNJARDY 5-500 MG PO TABS: 1.0000 | ORAL_TABLET | Freq: Two times a day (BID) | ORAL | 0 refills | Status: DC

## 2020-12-21 MED ORDER — LOSARTAN POTASSIUM-HCTZ 50-12.5 MG PO TABS: 1.0000 | ORAL_TABLET | Freq: Every day | ORAL | 11 refills | Status: DC

## 2020-12-21 NOTE — Assessment & Plan Note (Addendum)
Patient endorses several month history of fatigue.  She states that she has been stressed out lately taking care of her 4 grandkids.  She also does not have enough to eat at home, stating that her grandkids eat most of the food that she has at home.  She also craves ice regularly.  -Obtain labs: Anemia profile, BMP, TSH

## 2020-12-21 NOTE — Patient Instructions (Signed)
Thank you, [name] for allowing [place] to provide your care today. Today we discussed starting from scratch with the following new medications:  1) Synjardy: You will take this medication twice daily for your diabetes  2) Hyzaar: You will take this once daily for your blood pressure   Because you are fatigued, we have ordered labs listed below.  Some of these labs are also for your diabetes.  I will call you with these results when they come in. Please follow-up in 1 month for diabetes and blood pressure check.  I have ordered the following labs for you:   Lab Orders         BMP8+Anion Gap         Iron and IBC ([id number])         Anemia Profile B         TSH         Microalbumin / Creatinine Urine Ratio         POC Hbg A1C       Referrals ordered today:   Referral Orders  No referral(s) requested today     I have ordered the following medication/changed the following medications:   Stop the following medications: Medications Discontinued During This Encounter  Medication Reason   losartan (COZAAR) 25 MG tablet Change in therapy   sitaGLIPtin-metformin (JANUMET) 50-500 MG tablet Change in therapy     Start the following medications: Meds ordered this encounter  Medications   Empagliflozin-metFORMIN HCl (SYNJARDY) 5-500 MG TABS    Sig: Take 1 tablet by mouth 2 (two) times daily.    Dispense:  60 tablet    Refill:  0   losartan-hydrochlorothiazide (HYZAAR) 50-12.5 MG tablet    Sig: Take 1 tablet by mouth daily.    Dispense:  30 tablet    Refill:  11     Follow up:  1 month    Should you have any questions or concerns please call the internal medicine clinic at [phone].    Ashley Highland, MD

## 2020-12-21 NOTE — Assessment & Plan Note (Addendum)
The patient presents today for the establishment of care.  Patient was last seen in 2019 in our clinic for diabetes and blood pressure check.  She endorses feeling weak and fatigued over the past few months.  She also endorses sharp pains and tingling in both of her arms and legs.  Patient also states that when she walks it feels like she is "walking on rocks." She overall just "feels bad."  She states that she has been stressed out lately taking care of her 4 grandkids.  She has been unable to eat much food in the past several weeks, stating that her grandkids eat most of the food so there is not much leftover for her.    In terms of her diabetes regimen, she has been on glipizide, glargine at night, Janumet.  She takes these inconsistently (about every other day).  P: We will discontinue her old medications and start her on Synjardy 5-500 mg twice per day.  Patient was educated on these medications, and she is in agreement with new regimen.  A1c obtained at this visit, will contact patient with results.   ADDENDUM: Patient asymptomatic, no clinical signs of DKA.  Glucose 540.  Corrected sodium of 135, K 4.8, bicarb 25, anion gap 10.  Given high glucose levels, plan to add back her insulin regimen, glargine 30 units nightly.  Attempted to contact the patient x3, no answer.  Left voicemails. Again attempted to call patient on 11/8.  Patient did not answer, left voicemails.  Discussed with RN who will attempt to contact the patient again today, 11/9.   Follow-up in 1 month for DM check.

## 2020-12-21 NOTE — Assessment & Plan Note (Signed)
PHQ-9 of 14 today.  Patient denies suicidal thoughts or thoughts of harming others.  Will further address at follow-up in 1 month.

## 2020-12-21 NOTE — Progress Notes (Signed)
Internal Medicine Clinic Attending ? ?I saw and evaluated the patient.  I personally confirmed the key portions of the history and exam documented by Dr. Bonanno and I reviewed pertinent patient test results.  The assessment, diagnosis, and plan were formulated together and I agree with the documentation in the resident?s note. ? ?

## 2020-12-21 NOTE — Progress Notes (Signed)
   CC: establish care, HTN and DM check  HPI:  [name] is a 55 y.o. with a PMHx of DM2 and HIV who presents to the clinic today for the establishment of care. Please see problem-based list for further details, assessments, and plans.   Past Medical History:  Diagnosis Date   Depression    Diabetes mellitus    History of positive PPD, treatment status unknown    HIV infection (HCC)    Review of Systems:  Review of Systems  Constitutional:  Positive for malaise/fatigue and weight loss. Negative for chills and fever.  HENT: Negative.    Eyes: Negative.   Respiratory:  Negative for shortness of breath.   Cardiovascular:  Negative for chest pain, orthopnea and leg swelling.  Gastrointestinal: Negative.   Genitourinary:  Positive for frequency.  Skin: Negative.   Neurological:  Positive for tingling and weakness.  Psychiatric/Behavioral: Negative.      Physical Exam:  Vitals:   12/21/20 1011  BP: (!) 164/85  Pulse: 87  Temp: 97.7 F (36.5 C)  TempSrc: Oral  SpO2: 100%  Weight: 149 lb 14.4 oz (68 kg)  Height: 5\' 2"  (1.575 m)   Physical Exam Constitutional:      Comments: Tired-appearing  HENT:     Head: Normocephalic and atraumatic.  Eyes:     Extraocular Movements: Extraocular movements intact.     Pupils: Pupils are equal, round, and reactive to light.  Cardiovascular:     Rate and Rhythm: Regular rhythm. Tachycardia present.     Pulses: Normal pulses.     Heart sounds: No murmur heard.   No friction rub. No gallop.  Pulmonary:     Effort: Pulmonary effort is normal. No respiratory distress.     Breath sounds: Normal breath sounds. No wheezing, rhonchi or rales.  Abdominal:     General: There is no distension.     Palpations: Abdomen is soft.  Musculoskeletal:        General: No swelling or deformity.     Cervical back: Neck supple.     Right lower leg: No edema.     Left lower leg: No edema.  Skin:    General: Skin is warm and dry.  Neurological:      General: No focal deficit present.     Mental Status: She is alert and oriented to person, place, and time.     Sensory: No sensory deficit.  Psychiatric:        Mood and Affect: Mood normal.        Behavior: Behavior normal.     Assessment & Plan:   See Encounters Tab for problem based charting.  Patient seen with Dr. 

## 2020-12-21 NOTE — Telephone Encounter (Signed)
-----   Message from Randall Hiss, MD sent at 12/21/2020  2:34 PM EDT ----- Regarding: FW: pt needs to go to ER w BG aim 500s or see pcp ----- Message ----- From: Interface, Lab In [place] Sent: 12/21/2020  11:52 AM EDT To: Randall Hiss, MD

## 2020-12-21 NOTE — Telephone Encounter (Signed)
Labs were obtained during a new patient appointment with Internal medicine. Looks like their office attempted to reach her numerous times as well to discuss results. RN left an additional message to instruct her to go to the ED.   Makya Yurko Loyola Mast, RN

## 2020-12-21 NOTE — Assessment & Plan Note (Signed)
Patient has a history of hypertension for which she has been on losartan.  Patient states that she takes this medication every day.  BP of 164/85 today.  P: We will discontinue losartan at this time and transition to Hyzaar 50-12.5 mg daily.  Patient will follow-up in 1 month for BP check.

## 2020-12-21 NOTE — Addendum Note (Signed)
Addended by: Erlinda Hong T on: 12/21/2020 03:36 PM   Modules accepted: Orders

## 2020-12-21 NOTE — Assessment & Plan Note (Signed)
Patient has a history of HIV for which she is followed by Dr. Ninetta Lights of ID.  She is currently on Odefsey, however she states that she takes this inconsistently (about every other day).  P: Continue Odefsey per ID

## 2020-12-22 LAB — ANEMIA PROFILE B
Basophils Absolute: 0 10*3/uL (ref 0.0–0.2)
Basos: 1 %
EOS (ABSOLUTE): 0.1 10*3/uL (ref 0.0–0.4)
Eos: 1 %
Ferritin: 414 ng/mL — ABNORMAL HIGH (ref 15–150)
Folate: >20 ng/mL (ref >3.0)
Hematocrit: 40.2 % (ref 34.0–46.6)
Hemoglobin: 12.3 g/dL (ref 11.1–15.9)
Immature Grans (Abs): 0 10*3/uL (ref 0.0–0.1)
Immature Granulocytes: 0 %
Iron Saturation: 18 % (ref 15–55)
Iron: 52 ug/dL (ref 27–159)
Lymphocytes Absolute: 3.1 10*3/uL (ref 0.7–3.1)
Lymphs: 52 %
MCH: 21.2 pg — ABNORMAL LOW (ref 26.6–33.0)
MCHC: 30.6 g/dL — ABNORMAL LOW (ref 31.5–35.7)
MCV: 69 fL — ABNORMAL LOW (ref 79–97)
Monocytes Absolute: 0.4 10*3/uL (ref 0.1–0.9)
Monocytes: 6 %
Neutrophils Absolute: 2.4 10*3/uL (ref 1.4–7.0)
Neutrophils: 40 %
Platelets: 335 10*3/uL (ref 150–450)
RBC: 5.81 x10E6/uL — ABNORMAL HIGH (ref 3.77–5.28)
RDW: 13.4 % (ref 11.7–15.4)
Retic Ct Pct: 1.7 % (ref 0.6–2.6)
Total Iron Binding Capacity: 295 ug/dL (ref 250–450)
UIBC: 243 ug/dL (ref 131–425)
Vitamin B-12: 735 pg/mL (ref 232–1245)
WBC: 5.9 10*3/uL (ref 3.4–10.8)

## 2020-12-22 LAB — MICROALBUMIN / CREATININE URINE RATIO
Creatinine, Urine: 38.8 mg/dL
Microalb/Creat Ratio: 529 mg/g creat — ABNORMAL HIGH (ref 0–29)
Microalbumin, Urine: 205.2 ug/mL

## 2020-12-22 LAB — TSH: TSH: 1.35 u[IU]/mL (ref 0.450–4.500)

## 2020-12-25 ENCOUNTER — Telehealth: Payer: Self-pay

## 2020-12-25 DIAGNOSIS — E1140 Type 2 diabetes mellitus with diabetic neuropathy, unspecified: Secondary | ICD-10-CM

## 2020-12-25 DIAGNOSIS — I10 Essential (primary) hypertension: Secondary | ICD-10-CM

## 2020-12-25 DIAGNOSIS — Z794 Long term (current) use of insulin: Secondary | ICD-10-CM

## 2020-12-25 MED ORDER — LOSARTAN POTASSIUM-HCTZ 50-12.5 MG PO TABS: 1.0000 | ORAL_TABLET | Freq: Every day | ORAL | 1 refills | Status: DC

## 2020-12-25 MED ORDER — SYNJARDY 5-500 MG PO TABS: 1.0000 | ORAL_TABLET | Freq: Two times a day (BID) | ORAL | 1 refills | Status: DC

## 2020-12-25 NOTE — Addendum Note (Signed)
Addended by: Verdene Lennert on: 12/25/2020 01:28 PM   Modules accepted: Orders

## 2020-12-26 NOTE — Telephone Encounter (Signed)
Attempted to contact patient on home and mobile phones with lab results, no answer. Left VMs

## 2020-12-28 ENCOUNTER — Telehealth: Payer: Self-pay | Admitting: Internal Medicine

## 2020-12-28 NOTE — Telephone Encounter (Signed)
Please advise if a Referral can be placed [place] Merchant navy officer). Patient seen on 12/21/2020.  Pt in need of [facility].  Per [facility] patient needs help as soon as possible.

## 2020-12-29 NOTE — Addendum Note (Signed)
Addended by: Andrey Campanile on: 12/29/2020 10:09 AM   Modules accepted: Orders

## 2021-01-22 ENCOUNTER — Encounter: Payer: Medicaid Other | Admitting: Internal Medicine

## 2021-01-25 ENCOUNTER — Telehealth: Payer: Self-pay | Admitting: *Deleted

## 2021-01-25 ENCOUNTER — Encounter: Payer: Medicaid Other | Admitting: Internal Medicine

## 2021-01-25 NOTE — Telephone Encounter (Signed)
Call placed to patient for today's missed appt. R/s to 12/14 at 1015.

## 2021-01-31 ENCOUNTER — Telehealth: Payer: Self-pay | Admitting: *Deleted

## 2021-01-31 ENCOUNTER — Encounter: Payer: Medicaid Other | Admitting: Internal Medicine

## 2021-01-31 NOTE — Telephone Encounter (Addendum)
Call from [place] from [facility] went out to visit Ashley Vaughn today.  Has had high glucose levels. Takes Insulin when she feels that she needs it. Does not have a Glucose Monitor.  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  Missed appointment today and other appointments due to caring for grandchildren.  Ashley Vaughn needs another meter previously had an [employer].  Ashley Vaughn is also complaining of foot pain and would like appointment with a Podiatrist.  Informed Santina Evans that Ashley Vaughn would need an appointment with the Clinics prior to getting a referral or she may have an Insurance that would allow her to make her own appointment.  Santina Evans to tell Ashley Vaughn and have her call to make an appointment when she can go.  Ashley Vaughn still needs to come in for a follow up appointment about her Diabetes.

## 2021-02-22 ENCOUNTER — Encounter: Payer: Medicaid Other | Admitting: Internal Medicine

## 2021-02-27 ENCOUNTER — Encounter: Payer: Medicaid Other | Admitting: Internal Medicine

## 2021-02-28 ENCOUNTER — Encounter: Payer: Medicaid Other | Admitting: Internal Medicine

## 2021-03-01 ENCOUNTER — Other Ambulatory Visit: Payer: Self-pay

## 2021-03-01 ENCOUNTER — Other Ambulatory Visit: Payer: Medicaid Other

## 2021-03-01 DIAGNOSIS — B20 Human immunodeficiency virus [HIV] disease: Secondary | ICD-10-CM

## 2021-03-16 ENCOUNTER — Ambulatory Visit (INDEPENDENT_AMBULATORY_CARE_PROVIDER_SITE_OTHER): Payer: Medicaid Other | Admitting: Infectious Diseases

## 2021-03-16 ENCOUNTER — Other Ambulatory Visit (HOSPITAL_COMMUNITY): Payer: Self-pay

## 2021-03-16 ENCOUNTER — Encounter: Payer: Self-pay | Admitting: Infectious Diseases

## 2021-03-16 ENCOUNTER — Other Ambulatory Visit: Payer: Self-pay

## 2021-03-16 ENCOUNTER — Other Ambulatory Visit: Payer: Self-pay | Admitting: Infectious Diseases

## 2021-03-16 VITALS — BP 173/79 | HR 83 | Temp 98.5°F | Wt 148.0 lb

## 2021-03-16 DIAGNOSIS — B3731 Acute candidiasis of vulva and vagina: Secondary | ICD-10-CM

## 2021-03-16 DIAGNOSIS — B20 Human immunodeficiency virus [HIV] disease: Secondary | ICD-10-CM

## 2021-03-16 DIAGNOSIS — E1140 Type 2 diabetes mellitus with diabetic neuropathy, unspecified: Secondary | ICD-10-CM | POA: Diagnosis not present

## 2021-03-16 DIAGNOSIS — L02224 Furuncle of groin: Secondary | ICD-10-CM | POA: Diagnosis present

## 2021-03-16 DIAGNOSIS — Z794 Long term (current) use of insulin: Secondary | ICD-10-CM

## 2021-03-16 DIAGNOSIS — E1165 Type 2 diabetes mellitus with hyperglycemia: Secondary | ICD-10-CM

## 2021-03-16 DIAGNOSIS — I10 Essential (primary) hypertension: Secondary | ICD-10-CM

## 2021-03-16 DIAGNOSIS — Z113 Encounter for screening for infections with a predominantly sexual mode of transmission: Secondary | ICD-10-CM

## 2021-03-16 MED ORDER — SYNJARDY 5-500 MG PO TABS: 1.0000 | ORAL_TABLET | Freq: Two times a day (BID) | ORAL | 1 refills | Status: DC

## 2021-03-16 MED ORDER — FLUCONAZOLE 100 MG PO TABS: 100.0000 mg | ORAL_TABLET | Freq: Every day | ORAL | 0 refills | Status: DC

## 2021-03-16 MED ORDER — ODEFSEY 200-25-25 MG PO TABS: 1.0000 | ORAL_TABLET | Freq: Every day | ORAL | 5 refills | Status: DC

## 2021-03-16 MED ORDER — DOXYCYCLINE HYCLATE 100 MG PO TABS: 100.0000 mg | ORAL_TABLET | Freq: Two times a day (BID) | ORAL | 0 refills | Status: DC

## 2021-03-16 MED ORDER — LOSARTAN POTASSIUM-HCTZ 50-12.5 MG PO TABS: 1.0000 | ORAL_TABLET | Freq: Every day | ORAL | 1 refills | Status: DC

## 2021-03-16 NOTE — Assessment & Plan Note (Addendum)
She needs to get back on her rx asap. Will work on getting her into [facility] asap.  Made appt for her on TAPAM at 11am.

## 2021-03-16 NOTE — Assessment & Plan Note (Signed)
Examined with nurse- she has a couple of small nodules, I wonder if she has foliulitis? Needs better dm control Will give her bactrim and diflucan.

## 2021-03-16 NOTE — Assessment & Plan Note (Addendum)
She is approved for cabaneuva.  Will have her meet with pharm,  Will refill her meds til she is started Dental referral placed today for [facility]. Information to schedule appointment completed today. Send to dental and [facility], she needs bus basses.  rtc in 1 month.

## 2021-03-16 NOTE — Addendum Note (Signed)
Addended by: Harley Alto on: 03/16/2021 12:37 PM   Modules accepted: Orders

## 2021-03-16 NOTE — Assessment & Plan Note (Signed)
Will get her in with TAPAM asap

## 2021-03-16 NOTE — Progress Notes (Signed)
Subjective:    Patient ID: Ashley Vaughn, female  DOB: 07/08/1965, 56 y.o.        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 On complera 12-2010, changed to odefsy 02-2015. Was taking with food.  Is completely off her meds.  States she was taken off her insulin. Had to quit her job because she couldn't fxn due to neuropathy in her feet.  Can't find her BP rx.   She is interested in cabaneuva. Concerned that her family will be looking into her rx. Has occas missed doses.  Daughter is living with her now.  Has transportation issues. Has had genital lesions for last 2 weeks.  Has yeast infection as well.   She was seen in ED 03-01-21 for abd pain. She had CT that was non-diagnostic.   HIV 1 RNA Quant  Date Value  09/08/2020 Not Detected Copies/mL  02/25/2020 <20 Copies/mL  09/06/2019 <20 NOT DETECTED copies/mL   CD4 T Cell Abs (/uL)  Date Value  09/06/2019 1,491  03/12/2019 1,208  02/25/2018 1,350     Health Maintenance  Topic Date Due   OPHTHALMOLOGY EXAM  Never done   Zoster Vaccines- Shingrix (1 of 2) Never done   COLONOSCOPY (Pts 45-16yrs Insurance coverage will need to be confirmed)  Never done   MAMMOGRAM  02/26/2012   PAP SMEAR-Modifier  01/21/2013   COVID-19 Vaccine (2 - Moderna risk series) 01/17/2020   LIPID PANEL  09/05/2020   INFLUENZA VACCINE  09/18/2020   HEMOGLOBIN A1C  03/23/2021   TETANUS/TDAP  06/30/2021   FOOT EXAM  12/21/2021   Hepatitis C Screening  Completed   HIV Screening  Completed   HPV VACCINES  Aged Out      Review of Systems  Constitutional:  Positive for weight loss. Negative for chills and fever.  Eyes:  Positive for blurred vision.  Respiratory:  Negative for cough and shortness of breath.   Cardiovascular:  Negative for chest pain.  Gastrointestinal:  Negative for abdominal pain, constipation and diarrhea.  Genitourinary:  Positive for frequency. Negative for dysuria.   Neurological:  Positive for sensory change and headaches.   Please see HPI. All other systems reviewed and negative.     Objective:  Physical Exam Constitutional:      General: She is not in acute distress.    Appearance: Normal appearance. She is not ill-appearing.  HENT:     Mouth/Throat:     Mouth: Mucous membranes are moist.     Pharynx: No oropharyngeal exudate.  Eyes:     Extraocular Movements: Extraocular movements intact.     Pupils: Pupils are equal, round, and reactive to light.  Cardiovascular:     Rate and Rhythm: Normal rate and regular rhythm.     Pulses:          Dorsalis pedis pulses are 2+ on the right side and 2+ on the left side.  Pulmonary:     Effort: Pulmonary effort is normal.     Breath sounds: Normal breath sounds.  Abdominal:     General: Bowel sounds are normal.     Palpations: Abdomen is soft.  Genitourinary:    Comments: Seen with nurse.  Small nodules, clear to white d/c. No ulcers.  Musculoskeletal:        General: Normal range of motion.     Cervical back: Normal range of motion and neck supple.  Right lower leg: No edema.     Left lower leg: No edema.     Right foot: Normal range of motion. No deformity, Charcot foot or foot drop.     Left foot: Normal range of motion. No deformity, Charcot foot or foot drop.  Feet:     Right foot:     Protective Sensation:  1 site tested.  1 site sensed.    Skin integrity: No ulcer, skin breakdown or warmth.     Toenail Condition: Right toenails are normal.     Left foot:     Protective Sensation:  1 site tested.      Skin integrity: No ulcer, blister or warmth.     Toenail Condition: Left toenails are normal.  Neurological:     General: No focal deficit present.     Mental Status: She is alert.  Psychiatric:        Mood and Affect: Mood normal.          Assessment & Plan:

## 2021-03-27 LAB — T-HELPER CELLS (CD4) COUNT (NOT AT ARMC)
Absolute CD4: 1404 cells/uL (ref 490–1740)
CD4 T Helper %: 37 % (ref 30–61)
Total lymphocyte count: 3768 cells/uL (ref 850–3900)

## 2021-03-27 LAB — HIV-1 INTEGRASE GENOTYPE

## 2021-03-27 LAB — HIV RNA, RTPCR W/R GT (RTI, PI,INT)
HIV 1 RNA Quant: 1740 copies/mL — ABNORMAL HIGH
HIV-1 RNA Quant, Log: 3.24 Log copies/mL — ABNORMAL HIGH

## 2021-03-27 LAB — HIV-1 GENOTYPE: HIV-1 Genotype: DETECTED — AB

## 2021-03-29 ENCOUNTER — Telehealth: Payer: Self-pay | Admitting: Infectious Diseases

## 2021-03-29 NOTE — Telephone Encounter (Signed)
Called pt and left VM

## 2021-04-09 ENCOUNTER — Encounter: Payer: Self-pay | Admitting: *Deleted

## 2021-04-13 ENCOUNTER — Ambulatory Visit: Payer: Medicaid Other | Admitting: Infectious Diseases

## 2021-04-18 ENCOUNTER — Other Ambulatory Visit: Payer: Self-pay | Admitting: Infectious Diseases

## 2021-04-18 DIAGNOSIS — I10 Essential (primary) hypertension: Secondary | ICD-10-CM

## 2021-04-20 ENCOUNTER — Telehealth: Payer: Self-pay

## 2021-04-20 ENCOUNTER — Ambulatory Visit: Payer: Medicaid Other | Admitting: Infectious Diseases

## 2021-04-20 NOTE — Telephone Encounter (Signed)
Called patient to reschedule missed appointment this morning. Left HIPAA-compliant message on patient's identified voicemail, requesting call back. ? ?Wyvonne Lenz, RN  ?

## 2021-05-04 ENCOUNTER — Other Ambulatory Visit: Payer: Medicaid Other

## 2021-05-04 ENCOUNTER — Other Ambulatory Visit: Payer: Self-pay

## 2021-05-04 DIAGNOSIS — B20 Human immunodeficiency virus [HIV] disease: Secondary | ICD-10-CM

## 2021-05-07 ENCOUNTER — Other Ambulatory Visit: Payer: Self-pay | Admitting: Infectious Diseases

## 2021-05-07 ENCOUNTER — Telehealth: Payer: Self-pay

## 2021-05-07 DIAGNOSIS — I10 Essential (primary) hypertension: Secondary | ICD-10-CM

## 2021-05-07 LAB — COMPLETE METABOLIC PANEL WITH GFR
AG Ratio: 1.1 (calc) (ref 1.0–2.5)
ALT: 9 U/L (ref 6–29)
AST: 10 U/L (ref 10–35)
Albumin: 4 g/dL (ref 3.6–5.1)
Alkaline phosphatase (APISO): 121 U/L (ref 37–153)
BUN/Creatinine Ratio: 17 (calc) (ref 6–22)
BUN: 24 mg/dL (ref 7–25)
CO2: 25 mmol/L (ref 20–32)
Calcium: 9.5 mg/dL (ref 8.6–10.4)
Chloride: 97 mmol/L — ABNORMAL LOW (ref 98–110)
Creat: 1.42 mg/dL — ABNORMAL HIGH (ref 0.50–1.03)
Globulin: 3.6 g/dL (calc) (ref 1.9–3.7)
Glucose, Bld: 524 mg/dL (ref 65–99)
Potassium: 4.1 mmol/L (ref 3.5–5.3)
Sodium: 133 mmol/L — ABNORMAL LOW (ref 135–146)
Total Bilirubin: 0.2 mg/dL (ref 0.2–1.2)
Total Protein: 7.6 g/dL (ref 6.1–8.1)
eGFR: 44 mL/min/{1.73_m2} — ABNORMAL LOW (ref >=60)

## 2021-05-07 LAB — CBC WITH DIFFERENTIAL/PLATELET
Absolute Monocytes: 422 cells/uL (ref 200–950)
Basophils Absolute: 40 cells/uL (ref 0–200)
Basophils Relative: 0.6 %
Eosinophils Absolute: 134 cells/uL (ref 15–500)
Eosinophils Relative: 2 %
HCT: 38.3 % (ref 35.0–45.0)
Hemoglobin: 11.4 g/dL — ABNORMAL LOW (ref 11.7–15.5)
Lymphs Abs: 3384 cells/uL (ref 850–3900)
MCH: 21.5 pg — ABNORMAL LOW (ref 27.0–33.0)
MCHC: 29.8 g/dL — ABNORMAL LOW (ref 32.0–36.0)
MCV: 72.3 fL — ABNORMAL LOW (ref 80.0–100.0)
MPV: 10.6 fL (ref 7.5–12.5)
Monocytes Relative: 6.3 %
Neutro Abs: 2720 cells/uL (ref 1500–7800)
Neutrophils Relative %: 40.6 %
Platelets: 401 10*3/uL — ABNORMAL HIGH (ref 140–400)
RBC: 5.3 10*6/uL — ABNORMAL HIGH (ref 3.80–5.10)
RDW: 15.2 % — ABNORMAL HIGH (ref 11.0–15.0)
Total Lymphocyte: 50.5 %
WBC: 6.7 10*3/uL (ref 3.8–10.8)

## 2021-05-07 LAB — HIV-1 RNA QUANT-NO REFLEX-BLD
HIV 1 RNA Quant: NOT DETECTED Copies/mL
HIV-1 RNA Quant, Log: NOT DETECTED Log cps/mL

## 2021-05-07 LAB — T-HELPER CELLS (CD4) COUNT (NOT AT ARMC)
Absolute CD4: 1447 cells/uL (ref 490–1740)
CD4 T Helper %: 40 % (ref 30–61)
Total lymphocyte count: 3650 cells/uL (ref 850–3900)

## 2021-05-07 NOTE — Telephone Encounter (Signed)
Attempted to call patient regarding alb results. Left voicemail requesting she call office back. Will need her to follow up with PCP or go to ED regarding glucose levels. ?[name], RMA ? ?

## 2021-05-07 NOTE — Telephone Encounter (Signed)
Received call from [facility] to report critical lab value glucose 524. Provider aware.  ? ?Called patient again, relayed that her blood sugar is very high. She says she "feels pretty good," but that she is urinating very frequently and is thirsty. Denies any other symptoms.  ? ?She says she does not check her blood sugars at home because she has never received a monitor. Reports that she is taking her Synjardy. Advised that she reach out to her primary care provider, Dr. Alroy Bailiff, to see about going in for a same day appointment or nurse visit for a blood glucose check. Provided her with their phone number.  ? ?Advised that if she is unable to be seen by her PCP, that she needs to go to urgent care or ED due to her high glucose and her symptoms. Patient verbalized understanding and has no further questions.  ? ?Sandie Ano, RN ? ? ? ?

## 2021-05-07 NOTE — Telephone Encounter (Signed)
-----   Message from Randall Hiss, MD sent at 05/07/2021  8:11 AM EDT ----- ?Pts diabetes continues to be completely out of control. Can she go to ER vs a PCP and get back on her insulin? ?----- Message ----- ?From: Interface, [facility] Lab Results In ?Sent: 05/04/2021   3:55 PM EDT ?To: Randall Hiss, MD ? ? ?

## 2021-05-08 ENCOUNTER — Encounter: Payer: Medicaid Other | Admitting: Internal Medicine

## 2021-05-09 ENCOUNTER — Encounter: Payer: Self-pay | Admitting: Internal Medicine

## 2021-05-09 ENCOUNTER — Ambulatory Visit (INDEPENDENT_AMBULATORY_CARE_PROVIDER_SITE_OTHER): Payer: Medicaid Other | Admitting: Internal Medicine

## 2021-05-09 ENCOUNTER — Other Ambulatory Visit: Payer: Self-pay | Admitting: Dietician

## 2021-05-09 ENCOUNTER — Other Ambulatory Visit: Payer: Self-pay

## 2021-05-09 VITALS — BP 109/74 | HR 87 | Temp 98.1°F | Ht 62.0 in | Wt 143.6 lb

## 2021-05-09 DIAGNOSIS — Z794 Long term (current) use of insulin: Secondary | ICD-10-CM | POA: Diagnosis not present

## 2021-05-09 DIAGNOSIS — N920 Excessive and frequent menstruation with regular cycle: Secondary | ICD-10-CM

## 2021-05-09 DIAGNOSIS — I10 Essential (primary) hypertension: Secondary | ICD-10-CM

## 2021-05-09 DIAGNOSIS — F322 Major depressive disorder, single episode, severe without psychotic features: Secondary | ICD-10-CM

## 2021-05-09 DIAGNOSIS — E1140 Type 2 diabetes mellitus with diabetic neuropathy, unspecified: Secondary | ICD-10-CM

## 2021-05-09 DIAGNOSIS — E1165 Type 2 diabetes mellitus with hyperglycemia: Secondary | ICD-10-CM | POA: Diagnosis not present

## 2021-05-09 LAB — POCT GLYCOSYLATED HEMOGLOBIN (HGB A1C): HbA1c POC (<> result, manual entry): >14 % — AB (ref 4.0–5.6)

## 2021-05-09 LAB — GLUCOSE, CAPILLARY: Glucose-Capillary: 487 mg/dL — ABNORMAL HIGH (ref 70–99)

## 2021-05-09 MED ORDER — DULOXETINE HCL 20 MG PO CPEP: 20.0000 mg | ORAL_CAPSULE | Freq: Two times a day (BID) | ORAL | 3 refills | Status: DC

## 2021-05-09 MED ORDER — ACCU-CHEK GUIDE VI STRP: ORAL_STRIP | 12 refills | Status: DC

## 2021-05-09 NOTE — Assessment & Plan Note (Addendum)
The patient has a history of poorly controlled diabetes.  Her last A1c was greater than 14 last November.  At that time, she was started on [place].  Review of records shows that she had had this filled for a couple of months, however she states she does not currently take anything for diabetes.  She is requesting a meter to check her sugars as well as new test strips. ? ?A1c of greater than 14 today. ? ?Plan: ?Will refill Synjardy today.  Also referred to [facility] today.  Patient provided a new meter with test strips today.  Follow-up in 2-4 weeks for DM check. ?
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
## 2021-05-09 NOTE — Progress Notes (Signed)
? ?  CC: routine follow-up ? ?HPI: ? ?[name] is a 56 y.o. with past medical history as noted below who presents to the clinic today for a routine follow-up. Please see problem-based list for further details, assessments, and plans.  ? ?Past Medical History:  ?Diagnosis Date  ? Depression   ? Diabetes mellitus   ? History of positive PPD, treatment status unknown   ? HIV infection (HCC)   ? ?Review of Systems: Negative aside from that listed in individualized problem based charting.  ? ?Physical Exam: ? ?Vitals:  ? 05/09/21 1447 05/09/21 1449  ?BP:  109/74  ?Pulse:  87  ?Temp:  98.1 ?F (36.7 ?C)  ?TempSrc:  Oral  ?SpO2:  100%  ?Weight: 143 lb 9.6 oz (65.1 kg)   ?Height: 5\' 2"  (1.575 m)   ? ?General: NAD, nl appearance ?HE: Normocephalic, atraumatic, EOMI, Conjunctivae normal ?ENT: No congestion, no rhinorrhea, no exudate or erythema  ?Cardiovascular: Normal rate, regular rhythm. No murmurs, rubs, or gallops ?Pulmonary: Effort normal, breath sounds normal. No wheezes, rales, or rhonchi ?Abdominal: soft, nontender, bowel sounds present ?Musculoskeletal: no swelling, deformity, injury or tenderness in extremities ?Skin: Warm, dry, no bruising, erythema, or rash ?Psychiatric/Behavioral: normal mood, normal behavior    ? ? ?Assessment & Plan:  ? ?See Encounters Tab for problem based charting. ? ?Patient discussed with Dr. ? ?
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
## 2021-05-09 NOTE — Telephone Encounter (Signed)
Pt request refill of Lancets and test strips ?

## 2021-05-09 NOTE — Assessment & Plan Note (Signed)
BP Readings from Last 3 Encounters:  ?05/09/21 109/74  ?03/16/21 (!) 173/79  ?12/21/20 (!) 164/85  ? ?The patient is on Hyzaar for her blood pressure.  She states she has been compliant with this medication. ? ?Plan: ?Blood pressures are well controlled today.  Continue current regimen. ?

## 2021-05-10 MED ORDER — ACCU-CHEK SOFTCLIX LANCETS MISC: 3 refills | Status: DC

## 2021-05-10 MED ORDER — ACCU-CHEK GUIDE VI STRP: ORAL_STRIP | 3 refills | Status: DC

## 2021-05-11 ENCOUNTER — Telehealth: Payer: Self-pay

## 2021-05-11 ENCOUNTER — Ambulatory Visit: Payer: Medicaid Other | Admitting: Infectious Diseases

## 2021-05-11 NOTE — Telephone Encounter (Signed)
Called patient to reschedule missed appointment. No answer. Left HIPAA-compliant message on identified voicemail requesting call back. ? ?Wyvonne Lenz, RN  ?

## 2021-05-11 NOTE — Progress Notes (Signed)
Internal Medicine Clinic Attending ° °Case discussed with Dr. Bonanno  °  At the time of the visit.  We reviewed the resident’s history and exam and pertinent patient test results.  I agree with the assessment, diagnosis, and plan of care documented in the resident’s note. ° °

## 2021-05-15 ENCOUNTER — Telehealth: Payer: Self-pay

## 2021-05-15 NOTE — Telephone Encounter (Signed)
Patient called wanting to discuss lab results as she says she is peeing a lot and having problems with her leg and balance. She thinks that her HIV must be "declining".  ? ?Discussed with her that her viral load is undetectable and her CD4 count is excellent. Advised that her frequent urination is likely due to her diabetes. Appears that her PCP recently checked a blood glucose and A1c. Advised her to reach out to her PCP to discuss her symptoms as her HIV is well controlled. Patient verbalized understanding and has no further questions.  ? ?Beryle Flock, RN ? ?

## 2021-05-16 NOTE — Addendum Note (Signed)
Addended by: Kenza Munar E on: 05/16/2021 11:32 AM ? ? Modules accepted: Level of Service ? ?

## 2021-05-17 ENCOUNTER — Other Ambulatory Visit: Payer: Self-pay

## 2021-05-17 ENCOUNTER — Telehealth: Payer: Self-pay

## 2021-05-17 ENCOUNTER — Telehealth: Payer: Medicaid Other | Admitting: Infectious Diseases

## 2021-05-17 NOTE — Telephone Encounter (Signed)
Called patient multiple times for phone visit today, no answer. Left HIPAA compliant voicemail requesting callback.  ? ?Sandie Ano, RN ? ?

## 2021-05-21 ENCOUNTER — Encounter: Payer: Medicaid Other | Admitting: Dietician

## 2021-05-23 ENCOUNTER — Other Ambulatory Visit: Payer: Self-pay | Admitting: Dietician

## 2021-05-23 ENCOUNTER — Encounter: Payer: Self-pay | Admitting: Dietician

## 2021-05-23 ENCOUNTER — Ambulatory Visit (INDEPENDENT_AMBULATORY_CARE_PROVIDER_SITE_OTHER): Payer: Medicaid Other | Admitting: Dietician

## 2021-05-23 ENCOUNTER — Ambulatory Visit (INDEPENDENT_AMBULATORY_CARE_PROVIDER_SITE_OTHER): Payer: Medicaid Other | Admitting: Internal Medicine

## 2021-05-23 ENCOUNTER — Encounter: Payer: Medicaid Other | Admitting: Dietician

## 2021-05-23 DIAGNOSIS — E1165 Type 2 diabetes mellitus with hyperglycemia: Secondary | ICD-10-CM

## 2021-05-23 DIAGNOSIS — Z794 Long term (current) use of insulin: Secondary | ICD-10-CM

## 2021-05-23 DIAGNOSIS — F322 Major depressive disorder, single episode, severe without psychotic features: Secondary | ICD-10-CM

## 2021-05-23 DIAGNOSIS — B20 Human immunodeficiency virus [HIV] disease: Secondary | ICD-10-CM | POA: Diagnosis not present

## 2021-05-23 DIAGNOSIS — E1140 Type 2 diabetes mellitus with diabetic neuropathy, unspecified: Secondary | ICD-10-CM | POA: Diagnosis not present

## 2021-05-23 MED ORDER — DULOXETINE HCL 60 MG PO CPEP: 60.0000 mg | ORAL_CAPSULE | Freq: Every day | ORAL | 0 refills | Status: DC

## 2021-05-23 MED ORDER — ACCU-CHEK SOFTCLIX LANCET DEV KIT: PACK | 1 refills | Status: DC

## 2021-05-23 MED ORDER — LANTUS SOLOSTAR 100 UNIT/ML ~~LOC~~ SOPN: 30.0000 [IU] | PEN_INJECTOR | Freq: Every day | SUBCUTANEOUS | 3 refills | Status: DC

## 2021-05-23 MED ORDER — ACCU-CHEK SOFTCLIX LANCETS MISC: 3 refills | Status: DC

## 2021-05-23 NOTE — Progress Notes (Signed)
?  [name] Telephone Encounter ?Continuity Care Appointment ? ?HPI:  ?This telephone encounter was created for [name] on 05/23/2021 for the following purpose/cc diabetes/neuropathy. ?Patient has a lot of SDOH preventing her from controlling her diabetes well, however she is motivated to get better control of her disease to help her neuropathy and to help her care for her grand kids. She spoke with the diabetes educator today and is open to restarting insulin. She would also like something to help with her neuropathy.  ? ?Past Medical History:  ?Past Medical History:  ?Diagnosis Date  ? Depression   ? Diabetes mellitus   ? History of positive PPD, treatment status unknown   ? HIV infection ([place])   ?  ? ?ROS:  ?Positive for foot pain, difficulty with ambulation/exertion  ? ?Assessment / Plan / Recommendations:  ?Please see A&P under problem oriented charting for assessment of the patient's acute and chronic medical conditions.  ?As always, pt is advised that if symptoms worsen or new symptoms arise, they should go to an urgent care facility or to to ER for further evaluation.  ? ?Consent and Medical Decision Making:  ?Patient discussed with Dr. Angelia Mould ?This is a telephone encounter between Ashley Vaughn and [place] on 05/23/2021 for neuropathy. The visit was conducted with the patient located at home and [facility] at [facility] The patient's identity was confirmed using their DOB and current address. The patient has consented to being evaluated through a telephone encounter and understands the associated risks (an examination cannot be done and the patient may need to come in for an appointment) / benefits (allows the patient to remain at home, decreasing exposure to coronavirus). I personally spent 15 minutes on medical discussion.   ? ? ?

## 2021-05-23 NOTE — Progress Notes (Signed)
Diabetes Self-Management Education ? ?Visit Type: Follow-up ? ?Appt. Start Time: 916 Appt. End Time: [id number] ? ?05/23/2021 ? ?[name], identified by name and date of birth, is a 56 y.o. female with a diagnosis of Diabetes:  .Type 2  ? ?ASSESSMENT ? ?This is a telephone encounter between Ashley Vaughn  and Ashley Vaughn ? on 05/23/2021 for Diabetes Self Management Education & Support . The visit was conducted with the patient located at home and Ashley Vaughn ? at [facility]. The patient's identity was confirmed using their DOB and current address. The patient has consented to being evaluated through a telephone encounter and understands the associated risks / benefits (allows the patient to remain at home, decreasing exposure to coronavirus). I personally spent 30 minutes on medical nutrition therapy discussion.  ? ?[name] states her social issues are taking precedent right now, but she is willing to discuss care of her diabetes. She has social work and case managers trying to help her and is also involved in [facility] for support of food. She states she gets 800$ in food stamps, but it is not enough to feed her 4 grand children and herself. Her pain in her feet is  preventing her from  getting up and down stairs . She states she is  about to lose her housing, is having difficulty caring for 4 grandchildren ages  77, 42, 4, 19. She states she lost her car and job in December and is also depressed for which she takes an antidepressant.  ? ?The last time she checked her blood sugar was right after her visit here ~ 2 weeks ago and the result was 300. She states her lancing device broke. She states she is willing to restart insulin if needed to help her feel better. She agreed to follow up in 1 week.  ? ?Lab Results  ?Component Value Date  ? HGBA1C >14.0 (A) 05/09/2021  ? HGBA1C >14.0 (A) 12/21/2020  ? HGBA1C >14.0 (H) 09/08/2020  ? HGBA1C >14.0 (H) 03/12/2019  ? HGBA1C >14.0 (H) 02/25/2018  ?    ? ? Diabetes  Self-Management Education - 05/23/21 0900   ? ?  ? Visit Information  ? Visit Type Follow-up   ?  ? Health Coping  ? How would you rate your overall health? Poor   neuropathy very painful, prevents her from doing the things she needs to do. she wants to work, but it is preventing her from working as well.  ?  ? Psychosocial Assessment  ? Patient Belief/Attitude about Diabetes Defeat/Burnout   ? Self-care barriers Debilitated state due to current medical condition;Lack of transportation;Lack of child care;Lack of material resources   ? Self-management support None   ? Patient Concerns Glycemic Control;Support   ? Special Needs None   ? Preferred Learning Style Auditory   ? Learning Readiness Contemplating   ? How often do you need to have someone help you when you read instructions, pamphlets, or other written materials from your doctor or pharmacy? 3 - Sometimes   ? What is the last grade level you completed in school? 12   ?  ? Pre-Education Assessment  ? Patient understands the diabetes disease and treatment process. Demonstrates understanding / competency   ? Patient understands incorporating nutritional management into lifestyle. Demonstrates understanding / competency   ? Patient undertands incorporating physical activity into lifestyle. Demonstrates understanding / competency   ? Patient understands using medications safely. Needs Review   ? Patient understands  monitoring blood glucose, interpreting and using results Needs Review   ? Patient understands prevention, detection, and treatment of acute complications. Needs Review   ? Patient understands prevention, detection, and treatment of chronic complications. Needs Review   ? Patient understands how to develop strategies to address psychosocial issues. Needs Review   ? Patient understands how to develop strategies to promote health/change behavior. Demonstrates understanding / competency   ?  ? Complications  ? Last HgB A1C per patient/outside source 14 %    ? How often do you check your blood sugar? 0 times/day (not testing)   ? Have you had a dilated eye exam in the past 12 months? No   ? Have you had a dental exam in the past 12 months? No   ? Are you checking your feet? No   ?  ? Exercise  ? Exercise Type ADL's   having a difficult time doing basics because of pain in feet  ?  ? Patient Education  ? Previous Diabetes Education Yes (please comment)   ?  ? Subsequent Visit  ? Since your last visit have you continued or begun to take your medications as prescribed? No   she is unsure what 2 pills she is taking. one is a yellow/orange pill that may be synjardy  ? Since your last visit have you experienced any weight changes? No change   143# 150# used to be her average  ? Since your last visit, are you checking your blood glucose at least once a day? No   300 last time she checked  ? ?  ?  ? ?  ? ? ?Individualized Plan for Diabetes Self-Management Training:  ? ?Learning Objective:  Patient will have a greater understanding of diabetes self-management. ?Patient education plan is to attend individual and/or group sessions per assessed needs and concerns. ?  ?Plan:  ? ?There are no Patient Instructions on file for this visit. ? ?Expected Outcomes:    ? ?Education material provided: Diabetes Resources ? ?If problems or questions, patient to contact team via:  Phone ? ?Future DSME appointment:  1 week by telehealth ?Debera Lat, RD ?05/23/2021 ?10:15 AM. ? ?T ? ?

## 2021-05-23 NOTE — Assessment & Plan Note (Signed)
Seen via telehealth visit today. Patient spoke with Ashley Vaughn today about her diabetes, not able to check her sugars because the lancets she has do not work. She is taking her synjardy but no other diabetes meds at this time. Given her neuropathy is bad she is open to restarting insulin. ? ?A/P ?- start lantus 30 units daily ?- continue synjardy ?- restart duloxetine 60 mg daily for neuropathy ?- f/u in 2-4 weeks ?

## 2021-05-23 NOTE — Assessment & Plan Note (Signed)
Patient reports taking her HIV medication ?

## 2021-05-23 NOTE — Progress Notes (Signed)
Request lancet device/kit per patient who states her is broken. ?

## 2021-05-24 NOTE — Progress Notes (Signed)
Internal Medicine Clinic Attending ° °Case discussed with Dr. DeMaio  At the time of the visit.  We reviewed the resident’s history and exam and pertinent patient test results.  I agree with the assessment, diagnosis, and plan of care documented in the resident’s note. ° ° °

## 2021-05-31 ENCOUNTER — Ambulatory Visit: Payer: Medicaid Other | Admitting: Dietician

## 2021-05-31 ENCOUNTER — Other Ambulatory Visit: Payer: Self-pay | Admitting: Dietician

## 2021-05-31 DIAGNOSIS — E1140 Type 2 diabetes mellitus with diabetic neuropathy, unspecified: Secondary | ICD-10-CM

## 2021-05-31 MED ORDER — BD PEN NEEDLE MINI U/F 31G X 5 MM MISC: 3 refills | Status: DC

## 2021-05-31 NOTE — Telephone Encounter (Addendum)
Pharmacy says they are waiting on patient to call to schedule delivery of her medicines. She needs pen needles to take her insulin. Will request. ? Patient called and message left asking her to call [facility].  ?

## 2021-05-31 NOTE — Progress Notes (Signed)
This is a follow up call to last week's call about her diabetes self care.  ?I connected with  Ashley Vaughn on 05/31/21 by a  telemedicine application and verified that I am speaking with the correct person using two identifiers. ?  ?I discussed the limitations of evaluation and management by telemedicine. The patient expressed understanding and agreed to proceed. ?  ?She has not gotten  the lancing device or anything else form the mail order pharmacy. She has not called them to see where it is.  ?Blood sugars- has no idea what they are ? Neuropathy- has not gotten medicine, mostly hurts at night ? She states she takes one Yellow and one white pill- takes only one of each daily and her HIV med ?She thinks she will be abel to come in once the 4 children she is watching go back to school next week. Coordinate with id appointment in May. CDCES to call mail order pharmacy to find out what happened to her prescriptions.  ?Norm Parcel, RD ?05/31/2021 ?12:17 PM. ? ?

## 2021-06-18 ENCOUNTER — Telehealth: Payer: Self-pay | Admitting: *Deleted

## 2021-06-18 NOTE — Telephone Encounter (Signed)
Pharmacist Shanice at [facility] called for  med reconciliation, as Ashley Vaughn brought in both Jardiance 25 mg 1 daily (prescribed at [facility]) in the past couple of months) as well as Synjardy prescribed through our clinic.  She states that she has been taking both.  She is also taking Lantus.  We do not have a record of Jardiance prescriptions and I will call [facility] to determine if we are engaging unwittingly in duplicative services. ? ?Left mssg requesting a return call for triage nurse there ([facility]) at 4:28 pm.   ?

## 2021-06-18 NOTE — Telephone Encounter (Signed)
Call from [place] at [facility].  Patient is there and nurse would like to know if patient is taking [place] and [place].   If she is on both is there a reason why?   Shanice can be reached at [phone]. ?

## 2021-06-22 ENCOUNTER — Ambulatory Visit (INDEPENDENT_AMBULATORY_CARE_PROVIDER_SITE_OTHER): Payer: Medicaid Other | Admitting: Infectious Diseases

## 2021-06-22 ENCOUNTER — Other Ambulatory Visit: Payer: Self-pay

## 2021-06-22 ENCOUNTER — Telehealth: Payer: Self-pay

## 2021-06-22 DIAGNOSIS — E1165 Type 2 diabetes mellitus with hyperglycemia: Secondary | ICD-10-CM | POA: Diagnosis not present

## 2021-06-22 DIAGNOSIS — E1140 Type 2 diabetes mellitus with diabetic neuropathy, unspecified: Secondary | ICD-10-CM | POA: Diagnosis not present

## 2021-06-22 DIAGNOSIS — B20 Human immunodeficiency virus [HIV] disease: Secondary | ICD-10-CM | POA: Diagnosis not present

## 2021-06-22 DIAGNOSIS — Z9049 Acquired absence of other specified parts of digestive tract: Secondary | ICD-10-CM | POA: Diagnosis not present

## 2021-06-22 HISTORY — DX: Acquired absence of other specified parts of digestive tract: Z90.49

## 2021-06-22 NOTE — Assessment & Plan Note (Signed)
She appears to be convalescing.  ?My great appreciation to the excellent care of the IMTS.  ?

## 2021-06-22 NOTE — Telephone Encounter (Signed)
Left voice mail to call back to schedule a follow up for Dr. Ninetta Lights around [place] ?

## 2021-06-22 NOTE — Assessment & Plan Note (Signed)
Her latest FSG have been: 100 (this AM), usually 98-104.  ?Will get her back in to the excellent care of the IMTS.  ?

## 2021-06-22 NOTE — Progress Notes (Signed)
? ?  Subjective:  ? ? Patient ID: Ashley Vaughn, female  DOB: Oct 03, 1965, 56 y.o.        MRN: [id number] ? ? ?HPI ?56 yo F with hx of DM2 (dx 2002, on insulin) and HIV+ (dx Oct 2010). On complera 12-2010, changed to odefsy 02-2015. Was taking with food.  ?She is interested in [place]. Concerned that her family will be looking into her rx. Has occas missed doses.  ? ?She was d/c from hospital 06-19-21 after lap appendectomy. Her wound is healing. ?She states she does not feel better- has foot swelling, worsening neuropathy.  ?She is out of work.  ?States she was off her meds in hospital. She states "needs to catch up on her medicines".  ?She also has concerns about her DME. ?"I'm doing pretty good overall".  ? ?HIV 1 RNA Quant  ?Date Value  ?05/04/2021 Not Detected Copies/mL  ?03/16/2021 1,740 copies/mL (H)  ?09/08/2020 Not Detected Copies/mL  ? ?CD4 T Cell Abs (/uL)  ?Date Value  ?09/06/2019 1,491  ?03/12/2019 1,208  ?02/25/2018 1,350  ? ? ? ?Health Maintenance  ?Topic Date Due  ? OPHTHALMOLOGY EXAM  Never done  ? Zoster Vaccines- Shingrix (1 of 2) Never done  ? COLONOSCOPY (Pts 45-58yrs Insurance coverage will need to be confirmed)  Never done  ? MAMMOGRAM  02/26/2012  ? PAP SMEAR-Modifier  01/21/2013  ? COVID-19 Vaccine (2 - Moderna risk series) 01/17/2020  ? LIPID PANEL  09/05/2020  ? TETANUS/TDAP  06/30/2021  ? HEMOGLOBIN A1C  08/09/2021  ? INFLUENZA VACCINE  09/18/2021  ? FOOT EXAM  12/21/2021  ? Hepatitis C Screening  Completed  ? HIV Screening  Completed  ? HPV VACCINES  Aged Out  ? ? ? ? ?Review of Systems  ?Constitutional:  Positive for chills. Negative for fever.  ?Respiratory:  Negative for cough and shortness of breath.   ?Gastrointestinal:  Positive for abdominal pain (improved, slight) and constipation. Negative for diarrhea, nausea and vomiting.  ?Genitourinary:  Negative for dysuria.  ?Neurological:  Positive for sensory change.  ? ?Please see HPI. All other systems reviewed and negative. ? ?   ?Objective:   ?Physical Exam ? ?1. Due to the national emergency this service was provided using telemedicine.  phone.  ?2. Consent from the patient for the telehealth visit and that you identified patient : name, dob  ?3. Your locations, Pt and Provider home and [facility]  ?4. Chief complaint for visit abd pain.   ?5. Document anyone else on the call: neighbor  ?6. If the visit was a phone call, that you include the time you spent on the call: 10 minutes.  ? ? ? ? ? ?   ?Assessment & Plan:  ? ?

## 2021-06-22 NOTE — Assessment & Plan Note (Signed)
Will get her back into clinic to see if we can assess her adherence to rx.  ?Check her labs at that time.  ?

## 2021-07-17 ENCOUNTER — Emergency Department (HOSPITAL_COMMUNITY)
Admission: EM | Admit: 2021-07-17 | Discharge: 2021-07-18 | Disposition: A | Discharge status: Home / Self Care | Payer: Medicaid Other | Source: Home / Self Care | Attending: Emergency Medicine | Admitting: Emergency Medicine

## 2021-07-17 DIAGNOSIS — I10 Essential (primary) hypertension: Secondary | ICD-10-CM | POA: Insufficient documentation

## 2021-07-17 DIAGNOSIS — Z21 Asymptomatic human immunodeficiency virus [HIV] infection status: Secondary | ICD-10-CM | POA: Insufficient documentation

## 2021-07-17 DIAGNOSIS — Z7984 Long term (current) use of oral hypoglycemic drugs: Secondary | ICD-10-CM | POA: Insufficient documentation

## 2021-07-17 DIAGNOSIS — E1165 Type 2 diabetes mellitus with hyperglycemia: Secondary | ICD-10-CM | POA: Insufficient documentation

## 2021-07-17 DIAGNOSIS — R55 Syncope and collapse: Secondary | ICD-10-CM | POA: Insufficient documentation

## 2021-07-17 DIAGNOSIS — R739 Hyperglycemia, unspecified: Secondary | ICD-10-CM

## 2021-07-17 DIAGNOSIS — T8143XA Infection following a procedure, organ and space surgical site, initial encounter: Secondary | ICD-10-CM

## 2021-07-17 DIAGNOSIS — Z79899 Other long term (current) drug therapy: Secondary | ICD-10-CM | POA: Insufficient documentation

## 2021-07-17 DIAGNOSIS — R1031 Right lower quadrant pain: Secondary | ICD-10-CM | POA: Insufficient documentation

## 2021-07-17 DIAGNOSIS — R000 Tachycardia, unspecified: Secondary | ICD-10-CM | POA: Insufficient documentation

## 2021-07-17 DIAGNOSIS — K6811 Postprocedural retroperitoneal abscess: Secondary | ICD-10-CM | POA: Insufficient documentation

## 2021-07-17 DIAGNOSIS — Z794 Long term (current) use of insulin: Secondary | ICD-10-CM | POA: Insufficient documentation

## 2021-07-17 NOTE — ED Triage Notes (Signed)
EMS reports Appendix removed 5/5, was rehospitalization on the 17th due to complication of draining tube on the right side. EMS reports still looks infected and is painful for the patient. Per EMS patient had syncopal episode at daughters house didn't hit head and denies blood thinners. Patient reports n/v. Patient also hyperglycemic at cbg 565 per EMS. Patient alert and oriented on arrival.

## 2021-07-18 ENCOUNTER — Inpatient Hospital Stay (HOSPITAL_COMMUNITY)
Admission: EM | Admit: 2021-07-18 | Discharge: 2021-07-27 | DRG: 064 | Disposition: A | Discharge status: Home Health | Payer: Medicaid Other | Attending: Internal Medicine | Admitting: Internal Medicine

## 2021-07-18 ENCOUNTER — Encounter: Payer: Self-pay | Admitting: Internal Medicine

## 2021-07-18 ENCOUNTER — Emergency Department (HOSPITAL_COMMUNITY): Payer: Medicaid Other

## 2021-07-18 ENCOUNTER — Other Ambulatory Visit: Payer: Self-pay

## 2021-07-18 ENCOUNTER — Encounter (HOSPITAL_COMMUNITY): Payer: Self-pay

## 2021-07-18 ENCOUNTER — Ambulatory Visit (INDEPENDENT_AMBULATORY_CARE_PROVIDER_SITE_OTHER): Payer: Medicaid Other | Admitting: Internal Medicine

## 2021-07-18 ENCOUNTER — Other Ambulatory Visit: Payer: Self-pay | Admitting: Internal Medicine

## 2021-07-18 VITALS — BP 142/83 | HR 88 | Temp 97.6°F | Ht 62.0 in | Wt 141.0 lb

## 2021-07-18 DIAGNOSIS — E1165 Type 2 diabetes mellitus with hyperglycemia: Secondary | ICD-10-CM

## 2021-07-18 DIAGNOSIS — T8143XA Infection following a procedure, organ and space surgical site, initial encounter: Secondary | ICD-10-CM | POA: Diagnosis present

## 2021-07-18 DIAGNOSIS — I639 Cerebral infarction, unspecified: Secondary | ICD-10-CM | POA: Diagnosis not present

## 2021-07-18 DIAGNOSIS — E876 Hypokalemia: Secondary | ICD-10-CM | POA: Diagnosis present

## 2021-07-18 DIAGNOSIS — Z79899 Other long term (current) drug therapy: Secondary | ICD-10-CM

## 2021-07-18 DIAGNOSIS — N179 Acute kidney failure, unspecified: Secondary | ICD-10-CM | POA: Diagnosis not present

## 2021-07-18 DIAGNOSIS — I6523 Occlusion and stenosis of bilateral carotid arteries: Secondary | ICD-10-CM | POA: Diagnosis not present

## 2021-07-18 DIAGNOSIS — Y836 Removal of other organ (partial) (total) as the cause of abnormal reaction of the patient, or of later complication, without mention of misadventure at the time of the procedure: Secondary | ICD-10-CM | POA: Diagnosis present

## 2021-07-18 DIAGNOSIS — B954 Other streptococcus as the cause of diseases classified elsewhere: Secondary | ICD-10-CM | POA: Diagnosis present

## 2021-07-18 DIAGNOSIS — D631 Anemia in chronic kidney disease: Secondary | ICD-10-CM | POA: Diagnosis not present

## 2021-07-18 DIAGNOSIS — E860 Dehydration: Secondary | ICD-10-CM | POA: Diagnosis present

## 2021-07-18 DIAGNOSIS — R188 Other ascites: Secondary | ICD-10-CM | POA: Diagnosis present

## 2021-07-18 DIAGNOSIS — I63131 Cerebral infarction due to embolism of right carotid artery: Secondary | ICD-10-CM | POA: Diagnosis not present

## 2021-07-18 DIAGNOSIS — K3533 Acute appendicitis with perforation and localized peritonitis, with abscess: Secondary | ICD-10-CM

## 2021-07-18 DIAGNOSIS — Z635 Disruption of family by separation and divorce: Secondary | ICD-10-CM

## 2021-07-18 DIAGNOSIS — E875 Hyperkalemia: Secondary | ICD-10-CM | POA: Diagnosis not present

## 2021-07-18 DIAGNOSIS — I959 Hypotension, unspecified: Secondary | ICD-10-CM | POA: Diagnosis not present

## 2021-07-18 DIAGNOSIS — D500 Iron deficiency anemia secondary to blood loss (chronic): Secondary | ICD-10-CM | POA: Diagnosis not present

## 2021-07-18 DIAGNOSIS — Z8249 Family history of ischemic heart disease and other diseases of the circulatory system: Secondary | ICD-10-CM

## 2021-07-18 DIAGNOSIS — B20 Human immunodeficiency virus [HIV] disease: Secondary | ICD-10-CM | POA: Diagnosis present

## 2021-07-18 DIAGNOSIS — Z9049 Acquired absence of other specified parts of digestive tract: Secondary | ICD-10-CM

## 2021-07-18 DIAGNOSIS — I5020 Unspecified systolic (congestive) heart failure: Secondary | ICD-10-CM | POA: Insufficient documentation

## 2021-07-18 DIAGNOSIS — R569 Unspecified convulsions: Secondary | ICD-10-CM | POA: Diagnosis not present

## 2021-07-18 DIAGNOSIS — Z90710 Acquired absence of both cervix and uterus: Secondary | ICD-10-CM

## 2021-07-18 DIAGNOSIS — T8140XA Infection following a procedure, unspecified, initial encounter: Secondary | ICD-10-CM

## 2021-07-18 DIAGNOSIS — R590 Localized enlarged lymph nodes: Secondary | ICD-10-CM | POA: Diagnosis present

## 2021-07-18 DIAGNOSIS — E1140 Type 2 diabetes mellitus with diabetic neuropathy, unspecified: Secondary | ICD-10-CM | POA: Diagnosis not present

## 2021-07-18 DIAGNOSIS — B964 Proteus (mirabilis) (morganii) as the cause of diseases classified elsewhere: Secondary | ICD-10-CM | POA: Diagnosis present

## 2021-07-18 DIAGNOSIS — N183 Chronic kidney disease, stage 3 unspecified: Secondary | ICD-10-CM | POA: Diagnosis not present

## 2021-07-18 DIAGNOSIS — Z7984 Long term (current) use of oral hypoglycemic drugs: Secondary | ICD-10-CM

## 2021-07-18 DIAGNOSIS — T8142XD Infection following a procedure, deep incisional surgical site, subsequent encounter: Secondary | ICD-10-CM | POA: Diagnosis not present

## 2021-07-18 DIAGNOSIS — I6389 Other cerebral infarction: Secondary | ICD-10-CM | POA: Diagnosis not present

## 2021-07-18 DIAGNOSIS — I10 Essential (primary) hypertension: Secondary | ICD-10-CM | POA: Diagnosis not present

## 2021-07-18 DIAGNOSIS — Z794 Long term (current) use of insulin: Secondary | ICD-10-CM | POA: Diagnosis not present

## 2021-07-18 DIAGNOSIS — R0683 Snoring: Secondary | ICD-10-CM | POA: Diagnosis present

## 2021-07-18 DIAGNOSIS — I130 Hypertensive heart and chronic kidney disease with heart failure and stage 1 through stage 4 chronic kidney disease, or unspecified chronic kidney disease: Secondary | ICD-10-CM | POA: Diagnosis present

## 2021-07-18 DIAGNOSIS — Z833 Family history of diabetes mellitus: Secondary | ICD-10-CM

## 2021-07-18 DIAGNOSIS — I440 Atrioventricular block, first degree: Secondary | ICD-10-CM | POA: Diagnosis present

## 2021-07-18 DIAGNOSIS — E1122 Type 2 diabetes mellitus with diabetic chronic kidney disease: Secondary | ICD-10-CM | POA: Diagnosis present

## 2021-07-18 DIAGNOSIS — I63411 Cerebral infarction due to embolism of right middle cerebral artery: Secondary | ICD-10-CM | POA: Diagnosis not present

## 2021-07-18 DIAGNOSIS — Z7722 Contact with and (suspected) exposure to environmental tobacco smoke (acute) (chronic): Secondary | ICD-10-CM | POA: Diagnosis present

## 2021-07-18 DIAGNOSIS — D509 Iron deficiency anemia, unspecified: Secondary | ICD-10-CM | POA: Diagnosis present

## 2021-07-18 DIAGNOSIS — F32A Depression, unspecified: Secondary | ICD-10-CM | POA: Diagnosis present

## 2021-07-18 DIAGNOSIS — I255 Ischemic cardiomyopathy: Secondary | ICD-10-CM | POA: Diagnosis not present

## 2021-07-18 DIAGNOSIS — I420 Dilated cardiomyopathy: Secondary | ICD-10-CM | POA: Diagnosis not present

## 2021-07-18 DIAGNOSIS — D75838 Other thrombocytosis: Secondary | ICD-10-CM | POA: Diagnosis not present

## 2021-07-18 DIAGNOSIS — I5021 Acute systolic (congestive) heart failure: Secondary | ICD-10-CM | POA: Diagnosis not present

## 2021-07-18 DIAGNOSIS — I429 Cardiomyopathy, unspecified: Secondary | ICD-10-CM | POA: Diagnosis not present

## 2021-07-18 DIAGNOSIS — E119 Type 2 diabetes mellitus without complications: Secondary | ICD-10-CM | POA: Diagnosis not present

## 2021-07-18 DIAGNOSIS — Z91148 Patient's other noncompliance with medication regimen for other reason: Secondary | ICD-10-CM

## 2021-07-18 DIAGNOSIS — L0291 Cutaneous abscess, unspecified: Secondary | ICD-10-CM | POA: Diagnosis present

## 2021-07-18 DIAGNOSIS — Z91041 Radiographic dye allergy status: Secondary | ICD-10-CM

## 2021-07-18 DIAGNOSIS — E139 Other specified diabetes mellitus without complications: Secondary | ICD-10-CM | POA: Diagnosis not present

## 2021-07-18 DIAGNOSIS — Z8719 Personal history of other diseases of the digestive system: Secondary | ICD-10-CM

## 2021-07-18 DIAGNOSIS — I6340 Cerebral infarction due to embolism of unspecified cerebral artery: Secondary | ICD-10-CM | POA: Diagnosis not present

## 2021-07-18 DIAGNOSIS — T8149XA Infection following a procedure, other surgical site, initial encounter: Secondary | ICD-10-CM | POA: Diagnosis not present

## 2021-07-18 HISTORY — DX: Infection following a procedure, unspecified, initial encounter: T81.40XA

## 2021-07-18 HISTORY — DX: Unspecified convulsions: R56.9

## 2021-07-18 LAB — COMPREHENSIVE METABOLIC PANEL
ALT: 16 U/L (ref 0–44)
AST: 30 U/L (ref 15–41)
Albumin: 2.4 g/dL — ABNORMAL LOW (ref 3.5–5.0)
Alkaline Phosphatase: 134 U/L — ABNORMAL HIGH (ref 38–126)
Anion gap: 11 (ref 5–15)
BUN: 13 mg/dL (ref 6–20)
CO2: 22 mmol/L (ref 22–32)
Calcium: 8.4 mg/dL — ABNORMAL LOW (ref 8.9–10.3)
Chloride: 104 mmol/L (ref 98–111)
Creatinine, Ser: 1.11 mg/dL — ABNORMAL HIGH (ref 0.44–1.00)
GFR, Estimated: 59 mL/min — ABNORMAL LOW (ref >60)
Glucose, Bld: 509 mg/dL (ref 70–99)
Potassium: 4 mmol/L (ref 3.5–5.1)
Sodium: 137 mmol/L (ref 135–145)
Total Bilirubin: 0.6 mg/dL (ref 0.3–1.2)
Total Protein: 6.7 g/dL (ref 6.5–8.1)

## 2021-07-18 LAB — BASIC METABOLIC PANEL
Anion gap: 10 (ref 5–15)
BUN: 9 mg/dL (ref 6–20)
CO2: 22 mmol/L (ref 22–32)
Calcium: 8.7 mg/dL — ABNORMAL LOW (ref 8.9–10.3)
Chloride: 108 mmol/L (ref 98–111)
Creatinine, Ser: 0.89 mg/dL (ref 0.44–1.00)
GFR, Estimated: >60 mL/min (ref >60)
Glucose, Bld: 324 mg/dL — ABNORMAL HIGH (ref 70–99)
Potassium: 3.2 mmol/L — ABNORMAL LOW (ref 3.5–5.1)
Sodium: 140 mmol/L (ref 135–145)

## 2021-07-18 LAB — I-STAT VENOUS BLOOD GAS, ED
Acid-Base Excess: 5 mmol/L — ABNORMAL HIGH (ref 0.0–2.0)
Bicarbonate: 27.6 mmol/L (ref 20.0–28.0)
Calcium, Ion: 1.11 mmol/L — ABNORMAL LOW (ref 1.15–1.40)
HCT: 26 % — ABNORMAL LOW (ref 36.0–46.0)
Hemoglobin: 8.8 g/dL — ABNORMAL LOW (ref 12.0–15.0)
O2 Saturation: 93 %
Potassium: 3.4 mmol/L — ABNORMAL LOW (ref 3.5–5.1)
Sodium: 139 mmol/L (ref 135–145)
TCO2: 29 mmol/L (ref 22–32)
pCO2, Ven: 31.9 mmHg — ABNORMAL LOW (ref 44–60)
pH, Ven: 7.544 — ABNORMAL HIGH (ref 7.25–7.43)
pO2, Ven: 56 mmHg — ABNORMAL HIGH (ref 32–45)

## 2021-07-18 LAB — I-STAT BETA HCG BLOOD, ED (MC, WL, AP ONLY): I-stat hCG, quantitative: <5 m[IU]/mL (ref <5)

## 2021-07-18 LAB — CBC
HCT: 28.8 % — ABNORMAL LOW (ref 36.0–46.0)
Hemoglobin: 8.4 g/dL — ABNORMAL LOW (ref 12.0–15.0)
MCH: 21.1 pg — ABNORMAL LOW (ref 26.0–34.0)
MCHC: 29.2 g/dL — ABNORMAL LOW (ref 30.0–36.0)
MCV: 72.2 fL — ABNORMAL LOW (ref 80.0–100.0)
Platelets: 504 10*3/uL — ABNORMAL HIGH (ref 150–400)
RBC: 3.99 MIL/uL (ref 3.87–5.11)
RDW: 16.8 % — ABNORMAL HIGH (ref 11.5–15.5)
WBC: 7.2 10*3/uL (ref 4.0–10.5)
nRBC: 0 % (ref 0.0–0.2)

## 2021-07-18 LAB — CBC WITH DIFFERENTIAL/PLATELET
Abs Immature Granulocytes: 0.04 10*3/uL (ref 0.00–0.07)
Basophils Absolute: 0.1 10*3/uL (ref 0.0–0.1)
Basophils Relative: 1 %
Eosinophils Absolute: 0.1 10*3/uL (ref 0.0–0.5)
Eosinophils Relative: 1 %
HCT: 25.5 % — ABNORMAL LOW (ref 36.0–46.0)
Hemoglobin: 8 g/dL — ABNORMAL LOW (ref 12.0–15.0)
Immature Granulocytes: 0 %
Lymphocytes Relative: 35 %
Lymphs Abs: 3.4 10*3/uL (ref 0.7–4.0)
MCH: 22.2 pg — ABNORMAL LOW (ref 26.0–34.0)
MCHC: 31.4 g/dL (ref 30.0–36.0)
MCV: 70.8 fL — ABNORMAL LOW (ref 80.0–100.0)
Monocytes Absolute: 0.7 10*3/uL (ref 0.1–1.0)
Monocytes Relative: 7 %
Neutro Abs: 5.3 10*3/uL (ref 1.7–7.7)
Neutrophils Relative %: 56 %
Platelets: 515 10*3/uL — ABNORMAL HIGH (ref 150–400)
RBC: 3.6 MIL/uL — ABNORMAL LOW (ref 3.87–5.11)
RDW: 16.4 % — ABNORMAL HIGH (ref 11.5–15.5)
WBC: 9.5 10*3/uL (ref 4.0–10.5)
nRBC: 0 % (ref 0.0–0.2)

## 2021-07-18 LAB — CBG MONITORING, ED
Glucose-Capillary: 198 mg/dL — ABNORMAL HIGH (ref 70–99)
Glucose-Capillary: 314 mg/dL — ABNORMAL HIGH (ref 70–99)
Glucose-Capillary: 432 mg/dL — ABNORMAL HIGH (ref 70–99)
Glucose-Capillary: 526 mg/dL (ref 70–99)

## 2021-07-18 LAB — TROPONIN I (HIGH SENSITIVITY)
Troponin I (High Sensitivity): 10 ng/L (ref <18)
Troponin I (High Sensitivity): 16 ng/L (ref <18)

## 2021-07-18 LAB — MAGNESIUM: Magnesium: 1.9 mg/dL (ref 1.7–2.4)

## 2021-07-18 MED ORDER — ACCU-CHEK NANO SMARTVIEW W/DEVICE KIT: 1.0000 | PACK | Freq: Two times a day (BID) | 0 refills | Status: DC

## 2021-07-18 MED ORDER — LANTUS SOLOSTAR 100 UNIT/ML ~~LOC~~ SOPN: 30.0000 [IU] | PEN_INJECTOR | Freq: Every day | SUBCUTANEOUS | 3 refills | Status: DC

## 2021-07-18 MED ORDER — SODIUM CHLORIDE 0.9 % IV SOLN
1000.0000 mL | INTRAVENOUS | Status: DC
  Administered 2021-07-18: 1000 mL via INTRAVENOUS

## 2021-07-18 MED ORDER — ACCU-CHEK SOFTCLIX LANCET DEV KIT: PACK | 1 refills | Status: DC

## 2021-07-18 MED ORDER — BD PEN NEEDLE MINI U/F 31G X 5 MM MISC: 3 refills | Status: DC

## 2021-07-18 MED ORDER — ACCU-CHEK GUIDE VI STRP: ORAL_STRIP | 3 refills | Status: DC

## 2021-07-18 MED ORDER — ONDANSETRON 4 MG PO TBDP: 4.0000 mg | ORAL_TABLET | Freq: Three times a day (TID) | ORAL | 0 refills | Status: DC | PRN

## 2021-07-18 MED ORDER — SODIUM CHLORIDE 0.9 % IV BOLUS (SEPSIS)
1000.0000 mL | Freq: Once | INTRAVENOUS | Status: AC
  Administered 2021-07-18: 1000 mL via INTRAVENOUS

## 2021-07-18 MED ORDER — IOHEXOL 300 MG/ML  SOLN
100.0000 mL | Freq: Once | INTRAMUSCULAR | Status: AC | PRN
  Administered 2021-07-18: 100 mL via INTRAVENOUS

## 2021-07-18 MED ORDER — LEVETIRACETAM IN NACL 1500 MG/100ML IV SOLN
1500.0000 mg | Freq: Once | INTRAVENOUS | Status: AC
  Administered 2021-07-18: 1500 mg via INTRAVENOUS
  Filled 2021-07-18: qty 100

## 2021-07-18 MED ORDER — ODEFSEY 200-25-25 MG PO TABS: 1.0000 | ORAL_TABLET | Freq: Every day | ORAL | 5 refills | Status: DC

## 2021-07-18 MED ORDER — LACTATED RINGERS IV BOLUS
1000.0000 mL | Freq: Once | INTRAVENOUS | Status: AC
  Administered 2021-07-18: 1000 mL via INTRAVENOUS

## 2021-07-18 MED ORDER — INSULIN ASPART 100 UNIT/ML IJ SOLN
8.0000 [IU] | Freq: Once | INTRAMUSCULAR | Status: AC
  Administered 2021-07-18: 8 [IU] via SUBCUTANEOUS

## 2021-07-18 MED ORDER — SYNJARDY 5-500 MG PO TABS
1.0000 | ORAL_TABLET | Freq: Two times a day (BID) | ORAL | 1 refills | Status: DC
Start: 2021-07-18 — End: 2021-07-27

## 2021-07-18 MED ORDER — LORAZEPAM 2 MG/ML IJ SOLN
INTRAMUSCULAR | Status: AC
  Administered 2021-07-18: 1 mg
  Filled 2021-07-18: qty 1

## 2021-07-18 NOTE — Assessment & Plan Note (Signed)
HIV medications refilled today

## 2021-07-18 NOTE — Discharge Instructions (Signed)
Please pick up your insulin from home and take as directed. Continue your antibiotic regimen and follow up with surgery.

## 2021-07-18 NOTE — ED Notes (Signed)
Blood sent to the lab at this time by this RN. Unsuccessful IV stick x2.

## 2021-07-18 NOTE — Progress Notes (Signed)
   CC: abdominal pain  HPI:  Ashley Vaughn is a 56 y.o. PMH noted below, who presents to the [facility] with complaints of abdominal pain. To see the management of his acute and chronic conditions, please refer to the A&P note under the encounters tab.   Past Medical History:  Diagnosis Date   Depression    Diabetes mellitus    History of positive PPD, treatment status unknown    HIV infection (HCC)    Review of Systems:  positive for abdominal pain, nausea; negative for fever, chills, chest pain  Physical Exam: Gen: chronically ill appearing woman in NAD HEENT: normocephalic atraumatic, MMM, neck supple CV: RRR, no m/r/g   Resp: CTAB, normal WOB  GI: soft, tender to palpation near drain insertion, drain c/d/I with dressings replaced MSK: moves all extremities without difficulty Skin:warm and dry Neuro:alert answering questions appropriately Psych: normal affect   Assessment & Plan:   See Encounters Tab for problem based charting.  Patient discussed with Dr. Oswaldo Done

## 2021-07-18 NOTE — ED Notes (Signed)
Daughter called to update that pt is up for discharge. Pt has appt with PCP downstairs at 830. Daughter to pick up pt from appt and RN will take pt to office.

## 2021-07-18 NOTE — ED Notes (Signed)
Witnessed seizure activity by Dr Anitra Lauth lasting less than 1 minute.  Patient stated to Dr Anitra Lauth something to the effect she was going to have a seizure and she was trying to prevent it.

## 2021-07-18 NOTE — Assessment & Plan Note (Signed)
Patient reports not having taken her insulin for the last two months. Says that she has only gotten insulin when she was at the hospital. Sugars have been in the 5-600s presenting at the ER. The reason she has not taken her medications is because she says that they got lost when someone (potentially a landlord) moved stuff out of her house. Lots of psychosocial stressors making care difficult. She does not have any of her meter or sugar checking supplies. Ideally patient would be on mealtime insulin as well as long acting but will try and continue a simple regimen at this time with close follow up. Finances remain a significant barrier. Discussed with patient that adherence would be essential in helping her heal from this infection. Also spoke with patient's daughter who she is living with temporarily (daughter is a CMA). - 30 units long acting insulin - blood sugar supplies sent - synjardy refill BID - 2 week close follow up.

## 2021-07-18 NOTE — Assessment & Plan Note (Addendum)
Patient last seen in the ER last night. CTAP showed abscess with drain in place. Normal WBC and no signs of sepsis at that time. Patient has been taking her augmentin and her cipro as directed. A large part of healing/recovering from this infection will be to make sure she has her sugars under control. - continue cipro and augmentin - surgical/IR follow up per discharge summary was to be scheduled 10-14 days after discharging on the 25th. Called and got this appointment scheduled for patient - see diabetes plan for further management of sugars

## 2021-07-18 NOTE — ED Triage Notes (Signed)
Pt BIB [facility] from home with c/o seizure-like activity. Pt reports a witnessed seizure last night & EMS reports witnessing while on scene & upon arrival to ED she had another one. Pts vision drew to the Left side & her arm extended out & for approx. 20 seconds she pulsed her arm up & down. Pt was able to verbalize understanding & never lost the ability to hear. EMS reports CBG of 410, initial BP of 210/120 & it came down to 152/96, 100 % on RA- LS are clear, 26 bpm, capnography of 34, afebrile. Pt also endorses a decreased appetite & a drain is still in place from her appendectomy on 06/22/21.

## 2021-07-18 NOTE — Assessment & Plan Note (Signed)
BP elevated today at 142/83 in the setting of acute abdominal pain. Will reassess at next visit.  - continue hyzaar

## 2021-07-18 NOTE — Consult Note (Addendum)
Neurology Consultation Reason for Consult: Focal seizure Requesting Physician: Blanchie Dessert  CC: Seizures  History is obtained from: Patient and chart review  HPI: Ashley Vaughn is a 56 y.o. female with a past medical history significant for hypertension, very poorly controlled diabetes (A1c greater than 14% 05/09/2021), HIV (well controlled), HSV, recent laparoscopic appendectomy complicated by abscess with drain in place  Most of the history is given by ED provider given the patient is quite sleepy and a little reluctant to participate in my examination saying "lady stop I've  been through a lot" she does tell me that she thinks around her 51th birthday she may have had a low CD4 count, but otherwise her HIV has been well controlled recently.  She denies any recent signs or symptoms of infection other than the abdominal abscess which she reports is doing well.  She denies any headache or vision changes, she is reluctant to turn her head and eyes from side to side because she fears this may trigger a seizure  She had a syncopal episode on 5/30, preceded by some nausea/vomiting associated with some shaking activity without tongue biting or urinary incontinence.  Her ED course was reassuring and she was discharged after normalization of her glucose.  She then presented again to the ED 5/31 with several witnessed seizures at home, described as versive head turn to the left, shaking of the left arm and rigidity of the right arm with retained consciousness.  She subsequently had another event witnessed by Dr. Maryan Rued with brief loss of consciousness for which she was given Ativan and Keppra 1500 mg and neurology was consulted  ROS: Limited by patient somnolence   Past Medical History:  Diagnosis Date   Depression    Diabetes mellitus    History of positive PPD, treatment status unknown    HIV infection ([place])    Past Surgical History:  Procedure Laterality Date   ABDOMINAL HYSTERECTOMY      Please see additional pertinent medical and surgical history as listed in HPI above  Current Outpatient Medications  Medication Instructions   Accu-Chek Softclix Lancets lancets Check blood sugar 3 times per day   amoxicillin-clavulanate (AUGMENTIN) 875-125 MG tablet 1 tablet, Oral, See admin instructions, Bid x 14 days   B-D UF III MINI PEN NEEDLES 31G X 5 MM MISC Use to inject insulin one time daily   Blood Glucose Monitoring Suppl (ACCU-CHEK GUIDE ME) w/Device KIT 1 kit, Does not apply, Daily   Blood Glucose Monitoring Suppl (ACCU-CHEK NANO SMARTVIEW) w/Device KIT 1 each, Does not apply, 2 times daily, Check blood sugar 4x a day before meals and bedtime  2 days each week. Dx code- 250.00.   ciprofloxacin (CIPRO) 750 mg, Oral, See admin instructions, Bid x 14 days   DULoxetine (CYMBALTA) 60 mg, Oral, Daily   Empagliflozin-metFORMIN HCl (SYNJARDY) 5-500 MG TABS 1 tablet, Oral, 2 times daily   emtricitabine-rilpivir-tenofovir AF (ODEFSEY) 200-25-25 MG TABS tablet 1 tablet, Oral, Daily with breakfast   glucose blood (ACCU-CHEK GUIDE) test strip Check blood sugar 3 times per day   Lancets Misc. (ACCU-CHEK SOFTCLIX LANCET DEV) KIT Use to check blood sugar up to 3 times a day before meals.   Lantus SoloStar 30 Units, Subcutaneous, Daily   losartan-hydrochlorothiazide (HYZAAR) 50-12.5 MG tablet 1 tablet, Oral, Daily   ondansetron (ZOFRAN-ODT) 4 mg, Oral, Every 8 hours PRN   Vitamin D3 5,000 Units, Oral, Daily     Family History  Problem Relation Age of Onset  Diabetes Mother    Hypertension Mother    Heart disease Father    Arthritis Father    Diabetes Maternal Uncle     Social History:  reports that she has never smoked. She has been exposed to tobacco smoke. She has never used smokeless tobacco. She reports current alcohol use. She reports that she does not use drugs.   Exam: Current vital signs: BP (!) 156/81   Pulse 94   Temp 98.3 F (36.8 C) (Oral)   Resp 16   SpO2 100%   Vital signs in last 24 hours: Temp:  [97.6 F (36.4 C)-99.2 F (37.3 C)] 98.3 F (36.8 C) (05/31 2330) Pulse Rate:  [69-113] 94 (05/31 2330) Resp:  [6-22] 16 (05/31 2330) BP: (142-182)/(76-100) 156/81 (05/31 2330) SpO2:  [97 %-100 %] 100 % (05/31 2330) Weight:  [64 kg] 64 kg (05/31 0830)   Physical Exam  Constitutional: Appears fatigued Psych: Affect guarded but cooperative Eyes: No scleral injection HENT: No oropharyngeal obstruction.  Tongue bite on the right.  No nuchal rigidity MSK: no joint deformities.  Cardiovascular: Normal rate and regular rhythm.  Respiratory: Effort normal, non-labored breathing GI: Soft.  No distension.  Drain in place Skin: Warm dry and intact visible skin  Neuro: Mental Status: Patient is awake, alert, oriented to person, place, month, year, and situation. She gives limited history, but no evidence of aphasia or clear neglect Cranial Nerves: II: Visual Fields are full. Pupils are equal, round, and reactive to light.  No photophobia III,IV, VI: Left gaze preference, but she is able to look to the right with encouragement V: Facial sensation is symmetric to temperature VII: Facial movement is symmetric.  VIII: hearing is intact to voice X: Uvula elevates symmetrically XI: Shoulder shrug is symmetric. XII: tongue is midline without atrophy or fasciculations.  Motor: Tone is normal. Bulk is normal.  She does not participate well with confrontational strength testing, 4/5 throughout no clear focal weakness Sensory: Reports sensation is intact and symmetric in the arms and legs Deep Tendon Reflexes: 2+ and symmetric in the brachioradialis and patellae.  Plantars: Toes are downgoing bilaterally.  Cerebellar: FNF and HKS are intact bilaterally Gait:  Deferred in acute setting    I have reviewed labs in epic and the results pertinent to this consultation are: CBC with anemia to 8.4 (11.4 in March, preoperatively), platelets 504, white blood  cell count 7.2 CMP notable for mild hypokalemia to 3.2, hyperglycemia 324, pseudo hypocalcemia (8.7 in the setting of an albumin of 2.4)  GFR 59 Troponin downtrending from 509 to 324 Beta-hCG negative  Lab Results  Component Value Date   HGBA1C >14.0 (A) 05/09/2021   Lab Results  Component Value Date   CHOL 208 (H) 09/06/2019   HDL 42 (L) 09/06/2019   LDLCALC 127 (H) 09/06/2019   TRIG 244 (H) 09/06/2019   CHOLHDL 5.0 (H) 09/06/2019    I have reviewed the images obtained:  Head CT 07/18/2021 personally reviewed, agree with radiology no evidence of acute intracranial process  CT abdomen pelvis with contrast: 1. Patient is status post appendectomy. Enhancing fluid collection containing air in the right paracolic gutter extending into the adjacent intramuscular wall consistent with abscess. Percutaneous drainage catheter in place within this collection. 2. No evidence for bowel obstruction.  CXR: No active disease.  Impression: Focal seizures have an 80% recurrence risk even in the setting of a single episode, and therefore this patient will merit long-term antiseizure medications.  Given her history of  HIV, work-up should include MRI brain with and without contrast, followed by lumbar puncture, and EEG.  However at this time, patient is adamantly refusing lumbar puncture, and overall I am reassured given her recent undetectable viral load, normal CD4 count, lack of fever, and lack of meningitic signs on examination.  She has several potential triggering factors including abscess (although this appears to be well treated), electrolyte derangements including hypokalemia, hyperglycemia.  Given her ongoing left gaze on my examination, I think she would benefit from a higher load of Keppra.   Recommendations: -MRI brain with and without contrast -Replete potassium (20 mEq ordered) -check magnesium (ordered) and replete if needed -UA, UDS -Keppra 1500 mg load in the ED, given additional  1000 mg now followed by 1000 mg twice daily -Goal euglycemia, every 4 hour glucose checks with moderate dose sliding scale insulin ordered, appreciate adjustments per primary team -EEG -Neurology will continue to follow in consultation, appreciate management of comorbidities by primary team  Lesleigh Noe MD-PhD [facility] [phone] Available 7 PM to 7 AM, outside of these hours please call Neurologist on call as listed on Amion.   Addendum:   MRI brain personally reviewed, agree with radiology: 1. 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 No associated hemorrhage or mass effect. 2. No other acute intracranial abnormality. 3. Underlying mild chronic microvascular ischemic disease.   Seizure is a rare presentation for stroke, though not impossible more typical in children than in adults.  However I question whether these advance may have been limb shaking TIAs.  Will obtain CTA to evaluate for possible critical right carotid or MCA occlusion.  As well as echocardiogram.  Given not all diffusion restriction lesions are stroke and again in this patient with HIV infection remains a possibility, will hold off on aspirin pending CTA confirmation that these episodes may represent limb shaking versus TIA, as she may still need LP if she becomes febrile or has further seizure-like-activity  NIH 1 for gaze preference, performed at approximately 11:30 PM 5/31, possibly 2 (for mild neglect difficult to assess given her limited participation in my aspects of my exam)  -CTA head and neck -ECHO -Hold off on ASA for now pending confirmation no LP needed  -Lipid panel, A1c  -Stroke  -Permissive hypertension to 220/110

## 2021-07-18 NOTE — ED Provider Notes (Signed)
[facility] EMERGENCY DEPARTMENT Provider Note  CSN: [id number] Arrival date & time: 07/17/21 2351  Chief Complaint(s) Near Syncope  HPI Ashley Vaughn is a 56 y.o. female with past medical history listed below including hypertension, diabetes, recent appendectomy complicated by intra-abdominal abscess requiring PERC drain and current antibiotics.  Patient presents for syncopal episode at home.  She reports needing to go to the bathroom for nausea and vomiting.  While being assisted by the daughter, the patient had a syncopal episode and was lowered to the ground.  There was no head trauma.  Daughter noted patient was shaking.  There was no tongue biting or urinary incontinence.  Shaking was not violent.  No prior history of seizures.  Other than her insulin the patient reports that she is compliant with all her other medications.  During the episode she denied any associated chest pain or shortness of breath.  No focal deficits.  No headache or lightheadedness.  The history is provided by the patient.   Past Medical History Past Medical History:  Diagnosis Date   Depression    Diabetes mellitus    History of positive PPD, treatment status unknown    HIV infection ([place])    Patient Active Problem List   Diagnosis Date Noted   S/P laparoscopic appendectomy 06/22/2021   Hypertension 12/21/2020   Fatigue 12/21/2020   HSV (herpes simplex virus) anogenital infection 03/12/2019   Bronchiolitis 09/10/2018   Cloudy urine 02/25/2018   Dyspareunia, female 02/25/2018   Screening examination for venereal disease 03/03/2014   Encounter for long-term (current) use of medications 03/03/2014   Health care maintenance 03/18/2012   Major depressive disorder, single episode, severe ([place]) 01/05/2010   Human immunodeficiency virus (HIV) disease ([place]) 10/13/2008   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 Authorizing Provider  amoxicillin-clavulanate (AUGMENTIN) 875-125 MG tablet Take 1 tablet by mouth See admin instructions. Bid x 14 days 07/12/21  Yes [provider]  Cholecalciferol (VITAMIN D3) 5000 units TABS Take 5,000 Units by mouth daily.   Yes [provider]  ciprofloxacin (CIPRO) 750 MG tablet Take 750 mg by mouth See admin instructions. Bid x 14 days 07/12/21  Yes [provider]  DULoxetine (CYMBALTA) 60 MG capsule Take 1 capsule (60 mg total) by mouth daily. 05/23/21 07/18/21 Yes Demaio, Alexa, MD  Empagliflozin-metFORMIN HCl (SYNJARDY) 5-500 MG TABS Take 1 tablet by mouth 2 (two) times daily. 03/16/21  Yes Campbell Riches, MD  JARDIANCE 25 MG TABS tablet Take 25 mg by mouth daily. 03/25/21  Yes [provider]  losartan-hydrochlorothiazide (HYZAAR) 50-12.5 MG tablet Take 1 tablet by mouth daily. 05/08/21  Yes Lajean Manes, MD  ondansetron (ZOFRAN-ODT) 4 MG disintegrating tablet Take 1 tablet (4 mg total) by mouth every 8 (eight) hours as needed for up to 3 days for nausea or vomiting. 07/18/21 07/21/21 Yes Daiya Tamer, Grayce Sessions, MD  Accu-Chek Softclix Lancets lancets Check blood sugar 3 times per day 05/23/21   Scarlett Presto, MD  B-D UF III MINI PEN NEEDLES 31G X 5 MM MISC Use to inject insulin one time daily 05/31/21   Demaio, Alexa, MD  Blood Glucose Monitoring Suppl (ACCU-CHEK GUIDE ME) w/Device KIT 1 kit by Does not apply route daily. Patient not taking: Reported on 03/16/2021 09/30/19   Kuppelweiser, Cassie L, RPH-CPP  Blood Glucose Monitoring Suppl (  ACCU-CHEK NANO SMARTVIEW) W/DEVICE KIT 1 each by Does not apply route 2 (two) times daily. Check blood sugar 4x a day before meals and bedtime  2 days each week. Dx code- 250.00. Patient not taking: Reported on 03/16/2021 08/28/11   Othella Boyer, MD  emtricitabine-rilpivir-tenofovir AF (ODEFSEY) 200-25-25 MG TABS tablet  Take 1 tablet by mouth daily with breakfast. Patient not taking: Reported on 07/18/2021 03/16/21   Campbell Riches, MD  glucose blood (ACCU-CHEK GUIDE) test strip Check blood sugar 3 times per day 05/10/21   Orvis Brill, MD  insulin glargine (LANTUS SOLOSTAR) 100 UNIT/ML Solostar Pen Inject 30 Units into the skin daily. Patient not taking: Reported on 07/18/2021 05/23/21 06/22/21  Scarlett Presto, MD  Lancets Misc. (ACCU-CHEK SOFTCLIX LANCET DEV) KIT Use to check blood sugar up to 3 times a day before meals. 05/23/21   Scarlett Presto, MD                                                                                                                                    Allergies Fluorescein  Review of Systems Review of Systems  Cardiovascular:  Positive for near-syncope.  As noted in HPI  Physical Exam Vital Signs  I have reviewed the triage vital signs BP (!) 154/84   Pulse 95   Temp 98.7 F (37.1 C) (Oral)   Resp 18   SpO2 98%   Physical Exam Vitals reviewed.  Constitutional:      General: She is not in acute distress.    Appearance: She is well-developed. She is not diaphoretic.  HENT:     Head: Normocephalic and atraumatic.     Nose: Nose normal.  Eyes:     General: No scleral icterus.       Right eye: No discharge.        Left eye: No discharge.     Conjunctiva/sclera: Conjunctivae normal.     Pupils: Pupils are equal, round, and reactive to light.  Cardiovascular:     Rate and Rhythm: Regular rhythm. Tachycardia present.     Heart sounds: No murmur heard.   No friction rub. No gallop.  Pulmonary:     Effort: Pulmonary effort is normal. No respiratory distress.     Breath sounds: Normal breath sounds. No stridor. No rales.  Abdominal:     General: There is no distension.     Palpations: Abdomen is soft.     Tenderness: There is abdominal tenderness (mild discomfort) in the right lower quadrant and suprapubic area.    Musculoskeletal:        General: No tenderness.      Cervical back: Normal range of motion and neck supple.  Skin:    General: Skin is warm and dry.     Findings: No erythema or rash.  Neurological:     Mental Status: She is alert and oriented to person, place, and time.  ED Results and Treatments Labs (all labs ordered are listed, but only abnormal results are displayed) Labs Reviewed  CBC WITH DIFFERENTIAL/PLATELET - Abnormal; Notable for the following components:      Result Value   RBC 3.60 (*)    Hemoglobin 8.0 (*)    HCT 25.5 (*)    MCV 70.8 (*)    MCH 22.2 (*)    RDW 16.4 (*)    Platelets 515 (*)    All other components within normal limits  COMPREHENSIVE METABOLIC PANEL - Abnormal; Notable for the following components:   Glucose, Bld 509 (*)    Creatinine, Ser 1.11 (*)    Calcium 8.4 (*)    Albumin 2.4 (*)    Alkaline Phosphatase 134 (*)    GFR, Estimated 59 (*)    All other components within normal limits  CBG MONITORING, ED - Abnormal; Notable for the following components:   Glucose-Capillary 526 (*)    All other components within normal limits  I-STAT VENOUS BLOOD GAS, ED - Abnormal; Notable for the following components:   pH, Ven 7.544 (*)    pCO2, Ven 31.9 (*)    pO2, Ven 56 (*)    Acid-Base Excess 5.0 (*)    Potassium 3.4 (*)    Calcium, Ion 1.11 (*)    HCT 26.0 (*)    Hemoglobin 8.8 (*)    All other components within normal limits  CBG MONITORING, ED - Abnormal; Notable for the following components:   Glucose-Capillary 432 (*)    All other components within normal limits  CBG MONITORING, ED - Abnormal; Notable for the following components:   Glucose-Capillary 198 (*)    All other components within normal limits  TROPONIN I (HIGH SENSITIVITY)  TROPONIN I (HIGH SENSITIVITY)                                                                                                                         EKG  EKG Interpretation  Date/Time:  Wednesday Jul 18 2021 00:47:54 EDT Ventricular Rate:  123 PR  Interval:  129 QRS Duration: 57 QT Interval:  335 QTC Calculation: 480 R Axis:   34 Text Interpretation: Sinus tachycardia Nonspecific T abnormalities, lateral leads Confirmed by Addison Lank [phone] on 07/18/2021 6:43:03 AM       Radiology CT ABDOMEN PELVIS W CONTRAST  Result Date: 07/18/2021 CLINICAL DATA:  Appendix removed on 06/22/2021. Evaluate for bowel obstruction. Abdominal pain. EXAM: CT ABDOMEN AND PELVIS WITH CONTRAST TECHNIQUE: Multidetector CT imaging of the abdomen and pelvis was performed using the standard protocol following bolus administration of intravenous contrast. RADIATION DOSE REDUCTION: This exam was performed according to the departmental dose-optimization program which includes automated exposure control, adjustment of the mA and/or kV according to patient size and/or use of iterative reconstruction technique. CONTRAST:  133mL OMNIPAQUE IOHEXOL 300 MG/ML  SOLN COMPARISON:  None Available. FINDINGS: Lower chest: No acute abnormality. Hepatobiliary: No focal liver abnormality is seen. No gallstones, gallbladder wall  thickening, or biliary dilatation. Pancreas: Unremarkable. No pancreatic ductal dilatation or surrounding inflammatory changes. Spleen: Normal in size without focal abnormality. Adrenals/Urinary Tract: There is a rounded hypodensity in the left kidney which is too small to characterize, likely a cyst. Otherwise, the kidneys, adrenal glands, and bladder are within normal limits. Stomach/Bowel: Patient is status post appendectomy. There is an enhancing fluid collection containing air in the right paracolic gutter extending into the posterior right abdominal intramuscular wall. This collection measures 4.3 x 5.4 x 6.1 cm. Percutaneous drainage catheter is seen coiled in this collection. There is no bowel obstruction or free air. There is a large amount of stool throughout the colon. Stomach is within normal limits. Vascular/Lymphatic: No significant vascular findings  are present. No enlarged abdominal or pelvic lymph nodes. Reproductive: Status post hysterectomy. No adnexal masses. Other: No free fluid or focal abdominal wall hernia. There is subcutaneous stranding at the level of the percutaneous catheter and mild edema of the intramuscular wall at this level as well. Musculoskeletal: No acute or significant osseous findings. IMPRESSION: 1. Patient is status post appendectomy. Enhancing fluid collection containing air in the right paracolic gutter extending into the adjacent intramuscular wall consistent with abscess. Percutaneous drainage catheter in place within this collection. 2. No evidence for bowel obstruction. Electronically Signed   By: Ronney Asters M.D.   On: 07/18/2021 03:52   DG Chest Port 1 View  Result Date: 07/18/2021 CLINICAL DATA:  Syncope. EXAM: PORTABLE CHEST 1 VIEW COMPARISON:  Chest x-ray 01/30/2004 FINDINGS: The heart size and mediastinal contours are within normal limits. Both lungs are clear. The visualized skeletal structures are unremarkable. IMPRESSION: No active disease. Electronically Signed   By: Ronney Asters M.D.   On: 07/18/2021 01:32    Pertinent labs & imaging results that were available during my care of the patient were reviewed by me and considered in my medical decision making (see MDM for details).  Medications Ordered in ED Medications  sodium chloride 0.9 % bolus 1,000 mL (0 mLs Intravenous Stopped 07/18/21 0419)    Followed by  0.9 %  sodium chloride infusion ( Intravenous Stopped 07/18/21 0734)  iohexol (OMNIPAQUE) 300 MG/ML solution 100 mL (100 mLs Intravenous Contrast Given 07/18/21 0338)  insulin aspart (novoLOG) injection 8 Units (8 Units Subcutaneous Given 07/18/21 0435)                                                                                                                                     Procedures Procedures  (including critical care time)  Medical Decision Making / ED Course    Complexity of  Problem:  Co-morbidities/SDOH that complicate the patient evaluation/care: Noted above in HPI  Additional history obtained: Daughter Recent hospital stay.  Lab results noted below  Patient's presenting problem/concern, DDX, and MDM listed below: Syncopal episode In the setting of nausea and vomiting. Likely related to GI losses versus vasovagal. Rule out  cardiac process. Given her complicated recent history, will also obtain screening labs to assess for any electrolyte or metabolic derangements. We will obtain a CT scan to characterize the patient's surgical complication and determine drain positioning  Hospitalization Considered:  Yes  Initial Intervention:  IV fluids and antiemetics    Complexity of Data:   Cardiac Monitoring: The patient was maintained on a cardiac monitor.   I personally viewed and interpreted the cardiac monitored which showed an underlying rhythm of sinus tachycardia.  No dysrhythmias. EKG was sinus tachycardia.  Nonspecific ST changes without acute ischemia.  No dysrhythmias or blocks  Laboratory Tests ordered listed below with my independent interpretation: CBC without leukocytosis.  Hemoglobin of 8.  Stable from outside hospital labs noted during recent admission. Metabolic panel notable for hyperglycemia with glucose greater than 500 without evidence of DKA.  No other significant electrolyte derangements.  Mild renal insufficiency. Serial troponins negative x2.    Imaging Studies ordered listed below with my independent interpretation: Chest x-ray without evidence of pneumonia, pneumothorax, pulmonary edema or pleural effusions. CT of the abdomen revealed known abscess in the right lower quadrant with proper placement of the percutaneous drain.  No evidence of bowel obstruction.  Or other acute processes.     ED Course:    Assessment, Add'l Intervention, and Reassessment: Syncopal episode Likely secondary to volume loss and vasovagal Cardiac  work-up was reassuring. Other than the hyperglycemia the rest of the labs are reassuring  Hyperglycemia No evidence of DKA Treated with IV fluids and insulin Blood sugars trended down to the 100s.   Final Clinical Impression(s) / ED Diagnoses Final diagnoses:  Syncope and collapse  Postoperative intra-abdominal abscess  Hyperglycemia   The patient appears reasonably screened and/or stabilized for discharge and I doubt any other medical condition or other [facility] requiring further screening, evaluation, or treatment in the ED at this time prior to discharge. Safe for discharge with strict return precautions.  Disposition: Discharge  Condition: Good  I have discussed the results, Dx and Tx plan with the patient/family who expressed understanding and agree(s) with the plan. Discharge instructions discussed at length. The patient/family was given strict return precautions who verbalized understanding of the instructions. No further questions at time of discharge.    ED Discharge Orders          Ordered    ondansetron (ZOFRAN-ODT) 4 MG disintegrating tablet  Every 8 hours PRN        07/18/21 0730                    This chart was dictated using voice recognition software.  Despite best efforts to proofread,  errors can occur which can change the documentation meaning.    Fatima Blank, MD 07/18/21 [phone]

## 2021-07-18 NOTE — Patient Instructions (Addendum)
We talked about your infection and your diabetes today. It is really important to control your sugars to let your body heal from this infection.  Diabetes- I refilled your supplies. Try checking your sugars more often in the next two weeks. - take 30 units of Lantus daily.  - take 1 tablet of synjardy twice a day  Blood pressure - take one tablet of hyzaar daily  Infection - continue taking augmentin and ciprofloxacin twice a day  HIV - take your Odesfsey every morning  If you have any fever, chills, or increasing pain these would be reasons to contact us in clinic.

## 2021-07-18 NOTE — ED Provider Notes (Signed)
[facility] EMERGENCY DEPARTMENT Provider Note   CSN: [id number] Arrival date & time: 07/18/21  1748     History  Chief Complaint  Patient presents with   Seizure-Like activity    Ashley Vaughn is a 56 y.o. female.  Patient is a 56 year old female with a history of diabetes, HIV, hypertension, recent appendectomy and resultant abscess with a drain in place currently on Cipro and Augmentin who is presenting today with EMS from home with seizure-like activity.  Patient reports it first happened last night when she had an episode where they initially thought maybe she passed out and her daughter lowered her to the floor but noticed some shaking.  In the emergency room she had labs and CT done and was found to be hyperglycemic given fluids had no change in her known abscess and was discharged home.  She reports when she got home she had something to eat and she was on the floor playing with the kids when her head started pulling to the left and her arm started twitching.  EMS reported that when they arrived on scene and upon arrival to the ED patient had 2 events that appeared to be seizures.  She drew her face to the left extended her right arm and her left arm was twitching for about 20 seconds.  For them they reports she never lost consciousness and she was able to talk during the event.  However she was hypertensive at the time which improved when she returned to baseline.  Patient reports she has never had anything like this before she does not have any known seizure disorder.  She denies any headache and there is been no trauma to her brain.  She currently reports just feeling tired.  The history is provided by the patient and the EMS personnel.      Home Medications Prior to Admission medications   Medication Sig Start Date End Date Taking? Authorizing Provider  Accu-Chek Softclix Lancets lancets Check blood sugar 3 times per day 05/23/21   Scarlett Presto, MD   amoxicillin-clavulanate (AUGMENTIN) 875-125 MG tablet Take 1 tablet by mouth See admin instructions. Bid x 14 days 07/12/21   [provider]  B-D UF III MINI PEN NEEDLES 31G X 5 MM MISC Use to inject insulin one time daily 07/18/21   Demaio, Alexa, MD  Blood Glucose Monitoring Suppl (ACCU-CHEK GUIDE ME) w/Device KIT 1 kit by Does not apply route daily. Patient not taking: Reported on 03/16/2021 09/30/19   Kuppelweiser, Cassie L, RPH-CPP  Blood Glucose Monitoring Suppl (ACCU-CHEK NANO SMARTVIEW) w/Device KIT 1 each by Does not apply route 2 (two) times daily. Check blood sugar 4x a day before meals and bedtime  2 days each week. Dx code- 250.00. 07/18/21   Demaio, Roxine Caddy, MD  Cholecalciferol (VITAMIN D3) 5000 units TABS Take 5,000 Units by mouth daily.    [provider]  ciprofloxacin (CIPRO) 750 MG tablet Take 750 mg by mouth See admin instructions. Bid x 14 days 07/12/21   [provider]  DULoxetine (CYMBALTA) 60 MG capsule Take 1 capsule (60 mg total) by mouth daily. 05/23/21 07/18/21  Demaio, Alexa, MD  Empagliflozin-metFORMIN HCl (SYNJARDY) 5-500 MG TABS Take 1 tablet by mouth 2 (two) times daily. 07/18/21   Scarlett Presto, MD  emtricitabine-rilpivir-tenofovir AF (ODEFSEY) 200-25-25 MG TABS tablet Take 1 tablet by mouth daily with breakfast. 07/18/21   Demaio, Alexa, MD  glucose blood (ACCU-CHEK GUIDE) test strip Check blood sugar 3 times  per day 07/18/21   Scarlett Presto, MD  insulin glargine (LANTUS SOLOSTAR) 100 UNIT/ML Solostar Pen Inject 30 Units into the skin daily. 07/18/21 02/03/22  Demaio, Alexa, MD  Lancets Misc. (ACCU-CHEK SOFTCLIX LANCET DEV) KIT Use to check blood sugar up to 3 times a day before meals. 07/18/21   Demaio, Alexa, MD  losartan-hydrochlorothiazide (HYZAAR) 50-12.5 MG tablet Take 1 tablet by mouth daily. 05/08/21   Lajean Manes, MD  ondansetron (ZOFRAN-ODT) 4 MG disintegrating tablet Take 1 tablet (4 mg total) by mouth every 8 (eight) hours as needed for up  to 3 days for nausea or vomiting. 07/18/21 07/21/21  Cardama, Grayce Sessions, MD      Allergies    Fluorescein    Review of Systems   Review of Systems  Physical Exam Updated Vital Signs BP (!) 150/78   Pulse 96   Temp 99.2 F (37.3 C) (Oral)   Resp 16   SpO2 100%  Physical Exam Vitals and nursing note reviewed.  Constitutional:      General: She is not in acute distress.    Appearance: She is well-developed.  HENT:     Head: Normocephalic and atraumatic.  Eyes:     Pupils: Pupils are equal, round, and reactive to light.  Cardiovascular:     Rate and Rhythm: Normal rate and regular rhythm.     Heart sounds: Normal heart sounds. No murmur heard.   No friction rub.  Pulmonary:     Effort: Pulmonary effort is normal.     Breath sounds: Normal breath sounds. No wheezing or rales.  Abdominal:     General: Bowel sounds are normal. There is no distension.     Palpations: Abdomen is soft.     Tenderness: There is no abdominal tenderness. There is no guarding or rebound.     Comments: Drain in the right lower abdomen  Musculoskeletal:        General: No tenderness. Normal range of motion.     Comments: No edema  Skin:    General: Skin is warm and dry.     Findings: No rash.  Neurological:     Mental Status: She is alert and oriented to person, place, and time. Mental status is at baseline.     Cranial Nerves: No cranial nerve deficit.  Psychiatric:        Mood and Affect: Mood normal.        Behavior: Behavior normal.    ED Results / Procedures / Treatments   Labs (all labs ordered are listed, but only abnormal results are displayed) Labs Reviewed  CBC - Abnormal; Notable for the following components:      Result Value   Hemoglobin 8.4 (*)    HCT 28.8 (*)    MCV 72.2 (*)    MCH 21.1 (*)    MCHC 29.2 (*)    RDW 16.8 (*)    Platelets 504 (*)    All other components within normal limits  BASIC METABOLIC PANEL - Abnormal; Notable for the following components:    Potassium 3.2 (*)    Glucose, Bld 324 (*)    Calcium 8.7 (*)    All other components within normal limits  CBG MONITORING, ED - Abnormal; Notable for the following components:   Glucose-Capillary 314 (*)    All other components within normal limits  I-STAT BETA HCG BLOOD, ED ([facility], [facility], [facility] ONLY)    EKG None  Radiology CT Head Wo Contrast  Result Date: 07/18/2021  CLINICAL DATA:  Seizure, new onset. No history of trauma. Seizure like activity. EXAM: CT HEAD WITHOUT CONTRAST TECHNIQUE: Contiguous axial images were obtained from the base of the skull through the vertex without intravenous contrast. RADIATION DOSE REDUCTION: This exam was performed according to the departmental dose-optimization program which includes automated exposure control, adjustment of the mA and/or kV according to patient size and/or use of iterative reconstruction technique. COMPARISON:  None Available. FINDINGS: Brain: No evidence of acute infarction, hemorrhage, hydrocephalus, extra-axial collection or mass lesion/mass effect. Vascular: No hyperdense vessel or unexpected calcification. Skull: Normal. Negative for fracture or focal lesion. Sinuses/Orbits: No acute finding. Other: None. IMPRESSION: No CT evidence of acute intracranial abnormality. Electronically Signed   By: Keane Police D.O.   On: 07/18/2021 21:54   CT ABDOMEN PELVIS W CONTRAST  Result Date: 07/18/2021 CLINICAL DATA:  Appendix removed on 06/22/2021. Evaluate for bowel obstruction. Abdominal pain. EXAM: CT ABDOMEN AND PELVIS WITH CONTRAST TECHNIQUE: Multidetector CT imaging of the abdomen and pelvis was performed using the standard protocol following bolus administration of intravenous contrast. RADIATION DOSE REDUCTION: This exam was performed according to the departmental dose-optimization program which includes automated exposure control, adjustment of the mA and/or kV according to patient size and/or use of iterative reconstruction technique. CONTRAST:   160mL OMNIPAQUE IOHEXOL 300 MG/ML  SOLN COMPARISON:  None Available. FINDINGS: Lower chest: No acute abnormality. Hepatobiliary: No focal liver abnormality is seen. No gallstones, gallbladder wall thickening, or biliary dilatation. Pancreas: Unremarkable. No pancreatic ductal dilatation or surrounding inflammatory changes. Spleen: Normal in size without focal abnormality. Adrenals/Urinary Tract: There is a rounded hypodensity in the left kidney which is too small to characterize, likely a cyst. Otherwise, the kidneys, adrenal glands, and bladder are within normal limits. Stomach/Bowel: Patient is status post appendectomy. There is an enhancing fluid collection containing air in the right paracolic gutter extending into the posterior right abdominal intramuscular wall. This collection measures 4.3 x 5.4 x 6.1 cm. Percutaneous drainage catheter is seen coiled in this collection. There is no bowel obstruction or free air. There is a large amount of stool throughout the colon. Stomach is within normal limits. Vascular/Lymphatic: No significant vascular findings are present. No enlarged abdominal or pelvic lymph nodes. Reproductive: Status post hysterectomy. No adnexal masses. Other: No free fluid or focal abdominal wall hernia. There is subcutaneous stranding at the level of the percutaneous catheter and mild edema of the intramuscular wall at this level as well. Musculoskeletal: No acute or significant osseous findings. IMPRESSION: 1. Patient is status post appendectomy. Enhancing fluid collection containing air in the right paracolic gutter extending into the adjacent intramuscular wall consistent with abscess. Percutaneous drainage catheter in place within this collection. 2. No evidence for bowel obstruction. Electronically Signed   By: Ronney Asters M.D.   On: 07/18/2021 03:52   DG Chest Port 1 View  Result Date: 07/18/2021 CLINICAL DATA:  Syncope. EXAM: PORTABLE CHEST 1 VIEW COMPARISON:  Chest x-ray 01/30/2004  FINDINGS: The heart size and mediastinal contours are within normal limits. Both lungs are clear. The visualized skeletal structures are unremarkable. IMPRESSION: No active disease. Electronically Signed   By: Ronney Asters M.D.   On: 07/18/2021 01:32    Procedures Procedures    Medications Ordered in ED Medications  LORazepam (ATIVAN) 2 MG/ML injection (1 mg  Given 07/18/21 1929)  lactated ringers bolus 1,000 mL (0 mLs Intravenous Stopped 07/18/21 2052)  levETIRAcetam (KEPPRA) IVPB 1500 mg/ 100 mL premix (0 mg Intravenous Stopped 07/18/21 2009)  ED Course/ Medical Decision Making/ A&P                           Medical Decision Making Amount and/or Complexity of Data Reviewed External Data Reviewed: notes.    Details: recent hospitalization Labs: ordered. Decision-making details documented in ED Course. Radiology: ordered and independent interpretation performed. Decision-making details documented in ED Course. ECG/medicine tests: ordered and independent interpretation performed. Decision-making details documented in ED Course.  Risk Prescription drug management. Decision regarding hospitalization.   Pt with multiple medical problems and comorbidities and presenting today with a complaint that caries a high risk for morbidity and mortality.  Returning to the emergency room now for a second time in the last 24 hours for seizure-like activity.  The initial episode happened last night and they thought it may have been passing out however EMS reported visualizing seizure-like activity today on their arrival.  While I was in the room talking with the patient she had a 2-minute seizure where she would was having tonic-clonic movement of the left upper extremity, head was strained to the left and right arm was stiff and extended.  Patient did bite her cheek and was nonresponsive.  Sats dropped to 60% briefly and then returned.  Patient was responsive fairly quickly after this event but is  groggy.  She received 1 mg of Ativan.  Keppra was ordered.  I independently interpreted patient's labs today and blood sugar is 300 with a potassium of 3.2 but normal anion gap of 10.  CBC with stable hemoglobin of 8.4 and normal white count.  Will scan patient's head to ensure no evidence of abscess or new space-occupying lesion causing seizures.  Will discuss with neurology.   10:51 PM I have independently visualized and interpreted pt's images today.head CT without acute process.  Will consult neuro.  Pt sleeping but arrousable at this time.  Unclear of cause of seizure. 10:51 PM Spoke with Dr. Curly Shores with neurology and she would like pt to have MRI with and without contrast of brain and then pt will need LP.  Pt will need admission.          Final Clinical Impression(s) / ED Diagnoses Final diagnoses:  Seizure ([place])  HIV infection, unspecified symptom status ([place])  Other specified diabetes mellitus without complication, with long-term current use of insulin [facility])    Rx / DC Orders ED Discharge Orders     None         Blanchie Dessert, MD 07/18/21 2251

## 2021-07-19 ENCOUNTER — Inpatient Hospital Stay (HOSPITAL_COMMUNITY): Payer: Medicaid Other

## 2021-07-19 DIAGNOSIS — I6389 Other cerebral infarction: Secondary | ICD-10-CM

## 2021-07-19 DIAGNOSIS — I6340 Cerebral infarction due to embolism of unspecified cerebral artery: Secondary | ICD-10-CM

## 2021-07-19 DIAGNOSIS — D500 Iron deficiency anemia secondary to blood loss (chronic): Secondary | ICD-10-CM | POA: Diagnosis not present

## 2021-07-19 DIAGNOSIS — R569 Unspecified convulsions: Secondary | ICD-10-CM | POA: Diagnosis not present

## 2021-07-19 DIAGNOSIS — I255 Ischemic cardiomyopathy: Secondary | ICD-10-CM | POA: Diagnosis not present

## 2021-07-19 DIAGNOSIS — B20 Human immunodeficiency virus [HIV] disease: Secondary | ICD-10-CM

## 2021-07-19 DIAGNOSIS — I639 Cerebral infarction, unspecified: Secondary | ICD-10-CM

## 2021-07-19 HISTORY — PX: IR CATHETER TUBE CHANGE: IMG717

## 2021-07-19 LAB — CBG MONITORING, ED
Glucose-Capillary: 239 mg/dL — ABNORMAL HIGH (ref 70–99)
Glucose-Capillary: 233 mg/dL — ABNORMAL HIGH (ref 70–99)
Glucose-Capillary: 136 mg/dL — ABNORMAL HIGH (ref 70–99)
Glucose-Capillary: 227 mg/dL — ABNORMAL HIGH (ref 70–99)

## 2021-07-19 LAB — COMPREHENSIVE METABOLIC PANEL
ALT: 16 U/L (ref 0–44)
AST: 17 U/L (ref 15–41)
Albumin: 2.3 g/dL — ABNORMAL LOW (ref 3.5–5.0)
Alkaline Phosphatase: 116 U/L (ref 38–126)
Anion gap: 7 (ref 5–15)
BUN: 6 mg/dL (ref 6–20)
CO2: 26 mmol/L (ref 22–32)
Calcium: 8.6 mg/dL — ABNORMAL LOW (ref 8.9–10.3)
Chloride: 107 mmol/L (ref 98–111)
Creatinine, Ser: 0.81 mg/dL (ref 0.44–1.00)
GFR, Estimated: >60 mL/min (ref >60)
Glucose, Bld: 257 mg/dL — ABNORMAL HIGH (ref 70–99)
Potassium: 2.9 mmol/L — ABNORMAL LOW (ref 3.5–5.1)
Sodium: 140 mmol/L (ref 135–145)
Total Bilirubin: 0.2 mg/dL — ABNORMAL LOW (ref 0.3–1.2)
Total Protein: 6.5 g/dL (ref 6.5–8.1)

## 2021-07-19 LAB — CBC WITH DIFFERENTIAL/PLATELET
Abs Immature Granulocytes: 0.01 10*3/uL (ref 0.00–0.07)
Basophils Absolute: 0.1 10*3/uL (ref 0.0–0.1)
Basophils Relative: 1 %
Eosinophils Absolute: 0.1 10*3/uL (ref 0.0–0.5)
Eosinophils Relative: 1 %
HCT: 26.7 % — ABNORMAL LOW (ref 36.0–46.0)
Hemoglobin: 8.1 g/dL — ABNORMAL LOW (ref 12.0–15.0)
Immature Granulocytes: 0 %
Lymphocytes Relative: 46 %
Lymphs Abs: 3.1 10*3/uL (ref 0.7–4.0)
MCH: 21.5 pg — ABNORMAL LOW (ref 26.0–34.0)
MCHC: 30.3 g/dL (ref 30.0–36.0)
MCV: 70.8 fL — ABNORMAL LOW (ref 80.0–100.0)
Monocytes Absolute: 0.5 10*3/uL (ref 0.1–1.0)
Monocytes Relative: 8 %
Neutro Abs: 3 10*3/uL (ref 1.7–7.7)
Neutrophils Relative %: 44 %
Platelets: 449 10*3/uL — ABNORMAL HIGH (ref 150–400)
RBC: 3.77 MIL/uL — ABNORMAL LOW (ref 3.87–5.11)
RDW: 16.8 % — ABNORMAL HIGH (ref 11.5–15.5)
WBC: 6.7 10*3/uL (ref 4.0–10.5)
nRBC: 0 % (ref 0.0–0.2)

## 2021-07-19 LAB — ECHOCARDIOGRAM COMPLETE
AR max vel: 2.59 cm2
AV Peak grad: 4 mmHg
Ao pk vel: 1 m/s
Area-P 1/2: 4.06 cm2
Height: 62 in
S' Lateral: 4.2 cm
Single Plane A4C EF: 21.9 %
Weight: 2256 oz

## 2021-07-19 LAB — LIPID PANEL
Cholesterol: 148 mg/dL (ref 0–200)
HDL: 34 mg/dL — ABNORMAL LOW (ref >40)
LDL Cholesterol: 90 mg/dL (ref 0–99)
Total CHOL/HDL Ratio: 4.4 RATIO
Triglycerides: 118 mg/dL (ref <150)
VLDL: 24 mg/dL (ref 0–40)

## 2021-07-19 LAB — GLUCOSE, CAPILLARY
Glucose-Capillary: 119 mg/dL — ABNORMAL HIGH (ref 70–99)
Glucose-Capillary: 205 mg/dL — ABNORMAL HIGH (ref 70–99)

## 2021-07-19 LAB — HEMOGLOBIN A1C
Hgb A1c MFr Bld: 14.2 % — ABNORMAL HIGH (ref 4.8–5.6)
Mean Plasma Glucose: 360.84 mg/dL

## 2021-07-19 LAB — MAGNESIUM: Magnesium: 1.9 mg/dL (ref 1.7–2.4)

## 2021-07-19 LAB — PHOSPHORUS: Phosphorus: 3.2 mg/dL (ref 2.5–4.6)

## 2021-07-19 MED ORDER — POTASSIUM CHLORIDE CRYS ER 20 MEQ PO TBCR
40.0000 meq | EXTENDED_RELEASE_TABLET | Freq: Once | ORAL | Status: AC
  Administered 2021-07-19: 40 meq via ORAL
  Filled 2021-07-19: qty 2

## 2021-07-19 MED ORDER — LORAZEPAM 2 MG/ML IJ SOLN: 1.0000 mg | Freq: Four times a day (QID) | INTRAMUSCULAR | Status: DC | PRN

## 2021-07-19 MED ORDER — LEVETIRACETAM IN NACL 1000 MG/100ML IV SOLN
1000.0000 mg | Freq: Two times a day (BID) | INTRAVENOUS | Status: DC
  Administered 2021-07-19 – 2021-07-20 (×4): 1000 mg via INTRAVENOUS
  Filled 2021-07-19 (×4): qty 100

## 2021-07-19 MED ORDER — POTASSIUM CHLORIDE CRYS ER 20 MEQ PO TBCR
40.0000 meq | EXTENDED_RELEASE_TABLET | Freq: Two times a day (BID) | ORAL | Status: DC
  Administered 2021-07-19 – 2021-07-23 (×10): 40 meq via ORAL
  Filled 2021-07-19 (×10): qty 2

## 2021-07-19 MED ORDER — INSULIN GLARGINE-YFGN 100 UNIT/ML ~~LOC~~ SOLN
15.0000 [IU] | Freq: Every day | SUBCUTANEOUS | Status: DC
  Administered 2021-07-19 – 2021-07-21 (×3): 15 [IU] via SUBCUTANEOUS
  Filled 2021-07-19 (×3): qty 0.15

## 2021-07-19 MED ORDER — ASPIRIN 81 MG PO TBEC
81.0000 mg | DELAYED_RELEASE_TABLET | Freq: Every day | ORAL | Status: DC
  Administered 2021-07-19 – 2021-07-27 (×9): 81 mg via ORAL
  Filled 2021-07-19 (×9): qty 1

## 2021-07-19 MED ORDER — METRONIDAZOLE 500 MG/100ML IV SOLN
500.0000 mg | Freq: Once | INTRAVENOUS | Status: AC
Start: 2021-07-19 — End: 2021-07-19
  Administered 2021-07-19: 500 mg via INTRAVENOUS
  Filled 2021-07-19: qty 100

## 2021-07-19 MED ORDER — POTASSIUM CHLORIDE IN NACL 40-0.9 MEQ/L-% IV SOLN
INTRAVENOUS | Status: DC
  Filled 2021-07-19 (×4): qty 1000

## 2021-07-19 MED ORDER — INSULIN ASPART 100 UNIT/ML IJ SOLN
0.0000 [IU] | Freq: Every day | INTRAMUSCULAR | Status: DC
  Administered 2021-07-19: 2 [IU] via SUBCUTANEOUS

## 2021-07-19 MED ORDER — IOHEXOL 350 MG/ML SOLN
100.0000 mL | Freq: Once | INTRAVENOUS | Status: AC | PRN
  Administered 2021-07-19: 50 mL via INTRAVENOUS

## 2021-07-19 MED ORDER — SODIUM CHLORIDE 0.9 % IV SOLN: INTRAVENOUS | Status: DC

## 2021-07-19 MED ORDER — HEPARIN SODIUM (PORCINE) 5000 UNIT/ML IJ SOLN
5000.0000 [IU] | Freq: Three times a day (TID) | INTRAMUSCULAR | Status: DC
  Administered 2021-07-19 – 2021-07-27 (×25): 5000 [IU] via SUBCUTANEOUS
  Filled 2021-07-19 (×25): qty 1

## 2021-07-19 MED ORDER — POLYETHYLENE GLYCOL 3350 17 G PO PACK: 17.0000 g | PACK | Freq: Every day | ORAL | Status: DC | PRN

## 2021-07-19 MED ORDER — INSULIN ASPART 100 UNIT/ML IJ SOLN
0.0000 [IU] | INTRAMUSCULAR | Status: DC
  Administered 2021-07-19: 2 [IU] via SUBCUTANEOUS
  Administered 2021-07-19 (×2): 5 [IU] via SUBCUTANEOUS

## 2021-07-19 MED ORDER — POTASSIUM CHLORIDE 10 MEQ/100ML IV SOLN
10.0000 meq | INTRAVENOUS | Status: AC
  Filled 2021-07-19: qty 100

## 2021-07-19 MED ORDER — SODIUM CHLORIDE 0.9% FLUSH
5.0000 mL | Freq: Three times a day (TID) | INTRAVENOUS | Status: DC
  Administered 2021-07-19 – 2021-07-27 (×23): 5 mL

## 2021-07-19 MED ORDER — GADOBUTROL 1 MMOL/ML IV SOLN
6.0000 mL | Freq: Once | INTRAVENOUS | Status: AC | PRN
Start: 2021-07-19 — End: 2021-07-19
  Administered 2021-07-19: 6 mL via INTRAVENOUS

## 2021-07-19 MED ORDER — METRONIDAZOLE 500 MG/100ML IV SOLN
500.0000 mg | Freq: Two times a day (BID) | INTRAVENOUS | Status: DC
  Administered 2021-07-19 – 2021-07-20 (×2): 500 mg via INTRAVENOUS
  Filled 2021-07-19 (×2): qty 100

## 2021-07-19 MED ORDER — ACETAMINOPHEN 325 MG PO TABS
650.0000 mg | ORAL_TABLET | Freq: Four times a day (QID) | ORAL | Status: DC | PRN
  Administered 2021-07-19 – 2021-07-26 (×2): 650 mg via ORAL
  Filled 2021-07-19 (×2): qty 2

## 2021-07-19 MED ORDER — CEFEPIME HCL 2 G IV SOLR
2.0000 g | Freq: Once | INTRAVENOUS | Status: AC
  Administered 2021-07-19: 2 g via INTRAVENOUS
  Filled 2021-07-19: qty 12.5

## 2021-07-19 MED ORDER — ROSUVASTATIN CALCIUM 20 MG PO TABS
20.0000 mg | ORAL_TABLET | Freq: Every day | ORAL | Status: DC
  Administered 2021-07-19 – 2021-07-26 (×8): 20 mg via ORAL
  Filled 2021-07-19 (×8): qty 1

## 2021-07-19 MED ORDER — CLOPIDOGREL BISULFATE 75 MG PO TABS
75.0000 mg | ORAL_TABLET | Freq: Every day | ORAL | Status: DC
  Administered 2021-07-19 – 2021-07-27 (×9): 75 mg via ORAL
  Filled 2021-07-19 (×9): qty 1

## 2021-07-19 MED ORDER — MELATONIN 5 MG PO TABS
5.0000 mg | ORAL_TABLET | Freq: Every day | ORAL | Status: AC
Start: 2021-07-19 — End: 2021-07-20
  Administered 2021-07-19: 5 mg via ORAL
  Filled 2021-07-19 (×2): qty 1

## 2021-07-19 MED ORDER — CEFEPIME HCL 2 G IV SOLR
2.0000 g | Freq: Three times a day (TID) | INTRAVENOUS | Status: DC
  Administered 2021-07-19 – 2021-07-20 (×2): 2 g via INTRAVENOUS
  Filled 2021-07-19 (×2): qty 12.5

## 2021-07-19 MED ORDER — DULOXETINE HCL 60 MG PO CPEP
60.0000 mg | ORAL_CAPSULE | Freq: Every day | ORAL | Status: DC
  Administered 2021-07-19 – 2021-07-27 (×9): 60 mg via ORAL
  Filled 2021-07-19 (×9): qty 1

## 2021-07-19 MED ORDER — INSULIN ASPART 100 UNIT/ML IJ SOLN
0.0000 [IU] | Freq: Three times a day (TID) | INTRAMUSCULAR | Status: DC
  Administered 2021-07-20: 2 [IU] via SUBCUTANEOUS
  Administered 2021-07-20 – 2021-07-23 (×5): 3 [IU] via SUBCUTANEOUS
  Administered 2021-07-24 (×2): 2 [IU] via SUBCUTANEOUS
  Administered 2021-07-25: 3 [IU] via SUBCUTANEOUS
  Administered 2021-07-25: 2 [IU] via SUBCUTANEOUS
  Administered 2021-07-26: 8 [IU] via SUBCUTANEOUS
  Administered 2021-07-27: 3 [IU] via SUBCUTANEOUS
  Administered 2021-07-27: 5 [IU] via SUBCUTANEOUS
  Administered 2021-07-27: 2 [IU] via SUBCUTANEOUS

## 2021-07-19 NOTE — ED Notes (Signed)
Patient transported to MRI 

## 2021-07-19 NOTE — TOC Initial Note (Signed)
Transition of Care [facility]) - Initial/Assessment Note    Patient Details  Name: Ashley Vaughn MRN: [id number] Date of Birth: 1965/03/10  Transition of Care [facility]) CM/SW Contact:    Ninfa Meeker, RN Phone Number: 07/19/2021, 4:24 PM  Clinical Narrative:                 Transition of Care Screening Note:  Transition of Care Department [facility]) has reviewed patient and no TOC needs have been identified at this time. We will continue to monitor patient advancement through Interdisciplinary progressions. If new patient transition needs arise, please place a consult.          Patient Goals and CMS Choice        Expected Discharge Plan and Services                                                Prior Living Arrangements/Services                       Activities of Daily Living      Permission Sought/Granted                  Emotional Assessment              Admission diagnosis:  Seizure [facility]) [R56.9] Other specified diabetes mellitus without complication, with long-term current use of insulin ([place]) [E13.9, Z79.4] HIV infection, unspecified symptom status ([place]) [B20] Patient Active Problem List   Diagnosis Date Noted   Postoperative infection 07/18/2021   Seizure ([place]) 07/18/2021   S/P laparoscopic appendectomy 06/22/2021   Hypertension 12/21/2020   Fatigue 12/21/2020   HSV (herpes simplex virus) anogenital infection 03/12/2019   Health care maintenance 03/18/2012   Major depressive disorder, single episode, severe ([place]) 01/05/2010   Human immunodeficiency virus (HIV) disease ([place]) 10/13/2008   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 [address] Phone: [phone] Fax: [phone]  [facility] [place], [place] [address] Phone: [phone] Fax: [phone]     Social Determinants of Health (SDOH) Interventions    Readmission Risk Interventions     View : No data to display.

## 2021-07-19 NOTE — Progress Notes (Signed)
EEG completed, results pending. 

## 2021-07-19 NOTE — ED Notes (Signed)
Patient appears to be resting comfortably at this time, call bell in reach, monitor on and cycling Patient reports no complaints

## 2021-07-19 NOTE — Procedures (Signed)
  Procedure:  Exchange/upsize RLQ drain 63F Preprocedure diagnosis: The primary encounter diagnosis was Seizure ([place]). Diagnoses of HIV infection, unspecified symptom status ([place]) and Other specified diabetes mellitus without complication, with long-term current use of insulin ([place]) were also pertinent to this visit.  Postprocedure diagnosis: same EBL:    minimal Complications:   none immediate  See full dictation in [facility].  Dillard Cannon MD Main # [phone] Pager  [phone] Mobile [phone]

## 2021-07-19 NOTE — Progress Notes (Addendum)
IM teaching service patient and we have called them to assume care in a.m. 6/2  Briefly as per Dr. 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 Kennon Rounds 2004 Poorly controlled DM TY 2 A1c above 14 diagnosed 2002/insulin Recent lap appendectomy [id number] complicated 2/2 intra-abdominal abscess + need for PERC drain ABX (cultures grew 4+ Proteus Mirabella's + Streptococcus anginosus) initially was on ertapenem--was d/c on Augmentin and Cipro as per ID consult at [facility] She did have mild DKA at that hospitalization glucose 631 bicarb 18 gap 15 repeat CT 5/22 showed decreased abscess size drain still putting out 15 to 30 cc  -presented 5/30 to ED syncope and/V shaking without tongue biting urinary incontinence She was discharged home and then returned 5/31 with witnessed seizures?  Retained consciousness?  Lasted 30 seconds   MRI brain performed and overview per neurology showed few scattered subcentimeter acute infarcts subcortical right cerebral hemisphere  Hemoglobin 8.1 WBC 6.7 platelet 449 Potassium 2.9 BUN/creatinine 6/0.8 albumin 2.3 CBGs in the 200-2 70 range  started on Keppra in the ED Neurologist assessed the patient 5/31   Rx Keppra 1500 X1 5/31 and then 1000 twice daily-kept n.p.o. 2/2 risk of seizure-given 2 rounds of potassium IV and then K. Dur 40x1 Ultimately placed on KCl 40 and 1000 of NS at 100 cc/H magnesium normal   CTA showed negative for large vessel occlusion, + questionable filling defect, nonocclusive thrombus supraclinoid right ICA with underlying atherosclerosis of both carotid bifurcation/ICA Stenosis 60% right moderate left-partially visible mediastinal LAD and?  2/2 HIV lymphoproliferative disorder-recommendation of CT chest with contrast was made  S Is awake coherent pleasant-she can tell me that she is in [facility] cannot tell me the time  but is able to tell me the year, the current president and the former president She does not have much recollection of what was going on prior to coming in She tells me she is noncompliant on her medications because she is living with her daughter but then for the past 2 weeks was living with another set of grandkids in an apartment? At this time she has had no further seizure I discussed her case with neurologist and they will forward this to stroke service who will see and determine if any other work-up of the carotid supraclinoid issues need intervention upon  O./e BP (!) 165/91   Pulse 89   Temp 98.3 F (36.8 C) (Oral)   Resp 10   SpO2 100%  Coherent Past-pointing noted on both sides Power is 5/5 Vision by direct confrontation is intact power to shoulder shrug is intact Mallampati 2 she does not open her mouth really well She has poor dentition I did not examine the back of her throat Her abdomen is soft no rebound no guarding she does have a drain in the right lower quadrant which seems to be draining-I am not sure when it has last been charged however S1-S2 slight tachycardia Abdomen is somewhat soft without any tenderness She is able to bend her knees plantar and dorsiflex has 5/5 power in major muscle groups I did not test for dysdiadochokinesia Plantars are downgoing Chest is clear no added sound no rales no rhonchi    Assessment/plan  Seizures- noted on admission-loaded with Keppra-further care as per neurology service--- I expect that she will be placed on an AED on discharge given her back-to-back events  Hypokalemia-would definitely replace aggressively potassium as  this can be a nidus for further epileptogenic events-recheck labs including magnesium in the morning  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  Defer to neurology might  benefit as an outpatient from discussion about this, neurology service is already engaged and can comment on this -secondary  prevention modification is integral in her case as her A1c is 14-continue sliding scale  Recent laparoscopic appendectomy-unclear if she ever had follow-up I will ask general surgery not emergently to look in on her later today-she may need a drain study at some point  Uncontrolled/noncompliant on HIV meds-I will CC Dr. Johnnye Sima up the teaching service/infectious disease service to see the patient versus delineate further steps with regards to compliance-May need social worker input with regards to her lack of compliance  Recent DKA admission as above discussion---Will add long-acting insulin 8 units of Levemir A1c is very elevated in the 14 range recently apparently  Recent Perc drain placement to retrocecal large appendix at [facility] as per above details Appreciative of general surgery expertise-imaging has been deferred I have discontinued oral Augmentin/Cipro-patient was recently treated for Streptococcus Mirabella's and proved Proteus Mirabella's etc. as above and we will start Unasyn in its place until we have some stability and patient has been cleared by neurology in terms of the seizure issues she can then transition back it appears that the stop date for antibiotics would actually been 5/30 if it was 2 weeks duration  No charge  Verneita Griffes, MD [facility] 9:36 AM

## 2021-07-19 NOTE — ED Notes (Signed)
Pt had incontinent BM episode, pt was cleaned and bed changed, pt able to ambulate without assistance

## 2021-07-19 NOTE — Progress Notes (Signed)
VASCULAR LAB    Bilateral lower extremity venous duplex has been performed.  See CV proc for preliminary results.   Joseandres Mazer, RVT 07/19/2021, 11:26 AM

## 2021-07-19 NOTE — Consult Note (Signed)
Ashley Vaughn [birth year]  [id number].    Requesting MD: Dr. Pleas KochJai Samtani Chief Complaint/Reason for Consult: Hx appendectomy, IAA  HPI: Ashley RaiderLisa M Quickel is a 56 y.o. female with a hx HIV, CKD3, HTN, DM2 who presented to the Ed on 5/31 for seizures.   Patient has a hx of laparoscopic appendectomy on 06/19/21 by Dr. Monika SalkArafeh at [facility]. Represented w/ large retrocecal abscess at site of prior appendectomy and IR placed percutaneous drain on 5/17. Wound cultures positive for 4+ proteus mirabilis and streptococcus anginosus for which she was tx w/ IV ertapenem, unasyn and Ciprofloxacin, with 2 weeks of Augmentin and Ciprofloxacin at discharge. Repeat CT scan on 5/22 showed decrease in abscess size. At time of d/c the drain was still with purulent output ranging from 15-30cc per day per notes. Drain is still present but patient states that it has not put much of anything out over the last several days. She denies any abdominal pain, nausea, vomiting, fever, chills. She is tolerating a diet and having regular bowel function. A CT A/P was done in the ED that shows enhancing fluid collection containing air in the right paracolic gutter extending into the adjacent intramuscular wall consistent with abscess with percutaneous drainage catheter in place within this collection. We were asked to see. [facility] and neurology consulted.   Family History  Problem Relation Age of Onset   Diabetes Mother    Hypertension Mother    Heart disease Father    Arthritis Father    Diabetes Maternal Uncle     Past Medical History:  Diagnosis Date   Depression    Diabetes mellitus    History of positive PPD, treatment status unknown    HIV infection (HCC)     Past Surgical History:  Procedure Laterality Date   ABDOMINAL HYSTERECTOMY      Social History:  reports that she has never smoked. She has been exposed to tobacco smoke. She has never used smokeless tobacco. She reports  current alcohol use. She reports that she does not use drugs.  Allergies:  Allergies  Allergen Reactions   Fluorescein Itching and Rash    INTRAVENOUSLY    (Not in a hospital admission)    Physical Exam: Blood pressure (!) 152/90, pulse 91, temperature 98.3 F (36.8 C), temperature source Oral, resp. rate 14, SpO2 100 %. Gen: alert, NAD Pulm: rate and effort normal on room air Abd: soft, ND, NT, lap incisions cdi without erythema or drainage, IR drain present with scant purulent fluid in bulb Skin: warm and dry  Results for orders placed or performed during the hospital encounter of 07/18/21 (from the past 48 hour(s))  CBC - if new onset seizures     Status: Abnormal   Collection Time: 07/18/21  6:31 PM  Result Value Ref Range   WBC 7.2 4.0 - 10.5 K/uL   RBC 3.99 3.87 - 5.11 MIL/uL   Hemoglobin 8.4 (L) 12.0 - 15.0 g/dL    Comment: Reticulocyte Hemoglobin testing may be clinically indicated, consider ordering this additional test [id number]    HCT 28.8 (L) 36.0 - 46.0 %   MCV 72.2 (L) 80.0 - 100.0 fL   MCH 21.1 (L) 26.0 - 34.0 pg   MCHC 29.2 (L) 30.0 - 36.0 g/dL   RDW 47.816.8 (H) 29.511.5 - 62.115.5 %   Platelets 504 (H) 150 - 400 K/uL   nRBC 0.0 0.0 - 0.2 %    Comment: Performed  at [facility], [address], [place], [place] [address]  Basic metabolic panel - if new onset seizures     Status: Abnormal   Collection Time: 07/18/21  6:31 PM  Result Value Ref Range   Sodium 140 135 - 145 mmol/L   Potassium 3.2 (L) 3.5 - 5.1 mmol/L   Chloride 108 98 - 111 mmol/L   CO2 22 22 - 32 mmol/L   Glucose, Bld 324 (H) 70 - 99 mg/dL    Comment: Glucose reference range applies only to samples taken after fasting for at least 8 hours.   BUN 9 6 - 20 mg/dL   Creatinine, Ser 6.04 0.44 - 1.00 mg/dL   Calcium 8.7 (L) 8.9 - 10.3 mg/dL   GFR, Estimated >54 >09 mL/min    Comment: (NOTE) Calculated using the CKD-EPI Creatinine Equation (2021)    Anion gap 10 5 - 15    Comment: Performed  at [facility], [address], [place], [place] [address]  Magnesium     Status: None   Collection Time: 07/18/21  6:31 PM  Result Value Ref Range   Magnesium 1.9 1.7 - 2.4 mg/dL    Comment: Performed at [facility], [address], [place], [place] [address]  CBG monitoring, ED     Status: Abnormal   Collection Time: 07/18/21  6:36 PM  Result Value Ref Range   Glucose-Capillary 314 (H) 70 - 99 mg/dL    Comment: Glucose reference range applies only to samples taken after fasting for at least 8 hours.  I-Stat beta hCG blood, ED     Status: None   Collection Time: 07/18/21  6:46 PM  Result Value Ref Range   I-stat hCG, quantitative <5.0 <5 mIU/mL   Comment 3            Comment:   GEST. AGE      CONC.  (mIU/mL)   <=1 WEEK        5 - 50     2 WEEKS       50 - 500     3 WEEKS       100 - 10,000     4 WEEKS     1,000 - 30,000        FEMALE AND NON-PREGNANT FEMALE:     LESS THAN 5 mIU/mL   CBG monitoring, ED     Status: Abnormal   Collection Time: 07/19/21  2:58 AM  Result Value Ref Range   Glucose-Capillary 239 (H) 70 - 99 mg/dL    Comment: Glucose reference range applies only to samples taken after fasting for at least 8 hours.  CBC with Differential/Platelet     Status: Abnormal   Collection Time: 07/19/21  3:59 AM  Result Value Ref Range   WBC 6.7 4.0 - 10.5 K/uL   RBC 3.77 (L) 3.87 - 5.11 MIL/uL   Hemoglobin 8.1 (L) 12.0 - 15.0 g/dL    Comment: Reticulocyte Hemoglobin testing may be clinically indicated, consider ordering this additional test [id number]    HCT 26.7 (L) 36.0 - 46.0 %   MCV 70.8 (L) 80.0 - 100.0 fL   MCH 21.5 (L) 26.0 - 34.0 pg   MCHC 30.3 30.0 - 36.0 g/dL   RDW 57.8 (H) 46.9 - 62.9 %   Platelets 449 (H) 150 - 400 K/uL   nRBC 0.0 0.0 - 0.2 %   Neutrophils Relative % 44 %   Neutro Abs 3.0  1.7 - 7.7 K/uL   Lymphocytes Relative 46 %   Lymphs Abs 3.1 0.7 - 4.0 K/uL   Monocytes Relative 8 %   Monocytes Absolute 0.5 0.1 - 1.0 K/uL   Eosinophils  Relative 1 %   Eosinophils Absolute 0.1 0.0 - 0.5 K/uL   Basophils Relative 1 %   Basophils Absolute 0.1 0.0 - 0.1 K/uL   Immature Granulocytes 0 %   Abs Immature Granulocytes 0.01 0.00 - 0.07 K/uL    Comment: Performed at [facility], [address], [place], [place] [address]  Comprehensive metabolic panel     Status: Abnormal   Collection Time: 07/19/21  3:59 AM  Result Value Ref Range   Sodium 140 135 - 145 mmol/L   Potassium 2.9 (L) 3.5 - 5.1 mmol/L   Chloride 107 98 - 111 mmol/L   CO2 26 22 - 32 mmol/L   Glucose, Bld 257 (H) 70 - 99 mg/dL    Comment: Glucose reference range applies only to samples taken after fasting for at least 8 hours.   BUN 6 6 - 20 mg/dL   Creatinine, Ser 0.76 0.44 - 1.00 mg/dL   Calcium 8.6 (L) 8.9 - 10.3 mg/dL   Total Protein 6.5 6.5 - 8.1 g/dL   Albumin 2.3 (L) 3.5 - 5.0 g/dL   AST 17 15 - 41 U/L   ALT 16 0 - 44 U/L   Alkaline Phosphatase 116 38 - 126 U/L   Total Bilirubin 0.2 (L) 0.3 - 1.2 mg/dL   GFR, Estimated >22 >63 mL/min    Comment: (NOTE) Calculated using the CKD-EPI Creatinine Equation (2021)    Anion gap 7 5 - 15    Comment: Performed at [facility], [address] [place], [place] [address]  Magnesium     Status: None   Collection Time: 07/19/21  3:59 AM  Result Value Ref Range   Magnesium 1.9 1.7 - 2.4 mg/dL    Comment: Performed at [facility], [address], [place], [place] [address]  Phosphorus     Status: None   Collection Time: 07/19/21  3:59 AM  Result Value Ref Range   Phosphorus 3.2 2.5 - 4.6 mg/dL    Comment: Performed at [facility], [address] [place], [place] [address]  Hemoglobin A1c     Status: Abnormal   Collection Time: 07/19/21  3:59 AM  Result Value Ref Range   Hgb A1c MFr Bld 14.2 (H) 4.8 - 5.6 %    Comment: (NOTE) Pre diabetes:          5.7%-6.4%  Diabetes:              >6.4%  Glycemic control for   <7.0% adults with diabetes    Mean Plasma Glucose 360.84 mg/dL     Comment: Performed at [facility], [address], [place], [place] [address]  Lipid panel     Status: Abnormal   Collection Time: 07/19/21  3:59 AM  Result Value Ref Range   Cholesterol 148 0 - 200 mg/dL   Triglycerides 811 <572 mg/dL   HDL 34 (L) >62 mg/dL   Total CHOL/HDL Ratio 4.4 RATIO   VLDL 24 0 - 40 mg/dL   LDL Cholesterol 90 0 - 99 mg/dL    Comment:        Total Cholesterol/HDL:CHD Risk Coronary Heart Disease Risk Table  Men   Women  1/2 Average Risk   3.4   3.3  Average Risk       5.0   4.4  2 X Average Risk   9.6   7.1  3 X Average Risk  23.4   11.0        Use the calculated Patient Ratio above and the CHD Risk Table to determine the patient's CHD Risk.        ATP III CLASSIFICATION (LDL):  <100     mg/dL   Optimal  409-811  mg/dL   Near or Above                    Optimal  130-159  mg/dL   Borderline  914-782  mg/dL   High  >956     mg/dL   Very High Performed at [facility], [address] [place], [place] [address]   CBG monitoring, ED     Status: Abnormal   Collection Time: 07/19/21  4:13 AM  Result Value Ref Range   Glucose-Capillary 233 (H) 70 - 99 mg/dL    Comment: Glucose reference range applies only to samples taken after fasting for at least 8 hours.   Comment 1 Notify RN    Comment 2 Document in Chart   CBG monitoring, ED     Status: Abnormal   Collection Time: 07/19/21  8:50 AM  Result Value Ref Range   Glucose-Capillary 136 (H) 70 - 99 mg/dL    Comment: Glucose reference range applies only to samples taken after fasting for at least 8 hours.   Comment 1 Notify RN    Comment 2 Document in Chart    CT ANGIO HEAD NECK W WO CM  Addendum Date: 07/19/2021   ADDENDUM REPORT: 07/19/2021 07:18 ADDENDUM: Study discussed by telephone with Drs. BHAGAT and Arora on 07/19/2021 at 0712 hours. Electronically Signed   By: Odessa Fleming M.D.   On: 07/19/2021 07:18   Result Date: 07/19/2021 CLINICAL DATA:  56 year old female with several  small infarcts scattered in the right cerebral hemisphere on MRI earlier today. History of HIV. EXAM: CT ANGIOGRAPHY HEAD AND NECK TECHNIQUE: Multidetector CT imaging of the head and neck was performed using the standard protocol during bolus administration of intravenous contrast. Multiplanar CT image reconstructions and MIPs were obtained to evaluate the vascular anatomy. Carotid stenosis measurements (when applicable) are obtained utilizing NASCET criteria, using the distal internal carotid diameter as the denominator. RADIATION DOSE REDUCTION: This exam was performed according to the departmental dose-optimization program which includes automated exposure control, adjustment of the mA and/or kV according to patient size and/or use of iterative reconstruction technique. CONTRAST:  50mL OMNIPAQUE IOHEXOL 350 MG/ML SOLN COMPARISON:  Brain MRI 0118 hours today.  Head CT yesterday. FINDINGS: Initial imaging attempt at 0446 hours with failed contrast bolus reportedly due to peripheral IV infiltration. The study was repeated with new IV access at 0638 hours. CTA NECK Skeleton: No acute osseous abnormality identified. Upper chest: Mild dependent atelectasis. Partially visible nonspecific mediastinal lymphadenopathy, increasing toward the level of the carina. Other neck: Negative; no neck mass or cervical lymphadenopathy identified. Aortic arch: Mild Calcified aortic atherosclerosis. 3 vessel arch configuration. Right carotid system: Tortuous right CCA origin without plaque or stenosis. Moderate soft and calcified plaque at the right ICA origin with 60 % stenosis with respect to the distal vessel. Right ICA remains patent to the skull base. Left carotid system: Negative left CCA aside  from mild tortuosity. Moderate soft and calcified plaque at the left ICA origin without stenosis. Vertebral arteries: Negative proximal right subclavian artery. Normal right vertebral artery origin. Some of the right V1 segment is mildly  obscured from venous contrast. But the right vertebral artery remains patent to the skull base with no significant stenosis identified. Negative proximal left subclavian artery. Calcified plaque at the left vertebral artery origin but no associated stenosis. Dominant appearing left vertebral artery is patent to the skull base without stenosis. CTA HEAD Posterior circulation: Dominant left V4 segment. No distal vertebral or vertebrobasilar junction plaque or stenosis. Normal PICA origins. Patent basilar artery without stenosis. Patent SCA and PCA origins. Posterior communicating arteries are diminutive or absent. Bilateral PCA branches are within normal limits. Anterior circulation: Patent ICA siphons but ICA tortuosity and calcified plaque bilaterally. On the right side there is a questionable filling defect in the supraclinoid ICA on series 12, image 113 with up to moderate stenosis there. Right ICA terminus, right MCA and ACA origin remain patent. On the left side there is moderate stenosis in the distal cavernous segment with patent left ICA terminus. Mild irregularity at the left MCA and ACA origins but no significant stenosis (series 16 image 20). Diminutive or absent anterior communicating artery. Bilateral ACA branches are within normal limits. Left MCA M1 segment and bifurcation are patent without stenosis. Left MCA branches are within normal limits. Right MCA M1 segment and bifurcation are patent without stenosis. Right MCA branches are within normal limits. Venous sinuses: Patent. Anatomic variants: Dominant left vertebral artery. Review of the MIP images confirms the above findings IMPRESSION: 1. Negative for large vessel occlusion. But positive for questionable filling defect - nonocclusive thrombus in the supraclinoid Right ICA (series 10, image 58). 2. Underlying age advanced atherosclerosis at both carotid bifurcations and ICA siphons. 60% Right ICA origin stenosis. Moderate stenosis of the Left ICA  cavernous segment. 3. Mild irregularity at the left MCA and ACA origins without significant stenosis. No posterior circulation stenosis. 4. 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 Recommend follow-up Chest CT with IV contrast. 5. Aortic Atherosclerosis (ICD10-I70.0). Electronically Signed: By: Odessa Fleming M.D. On: 07/19/2021 07:10   CT Head Wo Contrast  Result Date: 07/18/2021 CLINICAL DATA:  Seizure, new onset. No history of trauma. Seizure like activity. EXAM: CT HEAD WITHOUT CONTRAST TECHNIQUE: Contiguous axial images were obtained from the base of the skull through the vertex without intravenous contrast. RADIATION DOSE REDUCTION: This exam was performed according to the departmental dose-optimization program which includes automated exposure control, adjustment of the mA and/or kV according to patient size and/or use of iterative reconstruction technique. COMPARISON:  None Available. FINDINGS: Brain: No evidence of acute infarction, hemorrhage, hydrocephalus, extra-axial collection or mass lesion/mass effect. Vascular: No hyperdense vessel or unexpected calcification. Skull: Normal. Negative for fracture or focal lesion. Sinuses/Orbits: No acute finding. Other: None. IMPRESSION: No CT evidence of acute intracranial abnormality. Electronically Signed   By: Larose Hires D.O.   On: 07/18/2021 21:54   MR Brain W and Wo Contrast  Result Date: 07/19/2021 CLINICAL DATA:  Initial evaluation for new onset seizure. EXAM: MRI HEAD WITHOUT AND WITH CONTRAST TECHNIQUE: Multiplanar, multiecho pulse sequences of the brain and surrounding structures were obtained without and with intravenous contrast. CONTRAST:  6mL GADAVIST GADOBUTROL 1 MMOL/ML IV SOLN COMPARISON:  CT from 07/18/2021. FINDINGS: Brain: Cerebral volume within normal limits. Mild chronic microvascular ischemic disease noted involving the  periventricular white  matter. Few small acute ischemic infarcts seen involving the subcortical right cerebral hemisphere (series 3, images 38, 35, 27). Largest area of ischemia seen at the right periatrial white matter and measures up to 7 mm. No associated hemorrhage or mass effect. Gray-white matter differentiation otherwise maintained. No areas of chronic cortical infarction or other insult. No visible acute or chronic intracranial blood products. No mass lesion, midline shift or mass effect. No hydrocephalus or extra-axial fluid collection. Pituitary gland suprasellar region within normal limits. Midline structures intact and normally formed. No intrinsic temporal lobe abnormality. No abnormal enhancement. Vascular: Major intracranial vascular flow voids are grossly maintained at the skull base on this motion degraded exam. Skull and upper cervical spine: Craniocervical junction within normal limits. Bone marrow signal intensity normal. No scalp soft tissue abnormality. Sinuses/Orbits: Globes and orbital soft tissues within normal limits. Paranasal sinuses are largely clear. No significant mastoid effusion. Other: None. IMPRESSION: 1. 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 No associated hemorrhage or mass effect. 2. No other acute intracranial abnormality. 3. Underlying mild chronic microvascular ischemic disease. Electronically Signed   By: Rise Mu M.D.   On: 07/19/2021 03:01   CT ABDOMEN PELVIS W CONTRAST  Result Date: 07/18/2021 CLINICAL DATA:  Appendix removed on 06/22/2021. Evaluate for bowel obstruction. Abdominal pain. EXAM: CT ABDOMEN AND PELVIS WITH CONTRAST TECHNIQUE: Multidetector CT imaging of the abdomen and pelvis was performed using the standard protocol following bolus administration of intravenous contrast. RADIATION DOSE REDUCTION: This exam was performed according to the departmental dose-optimization program which includes  automated exposure control, adjustment of the mA and/or kV according to patient size and/or use of iterative reconstruction technique. CONTRAST:  OMNIPAQUE IOHEXOL 300 MG/ML  SOLN COMPARISON:  None Available. FINDINGS: Lower chest: No acute abnormality. Hepatobiliary: No focal liver abnormality is seen. No gallstones, gallbladder wall thickening, or biliary dilatation. Pancreas: Unremarkable. No pancreatic ductal dilatation or surrounding inflammatory changes. Spleen: Normal in size without focal abnormality. Adrenals/Urinary Tract: There is a rounded hypodensity in the left kidney which is too small to characterize, likely a cyst. Otherwise, the kidneys, adrenal glands, and bladder are within normal limits. Stomach/Bowel: Patient is status post appendectomy. There is an enhancing fluid collection containing air in the right paracolic gutter extending into the posterior right abdominal intramuscular wall. This collection measures 4.3 x 5.4 x 6.1 cm. Percutaneous drainage catheter is seen coiled in this collection. There is no bowel obstruction or free air. There is a large amount of stool throughout the colon. Stomach is within normal limits. Vascular/Lymphatic: No significant vascular findings are present. No enlarged abdominal or pelvic lymph nodes. Reproductive: Status post hysterectomy. No adnexal masses. Other: No free fluid or focal abdominal wall hernia. There is subcutaneous stranding at the level of the percutaneous catheter and mild edema of the intramuscular wall at this level as well. Musculoskeletal: No acute or significant osseous findings. IMPRESSION: 1. Patient is status post appendectomy. Enhancing fluid collection containing air in the right paracolic gutter extending into the adjacent intramuscular wall consistent with abscess. Percutaneous drainage catheter in place within this collection. 2. No evidence for bowel obstruction. Electronically Signed   By: Darliss Cheney M.D.   On: 07/18/2021  03:52   DG Chest Port 1 View  Result Date: 07/18/2021 CLINICAL DATA:  Syncope. EXAM: PORTABLE CHEST 1 VIEW COMPARISON:  Chest x-ray 01/30/2004 FINDINGS: The heart size and mediastinal contours are within normal limits. Both lungs are  clear. The visualized skeletal structures are unremarkable. IMPRESSION: No active disease. Electronically Signed   By: Darliss Cheney M.D.   On: 07/18/2021 01:32   EEG adult  Result Date: 07/19/2021 Charlsie Quest, MD     07/19/2021 10:59 AM Patient Name: MARICIA SCOTTI MRN: [id number] Epilepsy Attending: Charlsie Quest Referring Physician/Provider: Gordy Councilman, MD Date: 07/19/2021 Duration: 23.30 mins Patient history: 56yo F with new onset seizure. EEG to evaluate for seizure. Level of alertness: Awake, asleep AEDs during EEG study: LEV Technical aspects: This EEG study was done with scalp electrodes positioned according to the 10-20 International system of electrode placement. Electrical activity was acquired at a sampling rate of  and reviewed with a high frequency filter of  and a low frequency filter of . EEG data were recorded continuously and digitally stored. Description: The posterior dominant rhythm consists of 9-10 Hz activity of moderate voltage (25-35 uV) seen predominantly in posterior head regions, symmetric and reactive to eye opening and eye closing. Sleep was characterized by vertex waves, sleep spindles (12 to 14 Hz), maximal frontocentral region. EEG showed intermittent 3-5Hz  theta-delta slowing in bilateral temporal region. Hyperventilation and photic stimulation were not performed.   ABNORMALITY -Intermittent slow, bilateral temporal region IMPRESSION: This study is suggestive of independent bilateral temporal region, non specific etiology. No seizures or epileptiform discharges were seen throughout the recording. Priyanka Annabelle Harman    Anti-infectives (From admission, onward)    Start     Dose/Rate Route Frequency Ordered Stop   07/19/21  1045  ceFEPIme (MAXIPIME) 2 g in sodium chloride 0.9 % 100 mL IVPB        2 g 200 mL/hr over 30 Minutes Intravenous  Once 07/19/21 1033     07/19/21 1045  metroNIDAZOLE (FLAGYL) IVPB 500 mg        500 mg 100 mL/hr over 60 Minutes Intravenous  Once 07/19/21 1033         Assessment/Plan Hx of laparoscopic appendectomy on 06/19/21 by Dr. Monika Salk at [facility] Intra-abdominal Abscess S/p Perc Drain on 5/17 - This is a 56 y.o. female with a hx of recent laparoscopic appendectomy on 06/19/21 by Dr. Monika Salk at [facility] complicated by a large retrocecal abscess post-operatively with subsequent IR drainage on 5/17. CT A/P on admission showed enhancing fluid collection containing air in the right paracolic gutter extending into the adjacent intramuscular wall consistent with abscess with percutaneous drainage catheter in place within this collection. Patient is not a great historian but she states that she has had little to no output from this drain in several days. Will ask IR to evaluate. There is no indication for surgical intervention at this time. Pending IR recommendations patient needs to follow up with primary surgeon and IR. Primary team has already contacted ID for management of HIV; would ask them to also comment on abx selection while inpatient as well. She was on Augmentin and Ciprofloxacin at home.    FEN - CM diet, IVF per primary  VTE - SCDs, per primary ID - Cefepime/Flagyl  Seizures HIV DM2 HTN CKD3  I reviewed hospitalist notes, last 24 h vitals and pain scores, last 48 h intake and output, last 24 h labs and trends, and last 24 h imaging results   Carlena Bjornstad, [facility] 07/19/2021, 11:33 AM Please see Amion for pager number during day hours 7:00am-4:30pm

## 2021-07-19 NOTE — Progress Notes (Signed)
Pharmacy Antibiotic Note  Ashley Vaughn is a 56 y.o. female admitted on 07/18/2021 with  intra-abdominal infection .  Pharmacy has been consulted for Unasyn dosing.>>cefepime/flagyl  Recent lab appendectomy with drain Cultures grew Proteus (I-unasyn/tobra, R-ampicillin, gent, bactrim) and strep Was on ertapenem and discharged on augmentin + cipr0  Plan: Recent cultures showed proteus with intermediate susceptibility to unasyn Will change to cefepime 2gm q8hr/flagyl 500mg  q12hr for now Low threshold to change to zosyn with new seizure diagnosis Will monitor for acute changes in renal function and adjust as needed F/u cultures results and de-escalate as appropriate      Temp (24hrs), Avg:98.8 F (37.1 C), Min:98.3 F (36.8 C), Max:99.2 F (37.3 C)  Recent Labs  Lab 07/18/21 0010 07/18/21 1831 07/19/21 0359  WBC 9.5 7.2 6.7  CREATININE 1.11* 0.89 0.81    Estimated Creatinine Clearance: 69 mL/min (by C-G formula based on SCr of 0.81 mg/dL).    Allergies  Allergen Reactions   Fluorescein Itching and Rash    INTRAVENOUSLY   Thank you for allowing pharmacy to be a part of this patient's care.  09/18/21, PharmD Clinical Pharmacist  Please check AMION for all [facility] Pharmacy numbers After 10:00 PM, call Main Pharmacy [phone]

## 2021-07-19 NOTE — Procedures (Signed)
Patient Name: Ashley Vaughn  MRN: [id number]  Epilepsy Attending: Charlsie Quest  Referring Physician/Provider: Gordy Councilman, MD Date: 07/19/2021  Duration: 23.30 mins  Patient history: 56yo F with new onset seizure. EEG to evaluate for seizure.  Level of alertness: Awake, asleep  AEDs during EEG study: LEV  Technical aspects: This EEG study was done with scalp electrodes positioned according to the 10-20 International system of electrode placement. Electrical activity was acquired at a sampling rate of 500Hz  and reviewed with a high frequency filter of 70Hz  and a low frequency filter of 1Hz . EEG data were recorded continuously and digitally stored.   Description: The posterior dominant rhythm consists of 9-10 Hz activity of moderate voltage (25-35 uV) seen predominantly in posterior head regions, symmetric and reactive to eye opening and eye closing. Sleep was characterized by vertex waves, sleep spindles (12 to 14 Hz), maximal frontocentral region. EEG showed intermittent 3-5Hz  theta-delta slowing in bilateral temporal region. Hyperventilation and photic stimulation were not performed.     ABNORMALITY -Intermittent slow, bilateral temporal region  IMPRESSION: This study is suggestive of independent bilateral temporal region, non specific etiology. No seizures or epileptiform discharges were seen throughout the recording.  Kenson Groh 

## 2021-07-19 NOTE — ED Notes (Signed)
CT Tech informed RN of pts IV infiltrating. Eudelia Bunch, MD made aware

## 2021-07-19 NOTE — Progress Notes (Addendum)
STROKE TEAM PROGRESS NOTE   INTERVAL HISTORY Seen in ED, no family at the bedside. Patient describes "seizure like episodes" involving her neck turning and twitching. She states she remembers entire episode. EEG shows independent bilateral temporal slowing. She denies neck stiffness or headaches and does not have any nuchal rigidity on assessment. No focal neurological deficits.  Vitals:   07/19/21 0730 07/19/21 0800 07/19/21 0830 07/19/21 0900  BP: (!) 175/99 (!) 156/92 (!) 159/91 (!) 166/92  Pulse: 99 86 81 88  Resp: 11 16 15 16   Temp:      TempSrc:      SpO2: 100% 100% 100% 100%   CBC:  Recent Labs  Lab 07/18/21 0010 07/18/21 0158 07/18/21 1831 07/19/21 0359  WBC 9.5  --  7.2 6.7  NEUTROABS 5.3  --   --  3.0  HGB 8.0*   < > 8.4* 8.1*  HCT 25.5*   < > 28.8* 26.7*  MCV 70.8*  --  72.2* 70.8*  PLT 515*  --  504* 449*   < > = values in this interval not displayed.   Basic Metabolic Panel:  Recent Labs  Lab 07/18/21 1831 07/19/21 0359  NA 140 140  K 3.2* 2.9*  CL 108 107  CO2 22 26  GLUCOSE 324* 257*  BUN 9 6  CREATININE 0.89 0.81  CALCIUM 8.7* 8.6*  MG 1.9 1.9  PHOS  --  3.2   Lipid Panel:  Recent Labs  Lab 07/19/21 0359  CHOL 148  TRIG 118  HDL 34*  CHOLHDL 4.4  VLDL 24  LDLCALC 90   HgbA1c:  Recent Labs  Lab 07/19/21 0359  HGBA1C 14.2*   Urine Drug Screen: No results for input(s): LABOPIA, COCAINSCRNUR, LABBENZ, AMPHETMU, THCU, LABBARB in the last 168 hours.  Alcohol Level No results for input(s): ETH in the last 168 hours.  IMAGING past 24 hours CT ANGIO HEAD NECK W WO CM  Addendum Date: 07/19/2021   ADDENDUM REPORT: 07/19/2021 07:18 ADDENDUM: Study discussed by telephone with Drs. BHAGAT and Arora on 07/19/2021 at 0712 hours. Electronically Signed   By: Odessa FlemingH  Hall M.D.   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 History  of HIV. EXAM: CT ANGIOGRAPHY HEAD AND NECK TECHNIQUE: Multidetector CT imaging of the head and neck was performed using the standard protocol during bolus administration of intravenous contrast. Multiplanar CT image reconstructions and MIPs were obtained to evaluate the vascular anatomy. Carotid stenosis measurements (when applicable) are obtained utilizing NASCET criteria, using the distal internal carotid diameter as the denominator. RADIATION DOSE REDUCTION: This exam was performed according to the departmental dose-optimization program which includes automated exposure control, adjustment of the mA and/or kV according to patient size and/or use of iterative reconstruction technique. CONTRAST:  50mL OMNIPAQUE IOHEXOL 350 MG/ML SOLN COMPARISON:  Brain MRI 0118 hours today.  Head CT yesterday. FINDINGS: Initial imaging attempt at 0446 hours with failed contrast bolus reportedly due to peripheral IV infiltration. The study was repeated with new IV access at 0638 hours. CTA NECK Skeleton: No acute osseous abnormality identified. Upper chest: Mild dependent atelectasis. Partially visible nonspecific mediastinal lymphadenopathy, increasing toward the level of the carina. Other neck: Negative; no neck mass or cervical lymphadenopathy identified. Aortic arch: Mild Calcified aortic atherosclerosis. 3 vessel arch configuration. Right carotid system: Tortuous right CCA origin without plaque or stenosis. Moderate soft and calcified plaque  at the right ICA origin with 60 % stenosis with respect to the distal vessel. Right ICA remains patent to the skull base. Left carotid system: Negative left CCA aside from mild tortuosity. Moderate soft and calcified plaque at the left ICA origin without stenosis. Vertebral arteries: Negative proximal right subclavian artery. Normal right vertebral artery origin. Some of the right V1 segment is mildly obscured from venous contrast. But the right vertebral artery remains patent to the skull  base with no significant stenosis identified. Negative proximal left subclavian artery. Calcified plaque at the left vertebral artery origin but no associated stenosis. Dominant appearing left vertebral artery is patent to the skull base without stenosis. CTA HEAD Posterior circulation: Dominant left V4 segment. No distal vertebral or vertebrobasilar junction plaque or stenosis. Normal PICA origins. Patent basilar artery without stenosis. Patent SCA and PCA origins. Posterior communicating arteries are diminutive or absent. Bilateral PCA branches are within normal limits. Anterior circulation: Patent ICA siphons but ICA tortuosity and calcified plaque bilaterally. On the right side there is a questionable filling defect in the supraclinoid ICA on series 12, image 113 with up to moderate stenosis there. Right ICA terminus, right MCA and ACA origin remain patent. On the left side there is moderate stenosis in the distal cavernous segment with patent left ICA terminus. Mild irregularity at the left MCA and ACA origins but no significant stenosis (series 16 image 20). Diminutive or absent anterior communicating artery. Bilateral ACA branches are within normal limits. Left MCA M1 segment and bifurcation are patent without stenosis. Left MCA branches are within normal limits. Right MCA M1 segment and bifurcation are patent without stenosis. Right MCA branches are within normal limits. Venous sinuses: Patent. Anatomic variants: Dominant left vertebral artery. Review of the MIP images confirms the above findings IMPRESSION: 1. Negative for large vessel occlusion. But positive for questionable filling defect - nonocclusive thrombus in the supraclinoid Right ICA (series 10, image 58). 2. Underlying age advanced atherosclerosis at both carotid bifurcations and ICA siphons. 60% Right ICA origin stenosis. Moderate stenosis of the Left ICA cavernous segment. 3. Mild irregularity at the left MCA and ACA origins without significant  stenosis. No posterior circulation stenosis. 4. 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 Recommend follow-up Chest CT with IV contrast. 5. Aortic Atherosclerosis (ICD10-I70.0). Electronically Signed: By: Odessa Fleming M.D. On: 07/19/2021 07:10   CT Head Wo Contrast  Result Date: 07/18/2021 CLINICAL DATA:  Seizure, new onset. No history of trauma. Seizure like activity. EXAM: CT HEAD WITHOUT CONTRAST TECHNIQUE: Contiguous axial images were obtained from the base of the skull through the vertex without intravenous contrast. RADIATION DOSE REDUCTION: This exam was performed according to the departmental dose-optimization program which includes automated exposure control, adjustment of the mA and/or kV according to patient size and/or use of iterative reconstruction technique. COMPARISON:  None Available. FINDINGS: Brain: No evidence of acute infarction, hemorrhage, hydrocephalus, extra-axial collection or mass lesion/mass effect. Vascular: No hyperdense vessel or unexpected calcification. Skull: Normal. Negative for fracture or focal lesion. Sinuses/Orbits: No acute finding. Other: None. IMPRESSION: No CT evidence of acute intracranial abnormality. Electronically Signed   By: Larose Hires D.O.   On: 07/18/2021 21:54   MR Brain W and Wo Contrast  Result Date: 07/19/2021 CLINICAL DATA:  Initial evaluation for new onset seizure. EXAM: MRI HEAD WITHOUT AND WITH CONTRAST TECHNIQUE: Multiplanar, multiecho pulse sequences of the brain and surrounding structures were obtained without and with intravenous  contrast. CONTRAST:  22mL GADAVIST GADOBUTROL 1 MMOL/ML IV SOLN COMPARISON:  CT from 07/18/2021. FINDINGS: Brain: Cerebral volume within normal limits. Mild chronic microvascular ischemic disease noted involving the periventricular white matter. Few small acute ischemic infarcts seen involving the subcortical right cerebral  hemisphere (series 3, images 38, 35, 27). Largest area of ischemia seen at the right periatrial white matter and measures up to 7 mm. No associated hemorrhage or mass effect. Gray-white matter differentiation otherwise maintained. No areas of chronic cortical infarction or other insult. No visible acute or chronic intracranial blood products. No mass lesion, midline shift or mass effect. No hydrocephalus or extra-axial fluid collection. Pituitary gland suprasellar region within normal limits. Midline structures intact and normally formed. No intrinsic temporal lobe abnormality. No abnormal enhancement. Vascular: Major intracranial vascular flow voids are grossly maintained at the skull base on this motion degraded exam. Skull and upper cervical spine: Craniocervical junction within normal limits. Bone marrow signal intensity normal. No scalp soft tissue abnormality. Sinuses/Orbits: Globes and orbital soft tissues within normal limits. Paranasal sinuses are largely clear. No significant mastoid effusion. Other: None. IMPRESSION: 1. 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 No associated hemorrhage or mass effect. 2. No other acute intracranial abnormality. 3. Underlying mild chronic microvascular ischemic disease. Electronically Signed   By: Rise Mu M.D.   On: 07/19/2021 03:01    PHYSICAL EXAM  Physical Exam  Constitutional: Appears well-developed and well-nourished.  Cardiovascular: Normal rate and regular rhythm.  Respiratory: Effort normal, non-labored breathing  Neuro: Mental Status: Patient is awake, alert, oriented to person, place, month, year, and situation. Patient is able to give a clear and coherent history. No signs of aphasia or neglect Cranial Nerves: II: Visual Fields are full. Pupils are equal, round, and reactive to light.   III,IV, VI: EOMI without ptosis or diploplia. Gaze is midline. V: Facial  sensation is symmetric to temperature VII: Facial movement is symmetric resting and smiling VIII: Hearing is intact to voice X: Palate elevates symmetrically XI: Shoulder shrug is symmetric. XII: Tongue protrudes midline without atrophy or fasciculations.  Motor: Tone is normal. Bulk is normal. 5/5 strength was present in all four extremities.  Sensory: Sensation is symmetric to light touch and temperature in the arms and legs. No extinction to DSS present.  Cerebellar: FNF and HKS are intact bilaterally   ASSESSMENT/PLAN Ms. SANTIA LABATE is a 55 y.o. female with history of  hypertension, type 2 diabetes, HIV, HSV, recent appendectomy complicated by intra-abdominal abscess requiring percutaneous drainage and antibiotics, who presented to [facility] ED due to seizure-like activity at home and had one episode in ED. She was also in the ED 5/30 for a syncopal episode preceded by nausea and vomiting. MRI shows scattered acute ischemic infarcts involving the subcortical right cerebral hemisphere. No focal neurological deficits.   Stroke:  right occipital WM, right MCA/ACA 3 small acute infarcts, etiology unclear, concerning for cardioembolic source from low EF, especially with potential nonocclusive thrombus at R ICA terminus Code Stroke CT head No acute abnormality.  CTA head & neck- positive for questionable filling defect - nonocclusive thrombus in the supraclinoid Right ICA. Underlying age advanced atherosclerosis at both carotid bifurcations and ICA siphons. 60% Right ICA origin stenosis (at most 40% on my calculation). Moderate stenosis of the Left ICA cavernous segment MRI  Few scattered subcentimeter acute ischemic infarcts involving the subcortical right cerebral hemisphere as above.  2D Echo EF < 20% Vas duplex  DVT- no evidence of DVT LDL 90 HgbA1c 14.2 UDS pending VTE prophylaxis - SCDs No antithrombotic prior to admission, now on aspirin 81 mg daily and clopidogrel 75 mg daily. May need to  consider anticoagulation.  Therapy recommendations:  Pending Disposition:  Pending  Seizure like activity  Neck shaking lasting 10 min per pt without LOC Inconsistent report of arm shaking EEG-independent bilateral temporal slowing, no seizure On keppra now s/p loading Can switch to po once stable  Cardiomyopathy  TTE showed EF < 20% Recommend cardiology consult Given low EF, embolic strokes and nonocclusive ICA termius thrombus, will recommend anticoagulation. Ideally with eliquis as coumadin may interfere with her HIV meds. Will discuss with Child psychotherapist, pharmacist and cardiology in am  Hypertension Stable on the high end Gradually normalized BP in 2-3 days Long-term BP goal normotensive  Hyperlipidemia LDL 90, goal < 70 Add Crestor   Continue statin at discharge  Diabetes type II Uncontrolled Home meds:  Insulin HgbA1c 14.2, goal < 7.0 CBGs hyperglycemia SSI Close PCP follow up for better DM control  Other Active Problems Hypokalemia K 2.9 -> supplement Anemia Hb 8.1 HIV on HARRT - reported in good control Abdominal abscess CT Abd- Patient is status post appendectomy. Enhancing fluid collection containing air in the right paracolic gutter extending into the adjacent intramuscular wall consistent with abscess. Percutaneous drainage catheter in place within this collection. Gen surgery consulted for management  Augmentin and cipro at home -> now on flagyl and cefepime  Hospital day # 1  Patient seen and examined by NP/APP with MD. MD to update note as needed.   Elmer Picker, DNP, FNP-BC [facility] Pager: [phone]  ATTENDING NOTE: I reviewed above note and agree with the assessment and plan. Pt was seen and examined.   56 year old female with history of hypertension, diabetes, HIV, recent appendicectomy with abdominal abscess status post drainage on antibiotics admitted for syncope episode, nausea vomiting with shaking jerking movement.   Status post Ativan and Keppra.  CT no acute abnormality.  MRI showed right occipital white matter periventricular, and right MCA/ACA 3 small infarcts.  CT head and neck possible right ICA siphon nonocclusive thrombus, right ICA bulb 40% stenosis, atherosclerosis bilateral ICA bulb and siphon.  EF less than 20%, LDL 90, A1c 14.2.  UDS pending.  EEG no seizure.  Creatinine 0.81, potassium 2.9, hemoglobin 8.1.  On exam, patient neurologically intact, no focal deficit.  Etiology of patient's stroke concerning for cardioembolic source given low EF, possible right ICA siphon nonocclusive thrombus.  Patient does have multiple uncontrolled risk factors especially very poorly controlled diabetes.  Recommend cardiology consult and consider anticoagulation.  Keep DAPT for now, will discuss with cardiology, social worker and pharmacist in a.m.  Continue statin  Seizure-like activity concerning for seizure but not certain, currently on Keppra stat post loading, consider transition to p.o. once stable.  Surgery on board for abdominal abscess, currently on Flagyl and cefepime.  Given history of HIV, ID consult is recommended by surgical team.  Hypokalemia with K 2.9, primary team to supplement.  For detailed assessment and plan, please refer to above as I have made changes wherever appropriate.   Marvel Plan, MD PhD Stroke Neurology 07/19/2021 6:16 PM  I discussed with Dr. Margo Aye. I spent extensive time with the patient, more than 50% of which was spent in counseling and coordination of care, reviewing test results, images and medication, and discussing the diagnosis, treatment plan and potential prognosis. This patient's care requiresreview of  multiple databases, neurological assessment, discussion with family, other specialists and medical decision making of high complexity.    To contact Stroke Continuity provider, please refer to [email or web address]. After hours, contact General Neurology

## 2021-07-19 NOTE — Progress Notes (Signed)
Internal Medicine Clinic Attending ° °Case discussed with Dr. DeMaio  At the time of the visit.  We reviewed the resident’s history and exam and pertinent patient test results.  I agree with the assessment, diagnosis, and plan of care documented in the resident’s note. ° ° °

## 2021-07-19 NOTE — Consult Note (Signed)
Patient Status: [facility] - Out-pt  Assessment and Plan: Intra-abdominal fluid collection s/p 10Fr drain placement by Dr. Elby Showers at [facility].  Patient brought to [facility] ED by her daughter.  She has a 10Fr intra-abdominal drain placement, placed 5/17 by Dr. Elby Showers.  Drain flushes with slight resistance, but does not aspirate.  CT imaging shows ongoing fluid collection.  IR consulted for drain assessment/exchange.  Risks and benefits discussed with the patient including bleeding, infection, damage to adjacent structures.  All of the patient's questions were answered, patient is agreeable to proceed. Consent signed and in chart.  ______________________________________________________________________   History of Present Illness: Ashley Vaughn is a 56 y.o. female with history of appendicitis s/p appendectomy with subsequent intra-abdominal fluid collection development.  She underwent drain placement 5/17 at [facility].  She now presents with seizures, abdominal pain.  Surgery has assessed and CT ordered which shows small residual fluid collection. Daughter states there is no output at home. They have not been flushing.   Allergies and medications reviewed.   Review of Systems: A 12 point ROS discussed and pertinent positives are indicated in the HPI above.  All other systems are negative.  Review of Systems  Constitutional:  Negative for fatigue and fever.  Respiratory:  Negative for cough and shortness of breath.   Cardiovascular:  Negative for chest pain.  Gastrointestinal:  Positive for abdominal pain. Negative for nausea and vomiting.  Musculoskeletal:  Negative for back pain.  Neurological:  Positive for seizures.  Psychiatric/Behavioral:  Negative for behavioral problems.    Vital Signs: BP (!) 145/94 (BP Location: Left Arm)   Pulse 89   Temp 98.2 F (36.8 C) (Oral)   Resp 18   SpO2 100%   Physical Exam Vitals and nursing note reviewed.  Constitutional:      General: She is not in  acute distress.    Appearance: Normal appearance. She is not ill-appearing.  HENT:     Mouth/Throat:     Mouth: Mucous membranes are moist.     Pharynx: Oropharynx is clear.  Cardiovascular:     Rate and Rhythm: Normal rate and regular rhythm.  Pulmonary:     Effort: Pulmonary effort is normal.     Breath sounds: Normal breath sounds.  Abdominal:     General: Abdomen is flat.     Palpations: Abdomen is soft.     Comments: RLQ drain in place.  Insertion site I/c/d.  Flushes with some resistance, does not aspirate. Cloudy, yellow fluid in bulb.   Skin:    General: Skin is warm and dry.  Neurological:     General: No focal deficit present.     Mental Status: She is alert and oriented to person, place, and time.     Imaging reviewed.   Labs:  COAGS: No results for input(s): INR, APTT in the last 8760 hours.  BMP: Recent Labs    12/21/20 1145 05/04/21 0903 07/18/21 0010 07/18/21 0158 07/18/21 1831 07/19/21 0359  NA 128* 133* 137 139 140 140  K 4.8 4.1 4.0 3.4* 3.2* 2.9*  CL 93* 97* 104  --  108 107  CO2 25 25 22   --  22 26  GLUCOSE 540* 524* 509*  --  324* 257*  BUN 17 24 13   --  9 6  CALCIUM 9.4 9.5 8.4*  --  8.7* 8.6*  CREATININE 1.30* 1.42* 1.11*  --  0.89 0.81  GFRNONAA 49*  --  59*  --  >60 >60  Electronically Signed: Hoyt Koch, PA 07/19/2021, 4:25 PM   I spent a total of 15 minutes in face to face in clinical consultation, greater than 50% of which was counseling/coordinating care for intra-abdominal fluid collection.

## 2021-07-19 NOTE — ED Notes (Signed)
Patient still in MRI at this time

## 2021-07-19 NOTE — H&P (Signed)
History and Physical  Ashley Vaughn HKV:[id number] DOB: 11/14/1965 DOA: 07/18/2021  Referring physician: Dr. Maryan Rued, [place] PCP: Orvis Brill, MD  Outpatient Specialists: Nephrology Patient coming from: Home.  Chief Complaint: Seizure activity    HPI: Ashley Vaughn is a 56 y.o. female with medical history significant for hypertension, type 2 diabetes, HIV, HSV, recent appendectomy complicated by intra-abdominal abscess requiring percutaneous drainage and antibiotics, who presented to [facility] ED due to seizure-like activity at home.  Unable to obtain a history from the patient due to somnolence, after receiving 1 mg of IV Ativan.  No reported headache or vision changes.  On 07/17/2021 she presented to the ED due to syncopal episode preceded by nausea vomiting and shaking without tongue biting or urinary incontinence.  Her workup in the ED was unrevealing and she was discharged home.  She returns on 07/18/21 due to witnessed seizures at home with retained consciousness.  The seizures lasted about 30 seconds each.  Described as head turning to the left, shaking of the left arm and rigidity of the right arm with retained consciousness.  Seen by neurology, recommended MRI brain with and without contrast and possible LP.  The patient was started on IV Keppra in the ED.  TRH, hospitalist service, asked to admit.  Patient's IV infiltrated while getting IV contrast, delaying MRI.  ED Course: Tmax 99.2.  BP 146/88, pulse 88.  Respiratory 16, O2 saturation 100% on room air.  Lab studies remarkable for serum potassium 3.2, glucose 324.  Serum magnesium 1.9.Hemoglobin 8.4, MCV 72, platelet count 504.  Review of Systems: Review of systems as noted in the HPI. All other systems reviewed and are negative.   Past Medical History:  Diagnosis Date   Depression    Diabetes mellitus    History of positive PPD, treatment status unknown    HIV infection ([place])    Past Surgical History:  Procedure Laterality Date    ABDOMINAL HYSTERECTOMY      Social History:  reports that she has never smoked. She has been exposed to tobacco smoke. She has never used smokeless tobacco. She reports current alcohol use. She reports that she does not use drugs.   Allergies  Allergen Reactions   Fluorescein Itching and Rash    INTRAVENOUSLY    Family History  Problem Relation Age of Onset   Diabetes Mother    Hypertension Mother    Heart disease Father    Arthritis Father    Diabetes Maternal Uncle       Prior to Admission medications   Medication Sig Start Date End Date Taking? Authorizing Provider  Accu-Chek Softclix Lancets lancets Check blood sugar 3 times per day 05/23/21   Scarlett Presto, MD  amoxicillin-clavulanate (AUGMENTIN) 875-125 MG tablet Take 1 tablet by mouth See admin instructions. Bid x 14 days 07/12/21   [provider]  B-D UF III MINI PEN NEEDLES 31G X 5 MM MISC Use to inject insulin one time daily 07/18/21   Demaio, Alexa, MD  Blood Glucose Monitoring Suppl (ACCU-CHEK GUIDE ME) w/Device KIT 1 kit by Does not apply route daily. Patient not taking: Reported on 03/16/2021 09/30/19   Kuppelweiser, Cassie L, RPH-CPP  Blood Glucose Monitoring Suppl (ACCU-CHEK NANO SMARTVIEW) w/Device KIT 1 each by Does not apply route 2 (two) times daily. Check blood sugar 4x a day before meals and bedtime  2 days each week. Dx code- 250.00. 07/18/21   Scarlett Presto, MD  Cholecalciferol (VITAMIN D3) 5000 units TABS Take  5,000 Units by mouth daily.    [provider]  ciprofloxacin (CIPRO) 750 MG tablet Take 750 mg by mouth See admin instructions. Bid x 14 days 07/12/21   [provider]  DULoxetine (CYMBALTA) 60 MG capsule Take 1 capsule (60 mg total) by mouth daily. 05/23/21 07/18/21  Demaio, Alexa, MD  Empagliflozin-metFORMIN HCl (SYNJARDY) 5-500 MG TABS Take 1 tablet by mouth 2 (two) times daily. 07/18/21   Scarlett Presto, MD  emtricitabine-rilpivir-tenofovir AF (ODEFSEY) 200-25-25 MG TABS tablet  Take 1 tablet by mouth daily with breakfast. 07/18/21   Demaio, Alexa, MD  glucose blood (ACCU-CHEK GUIDE) test strip Check blood sugar 3 times per day 07/18/21   Demaio, Alexa, MD  insulin glargine (LANTUS SOLOSTAR) 100 UNIT/ML Solostar Pen Inject 30 Units into the skin daily. 07/18/21 02/03/22  Demaio, Alexa, MD  Lancets Misc. (ACCU-CHEK SOFTCLIX LANCET DEV) KIT Use to check blood sugar up to 3 times a day before meals. 07/18/21   Demaio, Alexa, MD  losartan-hydrochlorothiazide (HYZAAR) 50-12.5 MG tablet Take 1 tablet by mouth daily. 05/08/21   Lajean Manes, MD  ondansetron (ZOFRAN-ODT) 4 MG disintegrating tablet Take 1 tablet (4 mg total) by mouth every 8 (eight) hours as needed for up to 3 days for nausea or vomiting. 07/18/21 07/21/21  Fatima Blank, MD    Physical Exam: BP (!) 146/88   Pulse 88   Temp 98.3 F (36.8 C) (Oral)   Resp 16   SpO2 100%   General: 56 y.o. year-old female well developed well nourished in no acute distress.  Somnolent but arouses to voices. Cardiovascular: Regular rate and rhythm with no rubs or gallops.  No thyromegaly or JVD noted.  No lower extremity edema. 2/4 pulses in all 4 extremities. Respiratory: Clear to auscultation with no wheezes or rales. Poor inspiratory effort. Abdomen: Soft nontender nondistended with normal bowel sounds x4 quadrants. Muskuloskeletal: No cyanosis, clubbing or edema noted bilaterally Neuro: CN II-XII intact, strength, sensation, reflexes Skin: No ulcerative lesions noted or rashes Psychiatry: Judgement, mood, and insight, unable to assess due to somnolence.           Labs on Admission:  Basic Metabolic Panel: Recent Labs  Lab 07/18/21 0010 07/18/21 0158 07/18/21 1831  NA 137 139 140  K 4.0 3.4* 3.2*  CL 104  --  108  CO2 22  --  22  GLUCOSE 509*  --  324*  BUN 13  --  9  CREATININE 1.11*  --  0.89  CALCIUM 8.4*  --  8.7*  MG  --   --  1.9   Liver Function Tests: Recent Labs  Lab 07/18/21 0010  AST 30   ALT 16  ALKPHOS 134*  BILITOT 0.6  PROT 6.7  ALBUMIN 2.4*   No results for input(s): LIPASE, AMYLASE in the last 168 hours. No results for input(s): AMMONIA in the last 168 hours. CBC: Recent Labs  Lab 07/18/21 0010 07/18/21 0158 07/18/21 1831  WBC 9.5  --  7.2  NEUTROABS 5.3  --   --   HGB 8.0* 8.8* 8.4*  HCT 25.5* 26.0* 28.8*  MCV 70.8*  --  72.2*  PLT 515*  --  504*   Cardiac Enzymes: No results for input(s): CKTOTAL, CKMB, CKMBINDEX, TROPONINI in the last 168 hours.  BNP (last 3 results) No results for input(s): BNP in the last 8760 hours.  ProBNP (last 3 results) No results for input(s): PROBNP in the last 8760 hours.  CBG: Recent Labs  Lab 07/18/21 0001 07/18/21 0425 07/18/21 0654 07/18/21 1836 07/19/21 0258  GLUCAP 526* 432* 198* 314* 239*    Radiological Exams on Admission: CT Head Wo Contrast  Result Date: 07/18/2021 CLINICAL DATA:  Seizure, new onset. No history of trauma. Seizure like activity. EXAM: CT HEAD WITHOUT CONTRAST TECHNIQUE: Contiguous axial images were obtained from the base of the skull through the vertex without intravenous contrast. RADIATION DOSE REDUCTION: This exam was performed according to the departmental dose-optimization program which includes automated exposure control, adjustment of the mA and/or kV according to patient size and/or use of iterative reconstruction technique. COMPARISON:  None Available. FINDINGS: Brain: No evidence of acute infarction, hemorrhage, hydrocephalus, extra-axial collection or mass lesion/mass effect. Vascular: No hyperdense vessel or unexpected calcification. Skull: Normal. Negative for fracture or focal lesion. Sinuses/Orbits: No acute finding. Other: None. IMPRESSION: No CT evidence of acute intracranial abnormality. Electronically Signed   By: Keane Police D.O.   On: 07/18/2021 21:54   MR Brain W and Wo Contrast  Result Date: 07/19/2021 CLINICAL DATA:  Initial evaluation for new onset seizure. EXAM:  MRI HEAD WITHOUT AND WITH CONTRAST TECHNIQUE: Multiplanar, multiecho pulse sequences of the brain and surrounding structures were obtained without and with intravenous contrast. CONTRAST:  74mL GADAVIST GADOBUTROL 1 MMOL/ML IV SOLN COMPARISON:  CT from 07/18/2021. FINDINGS: Brain: Cerebral volume within normal limits. Mild chronic microvascular ischemic disease noted involving the periventricular white matter. Few small acute ischemic infarcts seen involving the subcortical right cerebral hemisphere (series 3, images 38, 35, 27). Largest area of ischemia seen at the right periatrial white matter and measures up to 7 mm. No associated hemorrhage or mass effect. Gray-white matter differentiation otherwise maintained. No areas of chronic cortical infarction or other insult. No visible acute or chronic intracranial blood products. No mass lesion, midline shift or mass effect. No hydrocephalus or extra-axial fluid collection. Pituitary gland suprasellar region within normal limits. Midline structures intact and normally formed. No intrinsic temporal lobe abnormality. No abnormal enhancement. Vascular: Major intracranial vascular flow voids are grossly maintained at the skull base on this motion degraded exam. Skull and upper cervical spine: Craniocervical junction within normal limits. Bone marrow signal intensity normal. No scalp soft tissue abnormality. Sinuses/Orbits: Globes and orbital soft tissues within normal limits. Paranasal sinuses are largely clear. No significant mastoid effusion. Other: None. IMPRESSION: 1. 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 No associated hemorrhage or mass effect. 2. No other acute intracranial abnormality. 3. Underlying mild chronic microvascular ischemic disease. Electronically Signed   By: Jeannine Boga M.D.   On: 07/19/2021 03:01   CT ABDOMEN PELVIS W CONTRAST  Result Date: 07/18/2021 CLINICAL DATA:   Appendix removed on 06/22/2021. Evaluate for bowel obstruction. Abdominal pain. EXAM: CT ABDOMEN AND PELVIS WITH CONTRAST TECHNIQUE: Multidetector CT imaging of the abdomen and pelvis was performed using the standard protocol following bolus administration of intravenous contrast. RADIATION DOSE REDUCTION: This exam was performed according to the departmental dose-optimization program which includes automated exposure control, adjustment of the mA and/or kV according to patient size and/or use of iterative reconstruction technique. CONTRAST:  128mL OMNIPAQUE IOHEXOL 300 MG/ML  SOLN COMPARISON:  None Available. FINDINGS: Lower chest: No acute abnormality. Hepatobiliary: No focal liver abnormality is seen. No gallstones, gallbladder wall thickening, or biliary dilatation. Pancreas: Unremarkable. No pancreatic ductal dilatation or surrounding inflammatory changes. Spleen: Normal in size without focal abnormality. Adrenals/Urinary Tract: There is a rounded hypodensity in the left kidney which is  too small to characterize, likely a cyst. Otherwise, the kidneys, adrenal glands, and bladder are within normal limits. Stomach/Bowel: Patient is status post appendectomy. There is an enhancing fluid collection containing air in the right paracolic gutter extending into the posterior right abdominal intramuscular wall. This collection measures 4.3 x 5.4 x 6.1 cm. Percutaneous drainage catheter is seen coiled in this collection. There is no bowel obstruction or free air. There is a large amount of stool throughout the colon. Stomach is within normal limits. Vascular/Lymphatic: No significant vascular findings are present. No enlarged abdominal or pelvic lymph nodes. Reproductive: Status post hysterectomy. No adnexal masses. Other: No free fluid or focal abdominal wall hernia. There is subcutaneous stranding at the level of the percutaneous catheter and mild edema of the intramuscular wall at this level as well. Musculoskeletal:  No acute or significant osseous findings. IMPRESSION: 1. Patient is status post appendectomy. Enhancing fluid collection containing air in the right paracolic gutter extending into the adjacent intramuscular wall consistent with abscess. Percutaneous drainage catheter in place within this collection. 2. No evidence for bowel obstruction. Electronically Signed   By: Ronney Asters M.D.   On: 07/18/2021 03:52   DG Chest Port 1 View  Result Date: 07/18/2021 CLINICAL DATA:  Syncope. EXAM: PORTABLE CHEST 1 VIEW COMPARISON:  Chest x-ray 01/30/2004 FINDINGS: The heart size and mediastinal contours are within normal limits. Both lungs are clear. The visualized skeletal structures are unremarkable. IMPRESSION: No active disease. Electronically Signed   By: Ronney Asters M.D.   On: 07/18/2021 01:32    EKG: I independently viewed the EKG done and my findings are as followed: Sinus tach rate of 101.  Nonspecific ST-T changes.  QTc 497.  Assessment/Plan Present on Admission: **None**  Principal Problem:   Seizure ([place])   Seizure-like activity with retained consciousness Manifested as head turning to the left, left arm with movement while maintaining consciousness. Seen by neurology.  Recommended MRI brain with and without contrast. IV infiltrated while getting IV contrast, delaying MRI Started on Keppra in the ED Obtain EEG IV Ativan as needed for breakthrough seizures Seizures precautions  Type 2 diabetes with hyperglycemia Obtain hemoglobin A1c Insulin sliding scale.  Anemia of chronic disease Hemoglobin 8.4, MCV 72, platelet count 504  Hypokalemia Repleted orally.  Prolonged QTc Admission 12 lead EKG showing QTc 497 Avoid QTc prolonging agents Optimize magnesium and potassium levels Repeat twelve-lead EKG in the morning.    DVT prophylaxis: Subcu heparin 3 times daily  Code Status: Full code  Family Communication: None at bedside  Disposition Plan: Admitted to telemetry medical  unit  Consults called: Neurology  Admission status: Inpatient status.   Status is: Inpatient Patient requires at least 2 midnight for further evaluation and treatment of present condition.   Kayleen Memos MD [facility] Pager [phone]  If 7PM-7AM, please contact night-coverage [email or web address] Password [email or web address]  07/19/2021, 3:22 AM

## 2021-07-20 DIAGNOSIS — K651 Peritoneal abscess: Secondary | ICD-10-CM

## 2021-07-20 DIAGNOSIS — T8149XA Infection following a procedure, other surgical site, initial encounter: Secondary | ICD-10-CM

## 2021-07-20 DIAGNOSIS — B20 Human immunodeficiency virus [HIV] disease: Secondary | ICD-10-CM | POA: Diagnosis not present

## 2021-07-20 DIAGNOSIS — T8143XA Infection following a procedure, organ and space surgical site, initial encounter: Secondary | ICD-10-CM

## 2021-07-20 DIAGNOSIS — I255 Ischemic cardiomyopathy: Secondary | ICD-10-CM | POA: Diagnosis not present

## 2021-07-20 DIAGNOSIS — I429 Cardiomyopathy, unspecified: Secondary | ICD-10-CM

## 2021-07-20 DIAGNOSIS — I639 Cerebral infarction, unspecified: Secondary | ICD-10-CM

## 2021-07-20 DIAGNOSIS — R569 Unspecified convulsions: Secondary | ICD-10-CM | POA: Diagnosis not present

## 2021-07-20 DIAGNOSIS — I63131 Cerebral infarction due to embolism of right carotid artery: Secondary | ICD-10-CM | POA: Diagnosis not present

## 2021-07-20 HISTORY — DX: Peritoneal abscess: K65.1

## 2021-07-20 HISTORY — DX: Cerebral infarction, unspecified: I63.9

## 2021-07-20 HISTORY — DX: Infection following a procedure, organ and space surgical site, initial encounter: T81.43XA

## 2021-07-20 LAB — CBC WITH DIFFERENTIAL/PLATELET
Abs Immature Granulocytes: 0.03 10*3/uL (ref 0.00–0.07)
Basophils Absolute: 0.1 10*3/uL (ref 0.0–0.1)
Basophils Relative: 1 %
Eosinophils Absolute: 0.1 10*3/uL (ref 0.0–0.5)
Eosinophils Relative: 1 %
HCT: 23.3 % — ABNORMAL LOW (ref 36.0–46.0)
Hemoglobin: 7.2 g/dL — ABNORMAL LOW (ref 12.0–15.0)
Immature Granulocytes: 1 %
Lymphocytes Relative: 50 %
Lymphs Abs: 2.9 10*3/uL (ref 0.7–4.0)
MCH: 21.6 pg — ABNORMAL LOW (ref 26.0–34.0)
MCHC: 30.9 g/dL (ref 30.0–36.0)
MCV: 70 fL — ABNORMAL LOW (ref 80.0–100.0)
Monocytes Absolute: 0.4 10*3/uL (ref 0.1–1.0)
Monocytes Relative: 7 %
Neutro Abs: 2.3 10*3/uL (ref 1.7–7.7)
Neutrophils Relative %: 40 %
Platelets: 411 10*3/uL — ABNORMAL HIGH (ref 150–400)
RBC: 3.33 MIL/uL — ABNORMAL LOW (ref 3.87–5.11)
RDW: 16.8 % — ABNORMAL HIGH (ref 11.5–15.5)
WBC: 5.7 10*3/uL (ref 4.0–10.5)
nRBC: 0 % (ref 0.0–0.2)

## 2021-07-20 LAB — RETIC PANEL
Immature Retic Fract: 27.5 % — ABNORMAL HIGH (ref 2.3–15.9)
RBC.: 3.31 MIL/uL — ABNORMAL LOW (ref 3.87–5.11)
Retic Count, Absolute: 98.3 10*3/uL (ref 19.0–186.0)
Retic Ct Pct: 3 % (ref 0.4–3.1)
Reticulocyte Hemoglobin: 24.8 pg — ABNORMAL LOW (ref >27.9)

## 2021-07-20 LAB — COMPREHENSIVE METABOLIC PANEL
ALT: 28 U/L (ref 0–44)
AST: 47 U/L — ABNORMAL HIGH (ref 15–41)
Albumin: 2.1 g/dL — ABNORMAL LOW (ref 3.5–5.0)
Alkaline Phosphatase: 129 U/L — ABNORMAL HIGH (ref 38–126)
Anion gap: 6 (ref 5–15)
BUN: 6 mg/dL (ref 6–20)
CO2: 22 mmol/L (ref 22–32)
Calcium: 8.3 mg/dL — ABNORMAL LOW (ref 8.9–10.3)
Chloride: 112 mmol/L — ABNORMAL HIGH (ref 98–111)
Creatinine, Ser: 0.81 mg/dL (ref 0.44–1.00)
GFR, Estimated: >60 mL/min (ref >60)
Glucose, Bld: 155 mg/dL — ABNORMAL HIGH (ref 70–99)
Potassium: 4.3 mmol/L (ref 3.5–5.1)
Sodium: 140 mmol/L (ref 135–145)
Total Bilirubin: 0.2 mg/dL — ABNORMAL LOW (ref 0.3–1.2)
Total Protein: 6.3 g/dL — ABNORMAL LOW (ref 6.5–8.1)

## 2021-07-20 LAB — GLUCOSE, CAPILLARY
Glucose-Capillary: 159 mg/dL — ABNORMAL HIGH (ref 70–99)
Glucose-Capillary: 171 mg/dL — ABNORMAL HIGH (ref 70–99)
Glucose-Capillary: 150 mg/dL — ABNORMAL HIGH (ref 70–99)
Glucose-Capillary: 185 mg/dL — ABNORMAL HIGH (ref 70–99)

## 2021-07-20 LAB — PROTIME-INR
INR: 1.1 (ref 0.8–1.2)
Prothrombin Time: 13.8 seconds (ref 11.4–15.2)

## 2021-07-20 LAB — IRON AND TIBC
Iron: 64 ug/dL (ref 28–170)
Saturation Ratios: 28 % (ref 10.4–31.8)
TIBC: 228 ug/dL — ABNORMAL LOW (ref 250–450)
UIBC: 164 ug/dL

## 2021-07-20 LAB — FERRITIN: Ferritin: 138 ng/mL (ref 11–307)

## 2021-07-20 MED ORDER — CEFADROXIL 500 MG PO CAPS
500.0000 mg | ORAL_CAPSULE | Freq: Two times a day (BID) | ORAL | Status: DC
  Administered 2021-07-20: 500 mg via ORAL
  Filled 2021-07-20 (×2): qty 1

## 2021-07-20 MED ORDER — SODIUM CHLORIDE 0.9 % IV SOLN
2.0000 g | INTRAVENOUS | Status: DC
  Administered 2021-07-20: 2 g via INTRAVENOUS
  Filled 2021-07-20 (×2): qty 20

## 2021-07-20 MED ORDER — METFORMIN HCL 500 MG PO TABS
500.0000 mg | ORAL_TABLET | Freq: Two times a day (BID) | ORAL | Status: DC
  Administered 2021-07-20 – 2021-07-23 (×6): 500 mg via ORAL
  Filled 2021-07-20 (×6): qty 1

## 2021-07-20 MED ORDER — METRONIDAZOLE 500 MG PO TABS
500.0000 mg | ORAL_TABLET | Freq: Two times a day (BID) | ORAL | Status: DC
Start: 2021-07-20 — End: 2021-07-20

## 2021-07-20 MED ORDER — METFORMIN HCL 500 MG PO TABS: 500.0000 mg | ORAL_TABLET | Freq: Two times a day (BID) | ORAL | Status: DC

## 2021-07-20 MED ORDER — LOSARTAN POTASSIUM 50 MG PO TABS
50.0000 mg | ORAL_TABLET | Freq: Every day | ORAL | Status: DC
  Administered 2021-07-20 – 2021-07-21 (×2): 50 mg via ORAL
  Filled 2021-07-20 (×2): qty 1

## 2021-07-20 MED ORDER — CIPROFLOXACIN HCL 500 MG PO TABS
750.0000 mg | ORAL_TABLET | Freq: Two times a day (BID) | ORAL | Status: DC
  Administered 2021-07-20 – 2021-07-24 (×8): 750 mg via ORAL
  Filled 2021-07-20 (×8): qty 2

## 2021-07-20 MED ORDER — LEVETIRACETAM 500 MG PO TABS
1000.0000 mg | ORAL_TABLET | Freq: Two times a day (BID) | ORAL | Status: DC
Start: 2021-07-20 — End: 2021-07-23
  Administered 2021-07-20 – 2021-07-23 (×6): 1000 mg via ORAL
  Filled 2021-07-20 (×6): qty 2

## 2021-07-20 MED ORDER — HYDROCHLOROTHIAZIDE 12.5 MG PO TABS
12.5000 mg | ORAL_TABLET | Freq: Every day | ORAL | Status: DC
  Administered 2021-07-20 – 2021-07-22 (×3): 12.5 mg via ORAL
  Filled 2021-07-20 (×4): qty 1

## 2021-07-20 MED ORDER — LEVOFLOXACIN 750 MG PO TABS: 750.0000 mg | ORAL_TABLET | Freq: Every day | ORAL | Status: DC

## 2021-07-20 MED ORDER — CARVEDILOL 3.125 MG PO TABS
3.1250 mg | ORAL_TABLET | Freq: Two times a day (BID) | ORAL | Status: DC
  Administered 2021-07-20 – 2021-07-21 (×2): 3.125 mg via ORAL
  Filled 2021-07-20 (×2): qty 1

## 2021-07-20 MED ORDER — EMPAGLIFLOZIN 10 MG PO TABS
10.0000 mg | ORAL_TABLET | Freq: Every day | ORAL | Status: DC
  Administered 2021-07-20 – 2021-07-23 (×4): 10 mg via ORAL
  Filled 2021-07-20 (×5): qty 1

## 2021-07-20 MED ORDER — EMTRICITAB-RILPIVIR-TENOFOV AF 200-25-25 MG PO TABS
1.0000 | ORAL_TABLET | Freq: Every day | ORAL | Status: DC
  Administered 2021-07-20 – 2021-07-25 (×6): 1 via ORAL
  Filled 2021-07-20 (×6): qty 1

## 2021-07-20 MED ORDER — METRONIDAZOLE 500 MG PO TABS
500.0000 mg | ORAL_TABLET | Freq: Two times a day (BID) | ORAL | Status: DC
  Administered 2021-07-20 – 2021-07-27 (×14): 500 mg via ORAL
  Filled 2021-07-20 (×14): qty 1

## 2021-07-20 NOTE — Progress Notes (Signed)
Subjective: No complaints.  Pain improved with drain upsize.  Eating well with no issues.    ROS: See above, otherwise other systems negative  Objective: Vital signs in last 24 hours: Temp:  [98 F (36.7 C)-98.8 F (37.1 C)] 98.5 F (36.9 C) (06/02 0741) Pulse Rate:  [81-109] 105 (06/02 0741) Resp:  [14-20] 16 (06/02 0741) BP: (131-166)/(74-95) 150/89 (06/02 0741) SpO2:  [95 %-100 %] 97 % (06/02 0741) Weight:  [63.5 kg] 63.5 kg (06/01 1617) Last BM Date : 07/19/21  Intake/Output from previous day: 06/01 0701 - 06/02 0700 In: 400 [P.O.:200; IV Piggyback:200] Out: 15 [Drains:15] Intake/Output this shift: No intake/output data recorded.  PE: Abd: soft, not really tender, drain in place with minimal amount of milky output, 15cc documented yesterday.  ND, +BS  Lab Results:  Recent Labs    07/19/21 0359 07/20/21 0347  WBC 6.7 5.7  HGB 8.1* 7.2*  HCT 26.7* 23.3*  PLT 449* 411*   BMET Recent Labs    07/19/21 0359 07/20/21 0347  NA 140 140  K 2.9* 4.3  CL 107 112*  CO2 26 22  GLUCOSE 257* 155*  BUN 6 6  CREATININE 0.81 0.81  CALCIUM 8.6* 8.3*   PT/INR Recent Labs    07/20/21 0347  LABPROT 13.8  INR 1.1   CMP     Component Value Date/Time   NA 140 07/20/2021 0347   K 4.3 07/20/2021 0347   CL 112 (H) 07/20/2021 0347   CO2 22 07/20/2021 0347   GLUCOSE 155 (H) 07/20/2021 0347   BUN 6 07/20/2021 0347   CREATININE 0.81 07/20/2021 0347   CREATININE 1.42 (H) 05/04/2021 0903   CALCIUM 8.3 (L) 07/20/2021 0347   PROT 6.3 (L) 07/20/2021 0347   ALBUMIN 2.1 (L) 07/20/2021 0347   AST 47 (H) 07/20/2021 0347   ALT 28 07/20/2021 0347   ALKPHOS 129 (H) 07/20/2021 0347   BILITOT 0.2 (L) 07/20/2021 0347   GFRNONAA >60 07/20/2021 0347   GFRNONAA 50 (L) 02/25/2020 1117   GFRAA 57 (L) 02/25/2020 1117   Lipase  No results found for: LIPASE     Studies/Results: CT ANGIO HEAD NECK W WO CM  Addendum Date: 07/19/2021   ADDENDUM REPORT: 07/19/2021 07:18  ADDENDUM: Study discussed by telephone with Drs. BHAGAT and Arora on 07/19/2021 at 0712 hours. Electronically Signed   By: Genevie Ann M.D.   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 History of HIV. EXAM: CT ANGIOGRAPHY HEAD AND NECK TECHNIQUE: Multidetector CT imaging of the head and neck was performed using the standard protocol during bolus administration of intravenous contrast. Multiplanar CT image reconstructions and MIPs were obtained to evaluate the vascular anatomy. Carotid stenosis measurements (when applicable) are obtained utilizing NASCET criteria, using the distal internal carotid diameter as the denominator. RADIATION DOSE REDUCTION: This exam was performed according to the departmental dose-optimization program which includes automated exposure control, adjustment of the mA and/or kV according to patient size and/or use of iterative reconstruction technique. CONTRAST:  96mL OMNIPAQUE IOHEXOL 350 MG/ML SOLN COMPARISON:  Brain MRI 0118 hours today.  Head CT yesterday. FINDINGS: Initial imaging attempt at 0446 hours with failed contrast bolus reportedly due to peripheral IV infiltration. The study was repeated with new IV access at 0638 hours. CTA NECK Skeleton: No acute osseous abnormality identified. Upper chest: Mild dependent atelectasis. Partially  visible nonspecific mediastinal lymphadenopathy, increasing toward the level of the carina. Other neck: Negative; no neck mass or cervical lymphadenopathy identified. Aortic arch: Mild Calcified aortic atherosclerosis. 3 vessel arch configuration. Right carotid system: Tortuous right CCA origin without plaque or stenosis. Moderate soft and calcified plaque at the right ICA origin with 60 % stenosis with respect to the distal vessel. Right ICA remains patent to the skull base. Left carotid system: Negative left CCA aside from  mild tortuosity. Moderate soft and calcified plaque at the left ICA origin without stenosis. Vertebral arteries: Negative proximal right subclavian artery. Normal right vertebral artery origin. Some of the right V1 segment is mildly obscured from venous contrast. But the right vertebral artery remains patent to the skull base with no significant stenosis identified. Negative proximal left subclavian artery. Calcified plaque at the left vertebral artery origin but no associated stenosis. Dominant appearing left vertebral artery is patent to the skull base without stenosis. CTA HEAD Posterior circulation: Dominant left V4 segment. No distal vertebral or vertebrobasilar junction plaque or stenosis. Normal PICA origins. Patent basilar artery without stenosis. Patent SCA and PCA origins. Posterior communicating arteries are diminutive or absent. Bilateral PCA branches are within normal limits. Anterior circulation: Patent ICA siphons but ICA tortuosity and calcified plaque bilaterally. On the right side there is a questionable filling defect in the supraclinoid ICA on series 12, image 113 with up to moderate stenosis there. Right ICA terminus, right MCA and ACA origin remain patent. On the left side there is moderate stenosis in the distal cavernous segment with patent left ICA terminus. Mild irregularity at the left MCA and ACA origins but no significant stenosis (series 16 image 20). Diminutive or absent anterior communicating artery. Bilateral ACA branches are within normal limits. Left MCA M1 segment and bifurcation are patent without stenosis. Left MCA branches are within normal limits. Right MCA M1 segment and bifurcation are patent without stenosis. Right MCA branches are within normal limits. Venous sinuses: Patent. Anatomic variants: Dominant left vertebral artery. Review of the MIP images confirms the above findings IMPRESSION: 1. Negative for large vessel occlusion. But positive for questionable filling defect  - nonocclusive thrombus in the supraclinoid Right ICA (series 10, image 58). 2. Underlying age advanced atherosclerosis at both carotid bifurcations and ICA siphons. 60% Right ICA origin stenosis. Moderate stenosis of the Left ICA cavernous segment. 3. Mild irregularity at the left MCA and ACA origins without significant stenosis. No posterior circulation stenosis. 4. 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 Recommend follow-up Chest CT with IV contrast. 5. Aortic Atherosclerosis (ICD10-I70.0). Electronically Signed: By: Genevie Ann M.D. On: 07/19/2021 07:10   CT Head Wo Contrast  Result Date: 07/18/2021 CLINICAL DATA:  Seizure, new onset. No history of trauma. Seizure like activity. EXAM: CT HEAD WITHOUT CONTRAST TECHNIQUE: Contiguous axial images were obtained from the base of the skull through the vertex without intravenous contrast. RADIATION DOSE REDUCTION: This exam was performed according to the departmental dose-optimization program which includes automated exposure control, adjustment of the mA and/or kV according to patient size and/or use of iterative reconstruction technique. COMPARISON:  None Available. FINDINGS: Brain: No evidence of acute infarction, hemorrhage, hydrocephalus, extra-axial collection or mass lesion/mass effect. Vascular: No hyperdense vessel or unexpected calcification. Skull: Normal. Negative for fracture or focal lesion. Sinuses/Orbits: No acute finding. Other: None. IMPRESSION: No CT evidence of acute intracranial abnormality. Electronically Signed   By: Keane Police D.O.   On:  07/18/2021 21:54   MR Brain W and Wo Contrast  Result Date: 07/19/2021 CLINICAL DATA:  Initial evaluation for new onset seizure. EXAM: MRI HEAD WITHOUT AND WITH CONTRAST TECHNIQUE: Multiplanar, multiecho pulse sequences of the brain and surrounding structures were obtained without and with intravenous contrast.  CONTRAST:  41mL GADAVIST GADOBUTROL 1 MMOL/ML IV SOLN COMPARISON:  CT from 07/18/2021. FINDINGS: Brain: Cerebral volume within normal limits. Mild chronic microvascular ischemic disease noted involving the periventricular white matter. Few small acute ischemic infarcts seen involving the subcortical right cerebral hemisphere (series 3, images 38, 35, 27). Largest area of ischemia seen at the right periatrial white matter and measures up to 7 mm. No associated hemorrhage or mass effect. Gray-white matter differentiation otherwise maintained. No areas of chronic cortical infarction or other insult. No visible acute or chronic intracranial blood products. No mass lesion, midline shift or mass effect. No hydrocephalus or extra-axial fluid collection. Pituitary gland suprasellar region within normal limits. Midline structures intact and normally formed. No intrinsic temporal lobe abnormality. No abnormal enhancement. Vascular: Major intracranial vascular flow voids are grossly maintained at the skull base on this motion degraded exam. Skull and upper cervical spine: Craniocervical junction within normal limits. Bone marrow signal intensity normal. No scalp soft tissue abnormality. Sinuses/Orbits: Globes and orbital soft tissues within normal limits. Paranasal sinuses are largely clear. No significant mastoid effusion. Other: None. IMPRESSION: 1. 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 No associated hemorrhage or mass effect. 2. No other acute intracranial abnormality. 3. Underlying mild chronic microvascular ischemic disease. Electronically Signed   By: Jeannine Boga M.D.   On: 07/19/2021 03:01   IR Catheter Tube Change  Result Date: 07/19/2021 INDICATION: Previous active appendectomy, postop abscess, status post percutaneous drain catheter placement. Recent CT shows incomplete evacuation of the complex residual abscess. Drainage vision  requested. EXAM: EXCHANGE OF ABDOMINAL DRAIN CATHETER UNDER FLUOROSCOPY MEDICATIONS: no periprocedural antibiotics were indicated ANESTHESIA/SEDATION: Lidocaine 1% subcutaneous COMPLICATIONS: None immediate. PROCEDURE: Informed written consent was obtained from the patient after a thorough discussion of the procedural risks, benefits and alternatives. All questions were addressed. Maximal Sterile Barrier Technique was utilized including caps, mask, sterile gowns, sterile gloves, sterile drape, hand hygiene and skin antiseptic. A timeout was performed prior to the initiation of the procedure. The previously placed right lower quadrant catheter and surrounding skin were prepped with Betadine, draped in usual sterile fashion. 1% lidocaine was infiltrated subcutaneously around the catheter. Small gentle contrast injection opacifies a small right lower quadrant residual abscess cavity. No fistula to bowel was identified. The catheter was removed over an Amplatz wire and a new 12 French pigtail drain catheter was advanced and formed centrally within the collection. Several mL of thin purulent material spontaneously returned out the catheter. The catheter was secured to the skin with 0 Prolene suture, and placed to gravity drain bag. The patient tolerated the procedure well. IMPRESSION: 1. Technically successful exchange and upsizing of right lower quadrant pigtail drain catheter. Electronically Signed   By: Lucrezia Europe M.D.   On: 07/19/2021 16:21   EEG adult  Result Date: 07/19/2021 Lora Havens, MD     07/19/2021 10:59 AM Patient Name: Ashley Vaughn MRN: [id number] Epilepsy Attending: Lora Havens Referring Physician/Provider: Lorenza Chick, MD Date: 07/19/2021 Duration: 23.30 mins Patient history: 56yo F with new onset seizure. EEG to evaluate for seizure. Level of alertness: Awake, asleep AEDs during EEG study: LEV Technical aspects: This EEG study  was done with scalp electrodes positioned according to the  10-20 International system of electrode placement. Electrical activity was acquired at a sampling rate of 500Hz  and reviewed with a high frequency filter of 70Hz  and a low frequency filter of 1Hz . EEG data were recorded continuously and digitally stored. Description: The posterior dominant rhythm consists of 9-10 Hz activity of moderate voltage (25-35 uV) seen predominantly in posterior head regions, symmetric and reactive to eye opening and eye closing. Sleep was characterized by vertex waves, sleep spindles (12 to 14 Hz), maximal frontocentral region. EEG showed intermittent 3-5Hz  theta-delta slowing in bilateral temporal region. Hyperventilation and photic stimulation were not performed.   ABNORMALITY -Intermittent slow, bilateral temporal region IMPRESSION: This study is suggestive of independent bilateral temporal region, non specific etiology. No seizures or epileptiform discharges were seen throughout the recording. Lora Havens   ECHOCARDIOGRAM COMPLETE  Result Date: 07/19/2021    ECHOCARDIOGRAM REPORT   Patient Name:   DINEEN TROJANOWSKI Mcconaughy Date of Exam: 07/19/2021 Medical Rec #:  [id number]   Height:       62.0 in Accession #:    [id number]  Weight:       141.0 lb Date of Birth:  07-06-1965   BSA:          1.648 m Patient Age:    4 years    BP:           152/90 mmHg Patient Gender: F           HR:           90 bpm. Exam Location:  Inpatient Procedure: 2D Echo, Cardiac Doppler and Color Doppler Indications:    Stroke  History:        Patient has no prior history of Echocardiogram examinations.                 Risk Factors:Hypertension and Diabetes.  Sonographer:    Jefferey Pica Referring Phys: [id number] [place]  1. Left ventricular ejection fraction, by estimation, is <20%. The left ventricle has severely decreased function. The left ventricle has no regional wall motion abnormalities. Left ventricular diastolic parameters are consistent with Grade I diastolic dysfunction (impaired  relaxation).  2. Right ventricular systolic function is normal. The right ventricular size is normal.  3. The mitral valve is normal in structure. Trivial mitral valve regurgitation. No evidence of mitral stenosis.  4. The aortic valve is normal in structure. Aortic valve regurgitation is not visualized. No aortic stenosis is present.  5. The inferior vena cava is normal in size with greater than 50% respiratory variability, suggesting right atrial pressure of 3 mmHg. Comparison(s): No prior Echocardiogram. FINDINGS  Left Ventricle: Left ventricular ejection fraction, by estimation, is <20%. The left ventricle has severely decreased function. The left ventricle has no regional wall motion abnormalities. The left ventricular internal cavity size was normal in size. There is no left ventricular hypertrophy. Left ventricular diastolic parameters are consistent with Grade I diastolic dysfunction (impaired relaxation). Right Ventricle: The right ventricular size is normal. No increase in right ventricular wall thickness. Right ventricular systolic function is normal. Left Atrium: Left atrial size was normal in size. Right Atrium: Right atrial size was normal in size. Pericardium: There is no evidence of pericardial effusion. Mitral Valve: The mitral valve is normal in structure. Trivial mitral valve regurgitation. No evidence of mitral valve stenosis. Tricuspid Valve: The tricuspid valve is normal in structure. Tricuspid valve regurgitation is not demonstrated. No evidence of  tricuspid stenosis. Aortic Valve: The aortic valve is normal in structure. Aortic valve regurgitation is not visualized. No aortic stenosis is present. Aortic valve peak gradient measures 4.0 mmHg. Pulmonic Valve: The pulmonic valve was normal in structure. Pulmonic valve regurgitation is not visualized. No evidence of pulmonic stenosis. Aorta: The aortic root is normal in size and structure. Venous: The inferior vena cava is normal in size with  greater than 50% respiratory variability, suggesting right atrial pressure of 3 mmHg. IAS/Shunts: No atrial level shunt detected by color flow Doppler.  LEFT VENTRICLE PLAX 2D LVIDd:         4.80 cm     Diastology LVIDs:         4.20 cm     LV e' lateral:   7.65 cm/s LV PW:         1.00 cm     LV E/e' lateral: 11.8 LV IVS:        0.90 cm LVOT diam:     2.00 cm LV SV:         53 LV SV Index:   32 LVOT Area:     3.14 cm  LV Volumes (MOD) LV vol d, MOD A4C: 87.2 ml LV vol s, MOD A4C: 68.1 ml LV SV MOD A4C:     87.2 ml RIGHT VENTRICLE             IVC RV Basal diam:  2.50 cm     IVC diam: 2.10 cm RV S prime:     11.90 cm/s TAPSE (M-mode): 2.3 cm LEFT ATRIUM             Index        RIGHT ATRIUM           Index LA diam:        3.40 cm 2.06 cm/m   RA Area:     12.70 cm LA Vol (A2C):   47.7 ml 28.95 ml/m  RA Volume:   27.80 ml  16.87 ml/m LA Vol (A4C):   49.1 ml 29.80 ml/m LA Biplane Vol: 51.2 ml 31.07 ml/m  AORTIC VALVE                 PULMONIC VALVE AV Area (Vmax): 2.59 cm     PV Vmax:       0.65 m/s AV Vmax:        99.70 cm/s   PV Peak grad:  1.7 mmHg AV Peak Grad:   4.0 mmHg LVOT Vmax:      82.20 cm/s LVOT Vmean:     48.100 cm/s LVOT VTI:       0.168 m  AORTA Ao Root diam: 3.20 cm MITRAL VALVE MV Area (PHT): 4.06 cm     SHUNTS MV Decel Time: 187 msec     Systemic VTI:  0.17 m MV E velocity: 90.00 cm/s   Systemic Diam: 2.00 cm MV A velocity: 101.00 cm/s MV E/A ratio:  0.89 Kardie Tobb DO Electronically signed by Berniece Salines DO Signature Date/Time: 07/19/2021/4:33:03 PM    Final    VAS [place] LOWER EXTREMITY VENOUS (DVT)  Result Date: 07/19/2021  Lower Venous DVT Study Patient Name:  TYSHAI BOLAND Carthen  Date of Exam:   07/19/2021 Medical Rec #: [id number]    Accession #:    [id number] Date of Birth: [birth year]    Patient Gender: F Patient Age:   82 years Exam Location:  [facility] Procedure:      VAS [place]  LOWER EXTREMITY VENOUS (DVT) Referring Phys: Cornelius Moras XU  --------------------------------------------------------------------------------  Indications: Stroke.  Comparison Study: No prior study on file Performing Technologist: Sharion Dove RVS  Examination Guidelines: A complete evaluation includes B-mode imaging, spectral Doppler, color Doppler, and power Doppler as needed of all accessible portions of each vessel. Bilateral testing is considered an integral part of a complete examination. Limited examinations for reoccurring indications may be performed as noted. The reflux portion of the exam is performed with the patient in reverse Trendelenburg.  +--------+---------------+---------+-----------+----------------+-------------+ RIGHT   CompressibilityPhasicitySpontaneityProperties      Thrombus                                                                 Aging         +--------+---------------+---------+-----------+----------------+-------------+ CFV     Full                               pulsatile                                                                waveforms                     +--------+---------------+---------+-----------+----------------+-------------+ SFJ     Full                                                             +--------+---------------+---------+-----------+----------------+-------------+ FV Prox Full                                                             +--------+---------------+---------+-----------+----------------+-------------+ FV Mid  Full                                                             +--------+---------------+---------+-----------+----------------+-------------+ FV      Full                                                             Distal                                                                   +--------+---------------+---------+-----------+----------------+-------------+  PFV     Full                                                              +--------+---------------+---------+-----------+----------------+-------------+ POP     Full                               pulsatile                                                                waveforms                     +--------+---------------+---------+-----------+----------------+-------------+ PTV     Full                                                             +--------+---------------+---------+-----------+----------------+-------------+ PERO    Full                                                             +--------+---------------+---------+-----------+----------------+-------------+   +--------+---------------+---------+-----------+----------------+-------------+ LEFT    CompressibilityPhasicitySpontaneityProperties      Thrombus                                                                 Aging         +--------+---------------+---------+-----------+----------------+-------------+ CFV     Full                               pulsatile                                                                waveforms                     +--------+---------------+---------+-----------+----------------+-------------+ SFJ     Full                                                             +--------+---------------+---------+-----------+----------------+-------------+  FV Prox Full                                                             +--------+---------------+---------+-----------+----------------+-------------+ FV Mid  Full                                                             +--------+---------------+---------+-----------+----------------+-------------+ FV      Full                                                             Distal                                                                   +--------+---------------+---------+-----------+----------------+-------------+ PFV      Full                                                             +--------+---------------+---------+-----------+----------------+-------------+ POP     Full                               pulsatile                                                                waveforms                     +--------+---------------+---------+-----------+----------------+-------------+ PTV     Full                                                             +--------+---------------+---------+-----------+----------------+-------------+ PERO    Full                                                             +--------+---------------+---------+-----------+----------------+-------------+     Summary: BILATERAL: - No evidence of deep vein thrombosis  seen in the lower extremities, bilaterally. -No evidence of popliteal cyst, bilaterally. RIGHT: pulsatile waveforms suggestive of fluid overload  LEFT: Pulsatile waveforms suggestive of fluid overload.  *See table(s) above for measurements and observations. Electronically signed by Orlie Pollen on 07/19/2021 at 5:21:05 PM.    Final     Anti-infectives: Anti-infectives (From admission, onward)    Start     Dose/Rate Route Frequency Ordered Stop   07/19/21 2200  metroNIDAZOLE (FLAGYL) IVPB 500 mg        500 mg 100 mL/hr over 60 Minutes Intravenous Every 12 hours 07/19/21 1359     07/19/21 2000  ceFEPIme (MAXIPIME) 2 g in sodium chloride 0.9 % 100 mL IVPB        2 g 200 mL/hr over 30 Minutes Intravenous Every 8 hours 07/19/21 1359     07/19/21 1045  ceFEPIme (MAXIPIME) 2 g in sodium chloride 0.9 % 100 mL IVPB        2 g 200 mL/hr over 30 Minutes Intravenous  Once 07/19/21 1033 07/19/21 1352   07/19/21 1045  metroNIDAZOLE (FLAGYL) IVPB 500 mg        500 mg 100 mL/hr over 60 Minutes Intravenous  Once 07/19/21 1033 07/19/21 1321        Assessment/Plan  Hx of laparoscopic appendectomy on 06/19/21 by Dr. Cheri Fowler at [place] Intra-abdominal  Abscess S/p Perc Drain on 5/17, upsize on 6/1 -patient surgically stable. -follow up with primary surgeon and IR regarding her drain -abx recommendations per ID -no further recommendations -diet as tolerates -we will sign off.  FEN - carb mod VTE - ASA, plavix ID - cefepime, flagyl  I reviewed Consultant IR notes, hospitalist notes, last 24 h vitals and pain scores, last 48 h intake and output, last 24 h labs and trends, and last 24 h imaging results.   LOS: 2 days    Henreitta Cea , [facility] Surgery 07/20/2021, 8:09 AM Please see Amion for pager number during day hours 7:00am-4:30pm or 7:00am -11:30am on weekends

## 2021-07-20 NOTE — Consult Note (Signed)
[facility]    Date of Admission:  07/18/2021     Total days of antibiotics 2               Reason for Consult: HIV disease  Referring Provider: Dr. August Saucer Primary Care Provider: Andrey Campanile, MD   ASSESSMENT:  [name] is a 56 y/o female with generally well controlled HIV disease with recent appendicitis s/p appendectomy complicated by retroceal abscess s/p drain placement admitted with seizure and found to have CVA and incomplete drainage of her abscess. There are no medication interactions with her current medication. Continue current dose of Odefsey. Will check HIV RNA level. In regards to her abscess given incomplete abscess drainage with previous cultures growing proteus mirabilis and streptococcus anginosus recommend total of 2 weeks from new drain placement with cefadroxil and metronidazole. Continue drain management per IR with remaining medical and supportive care per primary team.    PLAN:  Continue current dose of Odefsey for HIV ART.  Check HIV RNA level.  Change antibiotics to cefadroxil 500 mg bid and continue metronidazole for total of 14 days from new drain placement.  Continue follow up with Dr. Ninetta Lights post-discharge.   ID will sign off and be available as needed.    Principal Problem:   Seizure (HCC) Active Problems:   Human immunodeficiency virus (HIV) disease (HCC)   Postprocedural intraabdominal abscess   CVA (cerebral vascular accident) (HCC)    aspirin EC  81 mg Oral Daily   clopidogrel  75 mg Oral Daily   DULoxetine  60 mg Oral Daily   empagliflozin  10 mg Oral Daily   emtricitabine-rilpivir-tenofovir AF  1 tablet Oral Q breakfast   heparin  5,000 Units Subcutaneous Q8H   hydrochlorothiazide  12.5 mg Oral Daily   insulin aspart  0-15 Units Subcutaneous TID WC   insulin aspart  0-5 Units Subcutaneous QHS   insulin glargine-yfgn  15 Units Subcutaneous Daily   losartan  50 mg Oral Daily   melatonin  5 mg Oral  QHS   metFORMIN  500 mg Oral BID WC   potassium chloride  40 mEq Oral BID   rosuvastatin  20 mg Oral Daily   sodium chloride flush  5 mL Intracatheter Q8H     HPI: Ashley Vaughn is a 56 y.o. female with previous medical history of hypertension, Type 2 diabetes, and HIV disease admitted with seizure like activity.  [name] was initially seen on 06/18/21 for appendicitis and underwent appendectomy on 06/19/21. Was seen by Dr. Ninetta Lights in the ID clnic on 5/5 for follow up. No lab work was drawn at the time with most recent viral load undetectable and CD4 count of 1,447 from March 2023. She was continued on her Odefsey. Subsequently admitted to [facility] with abdominal pain, nausea and vomiting and found to have large retroceal abscess at the site of prior appendectomy. Drain placed on 07/04/21 and wound cultures were positive for proteus mirabilis and streptococcus anginosus. Discharged on Augmentin and Ciprofloxacin for 2 weeks. Seen in the ED on 5/30 for syncope related to GI loss versus possible vasovagal source.   [name] is now admitted with seizure like activity described as head turning to the left and sharking of the left arm and rigidity of the right arm. Chest x-ray and CT head with no significant findings. CT abdomen s/p appendectomy and enhancing fluid collection with percutaneous drainage catheter in place and no bowel  obstruction. MRI brain with few scattered subcenter acute ischemic infarcts with no hemorrhage or mass affect. New 12 French pigtail drain catheter placed by IR. With new onset CVA she is started on anticoagulation and ID asked about drug-drug interaction with her HIV medication.    Review of Systems: Review of Systems  Constitutional:  Negative for chills, fever and weight loss.  Respiratory:  Negative for cough, shortness of breath and wheezing.   Cardiovascular:  Negative for chest pain and leg swelling.  Gastrointestinal:  Negative for abdominal pain,  constipation, diarrhea, nausea and vomiting.  Skin:  Negative for rash.    Past Medical History:  Diagnosis Date   Depression    Diabetes mellitus    History of positive PPD, treatment status unknown    HIV infection (HCC)     Social History   Tobacco Use   Smoking status: Never    Passive exposure: Current   Smokeless tobacco: Never  Vaping Use   Vaping Use: Never used  Substance Use Topics   Alcohol use: Yes    Alcohol/week: 0.0 standard drinks    Comment: occasional   Drug use: No    Comment: h/o cocaine abuse, clean since 2009    Family History  Problem Relation Age of Onset   Diabetes Mother    Hypertension Mother    Heart disease Father    Arthritis Father    Diabetes Maternal Uncle     Allergies  Allergen Reactions   Fluorescein Itching and Rash    INTRAVENOUSLY    OBJECTIVE: Blood pressure (!) 149/80, pulse (!) 105, temperature 98.2 F (36.8 C), temperature source Oral, resp. rate 16, height 5\' 2"  (1.575 m), weight 63.5 kg, SpO2 97 %.  Physical Exam Constitutional:      General: She is not in acute distress.    Appearance: She is well-developed.  Cardiovascular:     Rate and Rhythm: Normal rate and regular rhythm.     Heart sounds: Normal heart sounds.  Pulmonary:     Effort: Pulmonary effort is normal.     Breath sounds: Normal breath sounds.  Skin:    General: Skin is warm and dry.  Neurological:     Mental Status: She is alert and oriented to person, place, and time.  Psychiatric:        Mood and Affect: Mood normal.    Lab Results Lab Results  Component Value Date   WBC 5.7 07/20/2021   HGB 7.2 (L) 07/20/2021   HCT 23.3 (L) 07/20/2021   MCV 70.0 (L) 07/20/2021   PLT 411 (H) 07/20/2021    Lab Results  Component Value Date   CREATININE 0.81 07/20/2021   BUN 6 07/20/2021   NA 140 07/20/2021   K 4.3 07/20/2021   CL 112 (H) 07/20/2021   CO2 22 07/20/2021    Lab Results  Component Value Date   ALT 28 07/20/2021   AST 47 (H)  07/20/2021   ALKPHOS 129 (H) 07/20/2021   BILITOT 0.2 (L) 07/20/2021     Microbiology: No results found for this or any previous visit (from the past 240 hour(s)).   09/19/2021, NP [facility]  07/20/2021  12:57 PM

## 2021-07-20 NOTE — Progress Notes (Addendum)
Subjective:  [name] is a 56 y.o. female with PMH significant for HIV, HTN, type 2 DM, and recent appendectomy who was admitted for seizures 2/2 embolic stroke. This morning, patient is feeling sleepy but otherwise feeling well. 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 She has exertional dyspnea as well. She has not noted any swelling of her extremities. She denies PND or orthopnea. No dizziness or focal weakness. She has been tolerating PO intake well without nausea, vomiting, or abdominal pain.   Objective:  Vital signs in last 24 hours: Vitals:   07/20/21 0430 07/20/21 0513 07/20/21 0741 07/20/21 1131  BP: (!) 163/90 (!) 160/95 (!) 150/89 (!) 149/80  Pulse: 96 98 (!) 105 (!) 105  Resp: 16 17 16 16   Temp: 98 F (36.7 C) 98 F (36.7 C) 98.5 F (36.9 C) 98.2 F (36.8 C)  TempSrc: Oral Oral Oral Oral  SpO2: 97%  97% 97%  Weight:      Height:       Weight change:  N/A  Intake/Output Summary (Last 24 hours) at 07/20/2021 1146 Last data filed at 07/20/2021 1024 Gross per 24 hour  Intake 640 ml  Output 15 ml  Net 625 ml   Physical Exam  Constitutional:  Appears tired but alert and oriented x3. In no acute distress.  Cardiovascular:  Regular rate and rhythm. No swelling noted to the lower extremities. Extremities feel warm to touch.  Respiratory:  No increased work of breathing. Lungs are clear to auscultation.  Abdominal:  Abdomen is soft and nontender. No distension. No organomegaly.  Extremities: no asymmetry noted to the extremities.  Skin:  No obvious lesion noted.  Psych:  Normal mood and behavior.  Neuro: Mental Status: Patient is awake, alert, oriented x3 No signs of aphasia or neglect Cranial Nerves: II: Pupils equal, round, and reactive to light.   III,IV, VI: EOMI without ptosis or diploplia.  V: Facial sensation is symmetric to light touch and temperature. VII: Facial  movement is symmetric.  VIII: Hearing is intact  X: Uvula elevates symmetrically XI: Shoulder shrug is symmetric. XII: Tongue is midline without atrophy or fasciculations.  Motor: Normal effort thorughout, at least 5/5 bilateral UE, 5/5 bilateral LE Sensory: Sensation is grossly intact bilateral UE & LE Cerebellar: Finger-Nose and Heel-Shin intact bilaterally      Latest Ref Rng & Units 07/20/2021    3:47 AM 07/19/2021    3:59 AM 07/18/2021    6:31 PM  CBC  WBC 4.0 - 10.5 K/uL 5.7   6.7   7.2    Hemoglobin 12.0 - 15.0 g/dL 7.2   8.1   8.4    Hematocrit 36.0 - 46.0 % 23.3   26.7   28.8    Platelets 150 - 400 K/uL 411   449   504         Latest Ref Rng & Units 07/20/2021    3:47 AM 07/19/2021    3:59 AM 07/18/2021    6:31 PM  CMP  Glucose 70 - 99 mg/dL 155   257   324    BUN 6 - 20 mg/dL 6   6   9     Creatinine 0.44 - 1.00 mg/dL 0.81   0.81   0.89    Sodium 135 - 145 mmol/L 140   140   140    Potassium 3.5 - 5.1 mmol/L 4.3  2.9   3.2    Chloride 98 - 111 mmol/L 112   107   108    CO2 22 - 32 mmol/L 22   26   22     Calcium 8.9 - 10.3 mg/dL 8.3   8.6   8.7    Total Protein 6.5 - 8.1 g/dL 6.3   6.5     Total Bilirubin 0.3 - 1.2 mg/dL 0.2   0.2     Alkaline Phos 38 - 126 U/L 129   116     AST 15 - 41 U/L 47   17     ALT 0 - 44 U/L 28   16       PT/INR: 13.8/1.1  Imaging:   Echocardiogram 07/18/2021 IMPRESSIONS Left ventricular ejection fraction, by estimation, is <20%. The left ventricle has severely decreased function. The left ventricle has no regional wall motion abnormalities. Left ventricular diastolic parameters are consistent with Grade I diastolic dysfunction (impaired relaxation). 1. 2. Right ventricular systolic function is normal. The right ventricular size is normal. The mitral valve is normal in structure. Trivial mitral valve regurgitation. No evidence of mitral stenosis. 3. The aortic valve is normal in structure. Aortic valve regurgitation is not visualized.  No aortic stenosis is present. 4. The inferior vena cava is normal in size with greater than 50% respiratory variability, suggesting right atrial pressure of 3 mmHg.  CTA head and neck 07/18/2021 IMPRESSION: 1. Negative for large vessel occlusion. But positive for questionable filling defect - nonocclusive thrombus in the supraclinoid Right ICA (series 10, image 58).   2. Underlying age advanced atherosclerosis at both carotid bifurcations and ICA siphons. 60% Right ICA origin stenosis. Moderate stenosis of the Left ICA cavernous segment.   3. Mild irregularity at the left MCA and ACA origins without significant stenosis. No posterior circulation stenosis.   4. 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 Recommend follow-up Chest CT with IV contrast.   5. Aortic Atherosclerosis (ICD10-I70.0).  MRI Head 07/18/2021 IMPRESSION: 1. 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 No associated hemorrhage or mass effect. 2. No other acute intracranial abnormality. 3. Underlying mild chronic microvascular ischemic disease.  Assessment/Plan:  Principal Problem:   Seizure [facility])   Patient is a 56 y.o. female with PMH significant for HIV, T2DM, HTN, who was admitted after seizure-like activity and was found to have possible embolic stroke in the right occipital and right MCA/ACA as well as new onset HFrEF <20% noted on echocardiogram during this admission.   Embolic Stroke, right occipital and right MCA/ACA  MRI brain showed scattered subcentimeter acute ischemic infarct involving the subcortical right cerebral hemisphere with no associated hemorrhage or mass effect. CTA showed atherosclerosis at both carotid bifurcation, 60% right ICA origin stenosis and moderate stenosis of left ICA cavernous segment. Nonocclusive thrombus noted  in the supraclinoid right ICA. Echocardiogram showed new cardiomyopathy with EF <20%, no prior echo to compare. On exam today, patient is neurologically intact with no focal weakness. LDL was 90 and patient has been started on Crestor 20 mg daily. A1c elevated at 14.2, on insulin therapy and Jardiance 10 mg daily.  -Neuro following and appreciate recommendation -She has been started on aspirin 81 mg daily and Plavix 75 mg daily. With concern for embolic stroke, will consult cardiology for further work up of cardiomyopathy and possible need for TEE.  Would likely start patient on eliquis.  -  Continue Crestor 20 mg daily -Continue insulin and Jardiance -Will start on metformin 500 mg BID for better diabetes control -Continue to monitor  New onset HFrEF <20% Patient noted to have new HFrEF <20%. No prior echocardiogram to compare. No evidence of volume overload however patient did note some exertional dyspnea as well as fatigue since December. She has been on losartan 50 mg daily for blood control as well as Jardiance 10 mg daily for diabetes.  -Will consult cardiology for additional medications for GDMT.  -Continue to monitor.   Seizure-like activity No recurrent episodes of seizures. EEG showed independent bilateral temporal slowing with no seizure. Currently on Keppra.  -Continue IV Keppra 1000 mg BID. Plan to transition to oral Keppra once stable. Neuro following.  -Continue Ativan 1 mg q6h PRN for breakthrough seizures -Continue to monitor  HIV Patient has history of well-controlled HIV. She is on Odefsey at home and has been compliant with her medication. Undetectable viral load noted on 05/04/2021.  -Re-started on Odefsey 200-25-25 mg daily.  -Consult ID for appropriate management of HIV while admitted.   Anemia of chronic disease Reactive thrombocytosis No history of hematochezia, hematuria, hematemesis, or hemoptysis. No known history of anemia. Hemoglobin was 7.2, down from 8.1  yesterday. thrombocytosis at 411, likely reactive 2/2 anemia.  -Order iron studies  -Continue to monitor  Abdominal abscess s/p appendectomy  Patient has history of recent appendectomy on 05/02 and was noted to have intra-abdominal abscess on CT. Culture grew proteus mirabilis and Streptococcus anginosus. She is s/p percutaneous drain placement on 05/17 with replacement on 06/01. She was initially on Augmentin and ciprofloxacin and has now been transitioned to flagyl and cefepime. She remains afebrile however is mildly tachycardic in the 100s.  -Will stop cefepime and transition patient to rocephin -Continue Flagyl -Appreciate further recommendation from ID  -Continue to monitor  Type 2 diabetes mellitus Patient on Lantus 30 units Synjardy at home. A1c was 14.2 here. Currently on novolog and Semglee here.  -Will start patient on metformin 500 mg BID for better diabetes control -Continue Jardiance 10 mg daily -Continue Novolog and Semglee -Continue to monitor.   Hypertension Permissive hypertension with recent acute stroke. SBP has been in the 150-160s. Currently on HCTZ and losartan.  -Continue HCTZ 12.5 mg daily -Continue losartan 50 mg daily -Continue to monitor   LOS: 2 days   Clydell Hakim, Medical Student 07/20/2021, 11:46 AM  Pager number: [phone]   Attestation for Student Documentation:  I personally was present and performed or re-performed the history, physical exam and medical decision-making activities of this service and have verified that the service and findings are accurately documented in the student's note.  Farrel Gordon, DO 07/20/2021, 11:48 AM

## 2021-07-20 NOTE — Consult Note (Addendum)
Cardiology Consultation:   Patient ID: Ashley Vaughn MRN: [id number]; DOB: February 22, 1965  Admit date: 07/18/2021 Date of Consult: 07/20/2021  PCP:  Orvis Brill, MD   [place] Providers Cardiologist:  New   Patient Profile:   Ashley Vaughn is a 56 y.o. female with a hx of  poorly controlled diabetes mellitus, hypertension, HIV, HSV, anemia, and recent lap appendectomy 5/5 complicated by intra-abdominal abscess requiring percutaneous drain (5/17-525) who is being seen 07/20/2021 for the evaluation of Low EF and anticoagulation recommendation at the request of [place].   History of Present Illness:   Ms. [place] presented 5/31 with witnessed seizure activity at home and somnolence with brief ? syncope  Also had another event in ER while EDP in room.  She was given Ativan and Keppra. MRI brain showed scattered subcentimeter acute ischemic infarct involving the subcortical right cerebral hemisphere with no associated hemorrhage or mass effect. CTA showed atherosclerosis at both carotid bifurcation, 60% right ICA origin stenosis and moderate stenosis of left ICA cavernous segment.  Patient is being followed by neurology.  She is currently on aspirin and Plavix. Cardiology is consulted for recommendation on anticoagulation for reduced LV function at less than 20%.  No LV thrombus found on transthoracic echocardiogram.  No prior known EF.  No known arrhythmia.  Patient denies chest pain, shortness of breath, orthopnea, PND, or melena.  Reported intermittent chronic lower extremity edema.  Denies use of illicit drug or alcohol.  Prior history of cocaine abuse in teens.  No family history of CAD per patient's  knowledge.  Patient was seen by IR and underwent upsize right lower quadrant drain 6/1.  She is followed by ID and surgery for abscess.  Echo 07/19/2021 1. Left ventricular ejection fraction, by estimation, is <20%. The left  ventricle has severely decreased function. The left ventricle has no   regional wall motion abnormalities. Left ventricular diastolic parameters  are consistent with Grade I diastolic  dysfunction (impaired relaxation).   2. Right ventricular systolic function is normal. The right ventricular  size is normal.   3. The mitral valve is normal in structure. Trivial mitral valve  regurgitation. No evidence of mitral stenosis.   4. The aortic valve is normal in structure. Aortic valve regurgitation is  not visualized. No aortic stenosis is present.   5. The inferior vena cava is normal in size with greater than 50%  respiratory variability, suggesting right atrial pressure of 3 mmHg.   Comparison(s): No prior Echocardiogram.   Past Medical History:  Diagnosis Date   Depression    Diabetes mellitus    History of positive PPD, treatment status unknown    HIV infection ([place])     Past Surgical History:  Procedure Laterality Date   ABDOMINAL HYSTERECTOMY     IR CATHETER TUBE CHANGE  07/19/2021      Inpatient Medications: Scheduled Meds:  aspirin EC  81 mg Oral Daily   cefadroxil  500 mg Oral BID   clopidogrel  75 mg Oral Daily   DULoxetine  60 mg Oral Daily   empagliflozin  10 mg Oral Daily   emtricitabine-rilpivir-tenofovir AF  1 tablet Oral Q breakfast   heparin  5,000 Units Subcutaneous Q8H   hydrochlorothiazide  12.5 mg Oral Daily   insulin aspart  0-15 Units Subcutaneous TID WC   insulin aspart  0-5 Units Subcutaneous QHS   insulin glargine-yfgn  15 Units Subcutaneous Daily   losartan  50 mg Oral Daily   melatonin  5 mg Oral QHS   metFORMIN  500 mg Oral BID WC   metroNIDAZOLE  500 mg Oral Q12H   potassium chloride  40 mEq Oral BID   rosuvastatin  20 mg Oral Daily   sodium chloride flush  5 mL Intracatheter Q8H   Continuous Infusions:  levETIRAcetam 1,000 mg (07/20/21 1015)   PRN Meds: acetaminophen, LORazepam, polyethylene glycol  Allergies:    Allergies  Allergen Reactions   Fluorescein Itching and Rash    INTRAVENOUSLY     Social History:   Social History   Socioeconomic History   Marital status: Legally Separated    Spouse name: Not on file   Number of children: Not on file   Years of education: Not on file   Highest education level: Not on file  Occupational History   Not on file  Tobacco Use   Smoking status: Never    Passive exposure: Current   Smokeless tobacco: Never  Vaping Use   Vaping Use: Never used  Substance and Sexual Activity   Alcohol use: Yes    Alcohol/week: 0.0 standard drinks    Comment: occasional   Drug use: No    Comment: h/o cocaine abuse, clean since 2009   Sexual activity: Never    Partners: Male    Comment: pt. declined condoms  Other Topics Concern   Not on file  Social History Narrative   Not on file   Social Determinants of Health   Financial Resource Strain: High Risk   Difficulty of Paying Living Expenses: Very hard  Food Insecurity: Landscape architect Present   Worried About Charity fundraiser in the Last Year: Often true   Arboriculturist in the Last Year: Often true  Transportation Needs: Not on file  Physical Activity: Not on file  Stress: Not on file  Social Connections: Not on file  Intimate Partner Violence: Not on file    Family History:    Family History  Problem Relation Age of Onset   Diabetes Mother    Hypertension Mother    Heart disease Father    Arthritis Father    Diabetes Maternal Uncle      ROS:  Please see the history of present illness.  All other ROS reviewed and negative.     Physical Exam/Data:   Vitals:   07/20/21 0430 07/20/21 0513 07/20/21 0741 07/20/21 1131  BP: (!) 163/90 (!) 160/95 (!) 150/89 (!) 149/80  Pulse: 96 98 (!) 105 (!) 105  Resp: 16 17 16 16   Temp: 98 F (36.7 C) 98 F (36.7 C) 98.5 F (36.9 C) 98.2 F (36.8 C)  TempSrc: Oral Oral Oral Oral  SpO2: 97%  97% 97%  Weight:      Height:        Intake/Output Summary (Last 24 hours) at 07/20/2021 1416 Last data filed at 07/20/2021 1024 Gross per  24 hour  Intake 440 ml  Output 15 ml  Net 425 ml      07/19/2021    4:17 PM 07/18/2021    8:30 AM 05/09/2021    2:47 PM  Last 3 Weights  Weight (lbs) 140 lb 141 lb 143 lb 9.6 oz  Weight (kg) 63.504 kg 63.957 kg 65.137 kg     Body mass index is 25.61 kg/m.  General:  Well nourished, well developed, in no acute distress HEENT: normal Neck: no JVD Vascular: No carotid bruits; Distal pulses 2+ bilaterally Cardiac:  normal S1, S2; RRR; no murmur  Lungs:  clear to auscultation bilaterally, no wheezing, rhonchi or rales  Abd: Drain in place  Ext: no edema Musculoskeletal:  No deformities, BUE and BLE strength normal and equal Skin: warm and dry  Neuro:  CNs 2-12 intact, no focal abnormalities noted Psych:  Normal affect   EKG:  The EKG was personally reviewed and demonstrates: [place] Telemetry:  Telemetry was personally reviewed and demonstrates: Sinus tachycardia  Relevant CV Studies:  As summarized above  Laboratory Data:  High Sensitivity Troponin:   Recent Labs  Lab 07/18/21 0010 07/18/21 0317  TROPONINIHS 10 16     Chemistry Recent Labs  Lab 07/18/21 1831 07/19/21 0359 07/20/21 0347  NA 140 140 140  K 3.2* 2.9* 4.3  CL 108 107 112*  CO2 22 26 22   GLUCOSE 324* 257* 155*  BUN 9 6 6   CREATININE 0.89 0.81 0.81  CALCIUM 8.7* 8.6* 8.3*  MG 1.9 1.9  --   GFRNONAA >60 >60 >60  ANIONGAP 10 7 6     Recent Labs  Lab 07/18/21 0010 07/19/21 0359 07/20/21 0347  PROT 6.7 6.5 6.3*  ALBUMIN 2.4* 2.3* 2.1*  AST 30 17 47*  ALT 16 16 28   ALKPHOS 134* 116 129*  BILITOT 0.6 0.2* 0.2*   Lipids  Recent Labs  Lab 07/19/21 0359  CHOL 148  TRIG 118  HDL 34*  LDLCALC 90  CHOLHDL 4.4    Hematology Recent Labs  Lab 07/18/21 1831 07/19/21 0359 07/20/21 0347  WBC 7.2 6.7 5.7  RBC 3.99 3.77* 3.33*  3.31*  HGB 8.4* 8.1* 7.2*  HCT 28.8* 26.7* 23.3*  MCV 72.2* 70.8* 70.0*  MCH 21.1* 21.5* 21.6*  MCHC 29.2* 30.3 30.9  RDW 16.8* 16.8* 16.8*  PLT 504*  449* 411*   Radiology/Studies:  CT ANGIO HEAD NECK W WO CM  Addendum Date: 07/19/2021   ADDENDUM REPORT: 07/19/2021 07:18 ADDENDUM: Study discussed by telephone with Drs. BHAGAT and Arora on 07/19/2021 at 0712 hours. Electronically Signed   By: Genevie Ann M.D.   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 History of HIV. EXAM: CT ANGIOGRAPHY HEAD AND NECK TECHNIQUE: Multidetector CT imaging of the head and neck was performed using the standard protocol during bolus administration of intravenous contrast. Multiplanar CT image reconstructions and MIPs were obtained to evaluate the vascular anatomy. Carotid stenosis measurements (when applicable) are obtained utilizing NASCET criteria, using the distal internal carotid diameter as the denominator. RADIATION DOSE REDUCTION: This exam was performed according to the departmental dose-optimization program which includes automated exposure control, adjustment of the mA and/or kV according to patient size and/or use of iterative reconstruction technique. CONTRAST:  59mL OMNIPAQUE IOHEXOL 350 MG/ML SOLN COMPARISON:  Brain MRI 0118 hours today.  Head CT yesterday. FINDINGS: Initial imaging attempt at 0446 hours with failed contrast bolus reportedly due to peripheral IV infiltration. The study was repeated with new IV access at 0638 hours. CTA NECK Skeleton: No acute osseous abnormality identified. Upper chest: Mild dependent atelectasis. Partially visible nonspecific mediastinal lymphadenopathy, increasing toward the level of the carina. Other neck: Negative; no neck mass or cervical lymphadenopathy identified. Aortic arch: Mild Calcified aortic atherosclerosis. 3 vessel arch configuration. Right carotid system: Tortuous right CCA origin without plaque or stenosis. Moderate soft and calcified plaque at the right ICA origin with 60 % stenosis with  respect to the distal vessel. Right ICA remains patent  to the skull base. Left carotid system: Negative left CCA aside from mild tortuosity. Moderate soft and calcified plaque at the left ICA origin without stenosis. Vertebral arteries: Negative proximal right subclavian artery. Normal right vertebral artery origin. Some of the right V1 segment is mildly obscured from venous contrast. But the right vertebral artery remains patent to the skull base with no significant stenosis identified. Negative proximal left subclavian artery. Calcified plaque at the left vertebral artery origin but no associated stenosis. Dominant appearing left vertebral artery is patent to the skull base without stenosis. CTA HEAD Posterior circulation: Dominant left V4 segment. No distal vertebral or vertebrobasilar junction plaque or stenosis. Normal PICA origins. Patent basilar artery without stenosis. Patent SCA and PCA origins. Posterior communicating arteries are diminutive or absent. Bilateral PCA branches are within normal limits. Anterior circulation: Patent ICA siphons but ICA tortuosity and calcified plaque bilaterally. On the right side there is a questionable filling defect in the supraclinoid ICA on series 12, image 113 with up to moderate stenosis there. Right ICA terminus, right MCA and ACA origin remain patent. On the left side there is moderate stenosis in the distal cavernous segment with patent left ICA terminus. Mild irregularity at the left MCA and ACA origins but no significant stenosis (series 16 image 20). Diminutive or absent anterior communicating artery. Bilateral ACA branches are within normal limits. Left MCA M1 segment and bifurcation are patent without stenosis. Left MCA branches are within normal limits. Right MCA M1 segment and bifurcation are patent without stenosis. Right MCA branches are within normal limits. Venous sinuses: Patent. Anatomic variants: Dominant left vertebral artery. Review of the MIP images  confirms the above findings IMPRESSION: 1. Negative for large vessel occlusion. But positive for questionable filling defect - nonocclusive thrombus in the supraclinoid Right ICA (series 10, image 58). 2. Underlying age advanced atherosclerosis at both carotid bifurcations and ICA siphons. 60% Right ICA origin stenosis. Moderate stenosis of the Left ICA cavernous segment. 3. Mild irregularity at the left MCA and ACA origins without significant stenosis. No posterior circulation stenosis. 4. 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 Recommend follow-up Chest CT with IV contrast. 5. Aortic Atherosclerosis (ICD10-I70.0). Electronically Signed: By: Genevie Ann M.D. On: 07/19/2021 07:10   CT Head Wo Contrast  Result Date: 07/18/2021 CLINICAL DATA:  Seizure, new onset. No history of trauma. Seizure like activity. EXAM: CT HEAD WITHOUT CONTRAST TECHNIQUE: Contiguous axial images were obtained from the base of the skull through the vertex without intravenous contrast. RADIATION DOSE REDUCTION: This exam was performed according to the departmental dose-optimization program which includes automated exposure control, adjustment of the mA and/or kV according to patient size and/or use of iterative reconstruction technique. COMPARISON:  None Available. FINDINGS: Brain: No evidence of acute infarction, hemorrhage, hydrocephalus, extra-axial collection or mass lesion/mass effect. Vascular: No hyperdense vessel or unexpected calcification. Skull: Normal. Negative for fracture or focal lesion. Sinuses/Orbits: No acute finding. Other: None. IMPRESSION: No CT evidence of acute intracranial abnormality. Electronically Signed   By: Keane Police D.O.   On: 07/18/2021 21:54   MR Brain W and Wo Contrast  Result Date: 07/19/2021 CLINICAL DATA:  Initial evaluation for new onset seizure. EXAM: MRI HEAD WITHOUT AND WITH CONTRAST TECHNIQUE:  Multiplanar, multiecho pulse sequences of the brain and surrounding structures were obtained without and with intravenous contrast. CONTRAST:  19mL GADAVIST GADOBUTROL 1 MMOL/ML IV SOLN COMPARISON:  CT from 07/18/2021. FINDINGS: Brain: Cerebral volume  within normal limits. Mild chronic microvascular ischemic disease noted involving the periventricular white matter. Few small acute ischemic infarcts seen involving the subcortical right cerebral hemisphere (series 3, images 38, 35, 27). Largest area of ischemia seen at the right periatrial white matter and measures up to 7 mm. No associated hemorrhage or mass effect. Gray-white matter differentiation otherwise maintained. No areas of chronic cortical infarction or other insult. No visible acute or chronic intracranial blood products. No mass lesion, midline shift or mass effect. No hydrocephalus or extra-axial fluid collection. Pituitary gland suprasellar region within normal limits. Midline structures intact and normally formed. No intrinsic temporal lobe abnormality. No abnormal enhancement. Vascular: Major intracranial vascular flow voids are grossly maintained at the skull base on this motion degraded exam. Skull and upper cervical spine: Craniocervical junction within normal limits. Bone marrow signal intensity normal. No scalp soft tissue abnormality. Sinuses/Orbits: Globes and orbital soft tissues within normal limits. Paranasal sinuses are largely clear. No significant mastoid effusion. Other: None. IMPRESSION: 1. 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 No associated hemorrhage or mass effect. 2. No other acute intracranial abnormality. 3. Underlying mild chronic microvascular ischemic disease. Electronically Signed   By: Jeannine Boga M.D.   On: 07/19/2021 03:01   CT ABDOMEN PELVIS W CONTRAST  Result Date: 07/18/2021 CLINICAL DATA:  Appendix removed on 06/22/2021. Evaluate for  bowel obstruction. Abdominal pain. EXAM: CT ABDOMEN AND PELVIS WITH CONTRAST TECHNIQUE: Multidetector CT imaging of the abdomen and pelvis was performed using the standard protocol following bolus administration of intravenous contrast. RADIATION DOSE REDUCTION: This exam was performed according to the departmental dose-optimization program which includes automated exposure control, adjustment of the mA and/or kV according to patient size and/or use of iterative reconstruction technique. CONTRAST:  171mL OMNIPAQUE IOHEXOL 300 MG/ML  SOLN COMPARISON:  None Available. FINDINGS: Lower chest: No acute abnormality. Hepatobiliary: No focal liver abnormality is seen. No gallstones, gallbladder wall thickening, or biliary dilatation. Pancreas: Unremarkable. No pancreatic ductal dilatation or surrounding inflammatory changes. Spleen: Normal in size without focal abnormality. Adrenals/Urinary Tract: There is a rounded hypodensity in the left kidney which is too small to characterize, likely a cyst. Otherwise, the kidneys, adrenal glands, and bladder are within normal limits. Stomach/Bowel: Patient is status post appendectomy. There is an enhancing fluid collection containing air in the right paracolic gutter extending into the posterior right abdominal intramuscular wall. This collection measures 4.3 x 5.4 x 6.1 cm. Percutaneous drainage catheter is seen coiled in this collection. There is no bowel obstruction or free air. There is a large amount of stool throughout the colon. Stomach is within normal limits. Vascular/Lymphatic: No significant vascular findings are present. No enlarged abdominal or pelvic lymph nodes. Reproductive: Status post hysterectomy. No adnexal masses. Other: No free fluid or focal abdominal wall hernia. There is subcutaneous stranding at the level of the percutaneous catheter and mild edema of the intramuscular wall at this level as well. Musculoskeletal: No acute or significant osseous findings.  IMPRESSION: 1. Patient is status post appendectomy. Enhancing fluid collection containing air in the right paracolic gutter extending into the adjacent intramuscular wall consistent with abscess. Percutaneous drainage catheter in place within this collection. 2. No evidence for bowel obstruction. Electronically Signed   By: Ronney Asters M.D.   On: 07/18/2021 03:52   IR Catheter Tube Change  Result Date: 07/19/2021 INDICATION: Previous active appendectomy, postop abscess, status post percutaneous drain catheter placement. Recent CT shows incomplete evacuation of the  complex residual abscess. Drainage vision requested. EXAM: EXCHANGE OF ABDOMINAL DRAIN CATHETER UNDER FLUOROSCOPY MEDICATIONS: no periprocedural antibiotics were indicated ANESTHESIA/SEDATION: Lidocaine 1% subcutaneous COMPLICATIONS: None immediate. PROCEDURE: Informed written consent was obtained from the patient after a thorough discussion of the procedural risks, benefits and alternatives. All questions were addressed. Maximal Sterile Barrier Technique was utilized including caps, mask, sterile gowns, sterile gloves, sterile drape, hand hygiene and skin antiseptic. A timeout was performed prior to the initiation of the procedure. The previously placed right lower quadrant catheter and surrounding skin were prepped with Betadine, draped in usual sterile fashion. 1% lidocaine was infiltrated subcutaneously around the catheter. Small gentle contrast injection opacifies a small right lower quadrant residual abscess cavity. No fistula to bowel was identified. The catheter was removed over an Amplatz wire and a new 12 French pigtail drain catheter was advanced and formed centrally within the collection. Several mL of thin purulent material spontaneously returned out the catheter. The catheter was secured to the skin with 0 Prolene suture, and placed to gravity drain bag. The patient tolerated the procedure well. IMPRESSION: 1. Technically successful  exchange and upsizing of right lower quadrant pigtail drain catheter. Electronically Signed   By: Lucrezia Europe M.D.   On: 07/19/2021 16:21   DG Chest Port 1 View  Result Date: 07/18/2021 CLINICAL DATA:  Syncope. EXAM: PORTABLE CHEST 1 VIEW COMPARISON:  Chest x-ray 01/30/2004 FINDINGS: The heart size and mediastinal contours are within normal limits. Both lungs are clear. The visualized skeletal structures are unremarkable. IMPRESSION: No active disease. Electronically Signed   By: Ronney Asters M.D.   On: 07/18/2021 01:32   EEG adult  Result Date: 07/19/2021 Lora Havens, MD     07/19/2021 10:59 AM Patient Name: KIELYN DUDZIAK MRN: [id number] Epilepsy Attending: Lora Havens Referring Physician/Provider: Lorenza Chick, MD Date: 07/19/2021 Duration: 23.30 mins Patient history: 56yo F with new onset seizure. EEG to evaluate for seizure. Level of alertness: Awake, asleep AEDs during EEG study: LEV Technical aspects: This EEG study was done with scalp electrodes positioned according to the 10-20 International system of electrode placement. Electrical activity was acquired at a sampling rate of 500Hz  and reviewed with a high frequency filter of 70Hz  and a low frequency filter of 1Hz . EEG data were recorded continuously and digitally stored. Description: The posterior dominant rhythm consists of 9-10 Hz activity of moderate voltage (25-35 uV) seen predominantly in posterior head regions, symmetric and reactive to eye opening and eye closing. Sleep was characterized by vertex waves, sleep spindles (12 to 14 Hz), maximal frontocentral region. EEG showed intermittent 3-5Hz  theta-delta slowing in bilateral temporal region. Hyperventilation and photic stimulation were not performed.   ABNORMALITY -Intermittent slow, bilateral temporal region IMPRESSION: This study is suggestive of independent bilateral temporal region, non specific etiology. No seizures or epileptiform discharges were seen throughout the recording.  Lora Havens   ECHOCARDIOGRAM COMPLETE  Result Date: 07/19/2021    ECHOCARDIOGRAM REPORT   Patient Name:   Ashley Vaughn Date of Exam: 07/19/2021 Medical Rec #:  [id number]   Height:       62.0 in Accession #:    [id number]  Weight:       141.0 lb Date of Birth:  01/30/1966   BSA:          1.648 m Patient Age:    52 years    BP:           152/90 mmHg Patient Gender: F  HR:           90 bpm. Exam Location:  Inpatient Procedure: 2D Echo, Cardiac Doppler and Color Doppler Indications:    Stroke  History:        Patient has no prior history of Echocardiogram examinations.                 Risk Factors:Hypertension and Diabetes.  Sonographer:    Jefferey Pica Referring Phys: [id number] [place]  1. Left ventricular ejection fraction, by estimation, is <20%. The left ventricle has severely decreased function. The left ventricle has no regional wall motion abnormalities. Left ventricular diastolic parameters are consistent with Grade I diastolic dysfunction (impaired relaxation).  2. Right ventricular systolic function is normal. The right ventricular size is normal.  3. The mitral valve is normal in structure. Trivial mitral valve regurgitation. No evidence of mitral stenosis.  4. The aortic valve is normal in structure. Aortic valve regurgitation is not visualized. No aortic stenosis is present.  5. The inferior vena cava is normal in size with greater than 50% respiratory variability, suggesting right atrial pressure of 3 mmHg. Comparison(s): No prior Echocardiogram. FINDINGS  Left Ventricle: Left ventricular ejection fraction, by estimation, is <20%. The left ventricle has severely decreased function. The left ventricle has no regional wall motion abnormalities. The left ventricular internal cavity size was normal in size. There is no left ventricular hypertrophy. Left ventricular diastolic parameters are consistent with Grade I diastolic dysfunction (impaired relaxation). Right Ventricle:  The right ventricular size is normal. No increase in right ventricular wall thickness. Right ventricular systolic function is normal. Left Atrium: Left atrial size was normal in size. Right Atrium: Right atrial size was normal in size. Pericardium: There is no evidence of pericardial effusion. Mitral Valve: The mitral valve is normal in structure. Trivial mitral valve regurgitation. No evidence of mitral valve stenosis. Tricuspid Valve: The tricuspid valve is normal in structure. Tricuspid valve regurgitation is not demonstrated. No evidence of tricuspid stenosis. Aortic Valve: The aortic valve is normal in structure. Aortic valve regurgitation is not visualized. No aortic stenosis is present. Aortic valve peak gradient measures 4.0 mmHg. Pulmonic Valve: The pulmonic valve was normal in structure. Pulmonic valve regurgitation is not visualized. No evidence of pulmonic stenosis. Aorta: The aortic root is normal in size and structure. Venous: The inferior vena cava is normal in size with greater than 50% respiratory variability, suggesting right atrial pressure of 3 mmHg. IAS/Shunts: No atrial level shunt detected by color flow Doppler.  LEFT VENTRICLE PLAX 2D LVIDd:         4.80 cm     Diastology LVIDs:         4.20 cm     LV e' lateral:   7.65 cm/s LV PW:         1.00 cm     LV E/e' lateral: 11.8 LV IVS:        0.90 cm LVOT diam:     2.00 cm LV SV:         53 LV SV Index:   32 LVOT Area:     3.14 cm  LV Volumes (MOD) LV vol d, MOD A4C: 87.2 ml LV vol s, MOD A4C: 68.1 ml LV SV MOD A4C:     87.2 ml RIGHT VENTRICLE             IVC RV Basal diam:  2.50 cm     IVC diam: 2.10 cm RV S prime:  11.90 cm/s TAPSE (M-mode): 2.3 cm LEFT ATRIUM             Index        RIGHT ATRIUM           Index LA diam:        3.40 cm 2.06 cm/m   RA Area:     12.70 cm LA Vol (A2C):   47.7 ml 28.95 ml/m  RA Volume:   27.80 ml  16.87 ml/m LA Vol (A4C):   49.1 ml 29.80 ml/m LA Biplane Vol: 51.2 ml 31.07 ml/m  AORTIC VALVE                  PULMONIC VALVE AV Area (Vmax): 2.59 cm     PV Vmax:       0.65 m/s AV Vmax:        99.70 cm/s   PV Peak grad:  1.7 mmHg AV Peak Grad:   4.0 mmHg LVOT Vmax:      82.20 cm/s LVOT Vmean:     48.100 cm/s LVOT VTI:       0.168 m  AORTA Ao Root diam: 3.20 cm MITRAL VALVE MV Area (PHT): 4.06 cm     SHUNTS MV Decel Time: 187 msec     Systemic VTI:  0.17 m MV E velocity: 90.00 cm/s   Systemic Diam: 2.00 cm MV A velocity: 101.00 cm/s MV E/A ratio:  0.89 Kardie Tobb DO Electronically signed by Berniece Salines DO Signature Date/Time: 07/19/2021/4:33:03 PM    Final    VAS [place] LOWER EXTREMITY VENOUS (DVT)  Result Date: 07/19/2021  Lower Venous DVT Study Patient Name:  Ashley Vaughn  Date of Exam:   07/19/2021 Medical Rec #: [id number]    Accession #:    [id number] Date of Birth: 1965/12/03    Patient Gender: F Patient Age:   24 years Exam Location:  [facility] Procedure:      VAS [place] LOWER EXTREMITY VENOUS (DVT) Referring Phys: Cornelius Moras XU --------------------------------------------------------------------------------  Indications: Stroke.  Comparison Study: No prior study on file Performing Technologist: Sharion Dove RVS  Examination Guidelines: A complete evaluation includes B-mode imaging, spectral Doppler, color Doppler, and power Doppler as needed of all accessible portions of each vessel. Bilateral testing is considered an integral part of a complete examination. Limited examinations for reoccurring indications may be performed as noted. The reflux portion of the exam is performed with the patient in reverse Trendelenburg.  +--------+---------------+---------+-----------+----------------+-------------+ RIGHT   CompressibilityPhasicitySpontaneityProperties      Thrombus                                                                 Aging         +--------+---------------+---------+-----------+----------------+-------------+ CFV     Full                               pulsatile  waveforms                     +--------+---------------+---------+-----------+----------------+-------------+ SFJ     Full                                                             +--------+---------------+---------+-----------+----------------+-------------+ FV Prox Full                                                             +--------+---------------+---------+-----------+----------------+-------------+ FV Mid  Full                                                             +--------+---------------+---------+-----------+----------------+-------------+ FV      Full                                                             Distal                                                                   +--------+---------------+---------+-----------+----------------+-------------+ PFV     Full                                                             +--------+---------------+---------+-----------+----------------+-------------+ POP     Full                               pulsatile                                                                waveforms                     +--------+---------------+---------+-----------+----------------+-------------+ PTV     Full                                                             +--------+---------------+---------+-----------+----------------+-------------+  PERO    Full                                                             +--------+---------------+---------+-----------+----------------+-------------+   +--------+---------------+---------+-----------+----------------+-------------+ LEFT    CompressibilityPhasicitySpontaneityProperties      Thrombus                                                                 Aging         +--------+---------------+---------+-----------+----------------+-------------+ CFV     Full                                pulsatile                                                                waveforms                     +--------+---------------+---------+-----------+----------------+-------------+ SFJ     Full                                                             +--------+---------------+---------+-----------+----------------+-------------+ FV Prox Full                                                             +--------+---------------+---------+-----------+----------------+-------------+ FV Mid  Full                                                             +--------+---------------+---------+-----------+----------------+-------------+ FV      Full                                                             Distal                                                                   +--------+---------------+---------+-----------+----------------+-------------+  PFV     Full                                                             +--------+---------------+---------+-----------+----------------+-------------+ POP     Full                               pulsatile                                                                waveforms                     +--------+---------------+---------+-----------+----------------+-------------+ PTV     Full                                                             +--------+---------------+---------+-----------+----------------+-------------+ PERO    Full                                                             +--------+---------------+---------+-----------+----------------+-------------+     Summary: BILATERAL: - No evidence of deep vein thrombosis seen in the lower extremities, bilaterally. -No evidence of popliteal cyst, bilaterally. RIGHT: pulsatile waveforms suggestive of fluid overload  LEFT: Pulsatile waveforms suggestive of fluid overload.  *See table(s) above for measurements  and observations. Electronically signed by Orlie Pollen on 07/19/2021 at 5:21:05 PM.    Final      Assessment and Plan:   Chronic systolic heart failure -Echocardiogram showed LV function of less than 20% and grade 1 diastolic dysfunction.  No regional wall motion abnormality.  No LV thrombus found. -We will add guideline directed medical therapy as tolerated with allowing permissive hypertension -Will need ischemic evaluation at some point  2.  Acute stroke -Concerning for cardioembolic source.  Also has nonocclusive thrombus in the supraclinoid right ICA, 60% RICA and moderate LICA.  -Currently treated with aspirin and Plavix. -Given low EF, neurology recommending anticoagulation with Eliquis.  Not a Coumadin candidate due to interaction with HIV meds.  However, patient does not have LV thrombus or arrhythmia. Could Treat prophylactically but also has anemia with hemoglobin of 7.2 - May consider TEE to r/o TEE  3. Appendicitis s/p appendectomy complicated by retroceal abscess s/p drain placement  - Underwent upsize right lower quadrant drain 6/1 by IR - Abx per ID  4. Uncontrolled DM - Hgb A1c 14.2 - Per primary   5. Anemia - Hgb 7.2 - Denies bleeding - Per primary team   Dr.Jaidynn Balster to see.  Risk Assessment/Risk Scores:   New York Heart Association (NYHA) Functional Class NYHA Class II    For questions or  updates, please contact [place] Please consult [email or web address] for contact info under    SignedLeanor Kail, PA  07/20/2021 2:16 PM   I have seen and examined the patient along with Leanor Kail, PA .  I have reviewed the chart, notes and new data.  I agree with PA/NP's note.  Key new complaints: She denies chest pain at rest with activity.  Prior to her problems with the abdominal ask says she was doing housework without dyspnea on exertion or angina on exertion.  She does not engage in other more intense physical activity and has been more  sedentary since the surgical problems.  Her mother had congestive heart failure and died at age 82.  She does not know other details about her mother's heart problems.  She is not aware of any other family members with heart failure.  She has a remote use of cocaine, but this was as long as 20 years ago.  She does not drink alcohol in large amounts.  She has HIV infection, but undetectable viral load (initially diagnosed in 2010, followed by Dr. Johnnye Sima) Key examination changes: She is lying fully supine in bed without any respiratory difficulty whatsoever.  Lungs are clear, she has mild resting tachycardia, otherwise normal exam including normal jugular venous pulsations and no edema.  She did have edema on arrival. Key new findings / data: I have reviewed her echocardiogram.  I think the ejection fraction is underestimated (I think the EF is actually 25-30%), but I agree that there is global hypokinesis.  The Doppler mitral inflow pattern is consistent with normal left heart filling pressures and the inferior vena cava is not dilated (normal right atrial and left atrial pressure, that is to say euvolemia).  The EKG shows nonspecific changes without evidence of previous myocardial infarction or active ischemia.  PLAN: Appearance on echo and ECG is more consistent with nonischemic cardiomyopathy.  Having said that she does have poorly controlled diabetes and is therefore at risk for coronary disease.  Plan coronary CT angiography, but have to get her heart rate slower first.  Since she is euvolemic we can start treatment with beta-blockers.  Ideally we will get her heart rate down to 65 so we can get good CT images.  Currently in period of permissive hypertension poststroke we will start a low-dose of carvedilol tomorrow.  Plan to repeat a limited echocardiogram with [facility] to get a better understanding of whether or not a left ventricular thrombus is present.  If we do decide on anticoagulation, I would  not recommend warfarin due to the multiple drug interactions and her already complicated list of medicines.  Recent data suggest that Eliquis is noninferior to warfarin for LV thrombus related stroke.  For the time being she is on intravenous heparin.  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 Phoebe Sharps evaluation this is less severe than reported).  Sanda Klein, MD, [place] [phone] 07/20/2021, 3:55 PM

## 2021-07-20 NOTE — Progress Notes (Signed)
Inpatient Diabetes Program Recommendations  AACE/ADA: New Consensus Statement on Inpatient Glycemic Control (2015)  Target Ranges:  Prepandial:   less than 140 mg/dL      Peak postprandial:   less than 180 mg/dL (1-2 hours)      Critically ill patients:  140 - 180 mg/dL   Lab Results  Component Value Date   GLUCAP 159 (H) 07/20/2021   HGBA1C 14.2 (H) 07/19/2021    Review of Glycemic Control  Latest Reference Range & Units 07/19/21 13:02 07/19/21 16:14 07/19/21 21:42 07/20/21 06:25  Glucose-Capillary 70 - 99 mg/dL 376 (H) 283 (H) 151 (H) 159 (H)  (H): Data is abnormally high Diabetes history: type 2 DM Outpatient Diabetes medications: Synjardy 5-500 mg BID, Lantus 30 units QD Current orders for Inpatient glycemic control: Semglee 15 units QD, Novolog 0-15 units TID &HS, Jardiance 10 mg Qd, Metformin 500 mg BID  Inpatient Diabetes Program Recommendations:    Spoke with patient briefly regarding outpatient diabetes management. Verifies that patient has received insulin and oral antidiabetic agents for home use and has needed supplies. Reports she self administers and daughter has also been trained, however, has not being administering because she just received prescription by mail. Nonverbal communication- patient with flat affect, short responses and not engaged into conversation.  Reviewed patient's current A1c of 14.2%. Explained what a A1c is and what it measures. Also reviewed goal A1c with patient, importance of good glucose control @ home, and blood sugar goals. Reviewed patho of dm, need for insulin, role of pancreas, impact to vascular and risk of continued infections, and commorbidities.  Patient reports having what she needs and knows what she needs to do.  When asked if she consumes sugary beverages she shakes her "no". Aware of upcoming appointment with RD and has no further questions.   Thanks, Lujean Rave, MSN, RNC-OB Diabetes Coordinator [phone]  (8a-5p)

## 2021-07-20 NOTE — Progress Notes (Signed)
ID Pharmacy Note   Spoke with Dr.Singh and will use ciprofloxacin 750 mg BID instead of levofloxacin 750 mg daily to allow patient to use some of the ciprofloxacin she already has at home when discharged.    Sharin Mons, PharmD, BCPS, BCIDP Infectious Diseases Clinical Pharmacist Phone: [phone] 07/20/2021 4:02 PM

## 2021-07-20 NOTE — Progress Notes (Signed)
Paged by nurse about 20 beats of SVT with HR in the 150s that converted back to sinus rhythm. Patient was resting comfortably and asymptomatic at that time. HR afterwards in the low 100s. Assessed telemetry which showed a non-sustained narrow complex tachycardia with rates in the 150s with visible p waves so not consistent with SVT that terminated spontaneously and returned to a heart rate in the 100s. Planning to start beta blocker tomorrow. Will continue to monitor.

## 2021-07-20 NOTE — Plan of Care (Signed)
Patient is doing well. No complaints of pain during the shift. Ambulated to the restroom with standby assist. No seizure noted. RLQ dressing is dry and intact. Continue to monitor.   Problem: Education: Goal: Ability to describe self-care measures that may prevent or decrease complications (Diabetes Survival Skills Education) will improve Outcome: Progressing   Problem: Metabolic: Goal: Ability to maintain appropriate glucose levels will improve Outcome: Progressing   Problem: Nutritional: Goal: Progress toward achieving an optimal weight will improve Outcome: Progressing   Problem: Clinical Measurements: Goal: Ability to maintain clinical measurements within normal limits will improve Outcome: Progressing Goal: Will remain free from infection Outcome: Progressing   Problem: Safety: Goal: Ability to remain free from injury will improve Outcome: Progressing

## 2021-07-20 NOTE — Progress Notes (Signed)
STROKE TEAM PROGRESS NOTE   INTERVAL HISTORY RN at the bedside. Pt no acute event overnight, neuro intact. Pt found to have EF < 20%, cardiology on board, recommend TEE. Hb down to 7.2 from yesterday 8.1, likely dilutional, will need monitoring.   Vitals:   07/20/21 0513 07/20/21 0741 07/20/21 1131 07/20/21 1629  BP: (!) 160/95 (!) 150/89 (!) 149/80 (!) 167/102  Pulse: 98 (!) 105 (!) 105 (!) 110  Resp: 17 16 16 16   Temp: 98 F (36.7 C) 98.5 F (36.9 C) 98.2 F (36.8 C) 98.7 F (37.1 C)  TempSrc: Oral Oral Oral Oral  SpO2:  97% 97% 100%  Weight:      Height:       CBC:  Recent Labs  Lab 07/19/21 0359 07/20/21 0347  WBC 6.7 5.7  NEUTROABS 3.0 2.3  HGB 8.1* 7.2*  HCT 26.7* 23.3*  MCV 70.8* 70.0*  PLT 449* 411*   Basic Metabolic Panel:  Recent Labs  Lab 07/18/21 1831 07/19/21 0359 07/20/21 0347  NA 140 140 140  K 3.2* 2.9* 4.3  CL 108 107 112*  CO2 22 26 22   GLUCOSE 324* 257* 155*  BUN 9 6 6   CREATININE 0.89 0.81 0.81  CALCIUM 8.7* 8.6* 8.3*  MG 1.9 1.9  --   PHOS  --  3.2  --    Lipid Panel:  Recent Labs  Lab 07/19/21 0359  CHOL 148  TRIG 118  HDL 34*  CHOLHDL 4.4  VLDL 24  LDLCALC 90   HgbA1c:  Recent Labs  Lab 07/19/21 0359  HGBA1C 14.2*   Urine Drug Screen: No results for input(s): LABOPIA, COCAINSCRNUR, LABBENZ, AMPHETMU, THCU, LABBARB in the last 168 hours.  Alcohol Level No results for input(s): ETH in the last 168 hours.  IMAGING past 24 hours No results found.  PHYSICAL EXAM  Physical Exam  Constitutional: Appears well-developed and well-nourished.  Cardiovascular: Normal rate and regular rhythm.  Respiratory: Effort normal, non-labored breathing  Neuro: Mental Status: Patient is awake, alert, oriented to person, place, month, year, and situation. Patient is able to give a clear and coherent history. No signs of aphasia or neglect Cranial Nerves: II: Visual Fields are full. Pupils are equal, round, and reactive to light.    III,IV, VI: EOMI without ptosis or diploplia. Gaze is midline. V: Facial sensation is symmetric to temperature VII: Facial movement is symmetric resting and smiling VIII: Hearing is intact to voice X: Palate elevates symmetrically XI: Shoulder shrug is symmetric. XII: Tongue protrudes midline without atrophy or fasciculations.  Motor: Tone is normal. Bulk is normal. 5/5 strength was present in all four extremities.  Sensory: Sensation is symmetric to light touch and temperature in the arms and legs. No extinction to DSS present.  Cerebellar: FNF and HKS are intact bilaterally   ASSESSMENT/PLAN [name] is a 56 y.o. female with history of  hypertension, type 2 diabetes, HIV, HSV, recent appendectomy complicated by intra-abdominal abscess requiring percutaneous drainage and antibiotics, who presented to [facility] ED due to seizure-like activity at home and had one episode in ED. She was also in the ED 5/30 for a syncopal episode preceded by nausea and vomiting. MRI shows scattered acute ischemic infarcts involving the subcortical right cerebral hemisphere. No focal neurological deficits.   Stroke:  right occipital WM, right MCA/ACA 3 small acute infarcts, etiology unclear, concerning for cardioembolic source from low EF, especially with potential nonocclusive thrombus at R ICA terminus Code Stroke CT head No acute  abnormality.  CTA head & neck- positive for questionable filling defect - nonocclusive thrombus in the supraclinoid Right ICA. Underlying age advanced atherosclerosis at both carotid bifurcations and ICA siphons. 60% Right ICA origin stenosis (at most 40% on my calculation). Moderate stenosis of the Left ICA cavernous segment MRI  Few scattered subcentimeter acute ischemic infarcts involving the subcortical right cerebral hemisphere as above.  2D Echo EF < 20% Vas duplex DVT- no evidence of DVT LDL 90 HgbA1c 14.2 UDS pending VTE prophylaxis - SCDs No antithrombotic prior to  admission, now on aspirin 81 mg daily and clopidogrel 75 mg daily. May need to consider anticoagulation once Hb stable.  Therapy recommendations:  Pending Disposition:  Pending  Seizure like activity  Neck shaking lasting 10 min per pt without LOC Inconsistent report of arm shaking EEG-independent bilateral temporal slowing, no seizure S/p keppra loading Now on keppra 1000mg  bid   Cardiomyopathy  TTE showed EF < 20% Cardiology on board Will consider TEE to rule out LV thrombus Given low EF, embolic strokes and nonocclusive ICA termius thrombus, will recommend empirical anticoagulation. Ideally with eliquis as coumadin may interfere with her HIV meds. However, currently severe anemic, not good candidate for [facility]. May consider AC once anemia stable.  Hypertension Stable on the high end Gradually normalized BP in 2-3 days Long-term BP goal normotensive  Hyperlipidemia LDL 90, goal < 70 Add Crestor 20mg   Continue statin at discharge  Diabetes type II Uncontrolled Home meds:  Insulin HgbA1c 14.2, goal < 7.0 CBGs hyperglycemia SSI Close PCP follow up for better DM control  Anemia  Hb 8.1->7.2 Could be dilutional No AC for now until anemia improved and stable.  Off IVF  Other Active Problems Hypokalemia K 2.9 -> supplement HIV on HARRT - reported in good control, ID on board Abdominal abscess CT Abd- Patient is status post appendectomy. Enhancing fluid collection containing air in the right paracolic gutter extending into the adjacent intramuscular wall consistent with abscess. Percutaneous drainage catheter in place within this collection. Gen surgery consulted for management  Augmentin and cipro at home -> now on flagyl and cipro - ID on board  Hospital day # 2  [facility], MD PhD Stroke Neurology 07/20/2021 5:46 PM    To contact Stroke Continuity provider, please refer to [place] Plan. After hours, contact General Neurology

## 2021-07-20 NOTE — Progress Notes (Signed)
Supervising Physician: Aletta Edouard  Patient Status:  [facility] - In-pt  Chief Complaint: Laparoscopic appendectomy with post-procedure retrocecal abscess s/p drain placement in IR (At [facility]) followed by drain exchange/upsize 07/19/21 by Dr. Vernard Gambles  Subjective: Patient in bed watching TV. She denies any significant pain/discomfort.   Allergies: Fluorescein  Medications: Prior to Admission medications   Medication Sig Start Date End Date Taking? Authorizing Provider  amoxicillin-clavulanate (AUGMENTIN) 875-125 MG tablet Take 1 tablet by mouth See admin instructions. Bid x 14 days 07/12/21  Yes [provider]  Cholecalciferol (VITAMIN D3) 5000 units TABS Take 5,000 Units by mouth daily.   Yes [provider]  ciprofloxacin (CIPRO) 750 MG tablet Take 750 mg by mouth See admin instructions. Bid x 14 days 07/12/21  Yes [provider]  DULoxetine (CYMBALTA) 60 MG capsule Take 1 capsule (60 mg total) by mouth daily. 05/23/21 09/22/21 Yes Demaio, Alexa, MD  Empagliflozin-metFORMIN HCl (SYNJARDY) 5-500 MG TABS Take 1 tablet by mouth 2 (two) times daily. 07/18/21  Yes Demaio, Alexa, MD  emtricitabine-rilpivir-tenofovir AF (ODEFSEY) 200-25-25 MG TABS tablet Take 1 tablet by mouth daily with breakfast. 07/18/21  Yes Demaio, Alexa, MD  insulin glargine (LANTUS SOLOSTAR) 100 UNIT/ML Solostar Pen Inject 30 Units into the skin daily. 07/18/21 02/03/22 Yes Demaio, Alexa, MD  losartan-hydrochlorothiazide (HYZAAR) 50-12.5 MG tablet Take 1 tablet by mouth daily. 05/08/21  Yes Lajean Manes, MD  ondansetron (ZOFRAN-ODT) 4 MG disintegrating tablet Take 1 tablet (4 mg total) by mouth every 8 (eight) hours as needed for up to 3 days for nausea or vomiting. 07/18/21 07/21/21 Yes Cardama, Grayce Sessions, MD  Accu-Chek Softclix Lancets lancets Check blood sugar 3 times per day 05/23/21   Scarlett Presto, MD  B-D UF III MINI PEN NEEDLES 31G X 5 MM MISC Use to inject insulin one time daily 07/18/21   Demaio,  Alexa, MD  Blood Glucose Monitoring Suppl (ACCU-CHEK GUIDE ME) w/Device KIT 1 kit by Does not apply route daily. Patient not taking: Reported on 03/16/2021 09/30/19   Kuppelweiser, Cassie L, RPH-CPP  Blood Glucose Monitoring Suppl (ACCU-CHEK NANO SMARTVIEW) w/Device KIT 1 each by Does not apply route 2 (two) times daily. Check blood sugar 4x a day before meals and bedtime  2 days each week. Dx code- 250.00. 07/18/21   Demaio, Roxine Caddy, MD  glucose blood (ACCU-CHEK GUIDE) test strip Check blood sugar 3 times per day 07/18/21   Demaio, Alexa, MD  Lancets Misc. (ACCU-CHEK SOFTCLIX LANCET DEV) KIT Use to check blood sugar up to 3 times a day before meals. 07/18/21   Demaio, Alexa, MD     Vital Signs: BP (!) 149/80 (BP Location: Left Arm)   Pulse (!) 105   Temp 98.2 F (36.8 C) (Oral)   Resp 16   Ht $R'5\' 2"'zO$  (1.575 m)   Wt 140 lb (63.5 kg)   SpO2 97%   BMI 25.61 kg/m   Physical Exam Constitutional:      General: She is not in acute distress.    Appearance: She is not ill-appearing.  Pulmonary:     Effort: Pulmonary effort is normal.  Abdominal:     Palpations: Abdomen is soft.     Tenderness: There is no abdominal tenderness.     Comments: RLQ drain to gravity. Approximately 20 ml of tan/purulent fluid in bag. Drain easily flushed with 5 ml NS. Dressing is clean/dry/intact.   Skin:    General: Skin is warm and dry.  Neurological:  Mental Status: She is alert and oriented to person, place, and time.    Imaging: CT ANGIO HEAD NECK W WO CM  Addendum Date: 07/19/2021   ADDENDUM REPORT: 07/19/2021 07:18 ADDENDUM: Study discussed by telephone with Drs. BHAGAT and Arora on 07/19/2021 at 0712 hours. Electronically Signed   By: Genevie Ann M.D.   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 History of HIV. EXAM: CT ANGIOGRAPHY HEAD AND NECK TECHNIQUE: Multidetector CT imaging  of the head and neck was performed using the standard protocol during bolus administration of intravenous contrast. Multiplanar CT image reconstructions and MIPs were obtained to evaluate the vascular anatomy. Carotid stenosis measurements (when applicable) are obtained utilizing NASCET criteria, using the distal internal carotid diameter as the denominator. RADIATION DOSE REDUCTION: This exam was performed according to the departmental dose-optimization program which includes automated exposure control, adjustment of the mA and/or kV according to patient size and/or use of iterative reconstruction technique. CONTRAST:  68mL OMNIPAQUE IOHEXOL 350 MG/ML SOLN COMPARISON:  Brain MRI 0118 hours today.  Head CT yesterday. FINDINGS: Initial imaging attempt at 0446 hours with failed contrast bolus reportedly due to peripheral IV infiltration. The study was repeated with new IV access at 0638 hours. CTA NECK Skeleton: No acute osseous abnormality identified. Upper chest: Mild dependent atelectasis. Partially visible nonspecific mediastinal lymphadenopathy, increasing toward the level of the carina. Other neck: Negative; no neck mass or cervical lymphadenopathy identified. Aortic arch: Mild Calcified aortic atherosclerosis. 3 vessel arch configuration. Right carotid system: Tortuous right CCA origin without plaque or stenosis. Moderate soft and calcified plaque at the right ICA origin with 60 % stenosis with respect to the distal vessel. Right ICA remains patent to the skull base. Left carotid system: Negative left CCA aside from mild tortuosity. Moderate soft and calcified plaque at the left ICA origin without stenosis. Vertebral arteries: Negative proximal right subclavian artery. Normal right vertebral artery origin. Some of the right V1 segment is mildly obscured from venous contrast. But the right vertebral artery remains patent to the skull base with no significant stenosis identified. Negative proximal left subclavian  artery. Calcified plaque at the left vertebral artery origin but no associated stenosis. Dominant appearing left vertebral artery is patent to the skull base without stenosis. CTA HEAD Posterior circulation: Dominant left V4 segment. No distal vertebral or vertebrobasilar junction plaque or stenosis. Normal PICA origins. Patent basilar artery without stenosis. Patent SCA and PCA origins. Posterior communicating arteries are diminutive or absent. Bilateral PCA branches are within normal limits. Anterior circulation: Patent ICA siphons but ICA tortuosity and calcified plaque bilaterally. On the right side there is a questionable filling defect in the supraclinoid ICA on series 12, image 113 with up to moderate stenosis there. Right ICA terminus, right MCA and ACA origin remain patent. On the left side there is moderate stenosis in the distal cavernous segment with patent left ICA terminus. Mild irregularity at the left MCA and ACA origins but no significant stenosis (series 16 image 20). Diminutive or absent anterior communicating artery. Bilateral ACA branches are within normal limits. Left MCA M1 segment and bifurcation are patent without stenosis. Left MCA branches are within normal limits. Right MCA M1 segment and bifurcation are patent without stenosis. Right MCA branches are within normal limits. Venous sinuses: Patent. Anatomic variants: Dominant left vertebral artery. Review of the MIP images confirms the above findings IMPRESSION: 1.  Negative for large vessel occlusion. But positive for questionable filling defect - nonocclusive thrombus in the supraclinoid Right ICA (series 10, image 58). 2. Underlying age advanced atherosclerosis at both carotid bifurcations and ICA siphons. 60% Right ICA origin stenosis. Moderate stenosis of the Left ICA cavernous segment. 3. Mild irregularity at the left MCA and ACA origins without significant stenosis. No posterior circulation stenosis. 4. 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 Recommend follow-up Chest CT with IV contrast. 5. Aortic Atherosclerosis (ICD10-I70.0). Electronically Signed: By: Genevie Ann M.D. On: 07/19/2021 07:10   CT Head Wo Contrast  Result Date: 07/18/2021 CLINICAL DATA:  Seizure, new onset. No history of trauma. Seizure like activity. EXAM: CT HEAD WITHOUT CONTRAST TECHNIQUE: Contiguous axial images were obtained from the base of the skull through the vertex without intravenous contrast. RADIATION DOSE REDUCTION: This exam was performed according to the departmental dose-optimization program which includes automated exposure control, adjustment of the mA and/or kV according to patient size and/or use of iterative reconstruction technique. COMPARISON:  None Available. FINDINGS: Brain: No evidence of acute infarction, hemorrhage, hydrocephalus, extra-axial collection or mass lesion/mass effect. Vascular: No hyperdense vessel or unexpected calcification. Skull: Normal. Negative for fracture or focal lesion. Sinuses/Orbits: No acute finding. Other: None. IMPRESSION: No CT evidence of acute intracranial abnormality. Electronically Signed   By: Keane Police D.O.   On: 07/18/2021 21:54   MR Brain W and Wo Contrast  Result Date: 07/19/2021 CLINICAL DATA:  Initial evaluation for new onset seizure. EXAM: MRI HEAD WITHOUT AND WITH CONTRAST TECHNIQUE: Multiplanar, multiecho pulse sequences of the brain and surrounding structures were obtained without and with intravenous contrast. CONTRAST:  22mL GADAVIST GADOBUTROL 1 MMOL/ML IV SOLN COMPARISON:  CT from 07/18/2021. FINDINGS: Brain: Cerebral volume within normal limits. Mild chronic microvascular ischemic disease noted involving the periventricular white matter. Few small acute ischemic infarcts seen involving the subcortical right cerebral hemisphere (series 3, images 38, 35, 27). Largest area of ischemia seen at the right  periatrial white matter and measures up to 7 mm. No associated hemorrhage or mass effect. Gray-white matter differentiation otherwise maintained. No areas of chronic cortical infarction or other insult. No visible acute or chronic intracranial blood products. No mass lesion, midline shift or mass effect. No hydrocephalus or extra-axial fluid collection. Pituitary gland suprasellar region within normal limits. Midline structures intact and normally formed. No intrinsic temporal lobe abnormality. No abnormal enhancement. Vascular: Major intracranial vascular flow voids are grossly maintained at the skull base on this motion degraded exam. Skull and upper cervical spine: Craniocervical junction within normal limits. Bone marrow signal intensity normal. No scalp soft tissue abnormality. Sinuses/Orbits: Globes and orbital soft tissues within normal limits. Paranasal sinuses are largely clear. No significant mastoid effusion. Other: None. IMPRESSION: 1. 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 No associated hemorrhage or mass effect. 2. No other acute intracranial abnormality. 3. Underlying mild chronic microvascular ischemic disease. Electronically Signed   By: Jeannine Boga M.D.   On: 07/19/2021 03:01   CT ABDOMEN PELVIS W CONTRAST  Result Date: 07/18/2021 CLINICAL DATA:  Appendix removed on 06/22/2021. Evaluate for bowel obstruction. Abdominal pain. EXAM: CT ABDOMEN AND PELVIS WITH CONTRAST TECHNIQUE: Multidetector CT imaging of the abdomen and pelvis was performed using the standard protocol following bolus administration of intravenous contrast. RADIATION DOSE REDUCTION: This exam was performed according to the departmental dose-optimization program which includes automated exposure control,  adjustment of the mA and/or kV according to patient size and/or use of iterative reconstruction technique. CONTRAST:  163mL OMNIPAQUE IOHEXOL 300  MG/ML  SOLN COMPARISON:  None Available. FINDINGS: Lower chest: No acute abnormality. Hepatobiliary: No focal liver abnormality is seen. No gallstones, gallbladder wall thickening, or biliary dilatation. Pancreas: Unremarkable. No pancreatic ductal dilatation or surrounding inflammatory changes. Spleen: Normal in size without focal abnormality. Adrenals/Urinary Tract: There is a rounded hypodensity in the left kidney which is too small to characterize, likely a cyst. Otherwise, the kidneys, adrenal glands, and bladder are within normal limits. Stomach/Bowel: Patient is status post appendectomy. There is an enhancing fluid collection containing air in the right paracolic gutter extending into the posterior right abdominal intramuscular wall. This collection measures 4.3 x 5.4 x 6.1 cm. Percutaneous drainage catheter is seen coiled in this collection. There is no bowel obstruction or free air. There is a large amount of stool throughout the colon. Stomach is within normal limits. Vascular/Lymphatic: No significant vascular findings are present. No enlarged abdominal or pelvic lymph nodes. Reproductive: Status post hysterectomy. No adnexal masses. Other: No free fluid or focal abdominal wall hernia. There is subcutaneous stranding at the level of the percutaneous catheter and mild edema of the intramuscular wall at this level as well. Musculoskeletal: No acute or significant osseous findings. IMPRESSION: 1. Patient is status post appendectomy. Enhancing fluid collection containing air in the right paracolic gutter extending into the adjacent intramuscular wall consistent with abscess. Percutaneous drainage catheter in place within this collection. 2. No evidence for bowel obstruction. Electronically Signed   By: Ronney Asters M.D.   On: 07/18/2021 03:52   IR Catheter Tube Change  Result Date: 07/19/2021 INDICATION: Previous active appendectomy, postop abscess, status post percutaneous drain catheter placement.  Recent CT shows incomplete evacuation of the complex residual abscess. Drainage vision requested. EXAM: EXCHANGE OF ABDOMINAL DRAIN CATHETER UNDER FLUOROSCOPY MEDICATIONS: no periprocedural antibiotics were indicated ANESTHESIA/SEDATION: Lidocaine 1% subcutaneous COMPLICATIONS: None immediate. PROCEDURE: Informed written consent was obtained from the patient after a thorough discussion of the procedural risks, benefits and alternatives. All questions were addressed. Maximal Sterile Barrier Technique was utilized including caps, mask, sterile gowns, sterile gloves, sterile drape, hand hygiene and skin antiseptic. A timeout was performed prior to the initiation of the procedure. The previously placed right lower quadrant catheter and surrounding skin were prepped with Betadine, draped in usual sterile fashion. 1% lidocaine was infiltrated subcutaneously around the catheter. Small gentle contrast injection opacifies a small right lower quadrant residual abscess cavity. No fistula to bowel was identified. The catheter was removed over an Amplatz wire and a new 12 French pigtail drain catheter was advanced and formed centrally within the collection. Several mL of thin purulent material spontaneously returned out the catheter. The catheter was secured to the skin with 0 Prolene suture, and placed to gravity drain bag. The patient tolerated the procedure well. IMPRESSION: 1. Technically successful exchange and upsizing of right lower quadrant pigtail drain catheter. Electronically Signed   By: Lucrezia Europe M.D.   On: 07/19/2021 16:21   DG Chest Port 1 View  Result Date: 07/18/2021 CLINICAL DATA:  Syncope. EXAM: PORTABLE CHEST 1 VIEW COMPARISON:  Chest x-ray 01/30/2004 FINDINGS: The heart size and mediastinal contours are within normal limits. Both lungs are clear. The visualized skeletal structures are unremarkable. IMPRESSION: No active disease. Electronically Signed   By: Ronney Asters M.D.   On: 07/18/2021 01:32    EEG adult  Result Date: 07/19/2021 Zeb Comfort  Jenetta Downer, MD     07/19/2021 10:59 AM Patient Name: Ashley Vaughn MRN: [id number] Epilepsy Attending: Lora Havens Referring Physician/Provider: Lorenza Chick, MD Date: 07/19/2021 Duration: 23.30 mins Patient history: 55yo F with new onset seizure. EEG to evaluate for seizure. Level of alertness: Awake, asleep AEDs during EEG study: LEV Technical aspects: This EEG study was done with scalp electrodes positioned according to the 10-20 International system of electrode placement. Electrical activity was acquired at a sampling rate of 500Hz  and reviewed with a high frequency filter of 70Hz  and a low frequency filter of 1Hz . EEG data were recorded continuously and digitally stored. Description: The posterior dominant rhythm consists of 9-10 Hz activity of moderate voltage (25-35 uV) seen predominantly in posterior head regions, symmetric and reactive to eye opening and eye closing. Sleep was characterized by vertex waves, sleep spindles (12 to 14 Hz), maximal frontocentral region. EEG showed intermittent 3-5Hz  theta-delta slowing in bilateral temporal region. Hyperventilation and photic stimulation were not performed.   ABNORMALITY -Intermittent slow, bilateral temporal region IMPRESSION: This study is suggestive of independent bilateral temporal region, non specific etiology. No seizures or epileptiform discharges were seen throughout the recording. Lora Havens   ECHOCARDIOGRAM COMPLETE  Result Date: 07/19/2021    ECHOCARDIOGRAM REPORT   Patient Name:   SHEMAIAH ROUND Buchler Date of Exam: 07/19/2021 Medical Rec #:  [id number]   Height:       62.0 in Accession #:    [id number]  Weight:       141.0 lb Date of Birth:  1965/08/03   BSA:          1.648 m Patient Age:    56 years    BP:           152/90 mmHg Patient Gender: F           HR:           90 bpm. Exam Location:  Inpatient Procedure: 2D Echo, Cardiac Doppler and Color Doppler Indications:    Stroke  History:         Patient has no prior history of Echocardiogram examinations.                 Risk Factors:Hypertension and Diabetes.  Sonographer:    Jefferey Pica Referring Phys: [id number] [place]  1. Left ventricular ejection fraction, by estimation, is <20%. The left ventricle has severely decreased function. The left ventricle has no regional wall motion abnormalities. Left ventricular diastolic parameters are consistent with Grade I diastolic dysfunction (impaired relaxation).  2. Right ventricular systolic function is normal. The right ventricular size is normal.  3. The mitral valve is normal in structure. Trivial mitral valve regurgitation. No evidence of mitral stenosis.  4. The aortic valve is normal in structure. Aortic valve regurgitation is not visualized. No aortic stenosis is present.  5. The inferior vena cava is normal in size with greater than 50% respiratory variability, suggesting right atrial pressure of 3 mmHg. Comparison(s): No prior Echocardiogram. FINDINGS  Left Ventricle: Left ventricular ejection fraction, by estimation, is <20%. The left ventricle has severely decreased function. The left ventricle has no regional wall motion abnormalities. The left ventricular internal cavity size was normal in size. There is no left ventricular hypertrophy. Left ventricular diastolic parameters are consistent with Grade I diastolic dysfunction (impaired relaxation). Right Ventricle: The right ventricular size is normal. No increase in right ventricular wall thickness. Right ventricular systolic function is normal. Left Atrium: Left atrial  size was normal in size. Right Atrium: Right atrial size was normal in size. Pericardium: There is no evidence of pericardial effusion. Mitral Valve: The mitral valve is normal in structure. Trivial mitral valve regurgitation. No evidence of mitral valve stenosis. Tricuspid Valve: The tricuspid valve is normal in structure. Tricuspid valve regurgitation is not  demonstrated. No evidence of tricuspid stenosis. Aortic Valve: The aortic valve is normal in structure. Aortic valve regurgitation is not visualized. No aortic stenosis is present. Aortic valve peak gradient measures 4.0 mmHg. Pulmonic Valve: The pulmonic valve was normal in structure. Pulmonic valve regurgitation is not visualized. No evidence of pulmonic stenosis. Aorta: The aortic root is normal in size and structure. Venous: The inferior vena cava is normal in size with greater than 50% respiratory variability, suggesting right atrial pressure of 3 mmHg. IAS/Shunts: No atrial level shunt detected by color flow Doppler.  LEFT VENTRICLE PLAX 2D LVIDd:         4.80 cm     Diastology LVIDs:         4.20 cm     LV e' lateral:   7.65 cm/s LV PW:         1.00 cm     LV E/e' lateral: 11.8 LV IVS:        0.90 cm LVOT diam:     2.00 cm LV SV:         53 LV SV Index:   32 LVOT Area:     3.14 cm  LV Volumes (MOD) LV vol d, MOD A4C: 87.2 ml LV vol s, MOD A4C: 68.1 ml LV SV MOD A4C:     87.2 ml RIGHT VENTRICLE             IVC RV Basal diam:  2.50 cm     IVC diam: 2.10 cm RV S prime:     11.90 cm/s TAPSE (M-mode): 2.3 cm LEFT ATRIUM             Index        RIGHT ATRIUM           Index LA diam:        3.40 cm 2.06 cm/m   RA Area:     12.70 cm LA Vol (A2C):   47.7 ml 28.95 ml/m  RA Volume:   27.80 ml  16.87 ml/m LA Vol (A4C):   49.1 ml 29.80 ml/m LA Biplane Vol: 51.2 ml 31.07 ml/m  AORTIC VALVE                 PULMONIC VALVE AV Area (Vmax): 2.59 cm     PV Vmax:       0.65 m/s AV Vmax:        99.70 cm/s   PV Peak grad:  1.7 mmHg AV Peak Grad:   4.0 mmHg LVOT Vmax:      82.20 cm/s LVOT Vmean:     48.100 cm/s LVOT VTI:       0.168 m  AORTA Ao Root diam: 3.20 cm MITRAL VALVE MV Area (PHT): 4.06 cm     SHUNTS MV Decel Time: 187 msec     Systemic VTI:  0.17 m MV E velocity: 90.00 cm/s   Systemic Diam: 2.00 cm MV A velocity: 101.00 cm/s MV E/A ratio:  0.89 Kardie Tobb DO Electronically signed by Thomasene Ripple DO Signature  Date/Time: 07/19/2021/4:33:03 PM    Final    VAS [place] LOWER EXTREMITY VENOUS (DVT)  Result Date: 07/19/2021  Lower Venous DVT Study Patient Name:  MAYMIE BRUNKE Khiev  Date of Exam:   07/19/2021 Medical Rec #: [id number]    Accession #:    [id number] Date of Birth: [birth year]    Patient Gender: F Patient Age:   30 years Exam Location:  [facility] Procedure:      VAS [place] LOWER EXTREMITY VENOUS (DVT) Referring Phys: Cornelius Moras XU --------------------------------------------------------------------------------  Indications: Stroke.  Comparison Study: No prior study on file Performing Technologist: Sharion Dove RVS  Examination Guidelines: A complete evaluation includes B-mode imaging, spectral Doppler, color Doppler, and power Doppler as needed of all accessible portions of each vessel. Bilateral testing is considered an integral part of a complete examination. Limited examinations for reoccurring indications may be performed as noted. The reflux portion of the exam is performed with the patient in reverse Trendelenburg.  +--------+---------------+---------+-----------+----------------+-------------+ RIGHT   CompressibilityPhasicitySpontaneityProperties      Thrombus                                                                 Aging         +--------+---------------+---------+-----------+----------------+-------------+ CFV     Full                               pulsatile                                                                waveforms                     +--------+---------------+---------+-----------+----------------+-------------+ SFJ     Full                                                             +--------+---------------+---------+-----------+----------------+-------------+ FV Prox Full                                                             +--------+---------------+---------+-----------+----------------+-------------+ FV Mid  Full                                                              +--------+---------------+---------+-----------+----------------+-------------+ FV      Full  Distal                                                                   +--------+---------------+---------+-----------+----------------+-------------+ PFV     Full                                                             +--------+---------------+---------+-----------+----------------+-------------+ POP     Full                               pulsatile                                                                waveforms                     +--------+---------------+---------+-----------+----------------+-------------+ PTV     Full                                                             +--------+---------------+---------+-----------+----------------+-------------+ PERO    Full                                                             +--------+---------------+---------+-----------+----------------+-------------+   +--------+---------------+---------+-----------+----------------+-------------+ LEFT    CompressibilityPhasicitySpontaneityProperties      Thrombus                                                                 Aging         +--------+---------------+---------+-----------+----------------+-------------+ CFV     Full                               pulsatile                                                                waveforms                     +--------+---------------+---------+-----------+----------------+-------------+  SFJ     Full                                                             +--------+---------------+---------+-----------+----------------+-------------+ FV Prox Full                                                              +--------+---------------+---------+-----------+----------------+-------------+ FV Mid  Full                                                             +--------+---------------+---------+-----------+----------------+-------------+ FV      Full                                                             Distal                                                                   +--------+---------------+---------+-----------+----------------+-------------+ PFV     Full                                                             +--------+---------------+---------+-----------+----------------+-------------+ POP     Full                               pulsatile                                                                waveforms                     +--------+---------------+---------+-----------+----------------+-------------+ PTV     Full                                                             +--------+---------------+---------+-----------+----------------+-------------+ PERO    Full                                                             +--------+---------------+---------+-----------+----------------+-------------+  Summary: BILATERAL: - No evidence of deep vein thrombosis seen in the lower extremities, bilaterally. -No evidence of popliteal cyst, bilaterally. RIGHT: pulsatile waveforms suggestive of fluid overload  LEFT: Pulsatile waveforms suggestive of fluid overload.  *See table(s) above for measurements and observations. Electronically signed by Orlie Pollen on 07/19/2021 at 5:21:05 PM.    Final     Labs:  CBC: Recent Labs    07/18/21 0010 07/18/21 0158 07/18/21 1831 07/19/21 0359 07/20/21 0347  WBC 9.5  --  7.2 6.7 5.7  HGB 8.0* 8.8* 8.4* 8.1* 7.2*  HCT 25.5* 26.0* 28.8* 26.7* 23.3*  PLT 515*  --  504* 449* 411*    COAGS: Recent Labs    07/20/21 0347  INR 1.1    BMP: Recent Labs    07/18/21 0010 07/18/21 0158  07/18/21 1831 07/19/21 0359 07/20/21 0347  NA 137 139 140 140 140  K 4.0 3.4* 3.2* 2.9* 4.3  CL 104  --  108 107 112*  CO2 22  --  $R'22 26 22  'Df$ GLUCOSE 509*  --  324* 257* 155*  BUN 13  --  $R'9 6 6  'Al$ CALCIUM 8.4*  --  8.7* 8.6* 8.3*  CREATININE 1.11*  --  0.89 0.81 0.81  GFRNONAA 59*  --  >60 >60 >60    LIVER FUNCTION TESTS: Recent Labs    05/04/21 0903 07/18/21 0010 07/19/21 0359 07/20/21 0347  BILITOT 0.2 0.6 0.2* 0.2*  AST $Re'10 30 17 'RXU$ 47*  ALT $Re'9 16 16 28  'XmQ$ ALKPHOS  --  134* 116 129*  PROT 7.6 6.7 6.5 6.3*  ALBUMIN  --  2.4* 2.3* 2.1*    Assessment and Plan:  Laparoscopic appendectomy with post-procedure retrocecal abscess s/p drain placement in IR (At [facility]) followed by drain exchange/upsize 07/19/21 by Dr. Fabio Bering Location: RLQ Size: Fr size: 12 Fr Date of placement: 07/19/21 Currently to: Drain collection device: gravity 24 hour output:  Output by Drain (mL) 07/18/21 0701 - 07/18/21 1900 07/18/21 1901 - 07/19/21 0700 07/19/21 0701 - 07/19/21 1900 07/19/21 1901 - 07/20/21 0700 07/20/21 0701 - 07/20/21 1314  Closed System Drain Right RLQ 12 Fr.    15     Interval imaging/drain manipulation:  None  Current examination: Flushes/aspirates easily.  Insertion site unremarkable. Suture and stat lock in place. Dressed appropriately.   Plan: Continue TID flushes with 5 cc NS. Record output Q shift. Dressing changes QD or PRN if soiled.  Call IR APP or on call IR MD if difficulty flushing or sudden change in drain output.  Repeat imaging/possible drain injection once output < 10 mL/QD (excluding flush material.)  Discharge planning: Please contact IR APP or on call IR MD prior to patient d/c to ensure appropriate follow up plans are in place. Typically patient will follow up with IR clinic 10-14 days post d/c for repeat imaging/possible drain injection. IR scheduler will contact patient with date/time of appointment. Patient will need to flush drain QD with 5 cc NS,  record output QD, dressing changes every 2-3 days or earlier if soiled.   IR will continue to follow - please call with questions or concerns.  Electronically Signed: Soyla Dryer, AGACNP-BC [phone] 07/20/2021, 1:11 PM   I spent a total of 15 Minutes at the the patient's bedside AND on the patient's hospital floor or unit, greater than 50% of which was counseling/coordinating care for RLQ drain

## 2021-07-21 ENCOUNTER — Inpatient Hospital Stay (HOSPITAL_COMMUNITY): Payer: Medicaid Other

## 2021-07-21 DIAGNOSIS — I6389 Other cerebral infarction: Secondary | ICD-10-CM | POA: Diagnosis not present

## 2021-07-21 DIAGNOSIS — R569 Unspecified convulsions: Secondary | ICD-10-CM | POA: Diagnosis not present

## 2021-07-21 LAB — CBC
HCT: 25.5 % — ABNORMAL LOW (ref 36.0–46.0)
Hemoglobin: 7.9 g/dL — ABNORMAL LOW (ref 12.0–15.0)
MCH: 21.5 pg — ABNORMAL LOW (ref 26.0–34.0)
MCHC: 31 g/dL (ref 30.0–36.0)
MCV: 69.3 fL — ABNORMAL LOW (ref 80.0–100.0)
Platelets: 469 10*3/uL — ABNORMAL HIGH (ref 150–400)
RBC: 3.68 MIL/uL — ABNORMAL LOW (ref 3.87–5.11)
RDW: 17 % — ABNORMAL HIGH (ref 11.5–15.5)
WBC: 7.4 10*3/uL (ref 4.0–10.5)
nRBC: 0 % (ref 0.0–0.2)

## 2021-07-21 LAB — COMPREHENSIVE METABOLIC PANEL
ALT: 30 U/L (ref 0–44)
AST: 35 U/L (ref 15–41)
Albumin: 2.3 g/dL — ABNORMAL LOW (ref 3.5–5.0)
Alkaline Phosphatase: 156 U/L — ABNORMAL HIGH (ref 38–126)
Anion gap: 8 (ref 5–15)
BUN: 6 mg/dL (ref 6–20)
CO2: 24 mmol/L (ref 22–32)
Calcium: 9 mg/dL (ref 8.9–10.3)
Chloride: 106 mmol/L (ref 98–111)
Creatinine, Ser: 1.03 mg/dL — ABNORMAL HIGH (ref 0.44–1.00)
GFR, Estimated: >60 mL/min (ref >60)
Glucose, Bld: 118 mg/dL — ABNORMAL HIGH (ref 70–99)
Potassium: 3.9 mmol/L (ref 3.5–5.1)
Sodium: 138 mmol/L (ref 135–145)
Total Bilirubin: 0.2 mg/dL — ABNORMAL LOW (ref 0.3–1.2)
Total Protein: 6.8 g/dL (ref 6.5–8.1)

## 2021-07-21 LAB — GLUCOSE, CAPILLARY
Glucose-Capillary: 172 mg/dL — ABNORMAL HIGH (ref 70–99)
Glucose-Capillary: 164 mg/dL — ABNORMAL HIGH (ref 70–99)
Glucose-Capillary: 81 mg/dL (ref 70–99)
Glucose-Capillary: 108 mg/dL — ABNORMAL HIGH (ref 70–99)

## 2021-07-21 LAB — RETIC PANEL
Immature Retic Fract: 22.1 % — ABNORMAL HIGH (ref 2.3–15.9)
RBC.: 3.72 MIL/uL — ABNORMAL LOW (ref 3.87–5.11)
Retic Count, Absolute: 126.5 10*3/uL (ref 19.0–186.0)
Retic Ct Pct: 3.4 % — ABNORMAL HIGH (ref 0.4–3.1)
Reticulocyte Hemoglobin: 24 pg — ABNORMAL LOW (ref >27.9)

## 2021-07-21 LAB — LACTATE DEHYDROGENASE: LDH: 144 U/L (ref 98–192)

## 2021-07-21 LAB — ECHOCARDIOGRAM LIMITED
Height: 62 in
Weight: 2240 oz

## 2021-07-21 LAB — SAVE SMEAR(SSMR), FOR PROVIDER SLIDE REVIEW

## 2021-07-21 MED ORDER — CARVEDILOL 6.25 MG PO TABS
6.2500 mg | ORAL_TABLET | Freq: Two times a day (BID) | ORAL | Status: DC
  Administered 2021-07-21 – 2021-07-22 (×2): 6.25 mg via ORAL
  Filled 2021-07-21 (×2): qty 1

## 2021-07-21 MED ORDER — LOSARTAN POTASSIUM 50 MG PO TABS
100.0000 mg | ORAL_TABLET | Freq: Every day | ORAL | Status: DC
  Administered 2021-07-22 – 2021-07-23 (×2): 100 mg via ORAL
  Filled 2021-07-21 (×2): qty 2

## 2021-07-21 MED ORDER — INSULIN GLARGINE-YFGN 100 UNIT/ML ~~LOC~~ SOLN
20.0000 [IU] | Freq: Every day | SUBCUTANEOUS | Status: DC
  Administered 2021-07-22 – 2021-07-27 (×6): 20 [IU] via SUBCUTANEOUS
  Filled 2021-07-21 (×6): qty 0.2

## 2021-07-21 MED ORDER — PERFLUTREN LIPID MICROSPHERE
1.0000 mL | INTRAVENOUS | Status: AC | PRN
  Administered 2021-07-21: 5 mL via INTRAVENOUS

## 2021-07-21 NOTE — Plan of Care (Signed)
Patient is doing well. No complaints of pain or discomfort during the shift. Dressing change done, no redness or discharge noted around the site. Drainage flushes smoothly.   Problem: Education: Goal: Ability to describe self-care measures that may prevent or decrease complications (Diabetes Survival Skills Education) will improve Outcome: Progressing   Problem: Health Behavior/Discharge Planning: Goal: Ability to manage health-related needs will improve Outcome: Progressing   Problem: Metabolic: Goal: Ability to maintain appropriate glucose levels will improve Outcome: Progressing   Problem: Clinical Measurements: Goal: Ability to maintain clinical measurements within normal limits will improve Outcome: Progressing   Problem: Activity: Goal: Risk for activity intolerance will decrease Outcome: Progressing   Problem: Safety: Goal: Ability to remain free from injury will improve Outcome: Progressing

## 2021-07-21 NOTE — Progress Notes (Signed)
IMTS Daily Note  Subjective: Patient reports no symptoms this morning.  She has no pain, no N/V, no weakness, she is somewhat sleepy.  I reviewed some of her history.  She reports no blood loss, no history of anemia that she is aware of and no history of thalassemia.  She notes that her daughter may have a blood disorder.  She is not aware of any other blood disorders in family members.  She specifically denies urinary blood loss, vaginal bleeding, bleeding in the stool or hemoptysis.  Reviewed her last hospitalization at [facility] and during that stay, she had a drop in Hgb to around 8, averaging 7-8.  This has remained stable since that time.  Currently, with no evidence of blood loss, I do think her anemia is stable since mid May of this year.   Objective:  Vital signs in last 24 hours: Vitals:   07/20/21 1923 07/20/21 2010 07/20/21 2334 07/21/21 0327  BP: (!) 168/101 (!) 164/96 (!) 170/102 (!) 160/99  Pulse: (!) 112 (!) 109 (!) 110 (!) 103  Resp: 20 20 18 20   Temp: 99.3 F (37.4 C) 98.4 F (36.9 C) 98.9 F (37.2 C) 98 F (36.7 C)  TempSrc: Oral Oral Oral Oral  SpO2: 98% 99% 94% 97%  Weight:      Height:       Gen: Sleepy but alert HENT: Neck is supple CV: Regular, tachycardic, no murmur, no LE edema Pulm: Breathing comfortably on room air, lying flat Abd: Soft, +BS, non tender, abdominal drain with minimal pus in bag MSK: Normal tone and bulk Skin: warm and dry Neuro: Moving easily in bed, drowsy, strength intact in upper and lower extremities bilaterally, 5/5   Assessment/Plan:  Seizure like activity - No further episodes of seizure like activity - She is on keppra after a keppra load - May not need continued anti-epileptic, will discuss with neurology  Embolic CVA (cerebral vascular accident) - Source includes ICA (about 40% occluded, thrombus) vs. Intracardiac thrombus - Currently not on [facility] given concern for anemia (see [facility] note) - Aspirin/plavix - Will  start to slowly lower BP today, coreg started by cardiology team - Will need to discuss with neurology regarding starting heparin vs. Waiting to start eliquis after further cardiac work up.  - Will need better DM control and statin started  Cardiomyopathy, new HFrEF, acute - Currently well compensated, no volume overload - Carvedilol and losartan started  - Continue to slowly add on GDMT as BP allows - Limited TTE per Cards today  - Possible need for TEE, see above - Continue crestor  Diabetes Mellitus - Hgb A1C 14.2 - CBG ranging from 110s - 280s - On Semglee 15 units and SSI - Received 11 units of SSI yesterday - Will increase semglee to 20 units (home long acting 30 units) - Continue Jardiance  HTN - BP 145/92 - Continue HCTZ, losartan.  Coreg added today - Continue to slowly bring to normotension after stroke.   Subacute on chronic microcytic anemia, no blood loss reported - On review of chart, anemia started during last hospitalization at [facility] and has been stable 7.5 - 8.5 since that time.  She has significant microcytosis, but relatively good iron stores.  Ferritin was very high on 5/19, but now down to a normal level.   - Monitor for continued decline in H/H - Likely okay to start anticoagulation, will discuss with neurology  Human immunodeficiency virus (HIV) disease  - Continue home medication -  ID recommended checking HIV viral load, will order    Postprocedural intraabdominal abscess - ID consulted and already signed off - Abx changed to ciprofloxacin and flagyl for 2 weeks after most recent drain change  Dispo: Anticipated discharge in approximately 2-3 day(s).   Inez Catalina, MD 07/21/2021, 7:55 AM

## 2021-07-21 NOTE — Progress Notes (Signed)
Progress Note  Patient Name: Ashley Vaughn Date of Encounter: 07/21/2021  [facility] Cardiologist: None   Subjective   Denies shortness of breath, able to lie fully supine in bed without difficulty.  Blood pressure remains markedly elevated. Repeat echocardiogram with Definity contrast does not show any evidence of left ventricular thrombus.  It confirms the initial impression that LV systolic function is not quite as severely decreased as estimated on the initial echo, EF 30-35%.  There is global hypokinesis, no distinct regional wall motion abnormalities.  Inpatient Medications    Scheduled Meds:  aspirin EC  81 mg Oral Daily   carvedilol  3.125 mg Oral BID WC   ciprofloxacin  750 mg Oral BID   clopidogrel  75 mg Oral Daily   DULoxetine  60 mg Oral Daily   empagliflozin  10 mg Oral Daily   emtricitabine-rilpivir-tenofovir AF  1 tablet Oral Q breakfast   heparin  5,000 Units Subcutaneous Q8H   hydrochlorothiazide  12.5 mg Oral Daily   insulin aspart  0-15 Units Subcutaneous TID WC   insulin aspart  0-5 Units Subcutaneous QHS   [START ON 07/22/2021] insulin glargine-yfgn  20 Units Subcutaneous Daily   levETIRAcetam  1,000 mg Oral BID   losartan  50 mg Oral Daily   metFORMIN  500 mg Oral BID WC   metroNIDAZOLE  500 mg Oral Q12H   potassium chloride  40 mEq Oral BID   rosuvastatin  20 mg Oral Daily   sodium chloride flush  5 mL Intracatheter Q8H   Continuous Infusions:  PRN Meds: acetaminophen, LORazepam, polyethylene glycol   Vital Signs    Vitals:   07/20/21 2334 07/21/21 0327 07/21/21 0822 07/21/21 1237  BP: (!) 170/102 (!) 160/99 (!) 145/92 131/86  Pulse: (!) 110 (!) 103 100 (!) 102  Resp: 18 20 17 16   Temp: 98.9 F (37.2 C) 98 F (36.7 C) 98.4 F (36.9 C) 98.4 F (36.9 C)  TempSrc: Oral Oral Oral Oral  SpO2: 94% 97% 98% 99%  Weight:      Height:        Intake/Output Summary (Last 24 hours) at 07/21/2021 1302 Last data filed at 07/21/2021 1120 Gross per 24  hour  Intake 1009 ml  Output 20 ml  Net 989 ml      07/19/2021    4:17 PM 07/18/2021    8:30 AM 05/09/2021    2:47 PM  Last 3 Weights  Weight (lbs) 140 lb 141 lb 143 lb 9.6 oz  Weight (kg) 63.504 kg 63.957 kg 65.137 kg      Telemetry    Normal sinus rhythm- Personally Reviewed  ECG    No new tracing- Personally Reviewed  Physical Exam  Appears comfortable, lying fully supine in bed GEN: No acute distress.   Neck: No JVD Cardiac: RRR, no murmurs, rubs, or gallops.  Respiratory: Clear to auscultation bilaterally. GI: Soft, nontender, non-distended  MS: No edema; No deformity. Neuro:  Nonfocal  Psych: Normal affect   Labs    High Sensitivity Troponin:   Recent Labs  Lab 07/18/21 0010 07/18/21 0317  TROPONINIHS 10 16     Chemistry Recent Labs  Lab 07/18/21 1831 07/19/21 0359 07/20/21 0347 07/21/21 0037  NA 140 140 140 138  K 3.2* 2.9* 4.3 3.9  CL 108 107 112* 106  CO2 22 26 22 24   GLUCOSE 324* 257* 155* 118*  BUN 9 6 6 6   CREATININE 0.89 0.81 0.81 1.03*  CALCIUM 8.7*  8.6* 8.3* 9.0  MG 1.9 1.9  --   --   PROT  --  6.5 6.3* 6.8  ALBUMIN  --  2.3* 2.1* 2.3*  AST  --  17 47* 35  ALT  --  16 28 30   ALKPHOS  --  116 129* 156*  BILITOT  --  0.2* 0.2* 0.2*  GFRNONAA >60 >60 >60 >60  ANIONGAP 10 7 6 8     Lipids  Recent Labs  Lab 07/19/21 0359  CHOL 148  TRIG 118  HDL 34*  LDLCALC 90  CHOLHDL 4.4    Hematology Recent Labs  Lab 07/19/21 0359 07/20/21 0347 07/21/21 0037  WBC 6.7 5.7 7.4  RBC 3.77* 3.33*  3.31* 3.68*  HGB 8.1* 7.2* 7.9*  HCT 26.7* 23.3* 25.5*  MCV 70.8* 70.0* 69.3*  MCH 21.5* 21.6* 21.5*  MCHC 30.3 30.9 31.0  RDW 16.8* 16.8* 17.0*  PLT 449* 411* 469*   Thyroid No results for input(s): TSH, FREET4 in the last 168 hours.  BNPNo results for input(s): BNP, PROBNP in the last 168 hours.  DDimer No results for input(s): DDIMER in the last 168 hours.   Radiology    IR Catheter Tube Change  Result Date: 07/19/2021 INDICATION:  Previous active appendectomy, postop abscess, status post percutaneous drain catheter placement. Recent CT shows incomplete evacuation of the complex residual abscess. Drainage vision requested. EXAM: EXCHANGE OF ABDOMINAL DRAIN CATHETER UNDER FLUOROSCOPY MEDICATIONS: no periprocedural antibiotics were indicated ANESTHESIA/SEDATION: Lidocaine 1% subcutaneous COMPLICATIONS: None immediate. PROCEDURE: Informed written consent was obtained from the patient after a thorough discussion of the procedural risks, benefits and alternatives. All questions were addressed. Maximal Sterile Barrier Technique was utilized including caps, mask, sterile gowns, sterile gloves, sterile drape, hand hygiene and skin antiseptic. A timeout was performed prior to the initiation of the procedure. The previously placed right lower quadrant catheter and surrounding skin were prepped with Betadine, draped in usual sterile fashion. 1% lidocaine was infiltrated subcutaneously around the catheter. Small gentle contrast injection opacifies a small right lower quadrant residual abscess cavity. No fistula to bowel was identified. The catheter was removed over an Amplatz wire and a new 12 French pigtail drain catheter was advanced and formed centrally within the collection. Several mL of thin purulent material spontaneously returned out the catheter. The catheter was secured to the skin with 0 Prolene suture, and placed to gravity drain bag. The patient tolerated the procedure well. IMPRESSION: 1. Technically successful exchange and upsizing of right lower quadrant pigtail drain catheter. Electronically Signed   By: Lucrezia Europe M.D.   On: 07/19/2021 16:21   ECHOCARDIOGRAM LIMITED  Result Date: 07/21/2021    ECHOCARDIOGRAM LIMITED REPORT   Patient Name:   KAYLEIGHA SLAGHT Knape Date of Exam: 07/21/2021 Medical Rec #:  [id number]   Height:       62.0 in Accession #:    [id number]  Weight:       140.0 lb Date of Birth:  [birth year]   BSA:          1.643 m Patient  Age:    56 years    BP:           145/92 mmHg Patient Gender: F           HR:           95 bpm. Exam Location:  Inpatient Procedure: Limited Echo and Intracardiac Opacification Agent Indications:    Stroke i63.9  History:  Patient has prior history of Echocardiogram examinations, most                 recent 07/19/2021. Cardiomyopathy; Risk Factors:Hypertension,                 Diabetes and HIV.  Sonographer:    Raquel Sarna Senior RDCS Referring Phys: [phone] Yen Wandell  Sonographer Comments: Limited to evaluate for LV thrombus IMPRESSIONS  1. No evidence of left ventricular thrombus with Definity contrast. Left ventricular ejection fraction, by estimation, is 30 to 35%. The left ventricle has moderately decreased function. The left ventricle demonstrates global hypokinesis. Conclusion(s)/Recommendation(s): Limited study for LV thrombus. FINDINGS  Left Ventricle: No evidence of left ventricular thrombus with Definity contrast. Left ventricular ejection fraction, by estimation, is 30 to 35%. The left ventricle has moderately decreased function. The left ventricle demonstrates global hypokinesis. Definity contrast agent was given IV to delineate the left ventricular endocardial borders. Sanda Klein MD Electronically signed by Sanda Klein MD Signature Date/Time: 07/21/2021/1:02:26 PM    Final     Cardiac Studies   Echocardiogram 07/21/2021  1. No evidence of left ventricular thrombus with Definity contrast. Left ventricular ejection fraction, by estimation, is 30 to 35%. The left ventricle has moderately decreased function. The left ventricle demonstrates global hypokinesis.   Patient Profile     56 y.o. female with history of poorly controlled diabetes mellitus and hypertension, chronic HIV infection on antiretroviral therapies, anemia, intra-abdominal abscess following recent appendectomy, requiring percutaneous drain, admitted with seizure and found to have evidence of acute ischemic infarct scattered in  the subcortical right cerebral hemisphere, suspicious for embolic event.  Noted to have severely depressed left ventricular systolic function on echocardiography, but without clinically evident heart failure.  Also noted to have a 60% right internal carotid artery ostial stenosis and moderate stenosis of the left internal carotid artery cavernous segment.  Assessment & Plan    No clinical evidence of heart failure. Tolerating institution of beta-blocker therapy without heart failure decompensation so far. Blood pressure remains severely elevated and we are now roughly 3 days from the initial ischemic cerebral insult so we will start increasing antihypertensive medications. Left ventricular thrombus is not seen on transthoracic echocardiography with Definity contrast and left ventricular wall motion is not as bad as initially evaluated, unlikely to develop LV thrombus under the circumstances. Plan TEE to assess for alternate cardioembolic source next week.  We will also need coronary CT angiogram to assess for possible ischemic etiology of her cardiomyopathy, but need to get her heart rate down to the 60s before we can get adequate images. Increase carvedilol today.  Increase losartan starting tomorrow.     For questions or updates, please contact [place] Please consult [email or web address] for contact info under        Signed, Sanda Klein, MD  07/21/2021, 1:02 PM

## 2021-07-21 NOTE — Progress Notes (Signed)
STROKE TEAM PROGRESS NOTE   INTERVAL HISTORY Patient is lying comfortably in bed pt no acute event overnight, neuro intact. Pt found to have EF < 20%, cardiology on board, recommend TEE. Hb now 7.9 from yesterday 7.2,  will need monitoring.  Primary team ordering anemia work-up.  Vital signs stable.  Neuro exam unchanged  Vitals:   07/20/21 2010 07/20/21 2334 07/21/21 0327 07/21/21 0822  BP: (!) 164/96 (!) 170/102 (!) 160/99 (!) 145/92  Pulse: (!) 109 (!) 110 (!) 103 100  Resp: 20 18 20 17   Temp: 98.4 F (36.9 C) 98.9 F (37.2 C) 98 F (36.7 C) 98.4 F (36.9 C)  TempSrc: Oral Oral Oral Oral  SpO2: 99% 94% 97% 98%  Weight:      Height:       CBC:  Recent Labs  Lab 07/19/21 0359 07/20/21 0347 07/21/21 0037  WBC 6.7 5.7 7.4  NEUTROABS 3.0 2.3  --   HGB 8.1* 7.2* 7.9*  HCT 26.7* 23.3* 25.5*  MCV 70.8* 70.0* 69.3*  PLT 449* 411* [id number]*   Basic Metabolic Panel:  Recent Labs  Lab 07/18/21 1831 07/19/21 0359 07/20/21 0347 07/21/21 0037  NA 140 140 140 138  K 3.2* 2.9* 4.3 3.9  CL 108 107 112* 106  CO2 22 26 22 24   GLUCOSE 324* 257* 155* 118*  BUN 9 6 6 6   CREATININE 0.89 0.81 0.81 1.03*  CALCIUM 8.7* 8.6* 8.3* 9.0  MG 1.9 1.9  --   --   PHOS  --  3.2  --   --    Lipid Panel:  Recent Labs  Lab 07/19/21 0359  CHOL 148  TRIG 118  HDL 34*  CHOLHDL 4.4  VLDL 24  LDLCALC 90   HgbA1c:  Recent Labs  Lab 07/19/21 0359  HGBA1C 14.2*   Urine Drug Screen: No results for input(s): LABOPIA, COCAINSCRNUR, LABBENZ, AMPHETMU, THCU, LABBARB in the last 168 hours.  Alcohol Level No results for input(s): ETH in the last 168 hours.  IMAGING past 24 hours No results found.  PHYSICAL EXAM  Physical Exam  Constitutional: Appears well-developed and well-nourished middle-age African-American lady.  Cardiovascular: Normal rate and regular rhythm.  Respiratory: Effort normal, non-labored breathing  Neuro: Mental Status: Patient is awake, alert, oriented to person, place,  month, year, and situation. Patient is able to give a clear and coherent history. No signs of aphasia or neglect Cranial Nerves: II: Visual Fields are full. Pupils are equal, round, and reactive to light.   III,IV, VI: EOMI without ptosis or diploplia. Gaze is midline. V: Facial sensation is symmetric to temperature VII: Facial movement is symmetric resting and smiling VIII: Hearing is intact to voice X: Palate elevates symmetrically XI: Shoulder shrug is symmetric. XII: Tongue protrudes midline without atrophy or fasciculations.  Motor: Tone is normal. Bulk is normal. 5/5 strength was present in all four extremities.  Sensory: Sensation is symmetric to light touch and temperature in the arms and legs. No extinction to DSS present.  Cerebellar: FNF and HKS are intact bilaterally   ASSESSMENT/PLAN Ashley Vaughn is a 56 y.o. female with history of  hypertension, type 2 diabetes, HIV, HSV, recent appendectomy complicated by intra-abdominal abscess requiring percutaneous drainage and antibiotics, who presented to [facility] ED due to seizure-like activity at home and had one episode in ED. She was also in the ED 5/30 for a syncopal episode preceded by nausea and vomiting. MRI shows scattered acute ischemic infarcts involving the subcortical right cerebral  hemisphere. No focal neurological deficits.   Stroke:  right occipital WM, right MCA/ACA 3 small acute infarcts, etiology unclear, concerning for cardioembolic source from low EF, especially with potential nonocclusive thrombus at R ICA terminus Code Stroke CT head No acute abnormality.  CTA head & neck- positive for questionable filling defect - nonocclusive thrombus in the supraclinoid Right ICA. Underlying age advanced atherosclerosis at both carotid bifurcations and ICA siphons. 60% Right ICA origin stenosis (at most 40% on my calculation). Moderate stenosis of the Left ICA cavernous segment MRI  Few scattered subcentimeter acute ischemic  infarcts involving the subcortical right cerebral hemisphere as above.  2D Echo EF < 20% Vas duplex DVT- no evidence of DVT LDL 90 HgbA1c 14.2 UDS pending VTE prophylaxis - SCDs No antithrombotic prior to admission, now on aspirin 81 mg daily and clopidogrel 75 mg daily. May need to consider anticoagulation if TEE shows a clot Therapy recommendations:  Pending Disposition:  Pending  Seizure like activity  Neck shaking lasting 10 min per pt without LOC Inconsistent report of arm shaking EEG-independent bilateral temporal slowing, no seizure S/p keppra loading Now on keppra 1000mg  bid   Cardiomyopathy  TTE showed EF < 20% Cardiology on board Await TEE to rule out LV thrombus Given low EF, embolic strokes and nonocclusive ICA termius thrombus, would have recommended empirical anticoagulation. Ideally with eliquis as coumadin may interfere with her HIV meds. However, currently severe anemic, not good candidate for [facility]. May consider AC once anemia stable or if TEE shows a definite clot.  Hypertension Stable on the high end Gradually normalized BP in 2-3 days Long-term BP goal normotensive  Hyperlipidemia LDL 90, goal < 70 Add Crestor 20mg   Continue statin at discharge  Diabetes type II Uncontrolled Home meds:  Insulin HgbA1c 14.2, goal < 7.0 CBGs hyperglycemia SSI Close PCP follow up for better DM control  Anemia  Hb 8.1->7.2 Could be dilutional No AC for now unless TEE shows a definite clot  off IVF  Other Active Problems Hypokalemia K 2.9 -> supplement HIV on HARRT - reported in good control, ID on board Abdominal abscess CT Abd- Patient is status post appendectomy. Enhancing fluid collection containing air in the right paracolic gutter extending into the adjacent intramuscular wall consistent with abscess. Percutaneous drainage catheter in place within this collection. Gen surgery consulted for management  Augmentin and cipro at home -> now on flagyl and cipro - ID  on board  Hospital day # 3  Patient presented with multiple small l embolic strokes with possible small clot in terminal right ICA and echo shows very low EF raising strong possibility of cardiogenic embolism however given his significant anemia will hold off on anticoagulation for now and continue antiplatelet therapy alone.  Discussed with Dr. Sallyanne Kuster cardiology and requested to arrange for TEE on Monday.  If TEE shows a definite clot may reconsider anticoagulation.  Discussed with Dr. Daryll Drown and answered questions.  Greater than 50% time during this 35-minute visit was spent in counseling and coordination of care and discussion with patient and care team and answering questions about her embolic stroke and stroke prevention Antony Contras, MD To contact Stroke Continuity provider, please refer to [email or web address] After hours, contact General Neurology

## 2021-07-21 NOTE — Progress Notes (Signed)
Echocardiogram 2D Echocardiogram has been performed.  Warren Lacy Oscar Hank RDCS 07/21/2021, 10:49 AM

## 2021-07-22 ENCOUNTER — Encounter: Payer: Self-pay | Admitting: Infectious Diseases

## 2021-07-22 DIAGNOSIS — Z794 Long term (current) use of insulin: Secondary | ICD-10-CM

## 2021-07-22 DIAGNOSIS — R569 Unspecified convulsions: Secondary | ICD-10-CM | POA: Diagnosis not present

## 2021-07-22 DIAGNOSIS — I63131 Cerebral infarction due to embolism of right carotid artery: Secondary | ICD-10-CM | POA: Diagnosis not present

## 2021-07-22 DIAGNOSIS — B20 Human immunodeficiency virus [HIV] disease: Secondary | ICD-10-CM | POA: Diagnosis not present

## 2021-07-22 DIAGNOSIS — E139 Other specified diabetes mellitus without complications: Secondary | ICD-10-CM

## 2021-07-22 DIAGNOSIS — I429 Cardiomyopathy, unspecified: Secondary | ICD-10-CM | POA: Diagnosis not present

## 2021-07-22 LAB — GLUCOSE, CAPILLARY
Glucose-Capillary: 96 mg/dL (ref 70–99)
Glucose-Capillary: 108 mg/dL — ABNORMAL HIGH (ref 70–99)
Glucose-Capillary: 73 mg/dL (ref 70–99)
Glucose-Capillary: 89 mg/dL (ref 70–99)

## 2021-07-22 LAB — COMPREHENSIVE METABOLIC PANEL
ALT: 24 U/L (ref 0–44)
AST: 26 U/L (ref 15–41)
Albumin: 2.3 g/dL — ABNORMAL LOW (ref 3.5–5.0)
Alkaline Phosphatase: 142 U/L — ABNORMAL HIGH (ref 38–126)
Anion gap: 6 (ref 5–15)
BUN: 8 mg/dL (ref 6–20)
CO2: 24 mmol/L (ref 22–32)
Calcium: 8.8 mg/dL — ABNORMAL LOW (ref 8.9–10.3)
Chloride: 107 mmol/L (ref 98–111)
Creatinine, Ser: 1.22 mg/dL — ABNORMAL HIGH (ref 0.44–1.00)
GFR, Estimated: 52 mL/min — ABNORMAL LOW (ref >60)
Glucose, Bld: 85 mg/dL (ref 70–99)
Potassium: 5 mmol/L (ref 3.5–5.1)
Sodium: 137 mmol/L (ref 135–145)
Total Bilirubin: 0.3 mg/dL (ref 0.3–1.2)
Total Protein: 6.5 g/dL (ref 6.5–8.1)

## 2021-07-22 LAB — CBC
HCT: 26.2 % — ABNORMAL LOW (ref 36.0–46.0)
Hemoglobin: 8.2 g/dL — ABNORMAL LOW (ref 12.0–15.0)
MCH: 21.8 pg — ABNORMAL LOW (ref 26.0–34.0)
MCHC: 31.3 g/dL (ref 30.0–36.0)
MCV: 69.5 fL — ABNORMAL LOW (ref 80.0–100.0)
Platelets: 428 10*3/uL — ABNORMAL HIGH (ref 150–400)
RBC: 3.77 MIL/uL — ABNORMAL LOW (ref 3.87–5.11)
RDW: 17.1 % — ABNORMAL HIGH (ref 11.5–15.5)
WBC: 5.2 10*3/uL (ref 4.0–10.5)
nRBC: 0.4 % — ABNORMAL HIGH (ref 0.0–0.2)

## 2021-07-22 MED ORDER — CARVEDILOL 12.5 MG PO TABS
12.5000 mg | ORAL_TABLET | Freq: Two times a day (BID) | ORAL | Status: DC
  Administered 2021-07-22 – 2021-07-23 (×2): 12.5 mg via ORAL
  Filled 2021-07-22 (×2): qty 1

## 2021-07-22 NOTE — Progress Notes (Signed)
Progress Note  Patient Name: Ashley Vaughn Date of Encounter: 07/22/2021  [facility] Cardiologist: None   Subjective   No cardiovascular complaints.  No new neurological events.  Inpatient Medications    Scheduled Meds:  aspirin EC  81 mg Oral Daily   carvedilol  12.5 mg Oral BID WC   ciprofloxacin  750 mg Oral BID   clopidogrel  75 mg Oral Daily   DULoxetine  60 mg Oral Daily   empagliflozin  10 mg Oral Daily   emtricitabine-rilpivir-tenofovir AF  1 tablet Oral Q breakfast   heparin  5,000 Units Subcutaneous Q8H   hydrochlorothiazide  12.5 mg Oral Daily   insulin aspart  0-15 Units Subcutaneous TID WC   insulin aspart  0-5 Units Subcutaneous QHS   insulin glargine-yfgn  20 Units Subcutaneous Daily   levETIRAcetam  1,000 mg Oral BID   losartan  100 mg Oral Daily   metFORMIN  500 mg Oral BID WC   metroNIDAZOLE  500 mg Oral Q12H   potassium chloride  40 mEq Oral BID   rosuvastatin  20 mg Oral Daily   sodium chloride flush  5 mL Intracatheter Q8H   Continuous Infusions:  PRN Meds: acetaminophen, LORazepam, polyethylene glycol   Vital Signs    Vitals:   07/21/21 2321 07/22/21 0325 07/22/21 0924 07/22/21 1223  BP: 136/85 131/77 115/73 128/78  Pulse: 96 86 83 87  Resp: 18 18 18 15   Temp: 98.7 F (37.1 C) 98.2 F (36.8 C) 98.2 F (36.8 C) 98.2 F (36.8 C)  TempSrc: Oral Oral Oral Oral  SpO2: 100% 96% 99% 100%  Weight:      Height:        Intake/Output Summary (Last 24 hours) at 07/22/2021 1338 Last data filed at 07/22/2021 1300 Gross per 24 hour  Intake 356 ml  Output 25 ml  Net 331 ml      07/19/2021    4:17 PM 07/18/2021    8:30 AM 05/09/2021    2:47 PM  Last 3 Weights  Weight (lbs) 140 lb 141 lb 143 lb 9.6 oz  Weight (kg) 63.504 kg 63.957 kg 65.137 kg      Telemetry    Normal sinus rhythm- Personally Reviewed  ECG    No new tracing- Personally Reviewed  Physical Exam  Lying fully horizontally, no respiratory difficulty GEN: No acute  distress.   Neck: No JVD Cardiac: RRR, no murmurs, rubs, or gallops.  Respiratory: Clear to auscultation bilaterally. GI: Soft, nontender, non-distended  MS: No edema; No deformity. Neuro:  Nonfocal  Psych: Normal affect   Labs    High Sensitivity Troponin:   Recent Labs  Lab 07/18/21 0010 07/18/21 0317  TROPONINIHS 10 16     Chemistry Recent Labs  Lab 07/18/21 1831 07/19/21 0359 07/20/21 0347 07/21/21 0037 07/22/21 0232  NA 140 140 140 138 137  K 3.2* 2.9* 4.3 3.9 5.0  CL 108 107 112* 106 107  CO2 22 26 22 24 24   GLUCOSE 324* 257* 155* 118* 85  BUN 9 6 6 6 8   CREATININE 0.89 0.81 0.81 1.03* 1.22*  CALCIUM 8.7* 8.6* 8.3* 9.0 8.8*  MG 1.9 1.9  --   --   --   PROT  --  6.5 6.3* 6.8 6.5  ALBUMIN  --  2.3* 2.1* 2.3* 2.3*  AST  --  17 47* 35 26  ALT  --  16 28 30 24   ALKPHOS  --  116 129* 156*  142*  BILITOT  --  0.2* 0.2* 0.2* 0.3  GFRNONAA >60 >60 >60 >60 52*  ANIONGAP 10 7 6 8 6     Lipids  Recent Labs  Lab 07/19/21 0359  CHOL 148  TRIG 118  HDL 34*  LDLCALC 90  CHOLHDL 4.4    Hematology Recent Labs  Lab 07/20/21 0347 2021-08-08 0037 2021-08-08 1407 07/22/21 0232  WBC 5.7 7.4  --  5.2  RBC 3.33*  3.31* 3.68* 3.72* 3.77*  HGB 7.2* 7.9*  --  8.2*  HCT 23.3* 25.5*  --  26.2*  MCV 70.0* 69.3*  --  69.5*  MCH 21.6* 21.5*  --  21.8*  MCHC 30.9 31.0  --  31.3  RDW 16.8* 17.0*  --  17.1*  PLT 411* 469*  --  428*   Thyroid No results for input(s): TSH, FREET4 in the last 168 hours.  BNPNo results for input(s): BNP, PROBNP in the last 168 hours.  DDimer No results for input(s): DDIMER in the last 168 hours.   Radiology    ECHOCARDIOGRAM LIMITED  Result Date: 2021/08/08    ECHOCARDIOGRAM LIMITED REPORT   Patient Name:   Ashley Vaughn Date of Exam: 08/08/2021 Medical Rec #:  09/20/2021   Height:       62.0 in Accession #:    [id number]  Weight:       140.0 lb Date of Birth:  May 17, 1965   BSA:          1.643 m Patient Age:    56 years    BP:           145/92 mmHg  Patient Gender: F           HR:           95 bpm. Exam Location:  Inpatient Procedure: Limited Echo and Intracardiac Opacification Agent Indications:    Stroke i63.9  History:        Patient has prior history of Echocardiogram examinations, most                 recent 07/19/2021. Cardiomyopathy; Risk Factors:Hypertension,                 Diabetes and HIV.  Sonographer:    09/18/2021 Senior RDCS Referring Phys: [phone] Maxey Ransom  Sonographer Comments: Limited to evaluate for LV thrombus IMPRESSIONS  1. No evidence of left ventricular thrombus with Definity contrast. Left ventricular ejection fraction, by estimation, is 30 to 35%. The left ventricle has moderately decreased function. The left ventricle demonstrates global hypokinesis. Conclusion(s)/Recommendation(s): Limited study for LV thrombus. FINDINGS  Left Ventricle: No evidence of left ventricular thrombus with Definity contrast. Left ventricular ejection fraction, by estimation, is 30 to 35%. The left ventricle has moderately decreased function. The left ventricle demonstrates global hypokinesis. Definity contrast agent was given IV to delineate the left ventricular endocardial borders. [id number] MD Electronically signed by Thurmon Fair MD Signature Date/Time: August 08, 2021/1:02:26 PM    Final     Cardiac Studies   ECHOCARDIOGRAM LIMITED 08-Aug-2021 1. No evidence of left ventricular thrombus with Definity contrast. Left ventricular ejection fraction, by estimation, is 30 to 35%. The left ventricle has moderately decreased function. The left ventricle demonstrates global hypokinesis. Conclusion(s)/Recommendation(s): Limited study for LV thrombus.  Patient Profile     56 y.o. female with history of poorly controlled diabetes mellitus and hypertension, chronic HIV infection on antiretroviral therapies, anemia, intra-abdominal abscess following recent appendectomy, requiring percutaneous drain, admitted with seizure and found  to have evidence of acute ischemic  infarct scattered in the subcortical right cerebral hemisphere, suspicious for embolic event.  Noted to have severely depressed left ventricular systolic function on echocardiography, but without clinically evident heart failure.  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  Assessment & Plan    Cardiomyopathy/systolic HF: Appears well compensated hemodynamically/euvolemic.  No symptoms of left heart failure.  Etiology uncertain, but suspect insufficiently treated hypertension may have a lot to do with it.  Has multiple risk factors for CAD (including uncontrolled diabetes mellitus), but does not have angina or ECG changes suggestive of ischemia. Heart rate remains too fast to get good images on coronary CT angiogram.  We will increase the carvedilol dose to 12.5 mg twice daily.  She is already on maximum dose of losartan.  [facility] and eventually add spironolactone and SGLT2 inhibitor. Embolic stroke: Stroke service requests TEE for potential cardioembolic source of stroke.  The schedule tomorrow is full, but if a cancellation occurs we will get the TEE performed tomorrow.  This procedure has been fully reviewed with the patient and written informed consent has been obtained.  We will make her n.p.o. past midnight in case there is a cancellation, but if the procedure cannot be done tomorrow, please restart her regular diet. HIV: On antiretroviral therapy.  Undetectable viral load as of March 2023.  Normal CD4 count 1447. Anemia: Microcytic, but with normal iron stores by labs.  Microcytosis is a chronic finding dating back more than 10 years, consistent with thalassemia trait, but baseline hemoglobin seems to be around 12. DM: Poorly controlled Appendicitis complicated by retrocecal abscess with indwelling drain     For questions or updates, please contact [facility] Please  consult [email or web address] for contact info under        Signed, Thurmon FairMihai Dionisia Pacholski, MD  07/22/2021, 1:38 PM

## 2021-07-22 NOTE — Progress Notes (Addendum)
NAME:  Ashley Vaughn, MRN:  [id number], DOB:  11-07-1965, LOS: 4 ADMISSION DATE:  07/18/2021  Subjective  Patient evaluated at bedside this AM. She reports she is doing okay overnight without issues. No leakage from drain. Able to get up and walk the halls yesterday, having daily BM.  Objective   Blood pressure 128/78, pulse 87, temperature 98.2 F (36.8 C), temperature source Oral, resp. rate 15, height 5\' 2"  (1.575 m), weight 63.5 kg, SpO2 100 %.     Intake/Output Summary (Last 24 hours) at 07/22/2021 1305 Last data filed at 07/22/2021 1100 Gross per 24 hour  Intake 351 ml  Output --  Net 351 ml   Filed Weights   07/19/21 1617  Weight: 63.5 kg   Physical Exam: General: Resting comfortably in bed, no acute distress CV: Regular rate, rhythm. No murmurs appreciated. Distal pulses 2+ bilaterally. No JVD appreciated. Pulm: Normal respiratory effort on room air. Clear to ausculation bilaterally. Abdomen: Soft, non-tender, non-distended. Normoactive bowel sounds.  MSK: Normal bulk, tone. No peripheral edema appreciated.  Neuro: Awake, alert, conversing appropriately. Flattening of R nasolabial fold. Mild R tongue deviation. Moving all four extremities without difficulty. Psych: Flat affect. Normal mood.  Labs       Latest Ref Rng & Units 07/22/2021    2:32 AM 07/21/2021   12:37 AM 07/20/2021    3:47 AM  CBC  WBC 4.0 - 10.5 K/uL 5.2   7.4   5.7    Hemoglobin 12.0 - 15.0 g/dL 8.2   7.9   7.2    Hematocrit 36.0 - 46.0 % 26.2   25.5   23.3    Platelets 150 - 400 K/uL 428   469   411        Latest Ref Rng & Units 07/22/2021    2:32 AM 07/21/2021   12:37 AM 07/20/2021    3:47 AM  BMP  Glucose 70 - 99 mg/dL 85   118   155    BUN 6 - 20 mg/dL 8   6   6     Creatinine 0.44 - 1.00 mg/dL 1.22   1.03   0.81    Sodium 135 - 145 mmol/L 137   138   140    Potassium 3.5 - 5.1 mmol/L 5.0   3.9   4.3    Chloride 98 - 111 mmol/L 107   106   112    CO2 22 - 32 mmol/L 24   24   22     Calcium 8.9 - 10.3  mg/dL 8.8   9.0   8.3      Summary  Ashley Vaughn is 56yo with T2DM, HIV, HTN, recent appendectomy c/b intra-abdominal abscess s/p percutaneous drain insertion admitted 6/1 for seizure-like activity and was found to have CVA concerning for embolic etiology, during work-up found to have new reduced EF <20%, now hemodynamically stable and titrating GDMT.  Assessment & Plan:  Principal Problem:   Seizure ([place]) Active Problems:   Human immunodeficiency virus (HIV) disease ([place])   Postprocedural intraabdominal abscess   CVA (cerebral vascular accident) ([place])   Cardiomyopathy ([place])  #Embolic CVA Stroke team following, appreciate their assistance. Currently on DAPT, likely will need anticoagulation given embolic etiology of stroke. She does have evidence of atherosclerotic disease on imaging, likely could benefit from at least one antiplatelet moving forward. Limited TTE yesterday without LV thrombus, TEE pending tomorrow for further assessment. Started to lower blood pressure yesterday, normotensive this morning.  -  Aspirin, Plavix daily - Transition to anticoagulation after cardiac work-up? - Consider continued use of antiplatelet with atherosclerotic disease - Continue statin, LDL 90 - Will need good follow-up w/ [facility] for diabetes management  #Seizure-like activity No previous seizures. Unclear if actual seizure? EEG on arrival w/o seizure activity. Will continue Keppra, need to clarify with neurology if Keppra is still needed at discharge.  - Continue Keppra - Discuss with neurology d/c'ing Keppra at discharge?  #Newly diagnosed heart failure with reduced EF Appears euvolemic on exam. Yesterday started lowering BP with carvedilol and losartan - normotensive this morning. Minor bump in creatinine likely related to ARB, will continue checking electrolytes. Will plan for TEE per cardiology tomorrow. Will also need ischemic evaluation with cardiology.  - Carvedilol 6.25mg  twice daily - Losartan  100mg  daily - Empagliflozin 10mg  daily - Pending TEE tomorrow - Needs ischemic evaluation - CT angio? - Strict I/O, daily weights - Daily BMP  #Subacute microcytic anemia Hgb stable, has been rising over last three days. -Daily CBC  #Postsurgical intra-abdominal abscess Still has some drainage, but appears stable. Continue with antibiotics for two weeks per ID. - Continue daily ciprofloxacin, metronidazole x2 weeks  #Type II diabetes mellitus Sugars have much improved after adjustment of insulin yesterday. Will continue with current regimen. - Semglee 20u daily - SSI  #Nonocclusive thrombus R ICA #Bilateral carotid stenosis No further imaging needed, thrombus is intracranial. R ICA w/ 60% stenosis, moderate stenosis on L ICA. No further work-up needed at this time.  #HIV - Continue daily home [place]  #HTN Normotensive today, will continue with same medications. - HCTZ, losartan, carvedilol  Best practice:  DIET: CM, NPO@MN  IVF: n/a DVT PPX: heparin BOWEL: n/a CODE: FULL FAM COM: n/a  Sanjuan Dame, MD Internal Medicine Resident PGY-2 PAGER: [phone] 07/22/2021 1:05 PM  If after hours (below), please contact on-call pager: [phone] 5PM-7AM Monday-Friday 1PM-7AM Saturday-Sunday

## 2021-07-22 NOTE — Progress Notes (Signed)
STROKE TEAM PROGRESS NOTE   INTERVAL HISTORY Patient is lying comfortably in bed pt no acute event overnight, neuro intact.  .  Vital signs stable.  Neuro exam unchanged.  Cardiology planning to do TEE tomorrow.  Hemoglobin is stable at 8.2.  Admits to snoring and has not been evaluated for sleep apnea and is willing to consider participation.  She was given written information to review and decide.  Vitals:   07/21/21 2321 07/22/21 0325 07/22/21 0924 07/22/21 1223  BP: 136/85 131/77 115/73 128/78  Pulse: 96 86 83 87  Resp: 18 18 18 15   Temp: 98.7 F (37.1 C) 98.2 F (36.8 C) 98.2 F (36.8 C) 98.2 F (36.8 C)  TempSrc: Oral Oral Oral Oral  SpO2: 100% 96% 99% 100%  Weight:      Height:       CBC:  Recent Labs  Lab 07/19/21 0359 07/20/21 0347 07/21/21 0037 07/22/21 0232  WBC 6.7 5.7 7.4 5.2  NEUTROABS 3.0 2.3  --   --   HGB 8.1* 7.2* 7.9* 8.2*  HCT 26.7* 23.3* 25.5* 26.2*  MCV 70.8* 70.0* 69.3* 69.5*  PLT 449* 411* 469* 428*   Basic Metabolic Panel:  Recent Labs  Lab 07/18/21 1831 07/19/21 0359 07/20/21 0347 07/21/21 0037 07/22/21 0232  NA 140 140   < > 138 137  K 3.2* 2.9*   < > 3.9 5.0  CL 108 107   < > 106 107  CO2 22 26   < > 24 24  GLUCOSE 324* 257*   < > 118* 85  BUN 9 6   < > 6 8  CREATININE 0.89 0.81   < > 1.03* 1.22*  CALCIUM 8.7* 8.6*   < > 9.0 8.8*  MG 1.9 1.9  --   --   --   PHOS  --  3.2  --   --   --    < > = values in this interval not displayed.   Lipid Panel:  Recent Labs  Lab 07/19/21 0359  CHOL 148  TRIG 118  HDL 34*  CHOLHDL 4.4  VLDL 24  LDLCALC 90   HgbA1c:  Recent Labs  Lab 07/19/21 0359  HGBA1C 14.2*   Urine Drug Screen: No results for input(s): LABOPIA, COCAINSCRNUR, LABBENZ, AMPHETMU, THCU, LABBARB in the last 168 hours.  Alcohol Level No results for input(s): ETH in the last 168 hours.  IMAGING past 24 hours No results found.  PHYSICAL EXAM  Physical Exam  Constitutional: Appears well-developed and well-nourished  middle-age African-American lady.  Cardiovascular: Normal rate and regular rhythm.  Respiratory: Effort normal, non-labored breathing  Neuro: Mental Status: Patient is awake, alert, oriented to person, place, month, year, and situation. Patient is able to give a clear and coherent history. No signs of aphasia or neglect Cranial Nerves: II: Visual Fields are full. Pupils are equal, round, and reactive to light.   III,IV, VI: EOMI without ptosis or diploplia. Gaze is midline. V: Facial sensation is symmetric to temperature VII: Facial movement is symmetric resting and smiling VIII: Hearing is intact to voice X: Palate elevates symmetrically XI: Shoulder shrug is symmetric. XII: Tongue protrudes midline without atrophy or fasciculations.  Motor: Tone is normal. Bulk is normal. 5/5 strength was present in all four extremities.  Sensory: Sensation is symmetric to light touch and temperature in the arms and legs. No extinction to DSS present.  Cerebellar: FNF and HKS are intact bilaterally   ASSESSMENT/PLAN Ms. GHADEER KASTELIC is  a 56 y.o. female with history of  hypertension, type 2 diabetes, HIV, HSV, recent appendectomy complicated by intra-abdominal abscess requiring percutaneous drainage and antibiotics, who [facility] due to seizure-like activity at home and had one episode in ED. She was also in the ED 5/30 for a syncopal episode preceded by nausea and vomiting. MRI shows scattered acute ischemic infarcts involving the subcortical right cerebral hemisphere. No focal neurological deficits.   Stroke:  right occipital WM, right MCA/ACA 3 small acute infarcts, etiology unclear, concerning for cardioembolic source from low EF, especially with potential nonocclusive thrombus at R ICA terminus Code Stroke CT head No acute abnormality.  CTA head & neck- positive for questionable filling defect - nonocclusive thrombus in the supraclinoid Right ICA. Underlying age advanced atherosclerosis  at both carotid bifurcations and ICA siphons. 60% Right ICA origin stenosis (at most 40% on my calculation). Moderate stenosis of the Left ICA cavernous segment MRI  Few scattered subcentimeter acute ischemic infarcts involving the subcortical right cerebral hemisphere as above.  2D Echo EF < 20% Vas duplex DVT- no evidence of DVT LDL 90 HgbA1c 14.2 UDS pending VTE prophylaxis - SCDs No antithrombotic prior to admission, now on aspirin 81 mg daily and clopidogrel 75 mg daily. May need to consider anticoagulation if TEE shows a clot Therapy recommendations:  Pending Disposition:  Pending  Seizure like activity  Neck shaking lasting 10 min per pt without LOC Inconsistent report of arm shaking EEG-independent bilateral temporal slowing, no seizure S/p keppra loading Now on keppra 1000mg  bid   Cardiomyopathy  TTE showed EF < 20% Cardiology on board Await TEE to rule out LV thrombus Given low EF, embolic strokes and nonocclusive ICA termius thrombus, would have recommended empirical anticoagulation. Ideally with eliquis as coumadin may interfere with her HIV meds. However, currently severe anemic, not [facility]. May consider AC once anemia stable or if TEE shows a definite clot.  Hypertension Stable on the high end Gradually normalized BP in 2-3 days Long-term BP goal normotensive  Hyperlipidemia LDL 90, goal < 70 Add Crestor 20mg   Continue statin at discharge  Diabetes type II Uncontrolled Home meds:  Insulin HgbA1c 14.2, goal < 7.0 CBGs hyperglycemia SSI Close PCP follow up for better DM control  Anemia  Hb 8.1->7.2 Could be dilutional No AC for now unless TEE shows a definite clot  off IVF  Other Active Problems Hypokalemia K 2.9 -> supplement HIV on HARRT - reported in good control, ID on board Abdominal abscess CT Abd- Patient is status post appendectomy. Enhancing fluid collection containing air in the right paracolic gutter extending into the  adjacent intramuscular wall consistent with abscess. Percutaneous drainage catheter in place within this collection. Gen surgery consulted for management  Augmentin and cipro at home -> now on flagyl and cipro - ID on board  Hospital day # 4  Patient presented with multiple small l embolic strokes with possible small clot in terminal right ICA and echo shows very low EF raising strong possibility of cardiogenic embolism however given his significant anemia will hold off on anticoagulation for now and continue antiplatelet therapy alone.  Discussed with Dr. Antoine PocheHochrein  cardiology and requested to arrange for TEE on Monday.  If TEE shows a definite clot may reconsider anticoagulation.    Greater than 50% time during this 35-minute visit was spent in counseling and coordination of care and discussion with patient and care team and answering questions about her embolic stroke and stroke  prevention.  Patient given written information about sleep smart study to review and decide if she wants to participate. Delia Heady, MD To contact Stroke Continuity provider, please refer to [email or web address]. After hours, contact General Neurology

## 2021-07-23 ENCOUNTER — Inpatient Hospital Stay (HOSPITAL_COMMUNITY): Payer: Medicaid Other

## 2021-07-23 ENCOUNTER — Inpatient Hospital Stay (HOSPITAL_COMMUNITY): Payer: Medicaid Other | Admitting: Anesthesiology

## 2021-07-23 ENCOUNTER — Encounter (HOSPITAL_COMMUNITY): Payer: Self-pay | Admitting: Internal Medicine

## 2021-07-23 ENCOUNTER — Encounter (HOSPITAL_COMMUNITY)
Admission: EM | Disposition: A | Discharge status: Home / Self Care | Payer: Self-pay | Source: Home / Self Care | Attending: Internal Medicine

## 2021-07-23 DIAGNOSIS — I420 Dilated cardiomyopathy: Secondary | ICD-10-CM | POA: Diagnosis not present

## 2021-07-23 DIAGNOSIS — I10 Essential (primary) hypertension: Secondary | ICD-10-CM

## 2021-07-23 DIAGNOSIS — E119 Type 2 diabetes mellitus without complications: Secondary | ICD-10-CM

## 2021-07-23 DIAGNOSIS — Z7984 Long term (current) use of oral hypoglycemic drugs: Secondary | ICD-10-CM | POA: Diagnosis not present

## 2021-07-23 DIAGNOSIS — I639 Cerebral infarction, unspecified: Secondary | ICD-10-CM | POA: Diagnosis not present

## 2021-07-23 DIAGNOSIS — B20 Human immunodeficiency virus [HIV] disease: Secondary | ICD-10-CM | POA: Diagnosis not present

## 2021-07-23 DIAGNOSIS — R569 Unspecified convulsions: Secondary | ICD-10-CM | POA: Diagnosis not present

## 2021-07-23 HISTORY — PX: TEE WITHOUT CARDIOVERSION: SHX5443

## 2021-07-23 HISTORY — PX: BUBBLE STUDY: SHX6837

## 2021-07-23 LAB — CBC
HCT: 30.2 % — ABNORMAL LOW (ref 36.0–46.0)
Hemoglobin: 9.4 g/dL — ABNORMAL LOW (ref 12.0–15.0)
MCH: 22.2 pg — ABNORMAL LOW (ref 26.0–34.0)
MCHC: 31.1 g/dL (ref 30.0–36.0)
MCV: 71.2 fL — ABNORMAL LOW (ref 80.0–100.0)
Platelets: 472 10*3/uL — ABNORMAL HIGH (ref 150–400)
RBC: 4.24 MIL/uL (ref 3.87–5.11)
RDW: 17.8 % — ABNORMAL HIGH (ref 11.5–15.5)
WBC: 5.6 10*3/uL (ref 4.0–10.5)
nRBC: 0 % (ref 0.0–0.2)

## 2021-07-23 LAB — COMPREHENSIVE METABOLIC PANEL
ALT: 22 U/L (ref 0–44)
AST: 20 U/L (ref 15–41)
Albumin: 2.6 g/dL — ABNORMAL LOW (ref 3.5–5.0)
Alkaline Phosphatase: 123 U/L (ref 38–126)
Anion gap: 10 (ref 5–15)
BUN: 9 mg/dL (ref 6–20)
CO2: 20 mmol/L — ABNORMAL LOW (ref 22–32)
Calcium: 9 mg/dL (ref 8.9–10.3)
Chloride: 108 mmol/L (ref 98–111)
Creatinine, Ser: 1.43 mg/dL — ABNORMAL HIGH (ref 0.44–1.00)
GFR, Estimated: 43 mL/min — ABNORMAL LOW (ref >60)
Glucose, Bld: 82 mg/dL (ref 70–99)
Potassium: 5 mmol/L (ref 3.5–5.1)
Sodium: 138 mmol/L (ref 135–145)
Total Bilirubin: 0.5 mg/dL (ref 0.3–1.2)
Total Protein: 6.9 g/dL (ref 6.5–8.1)

## 2021-07-23 LAB — GLUCOSE, CAPILLARY
Glucose-Capillary: 81 mg/dL (ref 70–99)
Glucose-Capillary: 105 mg/dL — ABNORMAL HIGH (ref 70–99)
Glucose-Capillary: 171 mg/dL — ABNORMAL HIGH (ref 70–99)
Glucose-Capillary: 152 mg/dL — ABNORMAL HIGH (ref 70–99)

## 2021-07-23 LAB — SURGICAL PCR SCREEN
MRSA, PCR: NEGATIVE
Staphylococcus aureus: NEGATIVE

## 2021-07-23 LAB — PATHOLOGIST SMEAR REVIEW

## 2021-07-23 SURGERY — ECHOCARDIOGRAM, TRANSESOPHAGEAL
Anesthesia: Monitor Anesthesia Care

## 2021-07-23 MED ORDER — METOPROLOL TARTRATE 50 MG PO TABS
100.0000 mg | ORAL_TABLET | Freq: Once | ORAL | Status: DC
  Filled 2021-07-23: qty 2

## 2021-07-23 MED ORDER — PROPOFOL 10 MG/ML IV BOLUS
INTRAVENOUS | Status: DC | PRN
  Administered 2021-07-23: 20 mg via INTRAVENOUS
  Administered 2021-07-23: 10 mg via INTRAVENOUS
  Administered 2021-07-23: 30 mg via INTRAVENOUS

## 2021-07-23 MED ORDER — PROPOFOL 500 MG/50ML IV EMUL
INTRAVENOUS | Status: DC | PRN
  Administered 2021-07-23: 100 ug/kg/min via INTRAVENOUS

## 2021-07-23 MED ORDER — PHENYLEPHRINE 80 MCG/ML (10ML) SYRINGE FOR IV PUSH (FOR BLOOD PRESSURE SUPPORT)
PREFILLED_SYRINGE | INTRAVENOUS | Status: DC | PRN
  Administered 2021-07-23: 160 ug via INTRAVENOUS
  Administered 2021-07-23: 80 ug via INTRAVENOUS

## 2021-07-23 MED ORDER — CARVEDILOL 12.5 MG PO TABS
12.5000 mg | ORAL_TABLET | Freq: Two times a day (BID) | ORAL | Status: AC
  Administered 2021-07-23: 12.5 mg via ORAL
  Filled 2021-07-23: qty 1

## 2021-07-23 MED ORDER — SODIUM CHLORIDE 0.9 % IV SOLN: INTRAVENOUS | Status: DC | PRN

## 2021-07-23 NOTE — Anesthesia Postprocedure Evaluation (Signed)
Anesthesia Post Note  Patient: Ashley Vaughn  Procedure(s) Performed: TRANSESOPHAGEAL ECHOCARDIOGRAM (TEE) BUBBLE STUDY     Patient location during evaluation: Endoscopy Anesthesia Type: MAC Level of consciousness: oriented, awake and alert and awake Pain management: pain level controlled Vital Signs Assessment: post-procedure vital signs reviewed and stable Respiratory status: spontaneous breathing, nonlabored ventilation, respiratory function stable and patient connected to nasal cannula oxygen Cardiovascular status: blood pressure returned to baseline and stable Postop Assessment: no headache, no backache and no apparent nausea or vomiting Anesthetic complications: no   No notable events documented.  Last Vitals:  Vitals:   07/23/21 1312 07/23/21 1534  BP: 98/63 115/80  Pulse: 77 86  Resp: 14 14  Temp: 36.7 C 36.7 C  SpO2: 100% 100%    Last Pain:  Vitals:   07/23/21 1534  TempSrc: Oral  PainSc:                  Santa Lighter

## 2021-07-23 NOTE — Transfer of Care (Signed)
Immediate Anesthesia Transfer of Care Note  Patient: Ashley Vaughn  Procedure(s) Performed: TRANSESOPHAGEAL ECHOCARDIOGRAM (TEE) BUBBLE STUDY  Patient Location: Endoscopy Unit  Anesthesia Type:MAC  Level of Consciousness: awake, alert  and oriented  Airway & Oxygen Therapy: Patient Spontanous Breathing and Patient connected to nasal cannula oxygen  Post-op Assessment: Report given to RN and Post -op Vital signs reviewed and stable  Post vital signs: Reviewed and stable  Last Vitals:  Vitals Value Taken Time  BP 86/54 07/23/21 1230  Temp 36.2 C 07/23/21 1230  Pulse 72 07/23/21 1232  Resp 19 07/23/21 1232  SpO2 100 % 07/23/21 1232  Vitals shown include unvalidated device data.  Last Pain:  Vitals:   07/23/21 1230  TempSrc: Temporal  PainSc: 0-No pain         Complications: No notable events documented.

## 2021-07-23 NOTE — Progress Notes (Signed)
Subjective: No acute overnight events.   Patient is doing well this morning with no shortness of breath, swelling, abdominal pain, nausea, vomiting, or focal weakness. She denies any complaints at this morning. No recurrent episodes of seizure-like activity.  Objective:  Vital signs in last 24 hours: Vitals:   07/22/21 2012 07/23/21 0005 07/23/21 0320 07/23/21 0732  BP: 102/64 117/71 126/88 128/75  Pulse: 85 94 88 95  Resp: 17 17 18 16   Temp: 98 F (36.7 C) 98.2 F (36.8 C) 98 F (36.7 C) 98.7 F (37.1 C)  TempSrc: Oral Oral Oral Oral  SpO2: 98% 99% 100% 100%  Weight:      Height:       Weight change:  N/A  Intake/Output Summary (Last 24 hours) at 07/23/2021 1001 Last data filed at 07/23/2021 09/22/2021 Gross per 24 hour  Intake 239 ml  Output 50 ml  Net 189 ml    Physical Exam  Constitutional:  in no acute distress. Alert and oriented x3 Cardiovascular:  Regular rate and rhythm. No lower extremity edema.  Respiratory:  Normal respiratory effort. Lungs are clear to auscultation Abdominal: Abdomen non-distended with no organomegaly  Extremities: No asymmetry noted to the lower extremities Neuro: right sided nasolabial flattening Skin:  No obvious lesion noted  Psych:  Normal mood and behavior     Latest Ref Rng & Units 07/23/2021    3:31 AM 07/22/2021    2:32 AM 07/21/2021   12:37 AM  CBC  WBC 4.0 - 10.5 K/uL 5.6   5.2   7.4    Hemoglobin 12.0 - 15.0 g/dL 9.4   8.2   7.9    Hematocrit 36.0 - 46.0 % 30.2   26.2   25.5    Platelets 150 - 400 K/uL 472   428   469          Latest Ref Rng & Units 07/23/2021    6:43 AM 07/22/2021    2:32 AM 07/21/2021   12:37 AM  CMP  Glucose 70 - 99 mg/dL 82   85   09/20/2021    BUN 6 - 20 mg/dL 9   8   6     Creatinine 0.44 - 1.00 mg/dL 119     1.47    Sodium 135 - 145 mmol/L 138   137   138    Potassium 3.5 - 5.1 mmol/L 5.0   5.0   3.9    Chloride 98 - 111 mmol/L 108   107   106    CO2 22 - 32 mmol/L 20   24   24     Calcium 8.9 - 10.3  mg/dL 9.0   8.8   9.0    Total Protein 6.5 - 8.1 g/dL 6.9   6.5   6.8    Total Bilirubin 0.3 - 1.2 mg/dL 0.5   0.3   0.2    Alkaline Phos 38 - 126 U/L 123   142   156    AST 15 - 41 U/L 20   26   35    ALT 0 - 44 U/L 22   24   30         Assessment/Plan:  Principal Problem:   Seizure (HCC) Active Problems:   Human immunodeficiency virus (HIV) disease (HCC)   Postprocedural intraabdominal abscess   CVA (cerebral vascular accident) (HCC)   Cardiomyopathy (HCC)   [name] is a  56 y.o. female with PMH significant for T2DM, HTN,  HIV, and recent appendectomy complicated by an abscess who was admitted for episodes of seizure-like activity and was noted to have a possible embolic stroke as well as newly diagnosed heart failure with reduced EF (30-35%).  Embolic CVA No focal weakness and patient is currently on DAPT. TTE did not show LV thrombus. She is scheduled for TEE today to evaluate for possible embolic etiology of stroke. Blood pressure is improved at 126/88 today since being started on carvedilol. Dose was increased yesterday to 12.5 mg BID. -Stroke team following and appreciate further recommendation -Continue aspirin, Plavix daily.  -Consider transitioning to anticoagulation therapy -Continue Crestor 20 mg daily -Continue to monitor  HFrEF (30-35%) Patient is asymptomatic and euvolemic. Unclear etiology of heart failure but possibly due to controlled hypertension. Blood pressure is normalized today since being started on carvedilol 12.5 mg BID. Also on losartan 100 mg daily and HCTZ 12.5 mg daily. Awaiting TEE today. Cardiology team plans on performing coronary CTA tomorrow to evaluate for ischemic etiology of heart failure.  -Continue carvedilol 12.5 mg BID (hold prior to CTA) -lopressor once tomorrow AM -Continue Jardiance 10 mg daily -Continue  Losartan 100 mg daily. Will eventually switch to [facility] for GDMT.  -Continue to monitor  Seizure-like activity No recurrent  episodes of seizures. Currently on Keppra. Neurology recommended discontinuing Keppra at this time due to no definite evidence of seizures.  -Neurology following and appreciate further recommendation -DC keppra -Continue to monitor   Type 2 DM Blood glucose under better control. Glucose in 80s. -Continue jardiance 10 mg daily -Continue Semglee 20 units -Continue metformin 500 mg BID -Continue SSI -Continue to monitor  Postsurgical intra-abdominal abscess Currently on antibiotics. Patient remains afebrile without leukocytosis.  -Continue daily ciprofloxacin, metronidazole x2 weeks as recommended by ID.  -Continue to monitor  HIV Most recent viral load in March 2023 was undetectable.  Continue Odefsey daily  Hypertension Blood pressure has normalized.  -Continue HCTZ, losartan, carvedilol as noted above.    LOS: 5 days   Ezzard Flax, Medical Student 07/23/2021, 10:01 AM  Pager number: [phone]  Attestation for Student Documentation:  I personally was present and performed or re-performed the history, physical exam and medical decision-making activities of this service and have verified that the service and findings are accurately documented in the student's note. Haejung called and updated patient's daughter.  Ashley Sebring, DO 07/23/2021, 11:06 AM

## 2021-07-23 NOTE — Progress Notes (Addendum)
  Echocardiogram Echocardiogram transesophagael has been performed.  Gerda Diss 07/23/2021, 2:03 PM

## 2021-07-23 NOTE — Progress Notes (Addendum)
Progress Note  Patient Name: Ashley Vaughn Date of Encounter: 07/23/2021  [facility] Cardiologist: None   Subjective   Denies any chest pain, SOB, dizziness or palpitations  Inpatient Medications    Scheduled Meds:  aspirin EC  81 mg Oral Daily   carvedilol  12.5 mg Oral BID WC   ciprofloxacin  750 mg Oral BID   clopidogrel  75 mg Oral Daily   DULoxetine  60 mg Oral Daily   empagliflozin  10 mg Oral Daily   emtricitabine-rilpivir-tenofovir AF  1 tablet Oral Q breakfast   heparin  5,000 Units Subcutaneous Q8H   hydrochlorothiazide  12.5 mg Oral Daily   insulin aspart  0-15 Units Subcutaneous TID WC   insulin aspart  0-5 Units Subcutaneous QHS   insulin glargine-yfgn  20 Units Subcutaneous Daily   levETIRAcetam  1,000 mg Oral BID   losartan  100 mg Oral Daily   metFORMIN  500 mg Oral BID WC   metroNIDAZOLE  500 mg Oral Q12H   potassium chloride  40 mEq Oral BID   rosuvastatin  20 mg Oral Daily   sodium chloride flush  5 mL Intracatheter Q8H   Continuous Infusions:  PRN Meds: acetaminophen, LORazepam, polyethylene glycol   Vital Signs    Vitals:   07/22/21 2012 07/23/21 0005 07/23/21 0320 07/23/21 0732  BP: 102/64 117/71 126/88 128/75  Pulse: 85 94 88 95  Resp: 17 17 18 16   Temp: 98 F (36.7 C) 98.2 F (36.8 C) 98 F (36.7 C) 98.7 F (37.1 C)  TempSrc: Oral Oral Oral Oral  SpO2: 98% 99% 100% 100%  Weight:      Height:        Intake/Output Summary (Last 24 hours) at 07/23/2021 0912 Last data filed at 07/23/2021 [id number] Gross per 24 hour  Intake 239 ml  Output 50 ml  Net 189 ml       07/19/2021    4:17 PM 07/18/2021    8:30 AM 05/09/2021    2:47 PM  Last 3 Weights  Weight (lbs) 140 lb 141 lb 143 lb 9.6 oz  Weight (kg) 63.504 kg 63.957 kg 65.137 kg      Telemetry    NSR- Personally Reviewed  ECG    No new tracing- Personally Reviewed  Physical Exam  GEN: Well nourished, well developed in no acute distress HEENT: Normal NECK: No JVD; No carotid  bruits LYMPHATICS: No lymphadenopathy CARDIAC:RRR, no murmurs, rubs, gallops RESPIRATORY:  Clear to auscultation without rales, wheezing or rhonchi  ABDOMEN: Soft, non-tender, non-distended MUSCULOSKELETAL:  No edema; No deformity  SKIN: Warm and dry NEUROLOGIC:  Alert and oriented x 3 PSYCHIATRIC:  Normal affect   Labs    High Sensitivity Troponin:   Recent Labs  Lab 07/18/21 0010 07/18/21 0317  TROPONINIHS 10 16      Chemistry Recent Labs  Lab 07/18/21 1831 07/19/21 0359 07/20/21 0347 07/21/21 0037 07/22/21 0232 07/23/21 0643  NA 140 140   < > 138 137 138  K 3.2* 2.9*   < > 3.9 5.0 5.0  CL 108 107   < > 106 107 108  CO2 22 26   < > 24 24 20*  GLUCOSE 324* 257*   < > 118* 85 82  BUN 9 6   < > 6 8 9   CREATININE 0.89 0.81   < > 1.03* 1.22* 1.43*  CALCIUM 8.7* 8.6*   < > 9.0 8.8* 9.0  MG 1.9 1.9  --   --   --   --  PROT  --  6.5   < > 6.8 6.5 6.9  ALBUMIN  --  2.3*   < > 2.3* 2.3* 2.6*  AST  --  17   < > 35 26 20  ALT  --  16   < > 30 24 22   ALKPHOS  --  116   < > 156* 142* 123  BILITOT  --  0.2*   < > 0.2* 0.3 0.5  GFRNONAA >60 >60   < > >60 52* 43*  ANIONGAP 10 7   < > 8 6 10    < > = values in this interval not displayed.     Lipids  Recent Labs  Lab 07/19/21 0359  CHOL 148  TRIG 118  HDL 34*  LDLCALC 90  CHOLHDL 4.4     Hematology Recent Labs  Lab 07/21/21 0037 07/21/21 1407 07/22/21 0232 07/23/21 0331  WBC 7.4  --  5.2 5.6  RBC 3.68* 3.72* 3.77* 4.24  HGB 7.9*  --  8.2* 9.4*  HCT 25.5*  --  26.2* 30.2*  MCV 69.3*  --  69.5* 71.2*  MCH 21.5*  --  21.8* 22.2*  MCHC 31.0  --  31.3 31.1  RDW 17.0*  --  17.1* 17.8*  PLT 469*  --  428* 472*    Thyroid No results for input(s): TSH, FREET4 in the last 168 hours.  BNPNo results for input(s): BNP, PROBNP in the last 168 hours.  DDimer No results for input(s): DDIMER in the last 168 hours.   Radiology    ECHOCARDIOGRAM LIMITED  Result Date: 07/21/2021    ECHOCARDIOGRAM LIMITED REPORT    Patient Name:   Ashley Vaughn Date of Exam: 07/21/2021 Medical Rec #:  Ashley Vaughn   Height:       62.0 in Accession #:    09/20/2021  Weight:       140.0 lb Date of Birth:  [birth year]   BSA:          1.643 m Patient Age:    56 years    BP:           145/92 mmHg Patient Gender: F           HR:           95 bpm. Exam Location:  Inpatient Procedure: Limited Echo and Intracardiac Opacification Agent Indications:    Stroke i63.9  History:        Patient has prior history of Echocardiogram examinations, most                 recent 07/19/2021. Cardiomyopathy; Risk Factors:Hypertension,                 Diabetes and HIV.  Sonographer:    07/30/1965 Senior RDCS Referring Phys: [phone] MIHAI CROITORU  Sonographer Comments: Limited to evaluate for LV thrombus IMPRESSIONS  1. No evidence of left ventricular thrombus with Definity contrast. Left ventricular ejection fraction, by estimation, is 30 to 35%. The left ventricle has moderately decreased function. The left ventricle demonstrates global hypokinesis. Conclusion(s)/Recommendation(s): Limited study for LV thrombus. FINDINGS  Left Ventricle: No evidence of left ventricular thrombus with Definity contrast. Left ventricular ejection fraction, by estimation, is 30 to 35%. The left ventricle has moderately decreased function. The left ventricle demonstrates global hypokinesis. Definity contrast agent was given IV to delineate the left ventricular endocardial borders. Ashley Vaughn Electronically signed by [id number] Vaughn Signature Date/Time: 07/21/2021/1:02:26 PM    Final  Cardiac Studies   ECHOCARDIOGRAM LIMITED 13-Aug-2021 1. No evidence of left ventricular thrombus with Definity contrast. Left ventricular ejection fraction, by estimation, is 30 to 35%. The left ventricle has moderately decreased function. The left ventricle demonstrates global hypokinesis. Conclusion(s)/Recommendation(s): Limited study for LV thrombus.  Patient Profile     56 y.o. female with history of poorly  controlled diabetes mellitus and hypertension, chronic HIV infection on antiretroviral therapies, anemia, intra-abdominal abscess following recent appendectomy, requiring percutaneous drain, admitted with seizure and found to have evidence of acute ischemic infarct scattered in the subcortical right cerebral hemisphere, suspicious for embolic event.  Noted to have severely depressed left ventricular systolic function on echocardiography, but without clinically evident heart failure.  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  Assessment & Plan    Cardiomyopathy/systolic HF:  -Appears well compensated hemodynamically/euvolemic.  No symptoms of left heart failure.  -Etiology uncertain, but suspect insufficiently treated hypertension may have a lot to do with it.  -Has multiple risk factors for CAD (including uncontrolled diabetes mellitus), but does not have angina or ECG changes suggestive of ischemia.  -continue Carvedilol 12.5mg  BID, Jardiance 10mg  daily and Losartan 100mg  daily -She is already on maximum dose of losartan>>Long-term plan to switch to [facility] and eventually add spironolactone and SGLT2 inhibitor. -will make NPO after MN for coronary CTA in am.  HR in the 80-90's and BP ok so will give Lopressor 100mg  in am prior to CTA and hold am dose of Carvedilol -hold diuretic for coronary CTA  Embolic stroke:  -Stroke service requests TEE for potential cardioembolic source of stroke.   -Plan for TEE today  HIV:  -On antiretroviral therapy.   -Undetectable viral load as of March 2023.  Normal CD4 count 1447.  Anemia:  -Microcytic, but with normal iron stores by labs.   -Microcytosis is a chronic finding dating back more than 10 years, consistent with thalassemia trait, but baseline hemoglobin seems to be around 12.  DM:  -poorly controlled -treatment per [facility]  Appendicitis  -complicated  by retrocecal abscess with indwelling drain  I have spent a total of 35 minutes with patient reviewing 2D echo , telemetry, EKGs, labs and examining patient as well as establishing an assessment and plan that was discussed with the patient.  > 50% of time was spent in direct patient care.        For questions or updates, please contact [facility] Please consult [email or web address] for contact info under        Signed, [facility], Vaughn  07/23/2021, 9:12 AM

## 2021-07-23 NOTE — Anesthesia Procedure Notes (Signed)
Procedure Name: MAC Date/Time: 07/23/2021 12:02 PM Performed by: Carolan Clines, CRNA Pre-anesthesia Checklist: Patient identified, Emergency Drugs available, Suction available and Patient being monitored Patient Re-evaluated:Patient Re-evaluated prior to induction Oxygen Delivery Method: Nasal cannula Dental Injury: Teeth and Oropharynx as per pre-operative assessment

## 2021-07-23 NOTE — Anesthesia Preprocedure Evaluation (Signed)
Anesthesia Evaluation  Patient identified by MRN, date of birth, ID band Patient awake    Reviewed: Allergy & Precautions, NPO status , Patient's Chart, lab work & pertinent test results  Airway Mallampati: II  TM Distance: >3 FB Neck ROM: Full    Dental  (+) Dental Advisory Given, Missing   Pulmonary neg pulmonary ROS,    Pulmonary exam normal breath sounds clear to auscultation       Cardiovascular hypertension, Pt. on medications Normal cardiovascular exam Rhythm:Regular Rate:Normal     Neuro/Psych Seizures -,  PSYCHIATRIC DISORDERS Depression CVA    GI/Hepatic negative GI ROS, Neg liver ROS,   Endo/Other  diabetes, Type 2, Oral Hypoglycemic Agents  Renal/GU negative Renal ROS     Musculoskeletal negative musculoskeletal ROS (+)   Abdominal   Peds  Hematology  (+) Blood dyscrasia, anemia , HIV,   Anesthesia Other Findings Day of surgery medications reviewed with the patient.  Reproductive/Obstetrics                             Anesthesia Physical Anesthesia Plan  ASA: 3  Anesthesia Plan: MAC   Post-op Pain Management:    Induction: Intravenous  PONV Risk Score and Plan: 2 and TIVA and Treatment may vary due to age or medical condition  Airway Management Planned: Nasal Cannula  Additional Equipment:   Intra-op Plan:   Post-operative Plan:   Informed Consent: I have reviewed the patients History and Physical, chart, labs and discussed the procedure including the risks, benefits and alternatives for the proposed anesthesia with the patient or authorized representative who has indicated his/her understanding and acceptance.     Dental advisory given  Plan Discussed with: CRNA and Anesthesiologist  Anesthesia Plan Comments:         Anesthesia Quick Evaluation

## 2021-07-23 NOTE — Interval H&P Note (Signed)
History and Physical Interval Note:  07/23/2021 11:45 AM  Ashley Vaughn  has presented today for surgery, with the diagnosis of stroke.  The various methods of treatment have been discussed with the patient and family. After consideration of risks, benefits and other options for treatment, the patient has consented to  Procedure(s): TRANSESOPHAGEAL ECHOCARDIOGRAM (TEE) (N/A) as a surgical intervention.  The patient's history has been reviewed, patient examined, no change in status, stable for surgery.  I have reviewed the patient's chart and labs.  Questions were answered to the patient's satisfaction.     Shun Pletz Cristal Deer

## 2021-07-23 NOTE — Progress Notes (Signed)
Supervising Physician: Arne Cleveland  Patient Status:  [facility] [address] - In-pt  Chief Complaint:  Laparoscopic appendectomy with post-procedure retrocecal abscess s/p drain placement in IR (At [facility]) followed by drain exchange/upsize 07/19/21 by Dr. Vernard Gambles   Subjective:  Pt resting in bed. She denies pain/tenderness at insertion site.   Allergies: Fluorescein  Medications: Prior to Admission medications   Medication Sig Start Date End Date Taking? Authorizing Provider  amoxicillin-clavulanate (AUGMENTIN) 875-125 MG tablet Take 1 tablet by mouth See admin instructions. Bid x 14 days 07/12/21  Yes [provider]  Cholecalciferol (VITAMIN D3) 5000 units TABS Take 5,000 Units by mouth daily.   Yes [provider]  ciprofloxacin (CIPRO) 750 MG tablet Take 750 mg by mouth See admin instructions. Bid x 14 days 07/12/21  Yes [provider]  DULoxetine (CYMBALTA) 60 MG capsule Take 1 capsule (60 mg total) by mouth daily. 05/23/21 09/22/21 Yes Demaio, Alexa, MD  Empagliflozin-metFORMIN HCl (SYNJARDY) 5-500 MG TABS Take 1 tablet by mouth 2 (two) times daily. 07/18/21  Yes Demaio, Alexa, MD  emtricitabine-rilpivir-tenofovir AF (ODEFSEY) 200-25-25 MG TABS tablet Take 1 tablet by mouth daily with breakfast. 07/18/21  Yes Demaio, Alexa, MD  insulin glargine (LANTUS SOLOSTAR) 100 UNIT/ML Solostar Pen Inject 30 Units into the skin daily. 07/18/21 02/03/22 Yes Demaio, Alexa, MD  losartan-hydrochlorothiazide (HYZAAR) 50-12.5 MG tablet Take 1 tablet by mouth daily. 05/08/21  Yes Lajean Manes, MD  Accu-Chek Softclix Lancets lancets Check blood sugar 3 times per day 05/23/21   Scarlett Presto, MD  B-D UF III MINI PEN NEEDLES 31G X 5 MM MISC Use to inject insulin one time daily 07/18/21   Demaio, Alexa, MD  Blood Glucose Monitoring Suppl (ACCU-CHEK GUIDE ME) w/Device KIT 1 kit by Does not apply route daily. Patient not taking: Reported on 03/16/2021 09/30/19   Kuppelweiser, Cassie L, RPH-CPP  Blood  Glucose Monitoring Suppl (ACCU-CHEK NANO SMARTVIEW) w/Device KIT 1 each by Does not apply route 2 (two) times daily. Check blood sugar 4x a day before meals and bedtime  2 days each week. Dx code- 250.00. 07/18/21   Demaio, Roxine Caddy, MD  glucose blood (ACCU-CHEK GUIDE) test strip Check blood sugar 3 times per day 07/18/21   Demaio, Alexa, MD  Lancets Misc. (ACCU-CHEK SOFTCLIX LANCET DEV) KIT Use to check blood sugar up to 3 times a day before meals. 07/18/21   Scarlett Presto, MD     Vital Signs: BP 98/63   Pulse 77   Temp 98 F (36.7 C) (Oral)   Resp 14   Ht _0  (1.575 m)   Wt 140 lb (63.5 kg)   SpO2 100%   BMI 25.61 kg/m   Physical Exam Constitutional:      General: She is not in acute distress.    Appearance: Normal appearance. She is not ill-appearing.  Eyes:     Extraocular Movements: Extraocular movements intact.     Pupils: Pupils are equal, round, and reactive to light.  Pulmonary:     Effort: Pulmonary effort is normal. No respiratory distress.  Abdominal:     Comments: RLQ drain unremarkable with sutures/statlock in place. Mild crusting around insertion site. No oozing, bleeding, erythema or other signs of infection. Scant amt yellow fluid in gravity bag. Dressing C/D/I.   Skin:    General: Skin is warm and dry.  Neurological:     Mental Status: She is alert and oriented to person, place, and time.  Psychiatric:  Mood and Affect: Mood normal.        Behavior: Behavior normal.        Thought Content: Thought content normal.        Judgment: Judgment normal.    Imaging: IR Catheter Tube Change  Result Date: 07/19/2021 INDICATION: Previous active appendectomy, postop abscess, status post percutaneous drain catheter placement. Recent CT shows incomplete evacuation of the complex residual abscess. Drainage vision requested. EXAM: EXCHANGE OF ABDOMINAL DRAIN CATHETER UNDER FLUOROSCOPY MEDICATIONS: no periprocedural antibiotics were indicated ANESTHESIA/SEDATION:  Lidocaine 1% subcutaneous COMPLICATIONS: None immediate. PROCEDURE: Informed written consent was obtained from the patient after a thorough discussion of the procedural risks, benefits and alternatives. All questions were addressed. Maximal Sterile Barrier Technique was utilized including caps, mask, sterile gowns, sterile gloves, sterile drape, hand hygiene and skin antiseptic. A timeout was performed prior to the initiation of the procedure. The previously placed right lower quadrant catheter and surrounding skin were prepped with Betadine, draped in usual sterile fashion. 1% lidocaine was infiltrated subcutaneously around the catheter. Small gentle contrast injection opacifies a small right lower quadrant residual abscess cavity. No fistula to bowel was identified. The catheter was removed over an Amplatz wire and a new 12 French pigtail drain catheter was advanced and formed centrally within the collection. Several mL of thin purulent material spontaneously returned out the catheter. The catheter was secured to the skin with 0 Prolene suture, and placed to gravity drain bag. The patient tolerated the procedure well. IMPRESSION: 1. Technically successful exchange and upsizing of right lower quadrant pigtail drain catheter. Electronically Signed   By: Lucrezia Europe M.D.   On: 07/19/2021 16:21   ECHO TEE  Result Date: 07/23/2021    TRANSESOPHOGEAL ECHO REPORT   Patient Name:   Ashley Vaughn Date of Exam: 07/23/2021 Medical Rec #:  [id number]   Height:       62.0 in Accession #:    [id number]  Weight:       140.0 lb Date of Birth:  May 22, 1965   BSA:          1.643 m Patient Age:    56 years    BP:           101/68 mmHg Patient Gender: F           HR:           69 bpm. Exam Location:  Inpatient Procedure: Transesophageal Echo, Color Doppler and Saline Contrast Bubble Study Indications:     Stroke  History:         Patient has prior history of Echocardiogram examinations, most                  recent 07/21/2021.  Cardiomyopathy; Risk Factors:Hypertension.                  CVA, HIV.  Sonographer:     Clayton Lefort RDCS (AE) Referring Phys:  [id number] Erma Heritage Diagnosing Phys: Buford Dresser MD PROCEDURE: After discussion of the risks and benefits of a TEE, an informed consent was obtained from the patient. The transesophogeal probe was passed without difficulty through the esophogus of the patient. Sedation performed by different physician. The patient was monitored while under deep sedation. Anesthestetic sedation was provided intravenously by Anesthesiology: 158.69m of Propofol. Image quality was good. The patient developed no complications during the procedure. IMPRESSIONS  1. Left ventricular ejection fraction, by estimation, is 30 to 35%. The left ventricle has moderately decreased function. The  left ventricle demonstrates global hypokinesis.  2. Right ventricular systolic function is normal. The right ventricular size is normal.  3. No left atrial/left atrial appendage thrombus was detected.  4. The mitral valve is normal in structure. Trivial mitral valve regurgitation. No evidence of mitral stenosis.  5. The aortic valve is tricuspid. Aortic valve regurgitation is not visualized. No aortic stenosis is present.  6. There is mild (Grade II) plaque involving the descending aorta.  7. Cannot exclude a small PFO. trivial right to left shunt. Conclusion(s)/Recommendation(s): No evidence of PFO by color doppler. Agitated saline injection shows trivial bubble within 3 beats, consistent with possible small right to left intracardiac shunt. FINDINGS  Left Ventricle: Left ventricular ejection fraction, by estimation, is 30 to 35%. The left ventricle has moderately decreased function. The left ventricle demonstrates global hypokinesis. The left ventricular internal cavity size was normal in size. Right Ventricle: The right ventricular size is normal. No increase in right ventricular wall thickness. Right ventricular  systolic function is normal. Left Atrium: Left atrial size was normal in size. No left atrial/left atrial appendage thrombus was detected. Right Atrium: Right atrial size was normal in size. Prominent Eustachian valve. Pericardium: Trivial pericardial effusion is present. Mitral Valve: The mitral valve is normal in structure. Trivial mitral valve regurgitation. No evidence of mitral valve stenosis. There is no evidence of mitral valve vegetation. Tricuspid Valve: The tricuspid valve is normal in structure. Tricuspid valve regurgitation is trivial. No evidence of tricuspid stenosis. There is no evidence of tricuspid valve vegetation. Aortic Valve: The aortic valve is tricuspid. Aortic valve regurgitation is not visualized. No aortic stenosis is present. There is no evidence of aortic valve vegetation. Pulmonic Valve: The pulmonic valve was grossly normal. Pulmonic valve regurgitation is not visualized. No evidence of pulmonic stenosis. There is no evidence of pulmonic valve vegetation. Aorta: The aortic root and ascending aorta are structurally normal, with no evidence of dilitation. There is mild (Grade II) plaque involving the descending aorta. IAS/Shunts: Cannot exclude a small PFO. Agitated saline contrast was given intravenously to evaluate for intracardiac shunting. Trivial right to left shunt. Buford Dresser MD Electronically signed by Buford Dresser MD Signature Date/Time: 07/23/2021/2:44:52 PM    Final    ECHOCARDIOGRAM LIMITED  Result Date: 07/21/2021    ECHOCARDIOGRAM LIMITED REPORT   Patient Name:   Ashley Vaughn Date of Exam: 07/21/2021 Medical Rec #:  [id number]   Height:       62.0 in Accession #:    [id number]  Weight:       140.0 lb Date of Birth:  1965/11/15   BSA:          1.643 m Patient Age:    56 years    BP:           145/92 mmHg Patient Gender: F           HR:           95 bpm. Exam Location:  Inpatient Procedure: Limited Echo and Intracardiac Opacification Agent Indications:     Stroke i63.9  History:        Patient has prior history of Echocardiogram examinations, most                 recent 07/19/2021. Cardiomyopathy; Risk Factors:Hypertension,                 Diabetes and HIV.  Sonographer:    Raquel Sarna Senior RDCS Referring Phys: [place]  Sonographer Comments: Limited  to evaluate for LV thrombus IMPRESSIONS  1. No evidence of left ventricular thrombus with Definity contrast. Left ventricular ejection fraction, by estimation, is 30 to 35%. The left ventricle has moderately decreased function. The left ventricle demonstrates global hypokinesis. Conclusion(s)/Recommendation(s): Limited study for LV thrombus. FINDINGS  Left Ventricle: No evidence of left ventricular thrombus with Definity contrast. Left ventricular ejection fraction, by estimation, is 30 to 35%. The left ventricle has moderately decreased function. The left ventricle demonstrates global hypokinesis. Definity contrast agent was given IV to delineate the left ventricular endocardial borders. Dani Gobble Croitoru MD Electronically signed by Sanda Klein MD Signature Date/Time: 07/21/2021/1:02:26 PM    Final     Labs:  CBC: Recent Labs    07/20/21 0347 07/21/21 0037 07/22/21 0232 07/23/21 0331  WBC 5.7 7.4 5.2 5.6  HGB 7.2* 7.9* 8.2* 9.4*  HCT 23.3* 25.5* 26.2* 30.2*  PLT 411* 469* 428* 472*    COAGS: Recent Labs    07/20/21 0347  INR 1.1    BMP: Recent Labs    07/20/21 0347 07/21/21 0037 07/22/21 0232 07/23/21 0643  NA 140 138 137 138  K 4.3 3.9 5.0 5.0  CL 112* 106 107 108  CO2 _0 20*  GLUCOSE 155* 118* 85 82  BUN _1 CALCIUM 8.3* 9.0 8.8* 9.0  CREATININE 0.81 1.03* 1.22* 1.43*  GFRNONAA >60 >60 52* 43*    LIVER FUNCTION TESTS: Recent Labs    07/20/21 0347 07/21/21 0037 07/22/21 0232 07/23/21 0643  BILITOT 0.2* 0.2* 0.3 0.5  AST 47* 35 26 20  ALT _2 ALKPHOS 129* 156* 142* 123  PROT 6.3* 6.8 6.5 6.9  ALBUMIN 2.1* 2.3* 2.3* 2.6*    Assessment and  Plan:  Laparoscopic appendectomy with post-procedure retrocecal abscess s/p drain placement in IR (At [facility]) followed by drain exchange/upsize 07/19/21 by Dr. Vernard Gambles.  Pt resting in bed. She denies pain/tenderness at insertion site.   RLQ drain unremarkable with sutures/statlock in place. Mild crusting around insertion site. No oozing, bleeding, erythema or other signs of infection. Scant amt yellow fluid in gravity bag. Dressing C/D/I. 50 cc documented OP in gravity bag.   Labs WNL VSS  Continue to flush drain with 5 cc NS TID. Record OP q shift.  Change dressing q shift or as needed to keep clean and dry.  Contact IR if difficulty flushing or if there is a sudden change in OP.   IR to follow. Call IR with questions/concerns.    Electronically Signed: Tyson Alias, NP 07/23/2021, 3:05 PM   I spent a total of 15 Minutes at the the patient's bedside AND on the patient's hospital floor or unit, greater than 50% of which was counseling/coordinating care for retrocecal abscess drain placement.

## 2021-07-23 NOTE — CV Procedure (Signed)
    TRANSESOPHAGEAL ECHOCARDIOGRAM   NAME:  Ashley Vaughn   MRN: [id number] DOB:  24-Jan-1966   ADMIT DATE: 07/18/2021  INDICATIONS: CVA  PROCEDURE:   Informed consent was obtained prior to the procedure. The risks, benefits and alternatives for the procedure were discussed and the patient comprehended these risks.  Risks include, but are not limited to, cough, sore throat, vomiting, nausea, somnolence, esophageal and stomach trauma or perforation, bleeding, low blood pressure, aspiration, pneumonia, infection, trauma to the teeth and death.    Procedural time out performed. The oropharynx was anesthetized with topical 1% cetacaine.    Patient received monitored anesthesia care under the supervision of Dr. Desmond Lope. Patient received a total of 158 mg propofol during the procedure.  The transesophageal probe was inserted in the esophagus and stomach without difficulty and multiple views were obtained.    COMPLICATIONS:    There were no immediate complications.  FINDINGS:  LEFT VENTRICLE: EF = 30-35%. Global hypokinesis, No regional wall motion abnormalities.  RIGHT VENTRICLE: Normal size and function.   LEFT ATRIUM: No thrombus/mass.  LEFT ATRIAL APPENDAGE: No thrombus/mass.   RIGHT ATRIUM: No thrombus/mass.  AORTIC VALVE:  Trileaflet. No regurgitation. No vegetation.  MITRAL VALVE:    Normal structure. Trivial regurgitation. No vegetation.  TRICUSPID VALVE: Normal structure. No significant regurgitation. No vegetation.  PULMONIC VALVE: Grossly normal structure. Trivial regurgitation. No apparent vegetation.  INTERATRIAL SEPTUM: No PFO or ASD seen by color Doppler. Agitated saline showed trivial right to left shunt  PERICARDIUM: Trivial effusion noted.  DESCENDING AORTA: Mild diffuse plaque seen   CONCLUSION: No evidence of PFO by color doppler. Agitated saline injection shows trivial bubble within 3 beats, consistent with possible small right to left intracardiac  shunt.   Jodelle Red, MD, PhD [facility]  [address], [address] [place], [place] [address] [phone]   12:21 PM

## 2021-07-23 NOTE — Progress Notes (Addendum)
STROKE TEAM PROGRESS NOTE   INTERVAL HISTORY Patient is back from TEE and lying comfortably in bed pt no acute event overnight, neuro intact.  .  Vital signs stable.  Neuro exam unchanged.  TEE shows no intracardiac clot, vegetation.  Trivial right to left shunt noted on bubble injection but not on color Doppler.  Hemoglobin is stable and now is up to 9.4.Marland Kitchen  She has decided not to participate in the sleep smart study Vitals:   07/23/21 1230 07/23/21 1240 07/23/21 1250 07/23/21 1312  BP: (!) 86/54 (!) 91/56 109/67 98/63  Pulse: 71 73 76 77  Resp: 17 18 16 14   Temp: (!) 97.2 F (36.2 C)   98 F (36.7 C)  TempSrc: Temporal   Oral  SpO2: 100% 100% 100% 100%  Weight:      Height:       CBC:  Recent Labs  Lab 07/19/21 0359 07/20/21 0347 07/21/21 0037 07/22/21 0232 07/23/21 0331  WBC 6.7 5.7   < > 5.2 5.6  NEUTROABS 3.0 2.3  --   --   --   HGB 8.1* 7.2*   < > 8.2* 9.4*  HCT 26.7* 23.3*   < > 26.2* 30.2*  MCV 70.8* 70.0*   < > 69.5* 71.2*  PLT 449* 411*   < > 428* 472*   < > = values in this interval not displayed.   Basic Metabolic Panel:  Recent Labs  Lab 07/18/21 1831 07/19/21 0359 07/20/21 0347 07/22/21 0232 07/23/21 0643  NA 140 140   < > 137 138  K 3.2* 2.9*   < > 5.0 5.0  CL 108 107   < > 107 108  CO2 22 26   < > 24 20*  GLUCOSE 324* 257*   < > 85 82  BUN 9 6   < > 8 9  CREATININE 0.89 0.81   < > 1.22* 1.43*  CALCIUM 8.7* 8.6*   < > 8.8* 9.0  MG 1.9 1.9  --   --   --   PHOS  --  3.2  --   --   --    < > = values in this interval not displayed.   Lipid Panel:  Recent Labs  Lab 07/19/21 0359  CHOL 148  TRIG 118  HDL 34*  CHOLHDL 4.4  VLDL 24  LDLCALC 90   HgbA1c:  Recent Labs  Lab 07/19/21 0359  HGBA1C 14.2*   Urine Drug Screen: No results for input(s): LABOPIA, COCAINSCRNUR, LABBENZ, AMPHETMU, THCU, LABBARB in the last 168 hours.  Alcohol Level No results for input(s): ETH in the last 168 hours.  IMAGING past 24 hours No results  found.  PHYSICAL EXAM  Physical Exam  Constitutional: Appears well-developed and well-nourished Ashley Vaughn.  Cardiovascular: Normal rate and regular rhythm.  Respiratory: Effort normal, non-labored breathing  Neuro: Mental Status: Patient is awake, alert, oriented to person, place, month, year, and situation. Patient is able to give a clear and coherent history. No signs of aphasia or neglect Cranial Nerves: II: Visual Fields are full. Pupils are equal, round, and reactive to light.   III,IV, VI: EOMI without ptosis or diploplia. Gaze is midline. V: Facial sensation is symmetric to temperature VII: Facial movement is symmetric resting and smiling VIII: Hearing is intact to voice X: Palate elevates symmetrically XI: Shoulder shrug is symmetric. XII: Tongue protrudes midline without atrophy or fasciculations.  Motor: Tone is normal. Bulk is normal. 5/5 strength was present in  all four extremities.  Sensory: Sensation is symmetric to light touch and temperature in the arms and legs. No extinction to DSS present.  Cerebellar: FNF and HKS are intact bilaterally   ASSESSMENT/PLAN Ashley Vaughn is a 56 y.o. female with history of  hypertension, type 2 diabetes, HIV, HSV, recent appendectomy complicated by intra-abdominal abscess requiring percutaneous drainage and antibiotics, who presented to [facility] ED due to seizure-like activity at home and had one episode in ED. She was also in the ED 5/30 for a syncopal episode preceded by nausea and vomiting. MRI shows scattered acute ischemic infarcts involving the subcortical right cerebral hemisphere. No focal neurological deficits.   Stroke:  right occipital WM, right MCA/ACA 3 small acute infarcts, etiology unclear, concerning for cardioembolic source from low EF, especially with potential nonocclusive thrombus at R ICA terminus Code Stroke CT head No acute abnormality.  CTA head & neck- positive for questionable filling  defect - nonocclusive thrombus in the supraclinoid Right ICA. Underlying age advanced atherosclerosis at both carotid bifurcations and ICA siphons. 60% Right ICA origin stenosis (at most 40% on my calculation). Moderate stenosis of the Left ICA cavernous segment MRI  Few scattered subcentimeter acute ischemic infarcts involving the subcortical right cerebral hemisphere as above.  2D Echo EF < 20% Vas duplex DVT- no evidence of DVT LDL 90 HgbA1c 14.2 UDS pending VTE prophylaxis - SCDs No antithrombotic prior to admission, now on aspirin 81 mg daily and clopidogrel 75 mg daily. May need to consider anticoagulation if TEE shows a clot Therapy recommendations:  Pending Disposition:  Pending  Seizure like activity  Neck shaking lasting 10 min per pt without LOC Inconsistent report of arm shaking EEG-independent bilateral temporal slowing, no seizure S/p keppra loading Keppra discontinued in 07/23/2021 as no clear-cut evidence of seizure  Cardiomyopathy  TTE showed EF < 20% Cardiology on board Await TEE to rule out LV thrombus Given low EF, embolic strokes and nonocclusive ICA termius thrombus, would have recommended empirical anticoagulation. Ideally with eliquis as coumadin may interfere with her HIV meds. However, currently severe anemic, not good candidate for [facility].  Recommend Plavix 75 mg daily alone and will not add aspirin due to bleeding Hypertension Stable on the high end Gradually normalized BP in 2-3 days Long-term BP goal normotensive  Hyperlipidemia LDL 90, goal < 70 Add Crestor 20mg   Continue statin at discharge  Diabetes type II Uncontrolled Home meds:  Insulin HgbA1c 14.2, goal < 7.0 CBGs hyperglycemia SSI Close PCP follow up for better DM control  Anemia  Hb 8.1->7.2 Could be dilutional No AC for now unless TEE shows a definite clot  off IVF  Other Active Problems Hypokalemia K 2.9 -> supplement HIV on HARRT - reported in good control, ID on board Abdominal  abscess CT Abd- Patient is status post appendectomy. Enhancing fluid collection containing air in the right paracolic gutter extending into the adjacent intramuscular wall consistent with abscess. Percutaneous drainage catheter in place within this collection. Gen surgery consulted for management  Augmentin and cipro at home -> now on flagyl and cipro - ID on board  Hospital day # 5  Patient presented with multiple small l embolic strokes with possible small clot in terminal right ICA and echo shows very low EF raising strong possibility of cardiogenic embolism however given her  significant anemia will hold off on anticoagulation for now and continue antiplatelet therapy alone.  TEE done today shows no definite evidence of clot or vegetation.  Trivial right  to left shunt is noted which is likely of no clinical significance.  Recommend only single agent therapy with Plavix 75 mg if safe enough bleeding standpoint.  Stroke team will sign off.  Kindly call for questions.  Greater than 50% time during this 35-minute visit was spent in counseling and coordination of care and discussion with patient and care team and answering questions about her embolic stroke and stroke prevention.  Patient given written information about sleep smart study to review and decide if she wants to participate. Ashley Contras, MD To contact Stroke Continuity provider, please refer to [email or web address] After hours, contact General Neurology

## 2021-07-24 ENCOUNTER — Encounter (HOSPITAL_COMMUNITY): Payer: Self-pay | Admitting: Cardiology

## 2021-07-24 DIAGNOSIS — I420 Dilated cardiomyopathy: Secondary | ICD-10-CM | POA: Diagnosis not present

## 2021-07-24 DIAGNOSIS — N179 Acute kidney failure, unspecified: Secondary | ICD-10-CM

## 2021-07-24 DIAGNOSIS — B20 Human immunodeficiency virus [HIV] disease: Secondary | ICD-10-CM | POA: Diagnosis not present

## 2021-07-24 DIAGNOSIS — I639 Cerebral infarction, unspecified: Secondary | ICD-10-CM | POA: Diagnosis not present

## 2021-07-24 DIAGNOSIS — E875 Hyperkalemia: Secondary | ICD-10-CM

## 2021-07-24 LAB — POTASSIUM
Potassium: 7.5 mmol/L (ref 3.5–5.1)
Potassium: 6.5 mmol/L (ref 3.5–5.1)
Potassium: 5.5 mmol/L — ABNORMAL HIGH (ref 3.5–5.1)
Potassium: 5.2 mmol/L — ABNORMAL HIGH (ref 3.5–5.1)

## 2021-07-24 LAB — CBC
HCT: 30.4 % — ABNORMAL LOW (ref 36.0–46.0)
Hemoglobin: 9.2 g/dL — ABNORMAL LOW (ref 12.0–15.0)
MCH: 21.7 pg — ABNORMAL LOW (ref 26.0–34.0)
MCHC: 30.3 g/dL (ref 30.0–36.0)
MCV: 71.9 fL — ABNORMAL LOW (ref 80.0–100.0)
Platelets: 469 10*3/uL — ABNORMAL HIGH (ref 150–400)
RBC: 4.23 MIL/uL (ref 3.87–5.11)
RDW: 17.9 % — ABNORMAL HIGH (ref 11.5–15.5)
WBC: 6.6 10*3/uL (ref 4.0–10.5)
nRBC: 0 % (ref 0.0–0.2)

## 2021-07-24 LAB — GLUCOSE, CAPILLARY
Glucose-Capillary: 139 mg/dL — ABNORMAL HIGH (ref 70–99)
Glucose-Capillary: 149 mg/dL — ABNORMAL HIGH (ref 70–99)
Glucose-Capillary: 111 mg/dL — ABNORMAL HIGH (ref 70–99)
Glucose-Capillary: 189 mg/dL — ABNORMAL HIGH (ref 70–99)

## 2021-07-24 LAB — COMPREHENSIVE METABOLIC PANEL
ALT: 19 U/L (ref 0–44)
AST: 20 U/L (ref 15–41)
Albumin: 2.7 g/dL — ABNORMAL LOW (ref 3.5–5.0)
Alkaline Phosphatase: 132 U/L — ABNORMAL HIGH (ref 38–126)
Anion gap: 4 — ABNORMAL LOW (ref 5–15)
BUN: 17 mg/dL (ref 6–20)
CO2: 19 mmol/L — ABNORMAL LOW (ref 22–32)
Calcium: 8.8 mg/dL — ABNORMAL LOW (ref 8.9–10.3)
Chloride: 112 mmol/L — ABNORMAL HIGH (ref 98–111)
Creatinine, Ser: 2.75 mg/dL — ABNORMAL HIGH (ref 0.44–1.00)
GFR, Estimated: 20 mL/min — ABNORMAL LOW (ref >60)
Glucose, Bld: 143 mg/dL — ABNORMAL HIGH (ref 70–99)
Potassium: 6.7 mmol/L (ref 3.5–5.1)
Sodium: 135 mmol/L (ref 135–145)
Total Bilirubin: 0.2 mg/dL — ABNORMAL LOW (ref 0.3–1.2)
Total Protein: 6.9 g/dL (ref 6.5–8.1)

## 2021-07-24 MED ORDER — SODIUM ZIRCONIUM CYCLOSILICATE 10 G PO PACK
10.0000 g | PACK | Freq: Once | ORAL | Status: AC
  Administered 2021-07-24: 10 g via ORAL
  Filled 2021-07-24: qty 1

## 2021-07-24 MED ORDER — CALCIUM GLUCONATE-NACL 1-0.675 GM/50ML-% IV SOLN
1.0000 g | Freq: Once | INTRAVENOUS | Status: AC
  Administered 2021-07-24: 1000 mg via INTRAVENOUS
  Filled 2021-07-24: qty 50

## 2021-07-24 MED ORDER — SODIUM ZIRCONIUM CYCLOSILICATE 5 G PO PACK
5.0000 g | PACK | Freq: Once | ORAL | Status: AC
Start: 2021-07-24 — End: 2021-07-24
  Administered 2021-07-24: 5 g via ORAL
  Filled 2021-07-24: qty 1

## 2021-07-24 MED ORDER — CIPROFLOXACIN HCL 500 MG PO TABS
750.0000 mg | ORAL_TABLET | Freq: Every day | ORAL | Status: DC
  Administered 2021-07-25 – 2021-07-26 (×2): 750 mg via ORAL
  Filled 2021-07-24 (×2): qty 2

## 2021-07-24 MED ORDER — INSULIN ASPART 100 UNIT/ML IV SOLN
5.0000 [IU] | Freq: Once | INTRAVENOUS | Status: AC
  Administered 2021-07-24: 5 [IU] via INTRAVENOUS

## 2021-07-24 MED ORDER — CALCIUM GLUCONATE-NACL 1-0.675 GM/50ML-% IV SOLN
1.0000 g | Freq: Once | INTRAVENOUS | Status: AC
Start: 2021-07-24 — End: 2021-07-24
  Administered 2021-07-24: 1000 mg via INTRAVENOUS
  Filled 2021-07-24: qty 50

## 2021-07-24 MED ORDER — ALBUTEROL SULFATE (2.5 MG/3ML) 0.083% IN NEBU
10.0000 mg | INHALATION_SOLUTION | Freq: Once | RESPIRATORY_TRACT | Status: AC
Start: 2021-07-24 — End: 2021-07-24
  Administered 2021-07-24: 10 mg via RESPIRATORY_TRACT
  Filled 2021-07-24 (×2): qty 12

## 2021-07-24 MED ORDER — CARVEDILOL 6.25 MG PO TABS
6.2500 mg | ORAL_TABLET | Freq: Two times a day (BID) | ORAL | Status: DC
  Administered 2021-07-25: 6.25 mg via ORAL
  Filled 2021-07-24 (×2): qty 1

## 2021-07-24 MED ORDER — DEXTROSE 50 % IV SOLN
1.0000 | Freq: Once | INTRAVENOUS | Status: AC
Start: 2021-07-24 — End: 2021-07-24
  Administered 2021-07-24: 50 mL via INTRAVENOUS
  Filled 2021-07-24: qty 50

## 2021-07-24 MED ORDER — LACTATED RINGERS IV BOLUS
250.0000 mL | Freq: Once | INTRAVENOUS | Status: AC
  Administered 2021-07-24: 250 mL via INTRAVENOUS

## 2021-07-24 NOTE — Progress Notes (Addendum)
Progress Note  Patient Name: Ashley Vaughn Date of Encounter: 07/24/2021  [facility] Cardiologist: None   Subjective   Denies any chest pain, shortness of breath or palpitations.  TEE yesterday showed possible small PFO with right to left shunting by agitated contrast saline  Inpatient Medications    Scheduled Meds:  aspirin EC  81 mg Oral Daily   ciprofloxacin  750 mg Oral BID   clopidogrel  75 mg Oral Daily   DULoxetine  60 mg Oral Daily   emtricitabine-rilpivir-tenofovir AF  1 tablet Oral Q breakfast   heparin  5,000 Units Subcutaneous Q8H   insulin aspart  0-15 Units Subcutaneous TID WC   insulin aspart  0-5 Units Subcutaneous QHS   insulin glargine-yfgn  20 Units Subcutaneous Daily   metroNIDAZOLE  500 mg Oral Q12H   rosuvastatin  20 mg Oral Daily   sodium chloride flush  5 mL Intracatheter Q8H   Continuous Infusions:  calcium gluconate     PRN Meds: acetaminophen, LORazepam, polyethylene glycol   Vital Signs    Vitals:   07/23/21 2329 07/24/21 0121 07/24/21 0417 07/24/21 0750  BP: 107/68 93/66 105/62 108/68  Pulse: 90  88 95  Resp: 16  18 16   Temp: 98.2 F (36.8 C)  98.1 F (36.7 C) 98.8 F (37.1 C)  TempSrc: Oral  Oral Oral  SpO2: 100%  99% 99%  Weight:      Height:        Intake/Output Summary (Last 24 hours) at 07/24/2021 0830 Last data filed at 07/24/2021 0603 Gross per 24 hour  Intake 392.37 ml  Output --  Net 392.37 ml       07/19/2021    4:17 PM 07/18/2021    8:30 AM 05/09/2021    2:47 PM  Last 3 Weights  Weight (lbs) 140 lb 141 lb 143 lb 9.6 oz  Weight (kg) 63.504 kg 63.957 kg 65.137 kg      Telemetry    Normal sinus rhythm- Personally Reviewed  ECG    Normal sinus rhythm with nonspecific T wave abnormality and prolonged QTc- Personally Reviewed  Physical Exam  GEN: Well nourished, well developed in no acute distress HEENT: Normal NECK: No JVD; No carotid bruits LYMPHATICS: No lymphadenopathy CARDIAC:RRR, no murmurs, rubs,  gallops RESPIRATORY:  Clear to auscultation without rales, wheezing or rhonchi  ABDOMEN: Soft, non-tender, non-distended MUSCULOSKELETAL:  No edema; No deformity  SKIN: Warm and dry NEUROLOGIC:  Alert and oriented x 3 PSYCHIATRIC:  Normal affect   Labs    High Sensitivity Troponin:   Recent Labs  Lab 07/18/21 0010 07/18/21 0317  TROPONINIHS 10 16      Chemistry Recent Labs  Lab 07/18/21 1831 07/19/21 0359 07/20/21 0347 07/22/21 0232 07/23/21 0643 07/24/21 0323 07/24/21 0624  NA 140 140   < > 137 138 135  --   K 3.2* 2.9*   < > 5.0 5.0 6.7* 7.5*  CL 108 107   < > 107 108 112*  --   CO2 22 26   < > 24 20* 19*  --   GLUCOSE 324* 257*   < > 85 82 143*  --   BUN 9 6   < > 8 9 17   --   CREATININE 0.89 0.81   < > 1.22* 1.43* 2.75*  --   CALCIUM 8.7* 8.6*   < > 8.8* 9.0 8.8*  --   MG 1.9 1.9  --   --   --   --   --  PROT  --  6.5   < > 6.5 6.9 6.9  --   ALBUMIN  --  2.3*   < > 2.3* 2.6* 2.7*  --   AST  --  17   < > 26 20 20   --   ALT  --  16   < > 24 22 19   --   ALKPHOS  --  116   < > 142* 123 132*  --   BILITOT  --  0.2*   < > 0.3 0.5 0.2*  --   GFRNONAA >60 >60   < > 52* 43* 20*  --   ANIONGAP 10 7   < > 6 10 4*  --    < > = values in this interval not displayed.     Lipids  Recent Labs  Lab 07/19/21 0359  CHOL 148  TRIG 118  HDL 34*  LDLCALC 90  CHOLHDL 4.4     Hematology Recent Labs  Lab 07/22/21 0232 07/23/21 0331 07/24/21 0323  WBC 5.2 5.6 6.6  RBC 3.77* 4.24 4.23  HGB 8.2* 9.4* 9.2*  HCT 26.2* 30.2* 30.4*  MCV 69.5* 71.2* 71.9*  MCH 21.8* 22.2* 21.7*  MCHC 31.3 31.1 30.3  RDW 17.1* 17.8* 17.9*  PLT 428* 472* 469*    Thyroid No results for input(s): TSH, FREET4 in the last 168 hours.  BNPNo results for input(s): BNP, PROBNP in the last 168 hours.  DDimer No results for input(s): DDIMER in the last 168 hours.   Radiology    ECHO TEE  Result Date: 07/23/2021    TRANSESOPHOGEAL ECHO REPORT   Patient Name:   Ashley Vaughn Date of Exam:  07/23/2021 Medical Rec #:  [id number]   Height:       62.0 in Accession #:    [id number]  Weight:       140.0 lb Date of Birth:  [birth year]   BSA:          1.643 m Patient Age:    13 years    BP:           101/68 mmHg Patient Gender: F           HR:           69 bpm. Exam Location:  Inpatient Procedure: Transesophageal Echo, Color Doppler and Saline Contrast Bubble Study Indications:     Stroke  History:         Patient has prior history of Echocardiogram examinations, most                  recent 07/21/2021. Cardiomyopathy; Risk Factors:Hypertension.                  CVA, HIV.  Sonographer:     Clayton Lefort RDCS (AE) Referring Phys:  [id number] Erma Heritage Diagnosing Phys: Buford Dresser MD PROCEDURE: After discussion of the risks and benefits of a TEE, an informed consent was obtained from the patient. The transesophogeal probe was passed without difficulty through the esophogus of the patient. Sedation performed by different physician. The patient was monitored while under deep sedation. Anesthestetic sedation was provided intravenously by Anesthesiology: 158.43mg  of Propofol. Image quality was good. The patient developed no complications during the procedure. IMPRESSIONS  1. Left ventricular ejection fraction, by estimation, is 30 to 35%. The left ventricle has moderately decreased function. The left ventricle demonstrates global hypokinesis.  2. Right ventricular systolic function is normal. The right ventricular size is normal.  3. No left atrial/left atrial appendage thrombus was detected.  4. The mitral valve is normal in structure. Trivial mitral valve regurgitation. No evidence of mitral stenosis.  5. The aortic valve is tricuspid. Aortic valve regurgitation is not visualized. No aortic stenosis is present.  6. There is mild (Grade II) plaque involving the descending aorta.  7. Cannot exclude a small PFO. trivial right to left shunt. Conclusion(s)/Recommendation(s): No evidence of PFO by color doppler.  Agitated saline injection shows trivial bubble within 3 beats, consistent with possible small right to left intracardiac shunt. FINDINGS  Left Ventricle: Left ventricular ejection fraction, by estimation, is 30 to 35%. The left ventricle has moderately decreased function. The left ventricle demonstrates global hypokinesis. The left ventricular internal cavity size was normal in size. Right Ventricle: The right ventricular size is normal. No increase in right ventricular wall thickness. Right ventricular systolic function is normal. Left Atrium: Left atrial size was normal in size. No left atrial/left atrial appendage thrombus was detected. Right Atrium: Right atrial size was normal in size. Prominent Eustachian valve. Pericardium: Trivial pericardial effusion is present. Mitral Valve: The mitral valve is normal in structure. Trivial mitral valve regurgitation. No evidence of mitral valve stenosis. There is no evidence of mitral valve vegetation. Tricuspid Valve: The tricuspid valve is normal in structure. Tricuspid valve regurgitation is trivial. No evidence of tricuspid stenosis. There is no evidence of tricuspid valve vegetation. Aortic Valve: The aortic valve is tricuspid. Aortic valve regurgitation is not visualized. No aortic stenosis is present. There is no evidence of aortic valve vegetation. Pulmonic Valve: The pulmonic valve was grossly normal. Pulmonic valve regurgitation is not visualized. No evidence of pulmonic stenosis. There is no evidence of pulmonic valve vegetation. Aorta: The aortic root and ascending aorta are structurally normal, with no evidence of dilitation. There is mild (Grade II) plaque involving the descending aorta. IAS/Shunts: Cannot exclude a small PFO. Agitated saline contrast was given intravenously to evaluate for intracardiac shunting. Trivial right to left shunt. Buford Dresser MD Electronically signed by Buford Dresser MD Signature Date/Time: 07/23/2021/2:44:52 PM     Final     Cardiac Studies   ECHOCARDIOGRAM LIMITED 08-03-21 1. No evidence of left ventricular thrombus with Definity contrast. Left ventricular ejection fraction, by estimation, is 30 to 35%. The left ventricle has moderately decreased function. The left ventricle demonstrates global hypokinesis. Conclusion(s)/Recommendation(s): Limited study for LV thrombus.  Patient Profile     56 y.o. female with history of poorly controlled diabetes mellitus and hypertension, chronic HIV infection on antiretroviral therapies, anemia, intra-abdominal abscess following recent appendectomy, requiring percutaneous drain, admitted with seizure and found to have evidence of acute ischemic infarct scattered in the subcortical right cerebral hemisphere, suspicious for embolic event.  Noted to have severely depressed left ventricular systolic function on echocardiography, but without clinically evident heart failure.  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  Assessment & Plan    Cardiomyopathy/systolic HF:  -Appears well compensated hemodynamically/euvolemic.  No symptoms of left heart failure.   -Etiology uncertain, but suspect insufficiently treated hypertension may have a lot to do with it.   -Has multiple risk factors for CAD (including uncontrolled diabetes mellitus), but does not have angina or ECG changes suggestive of ischemia.  -Carvedilol currently on hold due to soft BP yesterday and overnight -Unfortunately serum creatinine jumped to 2.75 today so will hold losartan and Jardiance -Coronary CTA canceled due to  bump in serum creatinine -Ischemic work-up can preformed as an outpatient once her other noncardiac issues have improved  Embolic stroke:  -TEE yesterday showed possible small PFO with right to left shunting  HIV:  -On antiretroviral therapy.   -Undetectable viral load as of March 2023.  Normal  CD4 count 1447.  Anemia:  -Microcytic, but with normal iron stores by labs.   -Microcytosis is a chronic finding dating back more than 10 years, consistent with thalassemia trait, but baseline hemoglobin seems to be around 12.  DM:  -poorly controlled -treatment per [facility]  Appendicitis  -complicated by retrocecal abscess with indwelling drain  7.  Hyperkalemia -Potassium 7.5 this morning in the setting of AKI -Per [facility]>> given calcium gluconate IV this a.m. as well as 20 units of insulin -Stop ARB  8. AKI -Serum creatinine has bumped from 1.22-2.75 today>> question of related to mild hypotension during TEE sedation yesterday -stop diuretics, ARB and Jardiance -Further work-up per [facility]  I have spent a total of 35 minutes with patient reviewing 2D echo , telemetry, EKGs, labs and examining patient as well as establishing an assessment and plan that was discussed with the patient.  > 50% of time was spent in direct patient care.        For questions or updates, please contact [place] Please consult [email or web address] for contact info under        Signed, Fransico Him, MD  07/24/2021, 8:30 AM

## 2021-07-24 NOTE — Progress Notes (Signed)
Received a secure chat message about hyperkalemia on morning labs at 6.7. Went to bedside to assess. Patient woken by nursing at bedside to obtain ekg. Prior to this patient had been resting comfortably. Denied any chest pain, shortness of breath, or palpitations.  Exam:  Hemodynamically stable with normal vital signs XYB:FXOVAN aged woman resting comfortably in bed CV: RRR no m/r/g Resp: breathing comfortably on room air Neuro: Alert oriented, answering questions appropriately  Assessment/Plan EKG obtained at bedside does not show any obvious changes related to hyperkalemia. T waves are taller than prior EKG on admission in leads V1, V2 however do not meet criteria for peaked T waves. Upon chart review of medications patient had standing order for BID potassium 40 which I have discontinued. Potassium  had been on the upper side of normal for the last few days. Will order a dose of lokelma as well as IV calcium gluconate given the K is >6.5 and then recheck the potassium with repeat BMP.

## 2021-07-24 NOTE — Plan of Care (Signed)
  Problem: Education: Goal: Ability to describe self-care measures that may prevent or decrease complications (Diabetes Survival Skills Education) will improve Outcome: Progressing Goal: Individualized Educational Video(s) Outcome: Progressing   Problem: Coping: Goal: Ability to adjust to condition or change in health will improve Outcome: Progressing   Problem: Fluid Volume: Goal: Ability to maintain a balanced intake and output will improve Outcome: Progressing   Problem: Health Behavior/Discharge Planning: Goal: Ability to identify and utilize available resources and services will improve Outcome: Progressing Goal: Ability to manage health-related needs will improve Outcome: Progressing   Problem: Metabolic: Goal: Ability to maintain appropriate glucose levels will improve Outcome: Progressing   Problem: Nutritional: Goal: Maintenance of adequate nutrition will improve Outcome: Progressing Goal: Progress toward achieving an optimal weight will improve Outcome: Progressing   Problem: Skin Integrity: Goal: Risk for impaired skin integrity will decrease Outcome: Progressing   Problem: Tissue Perfusion: Goal: Adequacy of tissue perfusion will improve Outcome: Progressing   Problem: Health Behavior/Discharge Planning: Goal: Ability to manage health-related needs will improve Outcome: Progressing   Problem: Clinical Measurements: Goal: Ability to maintain clinical measurements within normal limits will improve Outcome: Progressing Goal: Will remain free from infection Outcome: Progressing Goal: Diagnostic test results will improve Outcome: Progressing Goal: Respiratory complications will improve Outcome: Progressing Goal: Cardiovascular complication will be avoided Outcome: Progressing   Problem: Activity: Goal: Risk for activity intolerance will decrease Outcome: Progressing   Problem: Safety: Goal: Ability to remain free from injury will improve Outcome:  Progressing   Problem: Education: Goal: Ability to demonstrate management of disease process will improve Outcome: Progressing Goal: Ability to verbalize understanding of medication therapies will improve Outcome: Progressing Goal: Individualized Educational Video(s) Outcome: Progressing   Problem: Activity: Goal: Capacity to carry out activities will improve Outcome: Progressing   Problem: Cardiac: Goal: Ability to achieve and maintain adequate cardiopulmonary perfusion will improve Outcome: Progressing   

## 2021-07-24 NOTE — Progress Notes (Signed)
Subjective: Critical lab report of hyperkalemia at 7.5.  Patient had one episode of emesis this morning with her morning medicine. No hematemesis or abdominal pain. She has not had her breakfast yet due to lack of appetite after her emesis. Otherwise denies any chest pain, palpitations, shortness of breath.   Objective:  Vital signs in last 24 hours: Vitals:   07/23/21 2329 07/24/21 0121 07/24/21 0417 07/24/21 0750  BP: 107/68 93/66 105/62 108/68  Pulse: 90  88 95  Resp: 16  18 16   Temp: 98.2 F (36.8 C)  98.1 F (36.7 C) 98.8 F (37.1 C)  TempSrc: Oral  Oral Oral  SpO2: 100%  99% 99%  Weight:      Height:       Weight change:  N/A  Intake/Output Summary (Last 24 hours) at 07/24/2021 1034 Last data filed at 07/24/2021 0603 Gross per 24 hour  Intake 392.37 ml  Output --  Net 392.37 ml    Physical Exam  Constitutional:  Alert and oriented x3. In no acute distress.  Cardiovascular:  Regular rate and rhythm. No swelling to the lower extremities. Extremities are warm to touch.  Respiratory:  No increased work of breathing or accessory muscle ues. Lungs are clear to auscultation.  Abdominal: No abdominal tenderness to palpation. Normal bowel sounds present. No organomegaly.    Extremities: No asymmetry noted to the lower extremities Skin:  No obvious lesion noted Psych:  Normal mood and behavior.      Latest Ref Rng & Units 07/24/2021    3:23 AM 07/23/2021    3:31 AM 07/22/2021    2:32 AM  CBC  WBC 4.0 - 10.5 K/uL 6.6   5.6   5.2    Hemoglobin 12.0 - 15.0 g/dL 9.2   9.4   8.2    Hematocrit 36.0 - 46.0 % 30.4   30.2   26.2    Platelets 150 - 400 K/uL 469   472   428         Latest Ref Rng & Units 07/24/2021    8:08 AM 07/24/2021    6:24 AM 07/24/2021    3:23 AM  CMP  Glucose 70 - 99 mg/dL   09/23/2021    BUN 6 - 20 mg/dL   17    Creatinine 017 - 1.00 mg/dL   5.10    Sodium 2.58 - 145 mmol/L   135    Potassium 3.5 - 5.1 mmol/L 6.5   7.5   6.7    Chloride 98 - 111 mmol/L   112     CO2 22 - 32 mmol/L   19    Calcium 8.9 - 10.3 mg/dL   8.8    Total Protein 6.5 - 8.1 g/dL   6.9    Total Bilirubin 0.3 - 1.2 mg/dL   0.2    Alkaline Phos 38 - 126 U/L   132    AST 15 - 41 U/L   20    ALT 0 - 44 U/L   19      Assessment/Plan:  Principal Problem:   Seizure (HCC) Active Problems:   Human immunodeficiency virus (HIV) disease (HCC)   Postprocedural intraabdominal abscess   CVA (cerebral vascular accident) (HCC)   Cardiomyopathy (HCC)   Patient is a 56 y.o. 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   Hyperkalemia  Acute kidney injury Patient had critical lab report of potassium elevated at 7.5, up from 5.0 yesterday. She had been receiving potassium supplement 40 mEq BID after being noted to be hypokalemic earlier in her admission. She has also been on spironolactone, losartan, and Jardiance. Acute kidney injury noted as well with creatinine 2.75, up from 1.43 and GFR 20, down from 43. EKG shows wide T waves and prolonged PR interval. Etiology of sudden hyperkalemia is likely multifactorial but possibly exacerbated by her AKI. She did have TEE yesterday with mild hypotension during sedation which may have contributed to her AKI. Patient started on IV calcium gluconate 1g x2. Repeat potassium check improved to 6.5. She has received 5 units of novolog with 1 ampule of dextrose 50. Also receiving her regular Semglee and SSI. Patient has also received one time albuterol nebulizer with improvement to 5.5.  -Provided with Lokelma and recheck potassium in the evening.  -Trend BMP for improvement in kidney function -Continue to monitor and provide additional novolog and dextrose 50 as needed for persistent hyperkalemia  -Discontinue spironolactone, losartan, and Jardiance at this time with hyperkalemia.  -Continue to monitor closely   Embolic CVA TEE yesterday  showed no LV thrombus however did show possible small PFO with right to left shunting by agitated contrast saline. She does have 60% right ICA origin stenosis,  moderate on the left. She is on aspirin 81 mg daily and Plavix 75 mg daily for DAPT. No focal weakness noted.  -Continue DAPT -Continue Crestor 20 mg daily -Continue to monitor  HFrEF (30-35%) Patient remains euvolemic with no shortness of breath or orthopnea. Patient scheduled for ischemic evaluation with CTA today with cardiology. Due to AKI with hyperkalemia, CTA has been canceled. Cardiology will plan to see patient outpatient for further ischemic evaluation. Her carvedilol was held due to low blood pressure. Blood pressure is stable this morning at 108/68 -Will restart on low dose carvedilol 6.25 mg BID -Hold losartan, Jardiance, and spironolactone due to severe hyperkalemia and AKI.  -Will continue to monitor and restart GDMT once hyperkalemia has resolved.   HIV Patient is on odefsey at home which has been continued during this admission. Undetectable viral load as of March 2023.  -Will hold today's dose with GFR less than 20.  -Will restart tomorrow if GFR improves to at least greater than 30.   HTN Blood pressure stable at 108/68. Carvedilol was held due to hypotension.  -Holding losartan, Jardiance, spironolactone due to hyperkalemia and AKI.  -Will restart carvedilol low dose at 6.25 mg BID -Continue to monitor  T2DM Patient on Semglee and SSI. Also received 5 units of insulin with dextrose 50 for hyperkalemia. Blood glucose stable at 139 today.  -Continue Semglee and SSI -Continue to monitor  Abdominal abscess Patient is afebrile and without leukocytosis. Currently on ciprofloxacin and metronidazole, per ID recommendation -Will adjust ciprofloxacin to 750 mg QD due to AKI. Continue metronidazole.  -Continue to monitor   LOS: 6 days   Ezzard Flax, Medical Student 07/24/2021, 10:34 AM  Pager number:  [phone]    Attestation for Student Documentation:  I personally was present and performed or re-performed the history, physical exam and medical decision-making activities of this service and have verified that the service and findings are accurately documented in the student's note.  Champ Mungo, DO 07/24/2021, 1:51 PM

## 2021-07-25 ENCOUNTER — Inpatient Hospital Stay (HOSPITAL_COMMUNITY): Payer: Medicaid Other

## 2021-07-25 DIAGNOSIS — E875 Hyperkalemia: Secondary | ICD-10-CM | POA: Diagnosis not present

## 2021-07-25 DIAGNOSIS — I420 Dilated cardiomyopathy: Secondary | ICD-10-CM | POA: Diagnosis not present

## 2021-07-25 DIAGNOSIS — B20 Human immunodeficiency virus [HIV] disease: Secondary | ICD-10-CM | POA: Diagnosis not present

## 2021-07-25 DIAGNOSIS — I639 Cerebral infarction, unspecified: Secondary | ICD-10-CM | POA: Diagnosis not present

## 2021-07-25 LAB — COMPREHENSIVE METABOLIC PANEL
ALT: 19 U/L (ref 0–44)
AST: 22 U/L (ref 15–41)
Albumin: 2.7 g/dL — ABNORMAL LOW (ref 3.5–5.0)
Alkaline Phosphatase: 112 U/L (ref 38–126)
Anion gap: 10 (ref 5–15)
BUN: 16 mg/dL (ref 6–20)
CO2: 19 mmol/L — ABNORMAL LOW (ref 22–32)
Calcium: 9.3 mg/dL (ref 8.9–10.3)
Chloride: 107 mmol/L (ref 98–111)
Creatinine, Ser: 3.41 mg/dL — ABNORMAL HIGH (ref 0.44–1.00)
GFR, Estimated: 15 mL/min — ABNORMAL LOW (ref >60)
Glucose, Bld: 129 mg/dL — ABNORMAL HIGH (ref 70–99)
Potassium: 4.9 mmol/L (ref 3.5–5.1)
Sodium: 136 mmol/L (ref 135–145)
Total Bilirubin: 0.4 mg/dL (ref 0.3–1.2)
Total Protein: 6.8 g/dL (ref 6.5–8.1)

## 2021-07-25 LAB — BASIC METABOLIC PANEL
Anion gap: 9 (ref 5–15)
BUN: 18 mg/dL (ref 6–20)
CO2: 20 mmol/L — ABNORMAL LOW (ref 22–32)
Calcium: 9.2 mg/dL (ref 8.9–10.3)
Chloride: 108 mmol/L (ref 98–111)
Creatinine, Ser: 3.5 mg/dL — ABNORMAL HIGH (ref 0.44–1.00)
GFR, Estimated: 15 mL/min — ABNORMAL LOW (ref >60)
Glucose, Bld: 180 mg/dL — ABNORMAL HIGH (ref 70–99)
Potassium: 4.9 mmol/L (ref 3.5–5.1)
Sodium: 137 mmol/L (ref 135–145)

## 2021-07-25 LAB — CBC
HCT: 29.3 % — ABNORMAL LOW (ref 36.0–46.0)
Hemoglobin: 9 g/dL — ABNORMAL LOW (ref 12.0–15.0)
MCH: 21.8 pg — ABNORMAL LOW (ref 26.0–34.0)
MCHC: 30.7 g/dL (ref 30.0–36.0)
MCV: 71.1 fL — ABNORMAL LOW (ref 80.0–100.0)
Platelets: 464 10*3/uL — ABNORMAL HIGH (ref 150–400)
RBC: 4.12 MIL/uL (ref 3.87–5.11)
RDW: 18.2 % — ABNORMAL HIGH (ref 11.5–15.5)
WBC: 5.8 10*3/uL (ref 4.0–10.5)
nRBC: 0 % (ref 0.0–0.2)

## 2021-07-25 LAB — GLUCOSE, CAPILLARY
Glucose-Capillary: 132 mg/dL — ABNORMAL HIGH (ref 70–99)
Glucose-Capillary: 107 mg/dL — ABNORMAL HIGH (ref 70–99)
Glucose-Capillary: 175 mg/dL — ABNORMAL HIGH (ref 70–99)
Glucose-Capillary: 108 mg/dL — ABNORMAL HIGH (ref 70–99)

## 2021-07-25 MED ORDER — LACTATED RINGERS IV SOLN: INTRAVENOUS | Status: DC

## 2021-07-25 NOTE — Evaluation (Signed)
Physical Therapy Evaluation Patient Details Name: Ashley Vaughn MRN: [id number] DOB: [birth year] Today's Date: 07/25/2021  History of Present Illness  Pt is a 56 y.o. female who presented 07/18/21 due to seizure-like activity. Of note, pt seen in ED 07/17/21 for syncope episode preceded by nausea, vomiting, and shaking, and workup was unrevealing and pt discharged home. MRI shows scattered acute ischemic infarcts involving the subcortical right cerebral hemisphere. EEG-independent bilateral temporal slowing, no seizure. S/p RLQ drain exchange/upsize 6/1, TEE 6/5. PMH: HTN, DM2, HIV, HSV, recent appendectomy complicated by intra-abdominal abscess requiring percutaneous drainage and antibiotics   Clinical Impression  Pt presents with condition above and deficits mentioned below, see PT Problem List. PTA, she was IND, recently began living with her daughter in a 1-level house with 3 STE. Currently, pt is lethargic from PT waking her, potentially impacting her cognition and balance. Pt did require up to minA for gait and stairs without UE support due to incoordination and balance deficits. Pt also disoriented to date and situation, but able to path-find her way back to her room without assistance. Currently, recommending a RW and HHPT due to pt's risk for falls, but pt may progress to a level she does not need either by discharge. Will continue to follow acutely.       Recommendations for follow up therapy are one component of a multi-disciplinary discharge planning process, led by the attending physician.  Recommendations may be updated based on patient status, additional functional criteria and insurance authorization.  Follow Up Recommendations Home health PT (vs no PT pending progress)    Assistance Recommended at Discharge Intermittent Supervision/Assistance  Patient can return home with the following  A little help with walking and/or transfers;Assistance with cooking/housework;Assist for  transportation;Help with stairs or ramp for entrance    Equipment Recommendations Rolling walker (2 wheels)  Recommendations for Other Services  OT consult    Functional Status Assessment Patient has had a recent decline in their functional status and demonstrates the ability to make significant improvements in function in a reasonable and predictable amount of time.     Precautions / Restrictions Precautions Precautions: Fall Restrictions Weight Bearing Restrictions: No      Mobility  Bed Mobility Overal bed mobility: Needs Assistance Bed Mobility: Supine to Sit, Sit to Supine     Supine to sit: Supervision, HOB elevated Sit to supine: Supervision, HOB elevated   General bed mobility comments: Supervision for safety, extra time due to lethargy, HOB elevated.    Transfers Overall transfer level: Needs assistance Equipment used: None Transfers: Sit to/from Stand Sit to Stand: Min guard           General transfer comment: Min guard for safety, minor trunk sway noted    Ambulation/Gait Ambulation/Gait assistance: Min guard, Min assist Gait Distance (Feet): 220 Feet Assistive device: None Gait Pattern/deviations: Step-through pattern, Decreased stride length, Narrow base of support, Scissoring Gait velocity: reduced Gait velocity interpretation: <1.8 ft/sec, indicate of risk for recurrent falls   General Gait Details: Pt with occasional trunk sway and narrow placement of feet, resulting in scissoring gait pattern occasionally. MinA intermittently to prevent LOB, otherwise min guard.  Stairs Stairs: Yes Stairs assistance: Min assist, Min guard Stair Management: No rails, Step to pattern, Forwards Number of Stairs: 2 General stair comments: Ascends with min guard without rails, descends with light minA for safety due to trunk sway.  Wheelchair Mobility    Modified Rankin (Stroke Patients Only) Modified Rankin (Stroke Patients Only) Pre-Morbid Rankin  Score: No  symptoms Modified Rankin: Moderately severe disability     Balance Overall balance assessment: Mild deficits observed, not formally tested                                           Pertinent Vitals/Pain Pain Assessment Pain Assessment: Faces Faces Pain Scale: No hurt Pain Intervention(s): Monitored during session    Home Living Family/patient expects to be discharged to:: Private residence Living Arrangements: Children (daughter) Available Help at Discharge: Family;Available 24 hours/day (daughter has children she has to care for) Type of Home: House Home Access: Stairs to enter Entrance Stairs-Rails: None Entrance Stairs-Number of Steps: 3   Home Layout: One level Home Equipment: Grab bars - tub/shower;Grab bars - toilet Additional Comments: Reports she just started living with her daughter and is unclear of the plan moving forward    Prior Function Prior Level of Function : Independent/Modified Independent             Mobility Comments: Pt reports no use of AD. ADLs Comments: Daughter drives.     Hand Dominance   Dominant Hand: Right    Extremity/Trunk Assessment   Upper Extremity Assessment Upper Extremity Assessment: Defer to OT evaluation    Lower Extremity Assessment Lower Extremity Assessment: RLE deficits/detail;LLE deficits/detail RLE Deficits / Details: MMT scores of 4+ to 5 grossly bil; denies numbness/tingling bil; gross incoordination RLE Sensation: WNL RLE Coordination: decreased gross motor LLE Deficits / Details: MMT scores of 4+ to 5 grossly bil; denies numbness/tingling bil; gross incoordination LLE Sensation: WNL LLE Coordination: decreased gross motor    Cervical / Trunk Assessment Cervical / Trunk Assessment: Normal  Communication   Communication: No difficulties  Cognition Arousal/Alertness: Lethargic Behavior During Therapy: Flat affect Overall Cognitive Status: Impaired/Different from baseline Area of  Impairment: Orientation, Safety/judgement                 Orientation Level: Disoriented to, Time, Situation       Safety/Judgement: Decreased awareness of deficits, Decreased awareness of safety     General Comments: Pt reporting it was "May 20 something", also reporting she was in the hospital for her appendix. Re-oriented pt. Pt may have been a little groggy due to just being awakened by PT. Seemed unaware or denying her balance deficits.        General Comments General comments (skin integrity, edema, etc.): educated pt on her risk for falls and potential need for AD pending progress    Exercises     Assessment/Plan    PT Assessment Patient needs continued PT services  PT Problem List Decreased activity tolerance;Decreased balance;Decreased mobility;Decreased cognition;Decreased coordination;Decreased safety awareness       PT Treatment Interventions DME instruction;Gait training;Stair training;Therapeutic activities;Functional mobility training;Therapeutic exercise;Balance training;Neuromuscular re-education;Cognitive remediation;Patient/family education    PT Goals (Current goals can be found in the Care Plan section)  Acute Rehab PT Goals Patient Stated Goal: to go home PT Goal Formulation: With patient Time For Goal Achievement: 08/08/21 Potential to Achieve Goals: Good    Frequency Min 4X/week     Co-evaluation               AM-PAC PT "6 Clicks" Mobility  Outcome Measure Help needed turning from your back to your side while in a flat bed without using bedrails?: A Little Help needed moving from lying on your back to sitting on the  side of a flat bed without using bedrails?: A Little Help needed moving to and from a bed to a chair (including a wheelchair)?: A Little Help needed standing up from a chair using your arms (e.g., wheelchair or bedside chair)?: A Little Help needed to walk in hospital room?: A Little Help needed climbing 3-5 steps with a  railing? : A Little 6 Click Score: 18    End of Session Equipment Utilized During Treatment: Gait belt Activity Tolerance: Patient tolerated treatment well Patient left: in bed;with call bell/phone within reach;with bed alarm set Nurse Communication: Mobility status PT Visit Diagnosis: Unsteadiness on feet (R26.81);Other abnormalities of gait and mobility (R26.89);Difficulty in walking, not elsewhere classified (R26.2);Other symptoms and signs involving the nervous system (Q76.195)    Time: 0932-6712 PT Time Calculation (min) (ACUTE ONLY): 16 min   Charges:   PT Evaluation $PT Eval Moderate Complexity: 1 Mod          Raymond Gurney, PT, DPT Acute Rehabilitation Services  Office: [phone]   Jewel Baize 07/25/2021, 9:18 AM

## 2021-07-25 NOTE — Plan of Care (Signed)
  Problem: Education: Goal: Ability to describe self-care measures that may prevent or decrease complications (Diabetes Survival Skills Education) will improve Outcome: Progressing Goal: Individualized Educational Video(s) Outcome: Progressing   Problem: Coping: Goal: Ability to adjust to condition or change in health will improve Outcome: Progressing   Problem: Fluid Volume: Goal: Ability to maintain a balanced intake and output will improve Outcome: Progressing   Problem: Health Behavior/Discharge Planning: Goal: Ability to identify and utilize available resources and services will improve Outcome: Progressing Goal: Ability to manage health-related needs will improve Outcome: Progressing   Problem: Metabolic: Goal: Ability to maintain appropriate glucose levels will improve Outcome: Progressing   Problem: Nutritional: Goal: Maintenance of adequate nutrition will improve Outcome: Progressing Goal: Progress toward achieving an optimal weight will improve Outcome: Progressing   Problem: Skin Integrity: Goal: Risk for impaired skin integrity will decrease Outcome: Progressing   Problem: Tissue Perfusion: Goal: Adequacy of tissue perfusion will improve Outcome: Progressing   Problem: Health Behavior/Discharge Planning: Goal: Ability to manage health-related needs will improve Outcome: Progressing   Problem: Clinical Measurements: Goal: Ability to maintain clinical measurements within normal limits will improve Outcome: Progressing Goal: Will remain free from infection Outcome: Progressing Goal: Diagnostic test results will improve Outcome: Progressing Goal: Respiratory complications will improve Outcome: Progressing Goal: Cardiovascular complication will be avoided Outcome: Progressing   Problem: Activity: Goal: Risk for activity intolerance will decrease Outcome: Progressing   Problem: Safety: Goal: Ability to remain free from injury will improve Outcome:  Progressing   Problem: Education: Goal: Ability to demonstrate management of disease process will improve Outcome: Progressing Goal: Ability to verbalize understanding of medication therapies will improve Outcome: Progressing Goal: Individualized Educational Video(s) Outcome: Progressing   Problem: Activity: Goal: Capacity to carry out activities will improve Outcome: Progressing   Problem: Cardiac: Goal: Ability to achieve and maintain adequate cardiopulmonary perfusion will improve Outcome: Progressing

## 2021-07-25 NOTE — Progress Notes (Signed)
Explained to patient about flushing and changing dressing for drain. Patient alllowed flushing but did not want the dressing change. Explains "It was already changed, you don't need to change it."

## 2021-07-25 NOTE — Plan of Care (Signed)

## 2021-07-25 NOTE — Progress Notes (Addendum)
HD#7 SUBJECTIVE:  Patient Summary: [name] is a  56 y.o. 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  Overnight Events: None  Interim History: Patient evaluated at bedside and has no concerns at this time. She is having regular bowel movements and UOP at this time. She continues to have poor appetite and PO intake though this has been going on since prior to her hospital admission.   OBJECTIVE:  Vital Signs: Vitals:   07/25/21 0000 07/25/21 0430 07/25/21 0804 07/25/21 1144  BP: 121/72 114/73 117/72 106/69  Pulse: 100 94 96 81  Resp: 18  16 16   Temp: 98.8 F (37.1 C) 98.4 F (36.9 C) 98.2 F (36.8 C) 98.1 F (36.7 C)  TempSrc: Oral Oral Oral Oral  SpO2: 99% 100% 99% 100%  Weight:      Height:       Supplemental O2: Room Air SpO2: 100 % O2 Flow Rate (L/min): 3 L/min  Filed Weights   07/19/21 1617  Weight: 63.5 kg     Intake/Output Summary (Last 24 hours) at 07/25/2021 1348 Last data filed at 07/24/2021 1909 Gross per 24 hour  Intake 245 ml  Output --  Net 245 ml   Net IO Since Admission: 3,662.37 mL [07/25/21 1348]  Physical Exam: Constitutional:Resting comfortably in bed, no acute distress. Cardio:Regular rate and rhythm. No murmurs, rubs, gallops. Pulm:Normal work of breathing on room air. Abdomen:Soft, nontender, nondistended. QF:[id number] for extremity edema. Moves all extremities spontaneously. Skin:Warm and dry. Neuro:Alert and oriented x3. No focal deficit noted. Psych:Flat affect, appropriately engaging.  Patient Lines/Drains/Airways Status     Active Line/Drains/Airways     Name Placement date Placement time Site Days   Peripheral IV 07/19/21 20 G 2.5" Anterior;Proximal;Right Forearm 07/19/21  0557  Forearm  6   Closed System Drain Right RLQ 12 Fr.  07/19/21  1544  RLQ  6             ASSESSMENT/PLAN:  Assessment: Principal Problem:   Seizure ([place]) Active Problems:   Human immunodeficiency virus (HIV) disease ([place])   Postprocedural intraabdominal abscess   CVA (cerebral vascular accident) ([place])   Cardiomyopathy ([place])   Plan: Acute kidney injury Overnight patient had worsening of AKI with creatinine up to 3.41 from 2.5. She endorses poor appetite and overall PO intake including fluids which is not new to this admission. She denies issues with urination. On exam she appears euvolemic. Renal ultrasound was obtained and did not show any abnormalities. -LR 75 cc/hr x5h -Repeat BMP at 1600; can continue gentle hydration if no improvement of renal function -F/u urine studies -Continue to hold Jardiance, spironolactone, and losartan   Embolic CVA Patient is not had any changes in neurological status since admission. Source thought to be from 60% right ICA origin stenosis, moderate on the left. Currently on aspirin 81 mg daily and Plavix 75 mg daily for DAPT.  -Continue DAPT.  -Continue Crestor 20 mg daily   HFrEF (30-35%) Patient is clinically euvolemic without dyspnea, orthopnea, edema.  -Outpatient ischemic work-up planned after discharge -Continue to hold Jardiance, spironolactone, and losartan with AKI -Continue carvedilol 6.25 mg BID -Strict I/O and daily weights  Hyperkalemia (resolved) Potassium WNL on AM labs, 4.9.  -Trend BMP   HTN BP stable. -Holding losartan, Jardiance, spironolactone 2/2 AKI -Continue carvedilol   T2DM  CBG range 107-189 over last 24h. She endorses poor appetite and resulting poor overall PO intake. -Continue Semglee and SSI   Post-appendectomy abdominal abscess Patient continues to be afebrile and without leukocytosis. She denies abdominal pain at this time. -ID following, appreciate their assistance -Continue metronidazole and adjusted dose of ciprofloxacin   HIV Most recent viral  load in March 2023 was undetectable.  -Currently holding ART due to AKI  Best Practice: Diet: Renal diet IVF: Fluids: LR, Rate:  75 cc/hr x 5 hrs VTE: heparin injection 5,000 Units Start: 07/19/21 1400 Code: Full AB: Metronidazole, ciprofloxacin Therapy Recs: Home Health DISPO: Anticipated discharge in 1-2 days to Home pending Medical stability.  Signature: Farrel Gordon, D.O.  Internal Medicine Resident, PGY-1 [facility]  Pager: [phone] 1:48 PM, 07/25/2021   Please contact the on call pager after 5 pm and on weekends at [phone].

## 2021-07-25 NOTE — Progress Notes (Signed)
Pharmacy Student rounding with the IMTS/[facility] Service. Performing daily medication reviews and  review of current laboratory data.  Emtricitabine/rilpivirine/tenofovir [facility]) is not recommended with CrCl < 30 mL/min. Pt had AKI (CrCl 20.3 mL/min). Recommend holding Odefsey due to renal decline. Will recommence once renal function improves and see an improvement in SCr and CrCl. ARB (losartan), metformin, and jardiance were already being held at that time. 07/25/2021 -- Renal function worsened (16.3 mL/min). Continue to hold metformin, jardiance, losartan, and Odefsey. Continue to monitor SCr and CrCl  Haynes Kerns, P4 [facility] Pharmacy Student

## 2021-07-25 NOTE — Evaluation (Signed)
Occupational Therapy Evaluation Patient Details Name: Ashley Vaughn MRN: [id number] DOB: 05-28-1965 Today's Date: 07/25/2021   History of Present Illness Pt is a 56 y.o. female who presented 07/18/21 due to seizure-like activity. Of note, pt seen in ED 07/17/21 for syncope episode preceded by nausea, vomiting, and shaking, and workup was unrevealing and pt discharged home. MRI shows scattered acute ischemic infarcts involving the subcortical right cerebral hemisphere. EEG-independent bilateral temporal slowing, no seizure. S/p RLQ drain exchange/upsize 6/1, TEE 6/5. PMH: HTN, DM2, HIV, HSV, recent appendectomy complicated by intra-abdominal abscess requiring percutaneous drainage and antibiotics   Clinical Impression   Pt independent at baseline with functional mobility, reports daughter was assisting pt with shower transfers, daughter drives. Pt currently min guard A for ADLs, supervision for bed mobility, and min guard for transfers with RW. Pt with decreased cognition, able to follow 3 step trail making task, however disoriented to time and requires increased time for reverse sequencing task. Discussed safety with use of shower chair at home with pt, pt verbalized understanding. Pt presenting with impairments listed below, will follow acutely. Recommend HHOT at d/c.      Recommendations for follow up therapy are one component of a multi-disciplinary discharge planning process, led by the attending physician.  Recommendations may be updated based on patient status, additional functional criteria and insurance authorization.   Follow Up Recommendations  Home health OT    Assistance Recommended at Discharge Intermittent Supervision/Assistance  Patient can return home with the following A little help with walking and/or transfers;A little help with bathing/dressing/bathroom;Assistance with cooking/housework;Direct supervision/assist for medications management;Direct supervision/assist for financial  management;Assist for transportation;Help with stairs or ramp for entrance    Functional Status Assessment  Patient has had a recent decline in their functional status and demonstrates the ability to make significant improvements in function in a reasonable and predictable amount of time.  Equipment Recommendations  BSC/3in1    Recommendations for Other Services       Precautions / Restrictions Precautions Precautions: Fall Restrictions Weight Bearing Restrictions: No      Mobility Bed Mobility Overal bed mobility: Needs Assistance Bed Mobility: Supine to Sit, Sit to Supine     Supine to sit: Supervision, HOB elevated Sit to supine: Supervision, HOB elevated        Transfers Overall transfer level: Needs assistance Equipment used: Rolling walker (2 wheels) Transfers: Sit to/from Stand Sit to Stand: Min guard                  Balance Overall balance assessment: Mild deficits observed, not formally tested                                         ADL either performed or assessed with clinical judgement   ADL Overall ADL's : Needs assistance/impaired Eating/Feeding: Modified independent;Sitting   Grooming: Sitting;Min guard Grooming Details (indicate cue type and reason): washing hands at sink in standing Upper Body Bathing: Sitting;Min guard   Lower Body Bathing: Sit to/from stand;Sitting/lateral leans;Min guard   Upper Body Dressing : Min guard;Sitting   Lower Body Dressing: Min guard;Sitting/lateral leans;Sit to/from stand Lower Body Dressing Details (indicate cue type and reason): able to reach down and pull up socks Toilet Transfer: Min guard;Rolling walker (2 wheels);Ambulation;Regular Glass blower/designer Details (indicate cue type and reason): simulated in room Toileting- Clothing Manipulation and Hygiene: Supervision/safety  Functional mobility during ADLs: Min guard;Rolling walker (2 wheels)       Vision    Additional Comments: will further assess, pt with difficutly reading label on bottle to find soap     Perception     Praxis      Pertinent Vitals/Pain Pain Assessment Pain Assessment: No/denies pain Pain Score: 0-No pain Pain Intervention(s): Monitored during session     Hand Dominance Right   Extremity/Trunk Assessment Upper Extremity Assessment Upper Extremity Assessment: Overall WFL for tasks assessed   Lower Extremity Assessment Lower Extremity Assessment: Defer to PT evaluation   Cervical / Trunk Assessment Cervical / Trunk Assessment: Normal   Communication Communication Communication: No difficulties   Cognition Arousal/Alertness: Lethargic Behavior During Therapy: Flat affect Overall Cognitive Status: Impaired/Different from baseline Area of Impairment: Orientation, Safety/judgement                 Orientation Level: Disoriented to, Time, Situation       Safety/Judgement: Decreased awareness of deficits, Decreased awareness of safety     General Comments: pt stating it is may 2023, able to report that her balance is worse than normal; able to follow 3 step trailmaking task, can count backwards from 20 with increased time, only able to state 3 months of year in reverse order     General Comments  VSS on RA;    Exercises     Shoulder Instructions      Home Living Family/patient expects to be discharged to:: Private residence Living Arrangements: Children (daughter) Available Help at Discharge: Family;Available PRN/intermittently Type of Home: House Home Access: Stairs to enter [employer] of Steps: 3 Entrance Stairs-Rails: None Home Layout: One level     Bathroom Shower/Tub: Occupational psychologist: Handicapped height     Home Equipment: Grab bars - tub/shower;Grab bars - toilet          Prior Functioning/Environment Prior Level of Function : Independent/Modified Independent             Mobility  Comments: Pt reports no use of AD. ADLs Comments: daughter was assisting pt with shower transfers, daughter drives        OT Problem List: Decreased strength;Decreased range of motion;Decreased activity tolerance;Impaired balance (sitting and/or standing);Decreased cognition;Decreased safety awareness      OT Treatment/Interventions: Therapeutic exercise;Self-care/ADL training;Therapeutic activities;Patient/family education;Balance training;Cognitive remediation/compensation;Visual/perceptual remediation/compensation;Energy conservation;DME and/or AE instruction    OT Goals(Current goals can be found in the care plan section) Acute Rehab OT Goals Patient Stated Goal: none stated OT Goal Formulation: With patient Time For Goal Achievement: 08/08/21 Potential to Achieve Goals: Good  OT Frequency: Min 2X/week    Co-evaluation              AM-PAC OT "6 Clicks" Daily Activity     Outcome Measure Help from another person eating meals?: None Help from another person taking care of personal grooming?: A Little Help from another person toileting, which includes using toliet, bedpan, or urinal?: A Little Help from another person bathing (including washing, rinsing, drying)?: A Little Help from another person to put on and taking off regular upper body clothing?: A Little Help from another person to put on and taking off regular lower body clothing?: A Little 6 Click Score: 19   End of Session Equipment Utilized During Treatment: Rolling walker (2 wheels);Gait belt Nurse Communication: Mobility status  Activity Tolerance: Patient tolerated treatment well Patient left: in bed;with call bell/phone within reach;with bed alarm set (lab in room  for blood draw)  OT Visit Diagnosis: Unsteadiness on feet (R26.81);Other abnormalities of gait and mobility (R26.89);Muscle weakness (generalized) (M62.81)                Time: [id number] OT Time Calculation (min): 17 min Charges:  OT General  Charges $OT Visit: 1 Visit OT Evaluation $OT Eval Moderate Complexity: 1 [address] OTD, OTR/L Acute Rehab [phone]) [phone] [place] 07/25/2021, 4:41 PM

## 2021-07-25 NOTE — Progress Notes (Addendum)
Progress Note  Patient Name: Ashley Vaughn Date of Encounter: 07/25/2021  [facility] Cardiologist: None   Subjective   Denies any chest pain or SOB.  TEE this admit showed possible small PFO with right to left shunting by agitated contrast saline.    Inpatient Medications    Scheduled Meds:  aspirin EC  81 mg Oral Daily   carvedilol  6.25 mg Oral BID WC   ciprofloxacin  750 mg Oral Daily   clopidogrel  75 mg Oral Daily   DULoxetine  60 mg Oral Daily   heparin  5,000 Units Subcutaneous Q8H   insulin aspart  0-15 Units Subcutaneous TID WC   insulin aspart  0-5 Units Subcutaneous QHS   insulin glargine-yfgn  20 Units Subcutaneous Daily   metroNIDAZOLE  500 mg Oral Q12H   rosuvastatin  20 mg Oral Daily   sodium chloride flush  5 mL Intracatheter Q8H   Continuous Infusions:   PRN Meds: acetaminophen, LORazepam, polyethylene glycol   Vital Signs    Vitals:   07/25/21 0000 07/25/21 0430 07/25/21 0804 07/25/21 1144  BP: 121/72 114/73 117/72 106/69  Pulse: 100 94 96 81  Resp: 18  16 16   Temp: 98.8 F (37.1 C) 98.4 F (36.9 C) 98.2 F (36.8 C) 98.1 F (36.7 C)  TempSrc: Oral Oral Oral Oral  SpO2: 99% 100% 99% 100%  Weight:      Height:        Intake/Output Summary (Last 24 hours) at 07/25/2021 1232 Last data filed at 07/24/2021 1909 Gross per 24 hour  Intake 245 ml  Output --  Net 245 ml       07/19/2021    4:17 PM 07/18/2021    8:30 AM 05/09/2021    2:47 PM  Last 3 Weights  Weight (lbs) 140 lb 141 lb 143 lb 9.6 oz  Weight (kg) 63.504 kg 63.957 kg 65.137 kg      Telemetry    NSR - Personally Reviewed  ECG    No new EKG to review- Personally Reviewed  Physical Exam  GEN: Well nourished, well developed in no acute distress HEENT: Normal NECK: No JVD; No carotid bruits LYMPHATICS: No lymphadenopathy CARDIAC:RRR, no murmurs, rubs, gallops RESPIRATORY:  Clear to auscultation without rales, wheezing or rhonchi  ABDOMEN: Soft, non-tender,  non-distended MUSCULOSKELETAL:  No edema; No deformity  SKIN: Warm and dry NEUROLOGIC:  Alert and oriented x 3 PSYCHIATRIC:  Normal affect   Labs    High Sensitivity Troponin:   Recent Labs  Lab 07/18/21 0010 07/18/21 0317  TROPONINIHS 10 16      Chemistry Recent Labs  Lab 07/18/21 1831 07/19/21 0359 07/20/21 0347 07/23/21 [id number] 07/24/21 0323 07/24/21 0624 07/24/21 1202 07/24/21 1918 07/25/21 0419  NA 140 140   < > 138 135  --   --   --  136  K 3.2* 2.9*   < > 5.0 6.7*   < > 5.5* 5.2* 4.9  CL 108 107   < > 108 112*  --   --   --  107  CO2 22 26   < > 20* 19*  --   --   --  19*  GLUCOSE 324* 257*   < > 82 143*  --   --   --  129*  BUN 9 6   < > 9 17  --   --   --  16  CREATININE 0.89 0.81   < > 1.43* 2.75*  --   --   --  3.41*  CALCIUM 8.7* 8.6*   < > 9.0 8.8*  --   --   --  9.3  MG 1.9 1.9  --   --   --   --   --   --   --   PROT  --  6.5   < > 6.9 6.9  --   --   --  6.8  ALBUMIN  --  2.3*   < > 2.6* 2.7*  --   --   --  2.7*  AST  --  17   < > 20 20  --   --   --  22  ALT  --  16   < > 22 19  --   --   --  19  ALKPHOS  --  116   < > 123 132*  --   --   --  112  BILITOT  --  0.2*   < > 0.5 0.2*  --   --   --  0.4  GFRNONAA >60 >60   < > 43* 20*  --   --   --  15*  ANIONGAP 10 7   < > 10 4*  --   --   --  10   < > = values in this interval not displayed.     Lipids  Recent Labs  Lab 07/19/21 0359  CHOL 148  TRIG 118  HDL 34*  LDLCALC 90  CHOLHDL 4.4     Hematology Recent Labs  Lab 07/23/21 0331 07/24/21 0323 07/25/21 0419  WBC 5.6 6.6 5.8  RBC 4.24 4.23 4.12  HGB 9.4* 9.2* 9.0*  HCT 30.2* 30.4* 29.3*  MCV 71.2* 71.9* 71.1*  MCH 22.2* 21.7* 21.8*  MCHC 31.1 30.3 30.7  RDW 17.8* 17.9* 18.2*  PLT 472* 469* 464*    Thyroid No results for input(s): TSH, FREET4 in the last 168 hours.  BNPNo results for input(s): BNP, PROBNP in the last 168 hours.  DDimer No results for input(s): DDIMER in the last 168 hours.   Radiology    US RENAL  Result  Date: 07/25/2021 CLINICAL DATA:  Acute kidney injury EXAM: RENAL / URINARY TRACT ULTRASOUND COMPLETE COMPARISON:  CT 07/18/2021 FINDINGS: Right Kidney: Renal measurements: 11.1 x 4.4 x 5.4 cm = volume: 138 mL. Echogenicity within normal limits. No mass, shadowing stone, or hydronephrosis visualized. Left Kidney: Renal measurements: 10.3 x 4.8 x 3.9 cm = volume: 101 mL. Echogenicity within normal limits. 8 mm lower pole left renal cyst. No follow-up required. No solid mass, shadowing stone, or hydronephrosis visualized. Bladder: Appears normal for degree of bladder distention. Other: None. IMPRESSION: Negative for obstructive uropathy. Electronically Signed   By: Davina Poke D.O.   On: 07/25/2021 11:12   ECHO TEE  Result Date: 07/23/2021    TRANSESOPHOGEAL ECHO REPORT   Patient Name:   Ashley Vaughn Date of Exam: 07/23/2021 Medical Rec #:  [id number]   Height:       62.0 in Accession #:    [id number]  Weight:       140.0 lb Date of Birth:  Sep 30, 1965   BSA:          1.643 m Patient Age:    56 years    BP:           101/68 mmHg Patient Gender: F           HR:  69 bpm. Exam Location:  Inpatient Procedure: Transesophageal Echo, Color Doppler and Saline Contrast Bubble Study Indications:     Stroke  History:         Patient has prior history of Echocardiogram examinations, most                  recent Jul 25, 2021. Cardiomyopathy; Risk Factors:Hypertension.                  CVA, HIV.  Sonographer:     Clayton Lefort RDCS (AE) Referring Phys:  [id number] Erma Heritage Diagnosing Phys: Buford Dresser MD PROCEDURE: After discussion of the risks and benefits of a TEE, an informed consent was obtained from the patient. The transesophogeal probe was passed without difficulty through the esophogus of the patient. Sedation performed by different physician. The patient was monitored while under deep sedation. Anesthestetic sedation was provided intravenously by Anesthesiology: 158.43mg  of Propofol. Image quality was  good. The patient developed no complications during the procedure. IMPRESSIONS  1. Left ventricular ejection fraction, by estimation, is 30 to 35%. The left ventricle has moderately decreased function. The left ventricle demonstrates global hypokinesis.  2. Right ventricular systolic function is normal. The right ventricular size is normal.  3. No left atrial/left atrial appendage thrombus was detected.  4. The mitral valve is normal in structure. Trivial mitral valve regurgitation. No evidence of mitral stenosis.  5. The aortic valve is tricuspid. Aortic valve regurgitation is not visualized. No aortic stenosis is present.  6. There is mild (Grade II) plaque involving the descending aorta.  7. Cannot exclude a small PFO. trivial right to left shunt. Conclusion(s)/Recommendation(s): No evidence of PFO by color doppler. Agitated saline injection shows trivial bubble within 3 beats, consistent with possible small right to left intracardiac shunt. FINDINGS  Left Ventricle: Left ventricular ejection fraction, by estimation, is 30 to 35%. The left ventricle has moderately decreased function. The left ventricle demonstrates global hypokinesis. The left ventricular internal cavity size was normal in size. Right Ventricle: The right ventricular size is normal. No increase in right ventricular wall thickness. Right ventricular systolic function is normal. Left Atrium: Left atrial size was normal in size. No left atrial/left atrial appendage thrombus was detected. Right Atrium: Right atrial size was normal in size. Prominent Eustachian valve. Pericardium: Trivial pericardial effusion is present. Mitral Valve: The mitral valve is normal in structure. Trivial mitral valve regurgitation. No evidence of mitral valve stenosis. There is no evidence of mitral valve vegetation. Tricuspid Valve: The tricuspid valve is normal in structure. Tricuspid valve regurgitation is trivial. No evidence of tricuspid stenosis. There is no evidence  of tricuspid valve vegetation. Aortic Valve: The aortic valve is tricuspid. Aortic valve regurgitation is not visualized. No aortic stenosis is present. There is no evidence of aortic valve vegetation. Pulmonic Valve: The pulmonic valve was grossly normal. Pulmonic valve regurgitation is not visualized. No evidence of pulmonic stenosis. There is no evidence of pulmonic valve vegetation. Aorta: The aortic root and ascending aorta are structurally normal, with no evidence of dilitation. There is mild (Grade II) plaque involving the descending aorta. IAS/Shunts: Cannot exclude a small PFO. Agitated saline contrast was given intravenously to evaluate for intracardiac shunting. Trivial right to left shunt. Buford Dresser MD Electronically signed by Buford Dresser MD Signature Date/Time: 07/23/2021/2:44:52 PM    Final     Cardiac Studies   ECHOCARDIOGRAM LIMITED 25-Jul-2021 1. No evidence of left ventricular thrombus with Definity contrast. Left ventricular ejection fraction, by estimation, is  30 to 35%. The left ventricle has moderately decreased function. The left ventricle demonstrates global hypokinesis. Conclusion(s)/Recommendation(s): Limited study for LV thrombus.  Patient Profile     56 y.o. female with history of poorly controlled diabetes mellitus and hypertension, chronic HIV infection on antiretroviral therapies, anemia, intra-abdominal abscess following recent appendectomy, requiring percutaneous drain, admitted with seizure and found to have evidence of acute ischemic infarct scattered in the subcortical right cerebral hemisphere, suspicious for embolic event.  Noted to have severely depressed left ventricular systolic function on echocardiography, but without clinically evident heart failure.  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  Assessment & Plan    Cardiomyopathy/systolic HF:   -Appears well compensated hemodynamically/euvolemic.  No symptoms of left heart failure.   -Etiology uncertain, but suspect insufficiently treated hypertension may have a lot to do with it.   -Has multiple risk factors for CAD (including uncontrolled diabetes mellitus), but does not have angina or ECG changes suggestive of ischemia.  -Unfortunately serum creatinine jumped to 2.75 yesterday and now bumped to 3.41 -Losartan and Jardiance have been stopped -stop BB due to soft BP -Coronary CTA canceled due to bump in serum creatinine -not candidate for GDMT for CHF at this time due to AKI and soft BP -Ischemic work-up can preformed as an outpatient once her other noncardiac issues have improved  Embolic stroke:  -TEE this admit showed possible small PFO with right to left shunting  HIV:  -On antiretroviral therapy.   -Undetectable viral load as of March 2023.  Normal CD4 count 1447.  Anemia:  -Microcytic, but with normal iron stores by labs.   -Microcytosis is a chronic finding dating back more than 10 years, consistent with thalassemia trait, but baseline hemoglobin seems to be around 12.  DM:  -poorly controlled -treatment per TR[facility]  Appendicitis  -complicated by retrocecal abscess with indwelling drain  7.  Hyperkalemia -Potassium 7.5 yetserday in the setting of AKI>>resolved -ARB stopped  8. AKI -Serum creatinine has bumped from 1.22>>2.75 >>3.41today>> question of related to mild hypotension during TEE sedation yesterday -stopped diuretics, ARB and Jardiance yesterday but renal function continues to worsen -recommend Renal consult    Will follow at a distance for now until SCr improves and can restart HF meds  For questions or updates, please contact [place] Please consult [email or web address] for contact info under        Signed, Fransico Him, MD  07/25/2021, 12:32 PM

## 2021-07-26 LAB — GLUCOSE, CAPILLARY
Glucose-Capillary: 106 mg/dL — ABNORMAL HIGH (ref 70–99)
Glucose-Capillary: 255 mg/dL — ABNORMAL HIGH (ref 70–99)
Glucose-Capillary: 115 mg/dL — ABNORMAL HIGH (ref 70–99)
Glucose-Capillary: 133 mg/dL — ABNORMAL HIGH (ref 70–99)

## 2021-07-26 LAB — CBC
HCT: 27.9 % — ABNORMAL LOW (ref 36.0–46.0)
Hemoglobin: 8.5 g/dL — ABNORMAL LOW (ref 12.0–15.0)
MCH: 21.9 pg — ABNORMAL LOW (ref 26.0–34.0)
MCHC: 30.5 g/dL (ref 30.0–36.0)
MCV: 71.7 fL — ABNORMAL LOW (ref 80.0–100.0)
Platelets: 448 10*3/uL — ABNORMAL HIGH (ref 150–400)
RBC: 3.89 MIL/uL (ref 3.87–5.11)
RDW: 18.1 % — ABNORMAL HIGH (ref 11.5–15.5)
WBC: 6 10*3/uL (ref 4.0–10.5)
nRBC: 0 % (ref 0.0–0.2)

## 2021-07-26 LAB — URINALYSIS, COMPLETE (UACMP) WITH MICROSCOPIC
Bilirubin Urine: NEGATIVE
Glucose, UA: 150 mg/dL — AB
Hgb urine dipstick: NEGATIVE
Ketones, ur: NEGATIVE mg/dL
Leukocytes,Ua: NEGATIVE
Nitrite: NEGATIVE
Protein, ur: 30 mg/dL — AB
Specific Gravity, Urine: 1.011 (ref 1.005–1.030)
pH: 6 (ref 5.0–8.0)

## 2021-07-26 LAB — BASIC METABOLIC PANEL
Anion gap: 8 (ref 5–15)
BUN: 19 mg/dL (ref 6–20)
CO2: 21 mmol/L — ABNORMAL LOW (ref 22–32)
Calcium: 8.9 mg/dL (ref 8.9–10.3)
Chloride: 107 mmol/L (ref 98–111)
Creatinine, Ser: 3.39 mg/dL — ABNORMAL HIGH (ref 0.44–1.00)
GFR, Estimated: 15 mL/min — ABNORMAL LOW (ref >60)
Glucose, Bld: 139 mg/dL — ABNORMAL HIGH (ref 70–99)
Potassium: 4.3 mmol/L (ref 3.5–5.1)
Sodium: 136 mmol/L (ref 135–145)

## 2021-07-26 LAB — CREATININE, URINE, RANDOM: Creatinine, Urine: 66.22 mg/dL

## 2021-07-26 LAB — SODIUM, URINE, RANDOM: Sodium, Ur: 68 mmol/L

## 2021-07-26 MED ORDER — ROSUVASTATIN CALCIUM 5 MG PO TABS
10.0000 mg | ORAL_TABLET | Freq: Every day | ORAL | Status: DC
  Administered 2021-07-27: 10 mg via ORAL
  Filled 2021-07-26: qty 2

## 2021-07-26 MED ORDER — ENSURE ENLIVE PO LIQD
237.0000 mL | Freq: Two times a day (BID) | ORAL | Status: DC
  Administered 2021-07-26 – 2021-07-27 (×3): 237 mL via ORAL

## 2021-07-26 MED ORDER — LACTATED RINGERS IV SOLN: INTRAVENOUS | Status: AC

## 2021-07-26 NOTE — Hospital Course (Signed)
Embolic stroke Seizure-like activity Patient presented with seizure-like activity and a syncopal episode. MRI showed right occipital, right MCA/ACA infarcts. CTA head and neck showed 60% right ICA origin stenosis and moderate stenosis of the left ICA. She was initially started on Keppra which was later discontinued when EEG showed no seizure. Further cardiac work-up showed newly diagnosed heart failure with reduced EF of 30-35%. TEE showed no evidence of thrombus however agitated saline showed possible small PFO with right to left intracardiac shunt. It was suspected that her stroke was thromboembolic in etiology with her right ICA origin stenosis and her stroke was right-sided. She was started on aspirin and Plavix for DAPT as well as Crestor. She remained neurologically intact with no focal weakness or deficit. No recurrent episodes of seizure-like activity. She was followed closely by the neurology team during this admission.   Acute kidney injury Patient had sudden increase in creatinine, from 1.43 to 2.75. Baseline appears to be around 0.8. Etiology of AKI was unclear however initially thought to be related to hypotension in the setting of sedation for her TEE. Kidney function continued to decline despite gentle hydration. More aggressive hydration was limited by her newly diagnosed HFrEF. Patient also had poor PO intake during this admission, possibly contributing to her AKI due to dehydration. Renal US showed no obstruction. Further urine studies showed FeNa of 2.6% indicating intrinsic pattern of kidney injury. Patient was also on Keppra for her seizure-like activity which may have worsened her AKI. At time of discharge, Cr was _____.   Hyperkalemia Potassium was elevated up to 7.5 during this admission. She had been receiving potassium supplement due to hypokalemia earlier in this admission. She was also on spironolactone, losartan, and Jardiance with her newly diagnosed HFrEF.EKG showed wide T  waves and prolonged PR interval. Patient remained asymptomatic with no chest pain or palpitations. She was immediately started on IV calcium gluconate, 5 units of Novolog with dextrose. She was also receiving her insulin regimen of Semglee and SSI for her diabetes. Potassium improved to 5.5 after one time albuterol nebulizer. Also received Lokelma with potassium down to __. Hyperkalemia may have been exacerbated by her AKI in addition to her potassium supplement and potassium-sparing medications. Her Jardiance, losartan, and spironolactone were held for the remainder of this admission***  HFrEF (30-35%) Newly diagnosed HFrEF (30-35%) noted on echocardiogram. Unclear etiology of her heart failure but possibly due to hypertension. Patient was asymptomatic and euvolemic. She was scheduled for ischemic evaluation with CTA however was canceled due to her AKI. At time of discharge _____.   Post-appendectomy abdominal abscess Patient had recent appendectomy with post-procedure retrocecal abscess. She is s/p drain placement with drain exchange on 06/01. She was continued on antibiotics (ciprofloxacin and metronidazole x2 weeks per ID recommendation) during this admission. No fever or leukocytosis. She will require close follow-up with IR after discharge for repeat imaging and possible drain injection.   HIV Patient was continued on her Odefsy during this admission however was discontinued in the setting of her AKI with GFR less than 20. Will need to be restarted once her kidney function improves. Last viral load was undetectable in March 2023.

## 2021-07-26 NOTE — Progress Notes (Signed)
Physical Therapy Treatment Patient Details Name: Ashley Vaughn MRN: [id number] DOB: March 30, 1965 Today's Date: 07/26/2021   History of Present Illness Pt is a 56 y.o. female who presented 07/18/21 due to seizure-like activity. Of note, pt seen in ED 07/17/21 for syncope episode preceded by nausea, vomiting, and shaking, and workup was unrevealing and pt discharged home. MRI shows scattered acute ischemic infarcts involving the subcortical right cerebral hemisphere. EEG-independent bilateral temporal slowing, no seizure. S/p RLQ drain exchange/upsize 6/1, TEE 6/5. PMH: HTN, DM2, HIV, HSV, recent appendectomy complicated by intra-abdominal abscess requiring percutaneous drainage and antibiotics    PT Comments    Pt making good progress but not quite back to baseline. Recommend HHPT and rolling walker for home.    Recommendations for follow up therapy are one component of a multi-disciplinary discharge planning process, led by the attending physician.  Recommendations may be updated based on patient status, additional functional criteria and insurance authorization.  Follow Up Recommendations  Home health PT     Assistance Recommended at Discharge Intermittent Supervision/Assistance  Patient can return home with the following A little help with walking and/or transfers;Assistance with cooking/housework;Assist for transportation;Help with stairs or ramp for entrance   Equipment Recommendations  Rolling walker (2 wheels)    Recommendations for Other Services       Precautions / Restrictions Precautions Precautions: Fall Restrictions Weight Bearing Restrictions: No     Mobility  Bed Mobility Overal bed mobility: Modified Independent Bed Mobility: Supine to Sit, Sit to Supine     Supine to sit: HOB elevated, Modified independent (Device/Increase time) Sit to supine: HOB elevated, Modified independent (Device/Increase time)        Transfers Overall transfer level: Modified  independent Equipment used: Rolling walker (2 wheels) Transfers: Sit to/from Stand Sit to Stand: Modified independent (Device/Increase time)                Ambulation/Gait Ambulation/Gait assistance: Supervision Gait Distance (Feet): 250 Feet Assistive device: Rolling walker (2 wheels) Gait Pattern/deviations: Step-through pattern, Decreased stride length, Narrow base of support Gait velocity: decr Gait velocity interpretation: 1.31 - 2.62 ft/sec, indicative of limited community ambulator   General Gait Details: Verbal cues to stay closer t walker on turns   Stairs Stairs: Yes Stairs assistance: Supervision Stair Management: Forwards, Two rails, Alternating pattern Number of Stairs: 6 General stair comments: Assist due to safety   Wheelchair Mobility    Modified Rankin (Stroke Patients Only) Modified Rankin (Stroke Patients Only) Pre-Morbid Rankin Score: No symptoms Modified Rankin: Moderately severe disability     Balance Overall balance assessment: Mild deficits observed, not formally tested                                          Cognition Arousal/Alertness: Awake/alert Behavior During Therapy: Flat affect Overall Cognitive Status: Impaired/Different from baseline Area of Impairment: Safety/judgement                         Safety/Judgement: Decreased awareness of deficits, Decreased awareness of safety     General Comments: Pt with very flat affect. Unsure of baseline        Exercises      General Comments        Pertinent Vitals/Pain Pain Assessment Pain Assessment: No/denies pain    Home Living  Prior Function            PT Goals (current goals can now be found in the care plan section) Acute Rehab PT Goals Patient Stated Goal: to go home Progress towards PT goals: Progressing toward goals    Frequency    Min 4X/week      PT Plan Current plan remains  appropriate    Co-evaluation              AM-PAC PT "6 Clicks" Mobility   Outcome Measure  Help needed turning from your back to your side while in a flat bed without using bedrails?: None Help needed moving from lying on your back to sitting on the side of a flat bed without using bedrails?: None Help needed moving to and from a bed to a chair (including a wheelchair)?: A Little Help needed standing up from a chair using your arms (e.g., wheelchair or bedside chair)?: A Little Help needed to walk in hospital room?: A Little Help needed climbing 3-5 steps with a railing? : A Little 6 Click Score: 20    End of Session   Activity Tolerance: Patient tolerated treatment well Patient left: in bed;with call bell/phone within reach;with bed alarm set   PT Visit Diagnosis: Unsteadiness on feet (R26.81);Other abnormalities of gait and mobility (R26.89);Difficulty in walking, not elsewhere classified (R26.2);Other symptoms and signs involving the nervous system (R29.898)     Time: 0940-7680 PT Time Calculation (min) (ACUTE ONLY): 13 min  Charges:  $Gait Training: 8-22 mins                     [facility] PT Acute Rehabilitation Services Office [phone]    Angelina Ok [facility] 07/26/2021, 4:18 PM

## 2021-07-26 NOTE — Progress Notes (Addendum)
HD#8 SUBJECTIVE:  Patient Summary: Ashley Vaughn is a  56 y.o. 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  Overnight Events: None  Interim History: Patient feels overall well this morning but has had recurrent incidents of feeling like her medicine is getting stuck in her throat. She understands that we need to increase her fluid intake to help her kidney function and is agreeable to increasing PO intake.  OBJECTIVE:  Vital Signs: Vitals:   07/25/21 1959 07/25/21 2340 07/26/21 0301 07/26/21 0800  BP: 111/64 99/64 120/75   Pulse: 91 89 (!) 103   Resp:  18 18 16   Temp: 98.2 F (36.8 C) 97.8 F (36.6 C) 97.9 F (36.6 C)   TempSrc: Oral     SpO2: 100% 100% 98% 98%  Weight:      Height:       Supplemental O2: Room Air SpO2: 98 % O2 Flow Rate (L/min): 3 L/min  Filed Weights   07/19/21 1617  Weight: 63.5 kg    No intake or output data in the 24 hours ending 07/26/21 0819 Net IO Since Admission: 3,662.37 mL [07/26/21 0819]  Physical Exam: Constitutional:Resting comfortably in bed, no acute distress. Interacting happily with family who is present in the room. Cardio:Regular rate and rhythm. No murmurs, rubs, gallops. Pulm:Normal work of breathing on room air. Abdomen:Soft, nontender, nondistended. 09/25/21 for extremity edema. Moves all extremities spontaneously. Skin:Warm and dry. Neuro:Alert and oriented x3. No focal deficit noted. Psych:Pleasant mood and affect.  Patient Lines/Drains/Airways Status     Active Line/Drains/Airways     Name Placement date Placement time Site Days   Peripheral IV 07/19/21 20 G 2.5" Anterior;Proximal;Right Forearm 07/19/21  0557  Forearm  7   Closed System Drain Right RLQ 12 Fr. 07/19/21  1544  RLQ  7             ASSESSMENT/PLAN:   Assessment: Principal Problem:   Seizure (HCC) Active Problems:   Human immunodeficiency virus (HIV) disease (HCC)   Postprocedural intraabdominal abscess   CVA (cerebral vascular accident) (HCC)   Cardiomyopathy (HCC)   Plan: Acute kidney injury Patient has had minimal improvement of AKI with morning creatinine 3.39. She received gentle hydration 06/07 and has been encouraged to increase PO intake, especially fluids. At this time it is felt that her AKI is multifactorial, including Keppra therapy, episode of hypotension during procedure earlier this week, and overall poor PO intake. Urine studies thus far reveal an intrinsic pattern of kidney injury with FeNa 2.6%; UA is pending at this time. She is euvolemic on exam. -F/u UA -LR 75 cc/h x6h -Ensure supplements ordered to increase PO intake -Continue to hold Jardiance, spironolactone, and losartan   Embolic CVA Patient is not had any changes in neurological status since admission. Source thought to be from 60% right ICA origin stenosis, moderate on the left. Currently on aspirin 81 mg daily and Plavix 75 mg daily for DAPT.  -Continue DAPT.  -Adjust Crestor dose to 10 mg daily due to decreased CrCl with AKI   HFrEF (30-35%) Patient is clinically euvolemic without dyspnea, orthopnea, edema.  -Outpatient ischemic work-up planned after discharge -Continue to hold Jardiance, spironolactone, and losartan with AKI -Continue carvedilol 6.25 mg BID -Strict I/O and daily weights   HTN BP stable. -Holding losartan, Jardiance, spironolactone 2/2 AKI -Continue  carvedilol   T2DM CBG range 106-180 over last 24h. -Continue Semglee and SSI   Post-appendectomy abdominal abscess Stable without fever or leukocytosis. She denies abdominal pain at this time. -ID following, appreciate their assistance -Continue metronidazole and adjusted dose of ciprofloxacin   HIV Most recent viral load in March 2023 was undetectable.  -Currently holding  ART due to AKI  Best Practice: Diet: Renal diet IVF: None VTE: heparin injection 5,000 Units Start: 07/19/21 1400 Code: Full AB: Metronidazole, ciprofloxacin Therapy Recs: Home Health, DME: bedside commode, walker, and other 3-in-1 DISPO: Anticipated discharge to Home pending Medical stability.  Signature: Ashley Vaughn, D.O.  Internal Medicine Resident, PGY-1 [facility]  Pager: [phone] 8:19 AM, 07/26/2021   Please contact the on call pager after 5 pm and on weekends at [phone].

## 2021-07-26 NOTE — Progress Notes (Signed)
Supervising Physician: Arne Cleveland  Patient Status:  [facility] - In-pt  Chief Complaint: Laparoscopic appendectomy with post-procedure retrocecal abscess s/p drain placement in IR (At [facility]) followed by drain exchange/upsize 07/19/21 by Dr. Vernard Gambles  Subjective: Patient in bed, RN is also in the room. Patient is hoping for discharge home tomorrow. Denies pain/discomfort.   Allergies: Fluorescein  Medications: Prior to Admission medications   Medication Sig Start Date End Date Taking? Authorizing Provider  amoxicillin-clavulanate (AUGMENTIN) 875-125 MG tablet Take 1 tablet by mouth See admin instructions. Bid x 14 days 07/12/21  Yes [provider]  Cholecalciferol (VITAMIN D3) 5000 units TABS Take 5,000 Units by mouth daily.   Yes [provider]  ciprofloxacin (CIPRO) 750 MG tablet Take 750 mg by mouth See admin instructions. Bid x 14 days 07/12/21  Yes [provider]  DULoxetine (CYMBALTA) 60 MG capsule Take 1 capsule (60 mg total) by mouth daily. 05/23/21 09/22/21 Yes Demaio, Alexa, MD  Empagliflozin-metFORMIN HCl (SYNJARDY) 5-500 MG TABS Take 1 tablet by mouth 2 (two) times daily. 07/18/21  Yes Demaio, Alexa, MD  emtricitabine-rilpivir-tenofovir AF (ODEFSEY) 200-25-25 MG TABS tablet Take 1 tablet by mouth daily with breakfast. 07/18/21  Yes Demaio, Alexa, MD  insulin glargine (LANTUS SOLOSTAR) 100 UNIT/ML Solostar Pen Inject 30 Units into the skin daily. 07/18/21 02/03/22 Yes Demaio, Alexa, MD  losartan-hydrochlorothiazide (HYZAAR) 50-12.5 MG tablet Take 1 tablet by mouth daily. 05/08/21  Yes Lajean Manes, MD  Accu-Chek Softclix Lancets lancets Check blood sugar 3 times per day 05/23/21   Scarlett Presto, MD  B-D UF III MINI PEN NEEDLES 31G X 5 MM MISC Use to inject insulin one time daily 07/18/21   Demaio, Alexa, MD  Blood Glucose Monitoring Suppl (ACCU-CHEK GUIDE ME) w/Device KIT 1 kit by Does not apply route daily. Patient not taking: Reported on 03/16/2021 09/30/19    Kuppelweiser, Cassie L, RPH-CPP  Blood Glucose Monitoring Suppl (ACCU-CHEK NANO SMARTVIEW) w/Device KIT 1 each by Does not apply route 2 (two) times daily. Check blood sugar 4x a day before meals and bedtime  2 days each week. Dx code- 250.00. 07/18/21   Demaio, Roxine Caddy, MD  glucose blood (ACCU-CHEK GUIDE) test strip Check blood sugar 3 times per day 07/18/21   Demaio, Alexa, MD  Lancets Misc. (ACCU-CHEK SOFTCLIX LANCET DEV) KIT Use to check blood sugar up to 3 times a day before meals. 07/18/21   Scarlett Presto, MD     Vital Signs: BP 122/74 (BP Location: Right Arm)   Pulse 96   Temp 97.9 F (36.6 C) (Oral)   Resp 17   Ht 5\' 2"  (1.575 m)   Wt 140 lb (63.5 kg)   SpO2 100%   BMI 25.61 kg/m   Physical Exam Constitutional:      General: She is not in acute distress.    Appearance: She is not ill-appearing.  Pulmonary:     Effort: Pulmonary effort is normal.  Abdominal:     Palpations: Abdomen is soft.     Tenderness: There is no abdominal tenderness.     Comments: RLQ drain to gravity. 5-10 ml of thin, tan fluid in gravity bag. Drain easily flushed with 10 ml NS. Small amount of tan drainage on gauze dressing.   Skin:    General: Skin is warm and dry.  Neurological:     Mental Status: She is alert and oriented to person, place, and time.     Imaging: US RENAL  Result Date: 07/25/2021 CLINICAL DATA:  Acute kidney injury EXAM: RENAL / URINARY TRACT ULTRASOUND COMPLETE COMPARISON:  CT 07/18/2021 FINDINGS: Right Kidney: Renal measurements: 11.1 x 4.4 x 5.4 cm = volume: 138 mL. Echogenicity within normal limits. No mass, shadowing stone, or hydronephrosis visualized. Left Kidney: Renal measurements: 10.3 x 4.8 x 3.9 cm = volume: 101 mL. Echogenicity within normal limits. 8 mm lower pole left renal cyst. No follow-up required. No solid mass, shadowing stone, or hydronephrosis visualized. Bladder: Appears normal for degree of bladder distention. Other: None. IMPRESSION: Negative for obstructive  uropathy. Electronically Signed   By: Davina Poke D.O.   On: 07/25/2021 11:12   ECHO TEE  Result Date: 07/23/2021    TRANSESOPHOGEAL ECHO REPORT   Patient Name:   ALIE MOUDY Deloney Date of Exam: 07/23/2021 Medical Rec #:  [id number]   Height:       62.0 in Accession #:    [id number]  Weight:       140.0 lb Date of Birth:  02/16/1966   BSA:          1.643 m Patient Age:    56 years    BP:           101/68 mmHg Patient Gender: F           HR:           69 bpm. Exam Location:  Inpatient Procedure: Transesophageal Echo, Color Doppler and Saline Contrast Bubble Study Indications:     Stroke  History:         Patient has prior history of Echocardiogram examinations, most                  recent 07/21/2021. Cardiomyopathy; Risk Factors:Hypertension.                  CVA, HIV.  Sonographer:     Clayton Lefort RDCS (AE) Referring Phys:  [id number] Erma Heritage Diagnosing Phys: Buford Dresser MD PROCEDURE: After discussion of the risks and benefits of a TEE, an informed consent was obtained from the patient. The transesophogeal probe was passed without difficulty through the esophogus of the patient. Sedation performed by different physician. The patient was monitored while under deep sedation. Anesthestetic sedation was provided intravenously by Anesthesiology: 158.43mg  of Propofol. Image quality was good. The patient developed no complications during the procedure. IMPRESSIONS  1. Left ventricular ejection fraction, by estimation, is 30 to 35%. The left ventricle has moderately decreased function. The left ventricle demonstrates global hypokinesis.  2. Right ventricular systolic function is normal. The right ventricular size is normal.  3. No left atrial/left atrial appendage thrombus was detected.  4. The mitral valve is normal in structure. Trivial mitral valve regurgitation. No evidence of mitral stenosis.  5. The aortic valve is tricuspid. Aortic valve regurgitation is not visualized. No aortic stenosis is present.   6. There is mild (Grade II) plaque involving the descending aorta.  7. Cannot exclude a small PFO. trivial right to left shunt. Conclusion(s)/Recommendation(s): No evidence of PFO by color doppler. Agitated saline injection shows trivial bubble within 3 beats, consistent with possible small right to left intracardiac shunt. FINDINGS  Left Ventricle: Left ventricular ejection fraction, by estimation, is 30 to 35%. The left ventricle has moderately decreased function. The left ventricle demonstrates global hypokinesis. The left ventricular internal cavity size was normal in size. Right Ventricle: The right ventricular size is normal. No increase in right ventricular wall thickness. Right ventricular systolic function is normal. Left Atrium: Left atrial  size was normal in size. No left atrial/left atrial appendage thrombus was detected. Right Atrium: Right atrial size was normal in size. Prominent Eustachian valve. Pericardium: Trivial pericardial effusion is present. Mitral Valve: The mitral valve is normal in structure. Trivial mitral valve regurgitation. No evidence of mitral valve stenosis. There is no evidence of mitral valve vegetation. Tricuspid Valve: The tricuspid valve is normal in structure. Tricuspid valve regurgitation is trivial. No evidence of tricuspid stenosis. There is no evidence of tricuspid valve vegetation. Aortic Valve: The aortic valve is tricuspid. Aortic valve regurgitation is not visualized. No aortic stenosis is present. There is no evidence of aortic valve vegetation. Pulmonic Valve: The pulmonic valve was grossly normal. Pulmonic valve regurgitation is not visualized. No evidence of pulmonic stenosis. There is no evidence of pulmonic valve vegetation. Aorta: The aortic root and ascending aorta are structurally normal, with no evidence of dilitation. There is mild (Grade II) plaque involving the descending aorta. IAS/Shunts: Cannot exclude a small PFO. Agitated saline contrast was given  intravenously to evaluate for intracardiac shunting. Trivial right to left shunt. Buford Dresser MD Electronically signed by Buford Dresser MD Signature Date/Time: 07/23/2021/2:44:52 PM    Final     Labs:  CBC: Recent Labs    07/23/21 0331 07/24/21 0323 07/25/21 0419 07/26/21 0116  WBC 5.6 6.6 5.8 6.0  HGB 9.4* 9.2* 9.0* 8.5*  HCT 30.2* 30.4* 29.3* 27.9*  PLT 472* 469* 464* 448*    COAGS: Recent Labs    07/20/21 0347  INR 1.1    BMP: Recent Labs    07/24/21 0323 07/24/21 0624 07/24/21 1918 07/25/21 0419 07/25/21 1552 07/26/21 0116  NA 135  --   --  136 137 136  K 6.7*   < > 5.2* 4.9 4.9 4.3  CL 112*  --   --  107 108 107  CO2 19*  --   --  19* 20* 21*  GLUCOSE 143*  --   --  129* 180* 139*  BUN 17  --   --  $R'16 18 19  'Rn$ CALCIUM 8.8*  --   --  9.3 9.2 8.9  CREATININE 2.75*  --   --  3.41* 3.50* 3.39*  GFRNONAA 20*  --   --  15* 15* 15*   < > = values in this interval not displayed.    LIVER FUNCTION TESTS: Recent Labs    07/22/21 0232 07/23/21 0643 07/24/21 0323 07/25/21 0419  BILITOT 0.3 0.5 0.2* 0.4  AST $Re'26 20 20 22  'fGM$ ALT $R'24 22 19 19  'ej$ ALKPHOS 142* 123 132* 112  PROT 6.5 6.9 6.9 6.8  ALBUMIN 2.3* 2.6* 2.7* 2.7*    Assessment and Plan:  Laparoscopic appendectomy with post-procedure retrocecal abscess s/p drain placement in IR (At [facility]) followed by drain exchange/upsize 07/19/21 by Dr. Vernard Gambles  Scant output for the past few days. Site with some mild leaking/drainage. No leaking observed during flushing. Site is non-tender. May consider re-imaging.   Drain Location: RLQ Size: Fr size: 12 Fr Date of placement: 07/19/21 Currently to: Drain collection device: gravity 24 hour output:  Output by Drain (mL) 07/24/21 0701 - 07/24/21 1900 07/24/21 1901 - 07/25/21 0700 07/25/21 0701 - 07/25/21 1900 07/25/21 1901 - 07/26/21 0700 07/26/21 0701 - 07/26/21 1440  Closed System Drain Right RLQ 12 Fr.         Interval imaging/drain manipulation:  None  Current examination: Flushes/aspirates easily.  Insertion site unremarkable Suture and stat lock in place. Dressed appropriately.  Plan: Continue TID flushes with 5 cc NS. Record output Q shift. Dressing changes QD or PRN if soiled.  May consider re-imaging due to low output.   Discharge planning: Please contact IR APP or on call IR MD prior to patient d/c to ensure appropriate follow up plans are in place. Typically patient will follow up with IR clinic 10-14 days post d/c for repeat imaging/possible drain injection. IR scheduler will contact patient with date/time of appointment. Patient will need to flush drain QD with 5 cc NS, record output QD, dressing changes every 2-3 days or earlier if soiled.   IR will continue to follow - please call with questions or concerns.   Electronically Signed: Soyla Dryer, AGACNP-BC [phone] 07/26/2021, 1:58 PM   I spent a total of 15 Minutes at the the patient's bedside AND on the patient's hospital floor or unit, greater than 50% of which was counseling/coordinating care for RLQ drain

## 2021-07-27 ENCOUNTER — Inpatient Hospital Stay: Admission: RE | Admit: 2021-07-27 | Payer: Medicaid Other | Source: Ambulatory Visit

## 2021-07-27 ENCOUNTER — Other Ambulatory Visit (HOSPITAL_COMMUNITY): Payer: Self-pay

## 2021-07-27 ENCOUNTER — Other Ambulatory Visit: Payer: Medicaid Other

## 2021-07-27 DIAGNOSIS — I5020 Unspecified systolic (congestive) heart failure: Secondary | ICD-10-CM | POA: Insufficient documentation

## 2021-07-27 LAB — BASIC METABOLIC PANEL
Anion gap: 5 (ref 5–15)
BUN: 22 mg/dL — ABNORMAL HIGH (ref 6–20)
CO2: 23 mmol/L (ref 22–32)
Calcium: 8.7 mg/dL — ABNORMAL LOW (ref 8.9–10.3)
Chloride: 109 mmol/L (ref 98–111)
Creatinine, Ser: 3.43 mg/dL — ABNORMAL HIGH (ref 0.44–1.00)
GFR, Estimated: 15 mL/min — ABNORMAL LOW (ref >60)
Glucose, Bld: 109 mg/dL — ABNORMAL HIGH (ref 70–99)
Potassium: 4.3 mmol/L (ref 3.5–5.1)
Sodium: 137 mmol/L (ref 135–145)

## 2021-07-27 LAB — CBC
HCT: 29.6 % — ABNORMAL LOW (ref 36.0–46.0)
Hemoglobin: 9 g/dL — ABNORMAL LOW (ref 12.0–15.0)
MCH: 21.7 pg — ABNORMAL LOW (ref 26.0–34.0)
MCHC: 30.4 g/dL (ref 30.0–36.0)
MCV: 71.3 fL — ABNORMAL LOW (ref 80.0–100.0)
Platelets: 488 10*3/uL — ABNORMAL HIGH (ref 150–400)
RBC: 4.15 MIL/uL (ref 3.87–5.11)
RDW: 17.9 % — ABNORMAL HIGH (ref 11.5–15.5)
WBC: 5.6 10*3/uL (ref 4.0–10.5)
nRBC: 0 % (ref 0.0–0.2)

## 2021-07-27 LAB — GLUCOSE, CAPILLARY
Glucose-Capillary: 133 mg/dL — ABNORMAL HIGH (ref 70–99)
Glucose-Capillary: 168 mg/dL — ABNORMAL HIGH (ref 70–99)
Glucose-Capillary: 212 mg/dL — ABNORMAL HIGH (ref 70–99)

## 2021-07-27 MED ORDER — ASPIRIN 81 MG PO TBEC
81.0000 mg | DELAYED_RELEASE_TABLET | Freq: Every day | ORAL | 12 refills | Status: DC
  Filled 2021-07-27: qty 30, 30d supply, fill #0

## 2021-07-27 MED ORDER — CIPROFLOXACIN HCL 500 MG PO TABS
500.0000 mg | ORAL_TABLET | Freq: Every day | ORAL | 0 refills | Status: AC
  Filled 2021-07-27: qty 6, 6d supply, fill #0

## 2021-07-27 MED ORDER — CLOPIDOGREL BISULFATE 75 MG PO TABS
75.0000 mg | ORAL_TABLET | Freq: Every day | ORAL | 0 refills | Status: DC
  Filled 2021-07-27: qty 21, 21d supply, fill #0

## 2021-07-27 MED ORDER — METRONIDAZOLE 500 MG PO TABS
500.0000 mg | ORAL_TABLET | Freq: Two times a day (BID) | ORAL | 0 refills | Status: AC
  Filled 2021-07-27: qty 13, 7d supply, fill #0

## 2021-07-27 MED ORDER — LANTUS SOLOSTAR 100 UNIT/ML ~~LOC~~ SOPN
30.0000 [IU] | PEN_INJECTOR | Freq: Every day | SUBCUTANEOUS | 3 refills | Status: DC
  Filled 2021-07-27: qty 9, 30d supply, fill #0

## 2021-07-27 MED ORDER — ROSUVASTATIN CALCIUM 10 MG PO TABS
10.0000 mg | ORAL_TABLET | Freq: Every day | ORAL | 2 refills | Status: DC
  Filled 2021-07-27: qty 30, 30d supply, fill #0

## 2021-07-27 MED ORDER — CIPROFLOXACIN HCL 500 MG PO TABS
500.0000 mg | ORAL_TABLET | Freq: Every day | ORAL | Status: DC
  Administered 2021-07-27: 500 mg via ORAL
  Filled 2021-07-27: qty 1

## 2021-07-27 MED ORDER — CARVEDILOL 6.25 MG PO TABS
6.2500 mg | ORAL_TABLET | Freq: Two times a day (BID) | ORAL | 11 refills | Status: DC
  Filled 2021-07-27: qty 60, 30d supply, fill #0

## 2021-07-27 NOTE — Discharge Summary (Addendum)
Name: Ashley Vaughn MRN: [id number] DOB: November 10, 1965 56 y.o. PCP: Ashley Brill, MD  Date of Admission: 07/18/2021  5:48 PM Date of Discharge: No discharge date for patient encounter. Attending Physician: Ashley Falcon, MD  Discharge Diagnosis: 1. Embolic CVA 2. HFrEF (EF 30-35%) 3. Acute kidney injuy 4. HTN 5. Type 2 diabetes mellitus 6. Abdominal access 2/2 appendectomy, present at admission 7. HIV    Discharge Medications: Allergies as of 07/27/2021       Reactions   Fluorescein Itching, Rash   INTRAVENOUSLY        Medication List     STOP taking these medications    amoxicillin-clavulanate 875-125 MG tablet Commonly known as: AUGMENTIN   losartan-hydrochlorothiazide 50-12.5 MG tablet Commonly known as: HYZAAR   Odefsey 200-25-25 MG Tabs tablet Generic drug: emtricitabine-rilpivir-tenofovir AF   ondansetron 4 MG disintegrating tablet Commonly known as: ZOFRAN-ODT   Synjardy 5-500 MG Tabs Generic drug: Empagliflozin-metFORMIN HCl       TAKE these medications    Accu-Chek Guide Me w/Device Kit 1 kit by Does not apply route daily.   Accu-Chek Nano SmartView w/Device Kit 1 each by Does not apply route 2 (two) times daily. Check blood sugar 4x a day before meals and bedtime  2 days each week. Dx code- 250.00.   Accu-Chek Guide test strip Generic drug: glucose blood Check blood sugar 3 times per day   Accu-Chek Softclix Lancet Dev Kit Use to check blood sugar up to 3 times a day before meals.   Accu-Chek Softclix Lancets lancets Check blood sugar 3 times per day   Aspirin Low Dose 81 MG tablet Generic drug: aspirin EC Take 1 tablet (81 mg total) by mouth daily. Swallow whole. Start taking on: July 28, 2021   B-D UF III MINI PEN NEEDLES 31G X 5 MM Misc Generic drug: Insulin Pen Needle Use to inject insulin one time daily   carvedilol 6.25 MG tablet Commonly known as: Coreg Take 1 tablet (6.25 mg total) by mouth 2 (two) times daily.    ciprofloxacin 500 MG tablet Commonly known as: CIPRO Take 1 tablet (500 mg total) by mouth daily for 6 days. Start taking on: July 28, 2021 What changed:  medication strength how much to take when to take this additional instructions   clopidogrel 75 MG tablet Commonly known as: PLAVIX Take 1 tablet (75 mg total) by mouth daily. Start taking on: July 28, 2021   DULoxetine 60 MG capsule Commonly known as: Cymbalta Take 1 capsule (60 mg total) by mouth daily.   Lantus SoloStar 100 UNIT/ML Solostar Pen Generic drug: insulin glargine Inject 30 Units into the skin daily.   metroNIDAZOLE 500 MG tablet Commonly known as: FLAGYL Take 1 tablet (500 mg total) by mouth every 12 (twelve) hours for 7 days.   rosuvastatin 10 MG tablet Commonly known as: CRESTOR Take 1 tablet (10 mg total) by mouth daily. Start taking on: July 28, 2021   Vitamin D3 125 MCG (5000 UT) Tabs Take 5,000 Units by mouth daily.               Durable Medical Equipment  (From admission, onward)           Start     Ordered   07/27/21 1331  For home use only DME Walker rolling  Once       Question Answer Comment  Walker: With 5 Inch Wheels   Patient needs a walker to treat with the following  condition Stroke [facility])      07/27/21 1330   07/27/21 1234  For home use only DME 3 n 1  Once        07/27/21 1234              Discharge Care Instructions  (From admission, onward)           Start     Ordered   07/27/21 0000  Discharge wound care:       Comments: Keep dressing clean and dry. Replace soiled dressing as needed.   07/27/21 1440            Disposition and follow-up:   [name] was discharged from [facility] in Stable condition.  At the hospital follow up visit please address:  Acute Kidney Injury: Cr was 3.43 at time of discharge and GFR reduced at 15. Will require BMP in 1 week during follow-up visit. Thought to be drug-induced vs. Cardiorenal  syndrome.   HFrEF (30-35%): Patient requires ischemic work-up with CTA for newly diagnosed HFrEF and close regular follow-up with cardiology to start GDMT. At time of discharge, she is not on Jardiance, spirinolactone, or losartan due to AKI. Will need to be started on GDMT once AKI resolves.  Acute embolic stroke: On aspirin and Plavix for DAPT. Crestor dose was decreased from 20 mg daily to 10 mg daily in light of AKI and will need to be adjusted once AKI resolves. Follow-up with neurology outpatient.   Type 2 diabetes mellitus: A1c was 14.2 during this admission. Jardiance not restarted due to AKI. Will need close follow-up and management.   Hypertension: Newly diagnosed HFrEF may be related to uncontrolled hypertension. Will require close follow-up and aggressive control.   Retrocecal abscess s/p drain placement: Close follow-up with IR outpatient for possible repeat imaging. Will have 7 additional days of ciprofloxacin and metronidazole at discharge.  HIV: Odefy was held during this admission due to AKI. Will need to be restarted once AKI resolves. Close follow-up with ID physician.   2.  Labs / imaging needed at time of follow-up: CBC, CMP, coronary CTA  3.  Pending labs/ test needing follow-up: N/A  Follow-up Appointments:  Follow-up [place]. Follow up.   Why: The home health agency will contact you for the first home visit. Contact information: [place] [address] [place] Follow up.   Why: Please call our office to schedule an appointment. We would like to see you in 7-10 days. Contact information: [place] [address] [phone]                 Hospital Course by problem list:  Embolic stroke Seizure-like activity Patient presented with seizure-like activity and a syncopal episode. MRI showed right occipital, right MCA/ACA  infarcts. CTA head and neck showed 60% right ICA origin stenosis and moderate stenosis of the left ICA. She was initially started on Keppra which was later discontinued when EEG showed no evidence of seizure. Further cardiac work-up showed newly diagnosed heart failure with reduced EF of 30-35%. TEE showed no evidence of thrombus however agitated saline showed possible small PFO with right to left intracardiac shunt. It was suspected that her stroke was thromboembolic in etiology with her right ICA origin stenosis and her stroke was right-sided. She was started on aspirin  and Plavix for DAPT as well as Crestor. She remained neurologically intact with no focal weakness or deficit. No recurrent episodes of seizure-like activity. She was followed closely by the neurology team during this admission. DAPT continued at time of discharge.   Acute kidney injury Patient had sudden increase in creatinine, from 1.43 to 3.43. Baseline appears to be around 0.8. Etiology of AKI was unclear however initially thought to be related to hypotension in the setting of sedation for her TEE. Kidney function continued to decline despite gentle hydration. More aggressive hydration was limited by her newly diagnosed HFrEF. Patient also had poor PO intake during this admission, possibly contributing to her AKI due to dehydration. Renal US showed no obstruction. Further urine studies showed FeNa of 2.6% indicating intrinsic pattern of kidney injury. Etiology of her AKI is likely multifactorial and possibly drug-induced due to brief course of Keppra which she was given during this admission for her seizure-like activities, worsened by cardiorenal syndrome with newly diagnosed heart failure and hypotension in the setting of sedation for her TEE procedure. Encouraged good PO intake and patient will require close outpatient follow-up for repeat labs to monitor her kidney function. Jardiance, spironolactone, and losartan as well as Odefsy were  held due to AKI and hyperkalemia. Doses for Crestor and ciprofloxacin were adjusted as well in light of AKI.   Hyperkalemia Potassium was elevated up to 7.5 during this admission. She had been receiving potassium supplement due to hypokalemia earlier in this admission. She was also on spironolactone, losartan, and Jardiance with her newly diagnosed HFrEF.EKG showed wide T waves and prolonged PR interval. Patient remained asymptomatic with no chest pain or palpitations. She was immediately started on IV calcium gluconate, 5 units of Novolog with dextrose. She was also receiving her insulin regimen of Semglee and SSI for her diabetes and additionally received Lokelma as well as one time albuterol nebulizer. Potassium improved to 4.3 at time of discharge. Hyperkalemia may have been exacerbated by her AKI in addition to her potassium supplement and potassium-sparing medications.   HFrEF (30-35%) Newly diagnosed HFrEF (30-35%) noted on echocardiogram. Unclear etiology of her heart failure but possibly due to hypertension. Patient was asymptomatic and euvolemic. She was scheduled for ischemic evaluation with CTA however was canceled due to her AKI. Will require close follow-up with cardiology to be initiated on GDMT for her newly diagnosed heart failure. Her Jardiance, losartan, and spironolactone were held during this admission due to her AKI. Carvedilol was held at times as well due to hypotension however blood pressure remained stable and she was restarted on this at time of discharge.   Post-appendectomy abdominal abscess Patient had recent appendectomy with post-procedure retrocecal abscess. She is s/p drain placement with drain exchange on 06/01. She was continued on antibiotics (ciprofloxacin and metronidazole x2 weeks per ID recommendation) during this admission. No fever or leukocytosis. She will require close follow-up with IR after discharge for repeat imaging and possible drain injection. Her  ciprofloxacin dose was adjusted from 750 mg BID to 750 mg daily due to her AKI and will require adjustment once her AKI resolves.   HIV Patient was continued on her Odefsy during this admission however was discontinued in the setting of her AKI with GFR less than 20. Will need to be restarted once her kidney function improves. Last viral load was undetectable in March 2023.   Hypertension Initial permissive hypertension with blood pressure up to 170/102 in light of embolic stroke. Blood pressure control was obtained during  this admission and was stable at time of discharge. Carvedilol was stopped due to hypotension which was later restarted once blood pressure improved. She was discharged on carvedilol for blood pressure control.   Type 2 Diabetes mellitus A1c was 14.2 during this admission. She was on Lantus 30 units and Synjardy at home. She was continued on Jardiance as well as Novolog and Semglee during this admission. Vania Rea was held however due to AKI as noted above but was continued on Novolog and Semglee. She will require close follow-up to optimize her diabetes management once her AKI resolves.     Discharge Subjective:  Patient is feeling well today with no chest pain, shortness of breath, abdominal pain, nauseas, vomiting, or diarrhea. She has been attempting to increase her PO intake. She denies any focal weakness, dizziness, or headache.   Discharge Exam:   BP 130/76 (BP Location: Right Arm)   Pulse 99   Temp 98.6 F (37 C) (Oral)   Resp 14   Ht 5' 2" (1.575 m)   Wt 63.5 kg   SpO2 100%   BMI 25.61 kg/m   Physical Exam:  Physical Exam  Constitutional:  Alert and oriented x3. In no acute distress and resting comfortably.  Cardiovascular:  Regular rate and rhythm. No lower extremity swelling.  Respiratory:  No increased work of breathing. Lungs are clear to auscultation.  Abdominal:  Normal bowel sounds present. No tenderness to palpation.  Extremities:  No asymmetry  noted to the lower extremities.  Skin:  No obvious lesion noted Psych:  Normal mood and behavior.   Pertinent Labs, Studies, and Procedures:     Latest Ref Rng & Units 07/27/2021    3:06 AM 07/26/2021    1:16 AM 07/25/2021    3:52 PM  CMP  Glucose 70 - 99 mg/dL 109  139  180   BUN 6 - 20 mg/dL _0 Creatinine 0.44 - 1.00 mg/dL 3.43  3.39  3.50   Sodium 135 - 145 mmol/L 137  136  137   Potassium 3.5 - 5.1 mmol/L 4.3  4.3  4.9   Chloride 98 - 111 mmol/L 109  107  108   CO2 22 - 32 mmol/L _1 Calcium 8.9 - 10.3 mg/dL 8.7  8.9  9.2        Latest Ref Rng & Units 07/27/2021    3:06 AM 07/26/2021    1:16 AM 07/25/2021    4:19 AM  CBC  WBC 4.0 - 10.5 K/uL 5.6  6.0  5.8   Hemoglobin 12.0 - 15.0 g/dL 9.0  8.5  9.0   Hematocrit 36.0 - 46.0 % 29.6  27.9  29.3   Platelets 150 - 400 K/uL 488  448  464     Iron/TIBC/Ferritin/ %Sat    Component Value Date/Time   IRON 64 07/20/2021 1234   IRON 52 12/21/2020 1145   TIBC 228 (L) 07/20/2021 1234   TIBC 295 12/21/2020 1145   FERRITIN 138 07/20/2021 1234   FERRITIN 414 (H) 12/21/2020 1145   IRONPCTSAT 28 07/20/2021 1234   IRONPCTSAT 18 12/21/2020 1145    Lipid Panel     Component Value Date/Time   CHOL 148 07/19/2021 0359   TRIG 118 07/19/2021 0359   HDL 34 (L) 07/19/2021 0359   CHOLHDL 4.4 07/19/2021 0359   VLDL 24 07/19/2021 0359   LDLCALC 90 07/19/2021 0359   LDLCALC 127 (H) 09/06/2019 1528  A1c: 14.2  Imaging:  MR Brain W WO Contrast 07/19/2021: IMPRESSION: 1. 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 No associated hemorrhage or mass effect. 2. No other acute intracranial abnormality. 3. Underlying mild chronic microvascular ischemic disease.  CTA Head Neck 07/19/2021: IMPRESSION: 1. Negative for large vessel occlusion. But positive for questionable filling defect - nonocclusive thrombus in the supraclinoid Right ICA (series 10, image 58).    2. Underlying age advanced atherosclerosis at both carotid bifurcations and ICA siphons. 60% Right ICA origin stenosis. Moderate stenosis of the Left ICA cavernous segment.   3. Mild irregularity at the left MCA and ACA origins without significant stenosis. No posterior circulation stenosis.   4. 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 Recommend follow-up Chest CT with IV contrast.   5. Aortic Atherosclerosis (ICD10-I70.0).  Echo TEE 07/23/2021: IMPRESSIONS   1. Left ventricular ejection fraction, by estimation, is 30 to 35%. The left ventricle has moderately decreased function. The left ventricle demonstrates global hypokinesis.   2. Right ventricular systolic function is normal. The right ventricular size is normal.   3. No left atrial/left atrial appendage thrombus was detected.   4. The mitral valve is normal in structure. Trivial mitral valve regurgitation. No evidence of mitral stenosis.   5. The aortic valve is tricuspid. Aortic valve regurgitation is not visualized. No aortic stenosis is present.   6. There is mild (Grade II) plaque involving the descending aorta.   7. Cannot exclude a small PFO. trivial right to left shunt.   US Renal 07/25/2021: IMPRESSION: Negative for obstructive uropathy.  Discharge Instructions: Discharge Instructions     (HEART FAILURE PATIENTS) Call MD:  Anytime you have any of the following symptoms: 1) 3 pound weight gain in 24 hours or 5 pounds in 1 week 2) shortness of breath, with or without a dry hacking cough 3) swelling in the hands, feet or stomach 4) if you have to sleep on extra pillows at night in order to breathe.   Complete by: As directed    AMB referral to CHF clinic   Complete by: As directed    Call MD for:  difficulty breathing, headache or visual disturbances   Complete by: As directed    Call MD for:  persistant dizziness or  light-headedness   Complete by: As directed    Call MD for:  persistant nausea and vomiting   Complete by: As directed    Call MD for:  redness, tenderness, or signs of infection (pain, swelling, redness, odor or green/yellow discharge around incision site)   Complete by: As directed    Call MD for:  temperature >100.4   Complete by: As directed    Discharge instructions   Complete by: As directed    Hello Ms. Roseman,   Thank you for allowing [place] to be part of your care during this hospital admission.   As you know you were hospitalized for your stroke. We believe the stroke was caused by a plaque that was noted in the blood vessels that supply blood to your brain. Some of our work-up for your stroke also showed that your heart is weak and at this time, the cause of your weak heart is not clear but may be related to high blood pressure.   During this admission, your kidney function worsened and we believe this may be related to a medicine called Keppra which you  were given for your seizure-like activity. Thankfully, it does not seem like you had a true seizure and the medication was stopped.   Additionally, your A1c, which is the number that indicates how well your diabetes is controlled, was quite high at 14.2.   [facility] will call you for a close follow-up appointment. At this visit, we will help you set up appointments with a neurologist for your stroke, a cardiologist for your heart failure, and your infectious disease doctor for your HIV.   The following are your new medications or medications that have been changed: 1. Please hold your Louann Sjogren, and Odefsy until your kidney function improves. You may restart once your kidney function improves and your primary care provider and infectious disease doctor approves.  2. Please start taking Carvedilol which will help your heart and your blood pressure.  3. Your new antibiotics for your abscess are Cipro  (ciprofloxacin) which you will take once a day until your kidney function improves and Flagyl (metronidazole) twice a day. You will have one more dose of Flagyl (metronidazole) to take when you get home tonight to finish today's doses. Please stop taking any other antibiotics that you were taking prior to this hospitalization. 4. Please start taking aspirin 81 mg once a day and Plavix 75 mg once a day for 21 days which will help with the plaque that likely caused your stroke. You will also take Crestor for your cholesterol once a day.  My best, Dr. Marlou Sa   Discharge wound care:   Complete by: As directed    Keep dressing clean and dry. Replace soiled dressing as needed.   Increase activity slowly   Complete by: As directed      Hello Ms. Avilla,   Thank you for allowing [place] to be part of your care during this hospital admission.   As you know you were hospitalized for your stroke. We believe the stroke was caused by a plaque that was noted in the blood vessels that supply blood to your brain. Some of our work-up for your stroke also showed that your heart is weak and at this time, the cause of your weak heart is not clear but may be related to high blood pressure.   During this admission, your kidney function worsened and we believe this may be related to a medicine called Keppra which you were given for your seizure-like activity. Thankfully, it does not seem like you had a true seizure and the medication was stopped.   Additionally, your A1c, which is the number that indicates how well your diabetes is controlled, was quite high at 14.2.   [facility] will call you for a close follow-up appointment. At this visit, we will help you set up appointments with a neurologist for your stroke, a cardiologist for your heart failure, and your infectious disease doctor for your HIV.   The following are your new medications or medications that have been changed: Please hold your Synjardy,  Hyzaar, and Odefsy until your kidney function improves. You may restart once your kidney function improves and your primary care provider and infectious disease doctor approves.  Please start taking Carvedilol which will help your heart and your blood pressure.  Your new antibiotics for your abscess are Cipro (ciprofloxacin) which you will take once a day until your kidney function improves and Flagyl (metronidazole) twice a day. You will have one more dose of Flagyl (metronidazole) to take when you get home  tonight to finish today's doses. Please stop taking any other antibiotics that you were taking prior to this hospitalization. Please start taking aspirin 81 mg once a day and Plavix 75 mg once a day for 21 days which will help with the plaque that likely caused your stroke. You will also take Crestor for your cholesterol once a day.  Signed: Farrel Gordon, DO 07/27/2021, 3:42 PM

## 2021-07-27 NOTE — Progress Notes (Signed)
Supervising Physician: Ruel Favors  Patient Status:  [facility] - In-pt  Chief Complaint: Laparoscopic appendectomy with post-procedure retrocecal abscess s/p drain placement in IR (At [facility]) followed by drain exchange/upsize 07/19/21 by Dr. Deanne Coffer  Subjective: Patient in bed awake/alert and without complaints. She is being discharge home today.   Allergies: Fluorescein  Medications: Prior to Admission medications   Medication Sig Start Date End Date Taking? Authorizing Provider  amoxicillin-clavulanate (AUGMENTIN) 875-125 MG tablet Take 1 tablet by mouth See admin instructions. Bid x 14 days 07/12/21  Yes [provider]  Cholecalciferol (VITAMIN D3) 5000 units TABS Take 5,000 Units by mouth daily.   Yes [provider]  ciprofloxacin (CIPRO) 750 MG tablet Take 750 mg by mouth See admin instructions. Bid x 14 days 07/12/21  Yes [provider]  DULoxetine (CYMBALTA) 60 MG capsule Take 1 capsule (60 mg total) by mouth daily. 05/23/21 09/22/21 Yes Demaio, Alexa, MD  Empagliflozin-metFORMIN HCl (SYNJARDY) 5-500 MG TABS Take 1 tablet by mouth 2 (two) times daily. 07/18/21  Yes Demaio, Alexa, MD  emtricitabine-rilpivir-tenofovir AF (ODEFSEY) 200-25-25 MG TABS tablet Take 1 tablet by mouth daily with breakfast. 07/18/21  Yes Demaio, Alexa, MD  losartan-hydrochlorothiazide (HYZAAR) 50-12.5 MG tablet Take 1 tablet by mouth daily. 05/08/21  Yes Carmel Sacramento, MD  Accu-Chek Softclix Lancets lancets Check blood sugar 3 times per day 05/23/21   Ilene Qua, MD  aspirin EC 81 MG tablet Take 1 tablet (81 mg total) by mouth daily. Swallow whole. 07/28/21   Champ Mungo, DO  B-D UF III MINI PEN NEEDLES 31G X 5 MM MISC Use to inject insulin one time daily 07/18/21   Demaio, Alexa, MD  Blood Glucose Monitoring Suppl (ACCU-CHEK GUIDE ME) w/Device KIT 1 kit by Does not apply route daily. Patient not taking: Reported on 03/16/2021 09/30/19   Kuppelweiser, Cassie L, RPH-CPP  Blood Glucose Monitoring  Suppl (ACCU-CHEK NANO SMARTVIEW) w/Device KIT 1 each by Does not apply route 2 (two) times daily. Check blood sugar 4x a day before meals and bedtime  2 days each week. Dx code- 250.00. 07/18/21   Demaio, Tyrone Apple, MD  ciprofloxacin (CIPRO) 500 MG tablet Take 1 tablet (500 mg total) by mouth daily for 6 days. 07/28/21 08/03/21  Champ Mungo, DO  clopidogrel (PLAVIX) 75 MG tablet Take 1 tablet (75 mg total) by mouth daily. 07/28/21   Champ Mungo, DO  glucose blood (ACCU-CHEK GUIDE) test strip Check blood sugar 3 times per day 07/18/21   Demaio, Alexa, MD  insulin glargine (LANTUS SOLOSTAR) 100 UNIT/ML Solostar Pen Inject 30 Units into the skin daily. 07/27/21 02/12/22  Champ Mungo, DO  Lancets Misc. (ACCU-CHEK SOFTCLIX LANCET DEV) KIT Use to check blood sugar up to 3 times a day before meals. 07/18/21   Demaio, Alexa, MD  metroNIDAZOLE (FLAGYL) 500 MG tablet Take 1 tablet (500 mg total) by mouth every 12 (twelve) hours for 7 days. 07/27/21 08/03/21  Champ Mungo, DO  rosuvastatin (CRESTOR) 10 MG tablet Take 1 tablet (10 mg total) by mouth daily. 07/28/21   Champ Mungo, DO     Vital Signs: BP (P) 123/75 (BP Location: Right Arm)   Pulse (P) 93   Temp (P) 98 F (36.7 C) (Oral)   Resp (P) 14   Ht 5\' 2"  (1.575 m)   Wt 140 lb (63.5 kg)   SpO2 (P) 100%   BMI 25.61 kg/m   Physical Exam Constitutional:      General: She is not in acute  distress. Pulmonary:     Effort: Pulmonary effort is normal.  Abdominal:     Palpations: Abdomen is soft.     Tenderness: There is no abdominal tenderness.     Comments: RLQ drain to gravity -  now switched to bulb. Approximately 50 ml of thin, tan fluid in bag. Dressing is lightly saturated with thin, tan fluid. Skin insertion site with scant drainage around the site. No pain/tenderness to palpation.   Skin:    General: Skin is warm and dry.  Neurological:     Mental Status: She is alert and oriented to person, place, and time.     Imaging: US RENAL  Result Date:  07/25/2021 CLINICAL DATA:  Acute kidney injury EXAM: RENAL / URINARY TRACT ULTRASOUND COMPLETE COMPARISON:  CT 07/18/2021 FINDINGS: Right Kidney: Renal measurements: 11.1 x 4.4 x 5.4 cm = volume: 138 mL. Echogenicity within normal limits. No mass, shadowing stone, or hydronephrosis visualized. Left Kidney: Renal measurements: 10.3 x 4.8 x 3.9 cm = volume: 101 mL. Echogenicity within normal limits. 8 mm lower pole left renal cyst. No follow-up required. No solid mass, shadowing stone, or hydronephrosis visualized. Bladder: Appears normal for degree of bladder distention. Other: None. IMPRESSION: Negative for obstructive uropathy. Electronically Signed   By: Davina Poke D.O.   On: 07/25/2021 11:12    Labs:  CBC: Recent Labs    07/24/21 0323 07/25/21 0419 07/26/21 0116 07/27/21 0306  WBC 6.6 5.8 6.0 5.6  HGB 9.2* 9.0* 8.5* 9.0*  HCT 30.4* 29.3* 27.9* 29.6*  PLT 469* 464* 448* 488*    COAGS: Recent Labs    07/20/21 0347  INR 1.1    BMP: Recent Labs    07/25/21 0419 07/25/21 1552 07/26/21 0116 07/27/21 0306  NA 136 137 136 137  K 4.9 4.9 4.3 4.3  CL 107 108 107 109  CO2 19* 20* 21* 23  GLUCOSE 129* 180* 139* 109*  BUN $Re'16 18 19 'RWP$ 22*  CALCIUM 9.3 9.2 8.9 8.7*  CREATININE 3.41* 3.50* 3.39* 3.43*  GFRNONAA 15* 15* 15* 15*    LIVER FUNCTION TESTS: Recent Labs    07/22/21 0232 07/23/21 0643 07/24/21 0323 07/25/21 0419  BILITOT 0.3 0.5 0.2* 0.4  AST $Re'26 20 20 22  'lrs$ ALT $R'24 22 19 19  'bq$ ALKPHOS 142* 123 132* 112  PROT 6.5 6.9 6.9 6.8  ALBUMIN 2.3* 2.6* 2.7* 2.7*    Assessment and Plan:  Laparoscopic appendectomy with post-procedure retrocecal abscess s/p drain placement in IR (At [facility]) followed by drain exchange/upsize 07/19/21 by Dr. Vernard Gambles  Patient with plans for discharge home today. Approximately 50 ml of thin/tan fluid in bag. Drain switched to a bulb in the hopes of decreasing the fluid leaking around the skin insertion site. Patient was given enough saline flushes to  last 2-3 weeks. She is familiar with flushing the drain, charging the bulb, documenting the daily output, etc. She knows to keep the site clean and dry and to change the dressing daily or as needed.   IR would like to follow up with the patient in 7-10 days. The patient is without a phone right now and she will call our outpatient clinic to set up an appointment. The number to our clinic is on her discharge paperwork. She knows she can call the clinic with any questions/concerns prior to her visit.   Electronically Signed: Soyla Dryer, AGACNP-BC [phone] 07/27/2021, 2:22 PM   I spent a total of 15 Minutes at the the patient's bedside AND on  the patient's hospital floor or unit, greater than 50% of which was counseling/coordinating care for RLQ drain.

## 2021-07-27 NOTE — TOC Initial Note (Signed)
Transition of Care [facility]) - Initial/Assessment Note    Patient Details  Name: Ashley Vaughn MRN: [id number] Date of Birth: Aug 21, 1965  [facility] CM/SW Contact:    Kermit Balo, RN Phone Number: 07/27/2021, 12:40 PM  Clinical Narrative:                 Patient is from home alone. She is planning to d/c to her daughters home initially after discharge at least for a few days. She states her daughter can provide transportation or she can use medicaid transport.  She doesn't have any DME at home currently. She is interested in the recommended DME. CM has ordered the walker and 3 in 1 through [employer] and it will be delivered to the room.  Home health arranged through [facility]. They will see the patient at the daughters home and then at home.  TOC following.  Expected Discharge Plan: Home w Home Health Services Barriers to Discharge: Continued Medical Work up   Patient Goals and CMS Choice   CMS Medicare.gov Compare Post Acute Care list provided to:: Patient Choice offered to / list presented to : Patient  Expected Discharge Plan and Services Expected Discharge Plan: Home w Home Health Services   Discharge Planning Services: CM Consult Post Acute Care Choice: Home Health, Durable Medical Equipment Living arrangements for the past 2 months: Apartment                           HH Arranged: PT, OT HH Agency: [facility] Date [facility] Agency Contacted: 07/27/21   Representative spoke with at [facility] Agency: Amy  Prior Living Arrangements/Services Living arrangements for the past 2 months: Apartment Lives with:: Self Patient language and need for interpreter reviewed:: Yes Do you feel safe going back to the place where you live?: Yes      Need for Family Participation in Patient Care: Yes (Comment) Care giver support system in place?: Yes (comment)   Criminal Activity/Legal Involvement Pertinent to Current Situation/Hospitalization: No - Comment as  needed  Activities of Daily Living Home Assistive Devices/Equipment: None ADL Screening (condition at time of admission) Patient's cognitive ability adequate to safely complete daily activities?: Yes Is the patient deaf or have difficulty hearing?: No Does the patient have difficulty seeing, even when wearing glasses/contacts?: No Does the patient have difficulty concentrating, remembering, or making decisions?: No Patient able to express need for assistance with ADLs?: No Does the patient have difficulty dressing or bathing?: No Independently performs ADLs?: Yes (appropriate for developmental age) Does the patient have difficulty walking or climbing stairs?: Yes Weakness of Legs: Both Weakness of Arms/Hands: Both  Permission Sought/Granted                  Emotional Assessment Appearance:: Appears stated age Attitude/Demeanor/Rapport: Engaged Affect (typically observed): Accepting Orientation: : Oriented to Self, Oriented to  Time, Oriented to Place, Oriented to Situation   Psych Involvement: No (comment)  Admission diagnosis:  Seizure (HCC) [R56.9] Other specified diabetes mellitus without complication, with long-term current use of insulin (HCC) [E13.9, Z79.4] HIV infection, unspecified symptom status (HCC) [B20] Patient Active Problem List   Diagnosis Date Noted   Postprocedural intraabdominal abscess 07/20/2021   CVA (cerebral vascular accident) (HCC) 07/20/2021   Cardiomyopathy (HCC)    Postoperative infection 07/18/2021   Seizure (HCC) 07/18/2021   S/P laparoscopic appendectomy 06/22/2021   Hypertension 12/21/2020   Fatigue 12/21/2020   HSV (herpes simplex virus)  anogenital infection 03/12/2019   Health care maintenance 03/18/2012   Major depressive disorder, single episode, severe (HCC) 01/05/2010   Human immunodeficiency virus (HIV) disease (HCC) 10/13/2008   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   Poorly controlled type 2 diabetes mellitus with neuropathy (HCC)  10/29/1999   PCP:  Andrey Campanile, MD Pharmacy:   [facility] [phone] - [place], [place] - [address] [place] [address] Phone: [phone] Fax: [phone]  [facility] [address] [facility] - [place], [place] - [address] [place] [address] Phone: [phone] Fax: [phone]  [facility] #[id number] [place], [place] - [address] AT [facility] [address] [place] [address] Phone: [phone] Fax: [phone]     Social Determinants of Health (SDOH) Interventions    Readmission Risk Interventions     No data to display

## 2021-07-27 NOTE — Progress Notes (Incomplete)
Progress Note  Patient Name: Ashley Vaughn Date of Encounter: 07/27/2021  [facility] Cardiologist: None   Subjective   Scr mildly worse this AM.BP normal  Inpatient Medications    Scheduled Meds:  aspirin EC  81 mg Oral Daily   ciprofloxacin  500 mg Oral Daily   clopidogrel  75 mg Oral Daily   DULoxetine  60 mg Oral Daily   feeding supplement  237 mL Oral BID BM   heparin  5,000 Units Subcutaneous Q8H   insulin aspart  0-15 Units Subcutaneous TID WC   insulin aspart  0-5 Units Subcutaneous QHS   insulin glargine-yfgn  20 Units Subcutaneous Daily   metroNIDAZOLE  500 mg Oral Q12H   rosuvastatin  10 mg Oral Daily   sodium chloride flush  5 mL Intracatheter Q8H   Continuous Infusions:  PRN Meds: acetaminophen, LORazepam, polyethylene glycol   Vital Signs    Vitals:   07/26/21 2016 07/26/21 2346 07/27/21 0329 07/27/21 0805  BP: 132/79 129/78 130/80 111/72  Pulse: 96 91 93 96  Resp: 14 18 14 12   Temp: 99.8 F (37.7 C) 99.5 F (37.5 C) 98 F (36.7 C) 98.1 F (36.7 C)  TempSrc: Oral Oral Oral Oral  SpO2: 100% 98% 100% 100%  Weight:      Height:        Intake/Output Summary (Last 24 hours) at 07/27/2021 0846 Last data filed at 07/27/2021 0600 Gross per 24 hour  Intake 1139.93 ml  Output 910 ml  Net 229.93 ml      07/19/2021    4:17 PM 07/18/2021    8:30 AM 05/09/2021    2:47 PM  Last 3 Weights  Weight (lbs) 140 lb 141 lb 143 lb 9.6 oz  Weight (kg) 63.504 kg 63.957 kg 65.137 kg      Telemetry    *** - Personally Reviewed  ECG    *** - Personally Reviewed  Physical Exam  *** GEN: No acute distress.   Neck: No JVD Cardiac: RRR, no murmurs, rubs, or gallops.  Respiratory: Clear to auscultation bilaterally. GI: Soft, nontender, non-distended  MS: No edema; No deformity. Neuro:  Nonfocal  Psych: Normal affect   Labs    High Sensitivity Troponin:   Recent Labs  Lab 07/18/21 0010 07/18/21 0317  TROPONINIHS 10 16     Chemistry Recent Labs   Lab 07/23/21 0643 07/24/21 0323 07/24/21 0624 07/25/21 0419 07/25/21 1552 07/26/21 0116 07/27/21 0306  NA 138 135  --  136 137 136 137  K 5.0 6.7*   < > 4.9 4.9 4.3 4.3  CL 108 112*  --  107 108 107 109  CO2 20* 19*  --  19* 20* 21* 23  GLUCOSE 82 143*  --  129* 180* 139* 109*  BUN 9 17  --  16 18 19  22*  CREATININE 1.43* 2.75*  --  3.41* 3.50* 3.39* 3.43*  CALCIUM 9.0 8.8*  --  9.3 9.2 8.9 8.7*  PROT 6.9 6.9  --  6.8  --   --   --   ALBUMIN 2.6* 2.7*  --  2.7*  --   --   --   AST 20 20  --  22  --   --   --   ALT 22 19  --  19  --   --   --   ALKPHOS 123 132*  --  112  --   --   --   BILITOT 0.5  0.2*  --  0.4  --   --   --   GFRNONAA 43* 20*  --  15* 15* 15* 15*  ANIONGAP 10 4*  --  10 9 8 5    < > = values in this interval not displayed.    Lipids No results for input(s): "CHOL", "TRIG", "HDL", "LABVLDL", "LDLCALC", "CHOLHDL" in the last 168 hours.  Hematology Recent Labs  Lab 07/25/21 0419 07/26/21 0116 07/27/21 0306  WBC 5.8 6.0 5.6  RBC 4.12 3.89 4.15  HGB 9.0* 8.5* 9.0*  HCT 29.3* 27.9* 29.6*  MCV 71.1* 71.7* 71.3*  MCH 21.8* 21.9* 21.7*  MCHC 30.7 30.5 30.4  RDW 18.2* 18.1* 17.9*  PLT 464* 448* 488*   Thyroid No results for input(s): "TSH", "FREET4" in the last 168 hours.  BNPNo results for input(s): "BNP", "PROBNP" in the last 168 hours.  DDimer No results for input(s): "DDIMER" in the last 168 hours.   Radiology    09/26/21 RENAL  Result Date: 07/25/2021 CLINICAL DATA:  Acute kidney injury EXAM: RENAL / URINARY TRACT ULTRASOUND COMPLETE COMPARISON:  CT 07/18/2021 FINDINGS: Right Kidney: Renal measurements: 11.1 x 4.4 x 5.4 cm = volume: 138 mL. Echogenicity within normal limits. No mass, shadowing stone, or hydronephrosis visualized. Left Kidney: Renal measurements: 10.3 x 4.8 x 3.9 cm = volume: 101 mL. Echogenicity within normal limits. 8 mm lower pole left renal cyst. No follow-up required. No solid mass, shadowing stone, or hydronephrosis visualized. Bladder:  Appears normal for degree of bladder distention. Other: None. IMPRESSION: Negative for obstructive uropathy. Electronically Signed   By: 07/20/2021 D.O.   On: 07/25/2021 11:12    Cardiac Studies   ECHOCARDIOGRAM LIMITED 2021-08-18 1. No evidence of left ventricular thrombus with Definity contrast. Left ventricular ejection fraction, by estimation, is 30 to 35%. The left ventricle has moderately decreased function. The left ventricle demonstrates global hypokinesis. Conclusion(s)/Recommendation(s): Limited study for LV thrombus.  Patient Profile     56 y.o. female poorly controlled diabetes mellitus and hypertension, chronic HIV infection on antiretroviral therapies, anemia, intra-abdominal abscess following recent appendectomy, requiring percutaneous drain, admitted with seizure and found to have evidence of acute ischemic infarct scattered in the subcortical right cerebral hemisphere, suspicious for embolic event.  Noted to have severely depressed left ventricular systolic function on echocardiography, but without clinically evident heart failure.  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  Assessment & Plan    Cardiomyopathy Systolic HF - unclear etiology, possible from hypertension - euvolemic on exam - multiple RF for CAD - Kidney function worsening - Losartana dn Jardiance held - BB held for soft BP - Coronary CTA canceled due to AKI - GDMT limited by kideny fucntion and BP - further ischemic work-up as OP, once acute issues have resolved.   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

## 2021-07-27 NOTE — Progress Notes (Signed)
Pt wheeled off unit with all belongings.  

## 2021-07-30 ENCOUNTER — Telehealth: Payer: Self-pay | Admitting: *Deleted

## 2021-07-30 NOTE — Chronic Care Management (AMB) (Signed)
  Care Management   Note  07/30/2021 Name: DA MICHELLE MRN: [id number] DOB: December 06, 1965  ADABELLA STANIS is a 56 y.o. year old female who is a primary care patient of Andrey Campanile, MD. I reached out to Rhona Raider by phone today offer care coordination services.   Ms. Heinzelman was given information about care management services today including:  Care management services include personalized support from designated clinical staff supervised by her physician, including individualized plan of care and coordination with other care providers 24/7 contact phone numbers for assistance for urgent and routine care needs. The patient may stop care management services at any time by phone call to the office staff.  Patient agreed to services and verbal consent obtained.   Follow up plan: Telephone appointment with care management team member scheduled for:08/06/21  [facility] Guide, Embedded Care Coordination [facility]  Care Management  Direct Dial: [phone]

## 2021-08-06 ENCOUNTER — Telehealth: Payer: Medicaid Other

## 2021-08-08 ENCOUNTER — Encounter: Payer: Medicaid Other | Admitting: Internal Medicine

## 2021-08-09 ENCOUNTER — Encounter: Payer: Medicaid Other | Admitting: Internal Medicine

## 2021-08-09 ENCOUNTER — Ambulatory Visit
Admission: RE | Admit: 2021-08-09 | Discharge: 2021-08-09 | Disposition: A | Discharge status: Home / Self Care | Payer: Medicaid Other | Source: Ambulatory Visit | Attending: Pulmonary Disease | Admitting: Pulmonary Disease

## 2021-08-09 ENCOUNTER — Other Ambulatory Visit: Payer: Self-pay | Admitting: Internal Medicine

## 2021-08-09 DIAGNOSIS — K3533 Acute appendicitis with perforation and localized peritonitis, with abscess: Secondary | ICD-10-CM

## 2021-08-09 HISTORY — PX: IR RADIOLOGIST EVAL & MGMT: IMG5224

## 2021-08-11 ENCOUNTER — Encounter: Payer: Self-pay | Admitting: *Deleted

## 2021-08-14 ENCOUNTER — Telehealth: Payer: Medicaid Other | Admitting: Student

## 2021-08-16 ENCOUNTER — Telehealth: Payer: Self-pay | Admitting: Licensed Clinical Social Worker

## 2021-08-16 ENCOUNTER — Ambulatory Visit: Payer: Self-pay | Admitting: Licensed Clinical Social Worker

## 2021-08-16 NOTE — Patient Instructions (Signed)
Visit Information  Instructions: patient will work with SW to address concerns related to [facility]  Patient was given the following information about care management and care coordination services today, agreed to services, and gave verbal consent: 1.care management/care coordination services include personalized support from designated clinical staff supervised by their physician, including individualized plan of care and coordination with other care providers 2. 24/7 contact phone numbers for assistance for urgent and routine care needs. 3. The patient may stop care management/care coordination services at any time by phone call to the office staff.  Patient verbalizes understanding of instructions and care plan provided today and agrees to view in MyChart. Active MyChart status and patient understanding of how to access instructions and care plan via MyChart confirmed with patient.     Follow up scheduled for 07/10 at 10:30am.  Ander Gaster , MSW Social Worker [facility] Care Management  [phone]

## 2021-08-16 NOTE — Telephone Encounter (Signed)
  Care Management   Follow Up Note   08/16/2021 Name: Ashley Vaughn MRN: [id number] DOB: [birth year]   Referred by: Marrianne Mood, MD Reason for referral : No chief complaint on file.   A second unsuccessful telephone outreach was attempted today. The patient was referred to the case management team for assistance with care management and care coordination.   SW attempted patient and patients daughter on today. Unable to leave a VM due to VM not set up.   SW first attempt was on 06/19. Sw unable to leave a VM at that time also.   Follow Up Plan: The care management team will reach out to the patient again over the next 30 days.   Ander Gaster, MSW  Social Worker [facility] Care Management  [phone]

## 2021-08-16 NOTE — Chronic Care Management (AMB) (Addendum)
      Care Management   Social Work Visit Note  08/16/2021 Name: Ashley Vaughn MRN: [id number] DOB: 04-Feb-1966  MARI BATTAGLIA is a 56 y.o. year old female who sees Marrianne Mood, MD for primary care. The care management team was consulted for assistance with care management and care coordination needs related to [facility] Resources    Patient was given the following information about care management and care coordination services today, agreed to services, and gave verbal consent: 1.care management/care coordination services include personalized support from designated clinical staff supervised by their physician, including individualized plan of care and coordination with other care providers 2. 24/7 contact phone numbers for assistance for urgent and routine care needs. 3. The patient may stop care management/care coordination services at any time by phone call to the office staff.  Engaged with patient by telephone for initial visit in response to provider referral for social work chronic care management and care coordination services.  Assessment: Review of patient history, allergies, and health status during evaluation of patient need for care management/care coordination services.    Interventions:  Patient returned SW call and apologized for missing phone calls. Patient already has stable housing through [employer]. Patient stated housing is not a concern. Patient needs 5-mattresses. Patient already receiving assistance with two mattresses through [employer]. SW discussed [employer]. SW advised patient SW will research and follow up on 07/10 at 10:30 am.  SW completed SDOH and needs assessment. No further needs are present.  Patient denied pain and needing to speak with RNCM.  SDOH (Social Determinants of Health) assessments performed: Yes     Plan:  SW will follow up on 08/27/2021 at 10:30 am.   Ander Gaster, MSW  Social Worker [facility] Care  Management  [phone]

## 2021-08-23 ENCOUNTER — Encounter: Payer: Self-pay | Admitting: Internal Medicine

## 2021-08-23 ENCOUNTER — Other Ambulatory Visit: Payer: Self-pay

## 2021-08-23 ENCOUNTER — Ambulatory Visit (INDEPENDENT_AMBULATORY_CARE_PROVIDER_SITE_OTHER): Payer: Medicaid Other | Admitting: Internal Medicine

## 2021-08-23 ENCOUNTER — Ambulatory Visit: Payer: Medicaid Other | Admitting: Infectious Diseases

## 2021-08-23 VITALS — BP 110/63 | HR 96 | Temp 97.6°F | Ht 62.0 in | Wt 128.2 lb

## 2021-08-23 DIAGNOSIS — I5020 Unspecified systolic (congestive) heart failure: Secondary | ICD-10-CM | POA: Diagnosis not present

## 2021-08-23 DIAGNOSIS — B20 Human immunodeficiency virus [HIV] disease: Secondary | ICD-10-CM

## 2021-08-23 DIAGNOSIS — E1165 Type 2 diabetes mellitus with hyperglycemia: Secondary | ICD-10-CM | POA: Diagnosis not present

## 2021-08-23 DIAGNOSIS — Z59819 Housing instability, housed unspecified: Secondary | ICD-10-CM

## 2021-08-23 DIAGNOSIS — T8143XA Infection following a procedure, organ and space surgical site, initial encounter: Secondary | ICD-10-CM

## 2021-08-23 DIAGNOSIS — K6811 Postprocedural retroperitoneal abscess: Secondary | ICD-10-CM | POA: Diagnosis not present

## 2021-08-23 DIAGNOSIS — I639 Cerebral infarction, unspecified: Secondary | ICD-10-CM | POA: Diagnosis present

## 2021-08-23 DIAGNOSIS — N179 Acute kidney failure, unspecified: Secondary | ICD-10-CM | POA: Diagnosis not present

## 2021-08-23 DIAGNOSIS — K5900 Constipation, unspecified: Secondary | ICD-10-CM | POA: Diagnosis not present

## 2021-08-23 DIAGNOSIS — Z794 Long term (current) use of insulin: Secondary | ICD-10-CM | POA: Diagnosis not present

## 2021-08-23 DIAGNOSIS — E1140 Type 2 diabetes mellitus with diabetic neuropathy, unspecified: Secondary | ICD-10-CM | POA: Diagnosis not present

## 2021-08-23 MED ORDER — ACCU-CHEK SOFTCLIX LANCET DEV KIT: PACK | 1 refills | Status: DC

## 2021-08-23 MED ORDER — ACCU-CHEK SOFTCLIX LANCETS MISC
3 refills | Status: DC
Start: 2021-08-23 — End: 2021-09-06

## 2021-08-23 NOTE — Patient Instructions (Signed)
Thank you, Ms.Faryal KINNLEY PAULSON for allowing [place] to provide your care today. Today we discussed:  Diabetes: I gave you a glucometer. Please always make sure you are checkin blood sugar prior to injecting insulin. Call the clinic to let us know what type of insulin you have been injecting.  Kidneys: Getting lab work for this. I will call you with results.  Heart failure:  I would like to start medications for this but kidney function needs to be better.  I have ordered the following labs for you:   Lab Orders         BMP8+Anion Gap       Referrals ordered today:   Referral Orders  No referral(s) requested today     I have ordered the following medication/changed the following medications:   Stop the following medications: There are no discontinued medications.   Start the following medications: No orders of the defined types were placed in this encounter.    Follow up:  1 week    Remember:  call the clinic this afternoon to let us know what type of insulin you are injecting  We look forward to seeing you next time. Please call our clinic at [phone] if you have any questions or concerns. The best time to call is Monday-Friday from 9am-4pm, but there is someone available 24/7. If after hours or the weekend, call the main hospital number and ask for the Internal Medicine Resident On-Call. If you need medication refills, please notify your pharmacy one week in advance and they will send [place] a request.   Thank you for trusting me with your care. Wishing you the best!   Rudene Christians, DO [facility]

## 2021-08-23 NOTE — Progress Notes (Addendum)
Subjective:  CC: hospital follow-up  HPI:  [name] is a 56 y.o. 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  Her hospital course was complicated by an AKI and she was unable to undergo ischemic evaluation.  She was not able to start GDMT for heart failure and HIV medications were held due to worsening renal function at discharge.  Since discharge she has not been checking her blood sugar, but has continued injecting insulin. Please see problem based assessment and plan for additional details.  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   Seizure ([place]) 07/18/2021    Current Outpatient Medications on File Prior to Visit  Medication Sig Dispense Refill   aspirin EC 81 MG tablet Take 1 tablet (81 mg total) by mouth daily. Swallow whole. 30 tablet 12   B-D UF III MINI PEN NEEDLES 31G X 5 MM MISC Use to inject insulin one time daily 100 each 3   Blood Glucose Monitoring Suppl (ACCU-CHEK GUIDE ME) w/Device KIT 1 kit by Does not apply route daily. (Patient not taking: Reported on 03/16/2021) 1 kit 0   Blood Glucose Monitoring  Suppl (ACCU-CHEK NANO SMARTVIEW) w/Device KIT 1 each by Does not apply route 2 (two) times daily. Check blood sugar 4x a day before meals and bedtime  2 days each week. Dx code- 250.00. 1 kit 0   carvedilol (COREG) 6.25 MG tablet Take 1 tablet (6.25 mg total) by mouth 2 (two) times daily. 60 tablet 11   Cholecalciferol (VITAMIN D3) 5000 units TABS Take 5,000 Units by mouth daily.     clopidogrel (PLAVIX) 75 MG tablet Take 1 tablet (75 mg total) by mouth daily. 21 tablet 0   DULoxetine (CYMBALTA) 60 MG capsule Take 1 capsule (60 mg total) by mouth daily. 30 capsule 0   glucose blood (ACCU-CHEK GUIDE) test strip Check blood sugar 3 times per day 270 each 3   insulin glargine (LANTUS SOLOSTAR) 100 UNIT/ML Solostar Pen Inject 30 Units into the skin daily. 15 mL 3   rosuvastatin (CRESTOR) 10 MG tablet Take 1 tablet (10 mg total) by mouth daily. 30 tablet 2   No current facility-administered medications on file prior to visit.    Family History  Problem Relation Age of Onset   Diabetes Mother    Hypertension Mother    Heart disease Father    Arthritis Father    Diabetes Maternal Uncle     Social History   Socioeconomic History   Marital status: Legally Separated    Spouse name: Not on file   Number of children: Not on file  Years of education: Not on file   Highest education level: Not on file  Occupational History   Not on file  Tobacco Use   Smoking status: Never    Passive exposure: Current   Smokeless tobacco: Never  Vaping Use   Vaping Use: Never used  Substance and Sexual Activity   Alcohol use: Yes    Alcohol/week: 0.0 standard drinks of alcohol    Comment: occasional   Drug use: No    Comment: h/o cocaine abuse, clean since 2009   Sexual activity: Never    Partners: Male    Comment: pt. declined condoms  Other Topics Concern   Not on file  Social History Narrative   Not on file   Social Determinants of Health   Financial Resource Strain: High Risk (05/23/2021)    Overall Financial Resource Strain (CARDIA)    Difficulty of Paying Living Expenses: Very hard  Food Insecurity: Food Insecurity Present (05/23/2021)   Hunger Vital Sign    Worried About Running Out of Food in the Last Year: Often true    Ran Out of Food in the Last Year: Often true  Transportation Needs: No Transportation Needs (08/16/2021)   PRAPARE - Hydrologist (Medical): No    Lack of Transportation (Non-Medical): No  Physical Activity: Not on file  Stress: Not on file  Social Connections: Not on file  Intimate Partner Violence: Not on file    Review of Systems: ROS negative except for what is noted on the assessment and plan.  Objective:   Vitals:   08/23/21 1345  BP: 110/63  Pulse: 96  Temp: 97.6 F (36.4 C)  TempSrc: Oral  SpO2: 100%  Weight: 128 lb 3.2 oz (58.2 kg)  Height: $Remove'5\' 2"'ZVOEGBH$  (1.575 m)    Physical Exam: Constitutional: Chronically ill appearing, in no acute distress, sitting in wheelchair Cardiovascular: regular rate and rhythm, no m/r/g, no JVD Pulmonary/Chest: normal work of breathing on room air, lungs clear to auscultation bilaterally Abdominal: soft, non-tender, non-distended MSK: no peripheral edema to lower extremities bilaterally Neurological: alert & oriented x 3, deviation of tongue to right otherwise CN 2-12 intact, sensation intact bilaterally, muscle strength 5/5 in upper and lower extremities.   Assessment & Plan:  CVA (cerebral vascular accident) [facility]) Patient with embolic CVA May 31.  Imaging showed multiple small embolic strokes with possible small clot internal right ICA.  Echo showed newly diagnosed low ejection fraction with strong possibility of cardiogenic embolism.  TEE showed no evidence of clot or vegetation.  With her history of anemia decision was made to hold off on anticoagulation for now and to continue antiplatelet therapy alone.  Neurology recommended single agent therapy with Plavix 75 mg.  She will  need to follow-up with [facility] sometime within the next couple of weeks. A/P: Patient is to be on Plavix 75 mg alone for antiplatelet therapy.  Decision made not to start anticoagulation due to severe anemia.  Aspirin was not started due to bleeding risk. -crestor decreased to 10 due to renal function -Message sent to Dr. Leonie Man for follow-up after hospitalization  Housing insecurity Patient presented in person for telehealth appointment.  She states that she is currently living with her daughter.  [employer] paperwork received and will complete this paperwork.  Postprocedural intraabdominal abscess Patient had appendectomy complicated by retrocecal abscess status post drain placement.  Repeat imaging on June 22 showed interval reduction/near resolution of intraperitoneal component of the pericecal abscess.  Patient is unsure of follow-up plans with drain. On exam skin surrounding drain is not erythematous, drain has no output in bulb. A/P: -she needs to follow-up in radiology clinic. 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 -presenting to ED due to Elevated glucose with AGMA, will need close follow-up after this  Human immunodeficiency virus (HIV) disease ([place]) Patient with history of HIV.  She follows with [facility] with Dr. Johnnye Vaughn. Emtricitabine/ Rilpivirine/ Tenofovir was held during admission due to AKI.  Patient states that even though discharge summary stated to not take medication that she has been taking medication about every other day.  She was scheduled for follow-up with our [facility] today but was unaware of appointment. A/P: Follow-up with Dr. Johnnye Vaughn in internal medicine clinic on July 18. Renal function is improved on BMP  Poorly controlled type 2 diabetes mellitus with neuropathy [facility]) Patient admitted to hospital due to embolic CVA.  Hemoglobin A1c found to be at 14.2.   She had been taking Lantus 30 units nightly prior to admission.  Discharge orders show continued nation of Lantus, however instructions state NovoLog and Semglee.  Patient is unsure of what kind of insulin she has at home.  She does not have a glucometer and has been injecting insulin without checking blood sugars.  She has been injecting 10 units of unknown type of insulin before meals since getting out of the hospital.  She does have a known history of DKA.  She denies blurry vision and polyuria at this time. A/P: BMP showed glucose greater than 700 with anion gap of 20.  I called and talked with patient's daughter, who works at [employer].  She is planning to leave work now to bring her mother to the emergency room.  She states that her mom is not adherent with diet or injecting insulin despite her encouragement to do so.  She works during the day and is not able to assist with insulin dosing. -Reached patient's daughter and asked to bring to the emergency room after results of BMP -start metformin-empagliflozin   HFrEF (heart failure with reduced ejection fraction) ([place]) TEE showed EF of 30 to 35% with global hypokinesis of left ventricle.  Cardiology consulted due to new found reduced ejection fraction.  Unsure etiology, but suspected that insufficiently treated hypertension led to reduced EF.  Coronary CTA was canceled while inpatient due to a bump in serum creatinine. A/P: BMP showed improvement in creatinine.  Creatinine improved from 3.43-1.16. Referral to cardiology, she requires further evaluation for possible ischemic etiology. -start metformin- empagliflozin   Constipation Large stool burden seen onCT abdomen. A/P: Not able to focus on this problem, follow-up at next visit when able.  AKI (acute kidney injury) ([place]) AKI thought to be from drug-induced versus cardiorenal syndrome.  She was not able to complete ischemic evaluation for newly found HFrEF due to worsening AKI and GDMT was  not initiated.  Creatinine improved from 3.43-1.16. A/P: BMP showed improvement in renal function but showed signs of DKA with AGMA. Will start metformin-empagliflozin, but will hold off on additional medication changes at this time until follow-up.   Patient discussed with Dr. Brent General Bralen Wiltgen, D.O. [facility]  PGY-2 Pager: [phone]  Phone: [phone] Date 08/24/2021  Time 8:47 AM

## 2021-08-24 ENCOUNTER — Encounter (HOSPITAL_COMMUNITY): Payer: Self-pay

## 2021-08-24 ENCOUNTER — Emergency Department (HOSPITAL_COMMUNITY)
Admission: EM | Admit: 2021-08-24 | Discharge: 2021-08-24 | Disposition: A | Discharge status: Home / Self Care | Payer: Medicaid Other | Attending: Emergency Medicine | Admitting: Emergency Medicine

## 2021-08-24 ENCOUNTER — Other Ambulatory Visit: Payer: Self-pay

## 2021-08-24 ENCOUNTER — Encounter: Payer: Self-pay | Admitting: Internal Medicine

## 2021-08-24 DIAGNOSIS — Z7982 Long term (current) use of aspirin: Secondary | ICD-10-CM | POA: Insufficient documentation

## 2021-08-24 DIAGNOSIS — D649 Anemia, unspecified: Secondary | ICD-10-CM | POA: Diagnosis not present

## 2021-08-24 DIAGNOSIS — R410 Disorientation, unspecified: Secondary | ICD-10-CM | POA: Diagnosis not present

## 2021-08-24 DIAGNOSIS — Z79899 Other long term (current) drug therapy: Secondary | ICD-10-CM | POA: Diagnosis not present

## 2021-08-24 DIAGNOSIS — Z7984 Long term (current) use of oral hypoglycemic drugs: Secondary | ICD-10-CM | POA: Insufficient documentation

## 2021-08-24 DIAGNOSIS — Z7902 Long term (current) use of antithrombotics/antiplatelets: Secondary | ICD-10-CM | POA: Insufficient documentation

## 2021-08-24 DIAGNOSIS — N179 Acute kidney failure, unspecified: Secondary | ICD-10-CM

## 2021-08-24 DIAGNOSIS — E1165 Type 2 diabetes mellitus with hyperglycemia: Secondary | ICD-10-CM | POA: Diagnosis not present

## 2021-08-24 DIAGNOSIS — K5900 Constipation, unspecified: Secondary | ICD-10-CM | POA: Insufficient documentation

## 2021-08-24 DIAGNOSIS — R8289 Other abnormal findings on cytological and histological examination of urine: Secondary | ICD-10-CM | POA: Diagnosis not present

## 2021-08-24 DIAGNOSIS — Z59819 Housing instability, housed unspecified: Secondary | ICD-10-CM | POA: Insufficient documentation

## 2021-08-24 DIAGNOSIS — I509 Heart failure, unspecified: Secondary | ICD-10-CM | POA: Insufficient documentation

## 2021-08-24 DIAGNOSIS — Z794 Long term (current) use of insulin: Secondary | ICD-10-CM | POA: Insufficient documentation

## 2021-08-24 DIAGNOSIS — D75839 Thrombocytosis, unspecified: Secondary | ICD-10-CM | POA: Insufficient documentation

## 2021-08-24 DIAGNOSIS — Z21 Asymptomatic human immunodeficiency virus [HIV] infection status: Secondary | ICD-10-CM | POA: Diagnosis not present

## 2021-08-24 DIAGNOSIS — R739 Hyperglycemia, unspecified: Secondary | ICD-10-CM | POA: Diagnosis present

## 2021-08-24 DIAGNOSIS — E8809 Other disorders of plasma-protein metabolism, not elsewhere classified: Secondary | ICD-10-CM | POA: Insufficient documentation

## 2021-08-24 HISTORY — DX: Acute kidney failure, unspecified: N17.9

## 2021-08-24 LAB — CBC WITH DIFFERENTIAL/PLATELET
Abs Immature Granulocytes: 0.02 10*3/uL (ref 0.00–0.07)
Basophils Absolute: 0 10*3/uL (ref 0.0–0.1)
Basophils Relative: 1 %
Eosinophils Absolute: 0.1 10*3/uL (ref 0.0–0.5)
Eosinophils Relative: 1 %
HCT: 30.8 % — ABNORMAL LOW (ref 36.0–46.0)
Hemoglobin: 9.3 g/dL — ABNORMAL LOW (ref 12.0–15.0)
Immature Granulocytes: 0 %
Lymphocytes Relative: 53 %
Lymphs Abs: 4.3 10*3/uL — ABNORMAL HIGH (ref 0.7–4.0)
MCH: 21.5 pg — ABNORMAL LOW (ref 26.0–34.0)
MCHC: 30.2 g/dL (ref 30.0–36.0)
MCV: 71.3 fL — ABNORMAL LOW (ref 80.0–100.0)
Monocytes Absolute: 0.5 10*3/uL (ref 0.1–1.0)
Monocytes Relative: 6 %
Neutro Abs: 3.2 10*3/uL (ref 1.7–7.7)
Neutrophils Relative %: 39 %
Platelets: 594 10*3/uL — ABNORMAL HIGH (ref 150–400)
RBC: 4.32 MIL/uL (ref 3.87–5.11)
RDW: 16.1 % — ABNORMAL HIGH (ref 11.5–15.5)
WBC: 8.1 10*3/uL (ref 4.0–10.5)
nRBC: 0 % (ref 0.0–0.2)

## 2021-08-24 LAB — I-STAT VENOUS BLOOD GAS, ED
Acid-Base Excess: 2 mmol/L (ref 0.0–2.0)
Bicarbonate: 26.8 mmol/L (ref 20.0–28.0)
Calcium, Ion: 1.18 mmol/L (ref 1.15–1.40)
HCT: 30 % — ABNORMAL LOW (ref 36.0–46.0)
Hemoglobin: 10.2 g/dL — ABNORMAL LOW (ref 12.0–15.0)
O2 Saturation: 99 %
Potassium: 4.6 mmol/L (ref 3.5–5.1)
Sodium: 134 mmol/L — ABNORMAL LOW (ref 135–145)
TCO2: 28 mmol/L (ref 22–32)
pCO2, Ven: 40 mmHg — ABNORMAL LOW (ref 44–60)
pH, Ven: 7.434 — ABNORMAL HIGH (ref 7.25–7.43)
pO2, Ven: 130 mmHg — ABNORMAL HIGH (ref 32–45)

## 2021-08-24 LAB — COMPREHENSIVE METABOLIC PANEL WITH GFR
ALT: 13 U/L (ref 0–44)
AST: 12 U/L — ABNORMAL LOW (ref 15–41)
Albumin: 3 g/dL — ABNORMAL LOW (ref 3.5–5.0)
Alkaline Phosphatase: 85 U/L (ref 38–126)
Anion gap: 12 (ref 5–15)
BUN: 16 mg/dL (ref 6–20)
CO2: 24 mmol/L (ref 22–32)
Calcium: 9.3 mg/dL (ref 8.9–10.3)
Chloride: 99 mmol/L (ref 98–111)
Creatinine, Ser: 1.09 mg/dL — ABNORMAL HIGH (ref 0.44–1.00)
GFR, Estimated: 60 mL/min — ABNORMAL LOW
Glucose, Bld: 348 mg/dL — ABNORMAL HIGH (ref 70–99)
Potassium: 4.4 mmol/L (ref 3.5–5.1)
Sodium: 135 mmol/L (ref 135–145)
Total Bilirubin: 0.4 mg/dL (ref 0.3–1.2)
Total Protein: 7.3 g/dL (ref 6.5–8.1)

## 2021-08-24 LAB — BMP8+ANION GAP
Anion Gap: 20 mmol/L — ABNORMAL HIGH (ref 10.0–18.0)
BUN/Creatinine Ratio: 15 (ref 9–23)
BUN: 17 mg/dL (ref 6–24)
CO2: 19 mmol/L — ABNORMAL LOW (ref 20–29)
Calcium: 9.2 mg/dL (ref 8.7–10.2)
Chloride: 92 mmol/L — ABNORMAL LOW (ref 96–106)
Creatinine, Ser: 1.16 mg/dL — ABNORMAL HIGH (ref 0.57–1.00)
Glucose: 716 mg/dL (ref 70–99)
Potassium: 5 mmol/L (ref 3.5–5.2)
Sodium: 131 mmol/L — ABNORMAL LOW (ref 134–144)
eGFR: 56 mL/min/{1.73_m2} — ABNORMAL LOW (ref >59)

## 2021-08-24 LAB — URINALYSIS, ROUTINE W REFLEX MICROSCOPIC
Bacteria, UA: NONE SEEN
Bilirubin Urine: NEGATIVE
Glucose, UA: >=500 mg/dL — AB
Hgb urine dipstick: NEGATIVE
Ketones, ur: NEGATIVE mg/dL
Leukocytes,Ua: NEGATIVE
Nitrite: NEGATIVE
Protein, ur: 100 mg/dL — AB
Specific Gravity, Urine: 1.028 (ref 1.005–1.030)
pH: 5 (ref 5.0–8.0)

## 2021-08-24 LAB — CBG MONITORING, ED
Glucose-Capillary: 337 mg/dL — ABNORMAL HIGH (ref 70–99)
Glucose-Capillary: 280 mg/dL — ABNORMAL HIGH (ref 70–99)

## 2021-08-24 LAB — LIPASE, BLOOD: Lipase: 31 U/L (ref 11–51)

## 2021-08-24 MED ORDER — SYNJARDY 5-500 MG PO TABS: 1.0000 | ORAL_TABLET | Freq: Two times a day (BID) | ORAL | 2 refills | Status: DC

## 2021-08-24 NOTE — Discharge Instructions (Addendum)
You were seen in the ED for evaluation of your elevated blood sugar. Your labs still show elevated blood sugar, but not as high as it was yesterday. Your kidney function seems to be improving. I would STOP using the Lantus 10 units three times a day and START using the Lantus 30 units at night. Start taking your [place] again. Make sure you are taking your medications as prescribed. Remember to check your blood sugar in the morning and at night. Please follow up with your PCP as soon as possible to get scheduled with a diabetes coordinator and for medication management. Follow up with your surgeon for your drainage bag as directed to. If you have any concern, new or worsening symptoms, please return to the nearest ER for re-evaluation.   Contact a health care provider if: Your blood glucose is at or above 240 mg/dL (62.8 mmol/L) for 2 days in a row. You have problems keeping your blood glucose in your target range. You have frequent episodes of hyperglycemia. You have signs of illness, such as nausea, vomiting, or fever. Get help right away if: Your blood glucose monitor reads "high" even when you are taking insulin. You have trouble breathing. You have a change in how you think, feel, or act (mental status). You have nausea or vomiting that does not go away. These symptoms may represent a serious problem that is an emergency. Do not wait to see if the symptoms will go away. Get medical help right away. Call your local emergency services (911 in the [place] Do not drive yourself to the hospital.

## 2021-08-24 NOTE — Assessment & Plan Note (Signed)
Patient presented in person for telehealth appointment.  She states that she is currently living with her daughter.  [employer]ousing [employer] paperwork received and will complete this paperwork.

## 2021-08-24 NOTE — Assessment & Plan Note (Signed)
Patient with embolic CVA May 31.  Imaging showed multiple small embolic strokes with possible small clot internal right ICA.  Echo showed newly diagnosed low ejection fraction with strong possibility of cardiogenic embolism.  TEE showed no evidence of clot or vegetation.  With her history of anemia decision was made to hold off on anticoagulation for now and to continue antiplatelet therapy alone.  Neurology recommended single agent therapy with Plavix 75 mg.  She will need to follow-up with [facility] sometime within the next couple of weeks. A/P: Patient is to be on Plavix 75 mg alone for antiplatelet therapy.  Decision made not to start anticoagulation due to severe anemia.  Aspirin was not started due to bleeding risk. -crestor decreased to 10 due to renal function -Message sent to Dr. Pearlean Brownie for follow-up after hospitalization

## 2021-08-24 NOTE — Assessment & Plan Note (Signed)
TEE showed EF of 30 to 35% with global hypokinesis of left ventricle.  Cardiology consulted due to new found reduced ejection fraction.  Unsure etiology, but suspected that insufficiently treated hypertension led to reduced EF.  Coronary CTA was canceled while inpatient due to a bump in serum creatinine. A/P: BMP showed improvement in creatinine.  Creatinine improved from 3.43-1.16. Referral to cardiology, she requires further evaluation for possible ischemic etiology. -start metformin- empagliflozin

## 2021-08-24 NOTE — Assessment & Plan Note (Signed)
>>  ASSESSMENT AND PLAN FOR HOUSING INSECURITY WRITTEN ON 08/24/2021  7:01 AM BY MASTERS, KATIE, DO  Patient presented in person for telehealth appointment.  She states that she is currently living with her daughter.  [employer]ousing [employer] paperwork received and will complete this paperwork.

## 2021-08-24 NOTE — Assessment & Plan Note (Addendum)
Patient admitted to hospital due to embolic CVA.  Hemoglobin A1c found to be at 14.2.  She had been taking Lantus 30 units nightly prior to admission.  Discharge orders show continued nation of Lantus, however instructions state NovoLog and Semglee.  Patient is unsure of what kind of insulin she has at home.  She does not have a glucometer and has been injecting insulin without checking blood sugars.  She has been injecting 10 units of unknown type of insulin before meals since getting out of the hospital.  She does have a known history of DKA.  She denies blurry vision and polyuria at this time. A/P: BMP showed glucose greater than 700 with anion gap of 20.  I called and talked with patient's daughter, who works at [employer].  She is planning to leave work now to bring her mother to the emergency room.  She states that her mom is not adherent with diet or injecting insulin despite her encouragement to do so.  She works during the day and is not able to assist with insulin dosing. -Reached patient's daughter and asked to bring to the emergency room after results of BMP -start metformin-empagliflozin

## 2021-08-24 NOTE — ED Provider Triage Note (Signed)
Emergency Medicine Provider Triage Evaluation Note  Ashley Vaughn , a 56 y.o. female  was evaluated in triage.  Pt complains of elevated CBG. Pt report she ran out of several of her diabetic medications x 2 days. Checked her CBG yesterday and it was 700. She voice concerns.  Denies fever, abd pain, confusion, dysuria  Review of Systems  Positive: As above Negative: As above  Physical Exam  BP 120/76   Pulse 88   Temp 97.7 F (36.5 C) (Oral)   Resp 16   Ht 5\' 2"  (1.575 m)   Wt 58.1 kg   SpO2 97%   BMI 23.41 kg/m  Gen:   Awake, no distress   Resp:  Normal effort  MSK:   Moves extremities without difficulty  Other:    Medical Decision Making  Medically screening exam initiated at 11:51 AM.  Appropriate orders placed.  Ashley Vaughn was informed that the remainder of the evaluation will be completed by another provider, this initial triage assessment does not replace that evaluation, and the importance of remaining in the ED until their evaluation is complete.     Rhona Raider, PA-C 08/24/21 1152

## 2021-08-24 NOTE — ED Provider Notes (Signed)
[facility] EMERGENCY DEPARTMENT Provider Note   CSN: [id number] Arrival date & time: 08/24/21  1023     History Chief Complaint  Patient presents with   Hyperglycemia    Ashley Vaughn is a 56 y.o. female with history of CHF, CVA, HIV, poorly managed diabetes, noncompliant see the medicine presents the emergency department for evaluation of hyperglycemia.  Patient was seen by her primary care office yesterday and received basic lab work.  Yesterday they noticed that her blood sugar was in the 700s and that she had an anion gap of 20 and advised her to come to the emergency department for reevaluation.  Currently the patient denies any symptoms.  She does not have any polyuria, polydipsia, abdominal pain, nausea, vomiting, blurry vision, chest pain, or shortness of breath.  The patient reports that she feels fine.  The patient admits that she is not the best about taking her insulin, however she did use 10 units last night and 10 units this morning.  She did not use 10 units in the afternoon given that she has been in the emergency department.  She reports that when she checked her blood sugar this morning her blood sugar was around 385.  The patient reports that she thinks the insulin she uses Lantus, however is not sure.  She reports that she has not had her HIV is never checked in some time and during her last admission they told her to stop taking her HIV medications due to her kidney function, however she is still taking them.  Also, the patient still has a drain in her right lower quadrant abdomen from her abscess that formed after her appendectomy.  Patient reports that she was to have an appointment yesterday although did not go.  She reports she has a appointment following up.  Allergic to fluorescein.  No current tobacco, EtOH, illicit drug use.   Hyperglycemia Associated symptoms: no abdominal pain, no chest pain, no fever, no increased thirst, no nausea, no polyuria, no  shortness of breath and no vomiting        Home Medications Prior to Admission medications   Medication Sig Start Date End Date Taking? Authorizing Provider  Accu-Chek Softclix Lancets lancets Check blood sugar 3 times per day 08/23/21   Masters, Joellen Jersey, DO  aspirin EC 81 MG tablet Take 1 tablet (81 mg total) by mouth daily. Swallow whole. 07/28/21   Farrel Gordon, DO  B-D UF III MINI PEN NEEDLES 31G X 5 MM MISC Use to inject insulin one time daily 07/18/21   Demaio, Alexa, MD  Blood Glucose Monitoring Suppl (ACCU-CHEK GUIDE ME) w/Device KIT 1 kit by Does not apply route daily. Patient not taking: Reported on 03/16/2021 09/30/19   Kuppelweiser, Cassie L, RPH-CPP  Blood Glucose Monitoring Suppl (ACCU-CHEK NANO SMARTVIEW) w/Device KIT 1 each by Does not apply route 2 (two) times daily. Check blood sugar 4x a day before meals and bedtime  2 days each week. Dx code- 250.00. 07/18/21   Demaio, Roxine Caddy, MD  carvedilol (COREG) 6.25 MG tablet Take 1 tablet (6.25 mg total) by mouth 2 (two) times daily. 07/27/21 07/27/22  Farrel Gordon, DO  Cholecalciferol (VITAMIN D3) 5000 units TABS Take 5,000 Units by mouth daily.    [provider]  clopidogrel (PLAVIX) 75 MG tablet Take 1 tablet (75 mg total) by mouth daily. 07/28/21   Farrel Gordon, DO  DULoxetine (CYMBALTA) 60 MG capsule Take 1 capsule (60 mg total) by mouth daily. 05/23/21  09/22/21  Demaio, Alexa, MD  Empagliflozin-metFORMIN HCl (SYNJARDY) 5-500 MG TABS Take 1 tablet by mouth 2 (two) times daily. 08/24/21   Masters, Katie, DO  glucose blood (ACCU-CHEK GUIDE) test strip Check blood sugar 3 times per day 07/18/21   Demaio, Alexa, MD  insulin glargine (LANTUS SOLOSTAR) 100 UNIT/ML Solostar Pen Inject 30 Units into the skin daily. 07/27/21 02/12/22  Farrel Gordon, DO  Lancets Misc. (ACCU-CHEK SOFTCLIX LANCET DEV) KIT Use to check blood sugar up to 3 times a day before meals. 08/23/21   Masters, Katie, DO  rosuvastatin (CRESTOR) 10 MG tablet Take 1 tablet (10 mg total) by  mouth daily. 07/28/21   Farrel Gordon, DO      Allergies    Fluorescein    Review of Systems   Review of Systems  Constitutional:  Negative for chills and fever.  Eyes:  Negative for visual disturbance.  Respiratory:  Negative for shortness of breath.   Cardiovascular:  Negative for chest pain.  Gastrointestinal:  Negative for abdominal pain, nausea and vomiting.  Endocrine: Negative for polydipsia, polyphagia and polyuria.    Physical Exam Updated Vital Signs BP 124/77 (BP Location: Left Arm)   Pulse 88   Temp 97.6 F (36.4 C) (Oral)   Resp 16   Ht $R'5\' 2"'[place]$  (1.575 m)   Wt 58.1 kg   SpO2 100%   BMI 23.41 kg/m  Physical Exam Vitals and nursing note reviewed.  Constitutional:      General: She is not in acute distress.    Appearance: Normal appearance. She is not ill-appearing or toxic-appearing.  HENT:     Head: Normocephalic and atraumatic.     Mouth/Throat:     Mouth: Mucous membranes are moist.  Eyes:     General: No scleral icterus. Cardiovascular:     Rate and Rhythm: Normal rate and regular rhythm.  Pulmonary:     Effort: Pulmonary effort is normal. No respiratory distress.     Breath sounds: Normal breath sounds.  Abdominal:     General: Bowel sounds are normal.     Palpations: Abdomen is soft.     Tenderness: There is no abdominal tenderness. There is no guarding or rebound.     Comments: Drain noted in the right lower quadrant abdomen with suture in place.  No surrounding erythema, induration, or fluctuance.  No increased warmth or color changes surrounding.  There is a very small amount of crusting discharge noted around the tubing.  Small amount of purulent drainage in the tubing and bag.  Abdomen is nontender to palpation.  No guarding or rebound.  Normal active bowel sounds.  Musculoskeletal:        General: No deformity.     Cervical back: Normal range of motion.  Skin:    General: Skin is warm and dry.  Neurological:     General: No focal deficit  present.     Mental Status: She is alert. Mental status is at baseline.     ED Results / Procedures / Treatments   Labs (all labs ordered are listed, but only abnormal results are displayed) Labs Reviewed  CBC WITH DIFFERENTIAL/PLATELET - Abnormal; Notable for the following components:      Result Value   Hemoglobin 9.3 (*)    HCT 30.8 (*)    MCV 71.3 (*)    MCH 21.5 (*)    RDW 16.1 (*)    Platelets 594 (*)    Lymphs Abs 4.3 (*)    All  other components within normal limits  COMPREHENSIVE METABOLIC PANEL - Abnormal; Notable for the following components:   Glucose, Bld 348 (*)    Creatinine, Ser 1.09 (*)    Albumin 3.0 (*)    AST 12 (*)    GFR, Estimated 60 (*)    All other components within normal limits  URINALYSIS, ROUTINE W REFLEX MICROSCOPIC - Abnormal; Notable for the following components:   APPearance HAZY (*)    Glucose, UA >=500 (*)    Protein, ur 100 (*)    All other components within normal limits  CBG MONITORING, ED - Abnormal; Notable for the following components:   Glucose-Capillary 337 (*)    All other components within normal limits  I-STAT VENOUS BLOOD GAS, ED - Abnormal; Notable for the following components:   pH, Ven 7.434 (*)    pCO2, Ven 40.0 (*)    pO2, Ven 130 (*)    Sodium 134 (*)    HCT 30.0 (*)    Hemoglobin 10.2 (*)    All other components within normal limits  LIPASE, BLOOD    EKG None  Radiology No results found.  Procedures Procedures   Medications Ordered in ED Medications - No data to display  ED Course/ Medical Decision Making/ A&P                           Medical Decision Making  56 year old female presents the emergency department for evaluation of elevated blood sugar reading on lab work at PCP yesterday.  Differential diagnosis includes was limited to hyperglycemia, uncontrolled diabetes, DKA, dehydration.  Vital signs are unremarkable.  Physical exam as detailed above.  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  During this admission they stopped her HIV medications given her elevated creatinine.  The patient is continue taking these.  During her last admission, her hemoglobin A1c was found to be 14.2.  Prior to yesterday, the patient had been giving herself insulin without checking her blood sugars on her glucometer.  She has been giving herself 10 units of an unknown type of insulin before meals since getting out of the hospital.  She does have a known history of DKA.  With the lab work they ordered yesterday, showed that her glucose was greater than 700 with an anion gap of 20.  The patient's daughter was called and asked to bring to the emergency department.  I independently reviewed and interpreted the patient's labs.  Lipase is normal.  CMP shows elevated glucose at 348.  Creatinine at 1.09 which has improved from the patient's creatinine yesterday of 1.16.  Her albumin is low at 3.0.  Mild decrease in AST at 12.  GFR is 60 and her anion gap is 12.  I-STAT VBG shows pH is 7.4.  Normal bicarb.  CBC shows anemia 9.3 which appears to be slightly increased in patient's baseline.  Thrombocytosis still present and elevated from previous at 594.  CBG at 1107 was 337.  New CBG shows 280.  Urinalysis shows hazy urine with greater than 500 glucose with 100 proteins.  No ketones seen.  No signs of infection.  After discussion with patient and daughter of patient at bedside. There seems to be confusion on what medications she is supposed to be taking and which ones she is not supposed to be  taking. Her Cr levels have improved at 1.09. I do not see any signs of DKA as the patient does not have any ketones or an anion gap. Her VBG shows a pH of 7.4. This patient likely had a high blood sugar reading 2/2 medication non compliance.   Discussed with the patient that upon discharge, her medical team may  have been talking about short term insulin in comparison to long term insulin when talking about taking 10 units before every meal. Confirmed with Roderic Palau, [facility] that the patient is only filling Lantus. I advised the patient to stick to the 30 units nightly and to return to taking her Synjardy as mentioned on her last IM note. I advised her to follow up with her PCP for full detailed list of her medications she is supposed to be taking. I discussed with them to keep checking her BS daily and to follow up with the diabetes coordinator that was offered to her early this year for follow up. We discussed strict return precautions and red flag symptoms. They verbalize understanding and agree to the plan. The patient is stable and being discharged home in good condition.   I discussed this case with my attending physician who cosigned this note including patient's presenting symptoms, physical exam, and planned diagnostics and interventions. Attending physician stated agreement with plan or made changes to plan which were implemented.   Final Clinical Impression(s) / ED Diagnoses Final diagnoses:  Type 2 diabetes mellitus with hyperglycemia, with long-term current use of insulin [facility])    Rx / DC Orders ED Discharge Orders     None         Sherrell Puller, PA-C 08/24/21 1702    Isla Pence, MD 08/24/21 [phone]

## 2021-08-24 NOTE — ED Triage Notes (Signed)
Pt arrived POV from home c/o hyperglycemia she states when she took it this morning and it was 700. Pt states she was able to get it down to 300 before coming. PT denies any pain or other complaints.

## 2021-08-24 NOTE — Assessment & Plan Note (Addendum)
Patient had appendectomy complicated by retrocecal abscess status post drain placement.  Repeat imaging on June 22 showed interval reduction/near resolution of intraperitoneal component of the pericecal abscess. Patient is unsure of follow-up plans with drain. On exam skin surrounding drain is not erythematous, drain has no output in bulb. A/P: -she needs to follow-up in radiology clinic. 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 -presenting to ED due to Elevated glucose with AGMA, will need close follow-up after this

## 2021-08-24 NOTE — Assessment & Plan Note (Signed)
Patient with history of HIV.  She follows with [facility] with Dr. Ninetta Lights. Emtricitabine/ Rilpivirine/ Tenofovir was held during admission due to AKI.  Patient states that even though discharge summary stated to not take medication that she has been taking medication about every other day.  She was scheduled for follow-up with our [facility] today but was unaware of appointment. A/P: Follow-up with Dr. Ninetta Lights in internal medicine clinic on July 18. Renal function is improved on BMP

## 2021-08-24 NOTE — Assessment & Plan Note (Signed)
AKI thought to be from drug-induced versus cardiorenal syndrome.  She was not able to complete ischemic evaluation for newly found HFrEF due to worsening AKI and GDMT was not initiated.  Creatinine improved from 3.43-1.16. A/P: BMP showed improvement in renal function but showed signs of DKA with AGMA. Will start metformin-empagliflozin, but will hold off on additional medication changes at this time until follow-up.

## 2021-08-24 NOTE — Assessment & Plan Note (Signed)
Large stool burden seen onCT abdomen. A/P: Not able to focus on this problem, follow-up at next visit when able.

## 2021-08-27 ENCOUNTER — Ambulatory Visit: Payer: Medicaid Other | Admitting: Licensed Clinical Social Worker

## 2021-08-27 NOTE — Progress Notes (Signed)
Internal Medicine Clinic Attending  Case discussed with Dr. Masters  At the time of the visit.  We reviewed the resident's history and exam and pertinent patient test results.  I agree with the assessment, diagnosis, and plan of care documented in the resident's note.  

## 2021-08-27 NOTE — Patient Instructions (Signed)
Visit Information  Instructions:   Patient was given the following information about care management and care coordination services today, agreed to services, and gave verbal consent: 1.care management/care coordination services include personalized support from designated clinical staff supervised by their physician, including individualized plan of care and coordination with other care providers 2. 24/7 contact phone numbers for assistance for urgent and routine care needs. 3. The patient may stop care management/care coordination services at any time by phone call to the office staff.  Patient verbalizes understanding of instructions and care plan provided today and agrees to view in MyChart. Active MyChart status and patient understanding of how to access instructions and care plan via MyChart confirmed with patient.     No additional follow up needed at this time.  Kentley Cedillo, BSW, MSW  Social Worker [facility] Care Management  [phone]      

## 2021-08-27 NOTE — Chronic Care Management (AMB) (Signed)
  Care Management   Social Work Visit Note  08/27/2021 Name: Ashley Vaughn MRN: [id number] DOB: March 26, 1965  Ashley Vaughn is a 56 y.o. year old female who sees Marrianne Mood, MD for primary care. The care management team was consulted for assistance with care management and care coordination needs related to [facility] Resources    Patient was given the following information about care management and care coordination services today, agreed to services, and gave verbal consent: 1.care management/care coordination services include personalized support from designated clinical staff supervised by their physician, including individualized plan of care and coordination with other care providers 2. 24/7 contact phone numbers for assistance for urgent and routine care needs. 3. The patient may stop care management/care coordination services at any time by phone call to the office staff.  Engaged with patient by telephone for follow up visit in response to provider referral for social work chronic care management and care coordination services.  Assessment: Review of patient history, allergies, and health status during evaluation of patient need for care management/care coordination services.    Interventions:  Patient interviewed and appropriate assessments performed Collaborated with clinical team regarding patient needs Patient is in need of two additional mattresses. SW unable to secure the additional two at this time. SW will continue researching agencies to assist. Patient already has two mattresses, but states she needs 4-5 total. S     Plan:  No additional follow up needed at this time. SW will continue researching for additional mattresses.  Ander Gaster, MSW Social Worker [facility] Care Management  [phone]

## 2021-08-28 ENCOUNTER — Inpatient Hospital Stay: Payer: Medicaid Other | Admitting: Neurology

## 2021-08-28 ENCOUNTER — Ambulatory Visit (INDEPENDENT_AMBULATORY_CARE_PROVIDER_SITE_OTHER): Payer: Medicaid Other | Admitting: Infectious Diseases

## 2021-08-28 ENCOUNTER — Encounter: Payer: Self-pay | Admitting: Neurology

## 2021-08-28 ENCOUNTER — Encounter: Payer: Self-pay | Admitting: Infectious Diseases

## 2021-08-28 ENCOUNTER — Other Ambulatory Visit: Payer: Self-pay

## 2021-08-28 VITALS — BP 134/82 | HR 87 | Temp 98.0°F | Wt 129.6 lb

## 2021-08-28 DIAGNOSIS — R634 Abnormal weight loss: Secondary | ICD-10-CM | POA: Diagnosis not present

## 2021-08-28 DIAGNOSIS — T8143XA Infection following a procedure, organ and space surgical site, initial encounter: Secondary | ICD-10-CM | POA: Diagnosis not present

## 2021-08-28 DIAGNOSIS — B20 Human immunodeficiency virus [HIV] disease: Secondary | ICD-10-CM

## 2021-08-28 NOTE — Patient Instructions (Addendum)
I think most of the things going on with your health are more related to the diabetes than anything.  Please stop by the lab so we can update things and see if Ashley Vaughn is an option for you.   Internal Medicine Clinic - ground floor of [facility]. [phone] to schedule a visit with Dr. Ninetta Lights in 3 months

## 2021-08-28 NOTE — Progress Notes (Signed)
Name: Ashley Vaughn  DOB: 22-Feb-1965 MRN: [id number] PCP: Nani Gasser, MD     Subjective:   Chief Complaint  Patient presents with   Follow-up    B20 - patient concerned about weight loss around 10 lbs since April.     HPI: Ashley Vaughn is here with her 55 yo grandson for follow up.   She is 129 lbs today and she feels she has lost about 20 lbs without intention since around April when she has had issues with appendicitis. She still has a RLQ drain in place that causes her discomfort. No fevers/chills. She has an appt in the general surgery clinic on 7/19 to eval this for removal with a CT scan to be done prior.   She has been taking her HIV medication intermittently. Feels that she has just not recovered since her appendectomy. She says she has started back the [facility] everyday for a few days now but has not done very well overall. She is trying to take her diabetes medications and eat in accordance to diabetic diet but she does not have any control over her food currently as she is living with her daughter - they do not get along well from what she tells me. She is on food stamps at the moment.  She has gotten her sugars down to < 300 lately but food is not under her immediate control at this time.   Has had strokes since LOV - memory deficits. Loses track of days. No physical symptoms from what she can assess.       08/28/2021   10:20 AM  Depression screen PHQ 2/9  Decreased Interest 3  Down, Depressed, Hopeless 3  PHQ - 2 Score 6    Review of Systems  Constitutional:  Positive for malaise/fatigue and weight loss. Negative for chills and fever.  HENT:  Negative for sore throat.        No dental problems  Respiratory:  Negative for cough and sputum production.   Cardiovascular:  Negative for chest pain and leg swelling.  Gastrointestinal:  Negative for abdominal pain, diarrhea and vomiting.  Genitourinary:  Negative for dysuria and flank pain.  Musculoskeletal:  Negative  for joint pain, myalgias and neck pain.  Skin:  Negative for rash.  Neurological:  Negative for dizziness, tingling and headaches.  Endo/Heme/Allergies:  Positive for polydipsia.  Psychiatric/Behavioral:  Positive for depression. Negative for substance abuse. The patient is nervous/anxious and has insomnia.     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   Seizure ([place]) 07/18/2021    Outpatient Medications Prior to Visit  Medication Sig Dispense Refill   Accu-Chek Softclix Lancets lancets Check blood sugar 3 times per day 270 each 3   aspirin EC 81 MG tablet Take 1 tablet (81 mg total) by mouth daily. Swallow whole. 30 tablet 12   B-D UF III MINI PEN NEEDLES 31G X 5 MM  MISC Use to inject insulin one time daily 100 each 3   Blood Glucose Monitoring Suppl (ACCU-CHEK GUIDE ME) w/Device KIT 1 kit by Does not apply route daily. 1 kit 0   Blood Glucose Monitoring Suppl (ACCU-CHEK NANO SMARTVIEW) w/Device KIT 1 each by Does not apply route 2 (two) times daily. Check blood sugar 4x a day before meals and bedtime  2 days each week. Dx code- 250.00. 1 kit 0   carvedilol (COREG) 6.25 MG tablet Take 1 tablet (6.25 mg total) by mouth 2 (two) times daily. 60 tablet 11   Cholecalciferol (VITAMIN D3) 5000 units TABS Take 5,000 Units by mouth daily.     clopidogrel (PLAVIX) 75 MG tablet Take 1 tablet (75 mg total) by mouth daily. 21 tablet 0   DULoxetine (CYMBALTA) 60 MG capsule  Take 1 capsule (60 mg total) by mouth daily. 30 capsule 0   Empagliflozin-metFORMIN HCl (SYNJARDY) 5-500 MG TABS Take 1 tablet by mouth 2 (two) times daily. 60 tablet 2   glucose blood (ACCU-CHEK GUIDE) test strip Check blood sugar 3 times per day 270 each 3   insulin glargine (LANTUS SOLOSTAR) 100 UNIT/ML Solostar Pen Inject 30 Units into the skin daily. 15 mL 3   Lancets Misc. (ACCU-CHEK SOFTCLIX LANCET DEV) KIT Use to check blood sugar up to 3 times a day before meals. 1 kit 1   rosuvastatin (CRESTOR) 10 MG tablet Take 1 tablet (10 mg total) by mouth daily. 30 tablet 2   No facility-administered medications prior to visit.     Allergies  Allergen Reactions   Fluorescein Itching and Rash    INTRAVENOUSLY    Social History   Tobacco Use   Smoking status: Never    Passive exposure: Current   Smokeless tobacco: Never  Vaping Use   Vaping Use: Never used  Substance Use Topics   Alcohol use: Yes    Alcohol/week: 0.0 standard drinks of alcohol    Comment: occasional   Drug use: No    Comment: h/o cocaine abuse, clean since 2009    Family History  Problem Relation Age of Onset   Diabetes Mother    Hypertension Mother    Heart disease Father    Arthritis Father    Diabetes Maternal Uncle     Social History   Substance and Sexual Activity  Sexual Activity Never   Partners: Male   Comment: pt. declined condoms     Objective:   Vitals:   08/28/21 1016  BP: 134/82  Pulse: 87  Temp: 98 F (36.7 C)  TempSrc: Oral  SpO2: 100%  Weight: 129 lb 9.6 oz (58.8 kg)   Body mass index is 23.7 kg/m.  Physical Exam Vitals reviewed.  Constitutional:      Appearance: Normal appearance. She is not ill-appearing.  Cardiovascular:     Rate and Rhythm: Normal rate.  Pulmonary:     Effort: Pulmonary effort is normal.     Breath sounds: Normal breath sounds.  Abdominal:     General: Bowel sounds are normal.     Palpations: Abdomen is soft.     Comments: Abdominal drain in  place - redressed today.  Neurological:     Mental Status: She is alert.     Lab Results Lab Results  Component Value Date   WBC 8.1 08/24/2021   HGB 10.2 (L) 08/24/2021   HCT 30.0 (L) 08/24/2021   MCV 71.3 (L) 08/24/2021   PLT 594 (H) 08/24/2021    Lab  Results  Component Value Date   CREATININE 1.09 (H) 08/24/2021   BUN 16 08/24/2021   NA 134 (L) 08/24/2021   K 4.6 08/24/2021   CL 99 08/24/2021   CO2 24 08/24/2021    Lab Results  Component Value Date   ALT 13 08/24/2021   AST 12 (L) 08/24/2021   ALKPHOS 85 08/24/2021   BILITOT 0.4 08/24/2021    Lab Results  Component Value Date   CHOL 148 07/19/2021   HDL 34 (L) 07/19/2021   LDLCALC 90 07/19/2021   TRIG 118 07/19/2021   CHOLHDL 4.4 07/19/2021   HIV 1 RNA Quant  Date Value  08/28/2021 NOT DETECTED copies/mL  05/04/2021 Not Detected Copies/mL  03/16/2021 1,740 copies/mL (H)   CD4 T Cell Abs (/uL)  Date Value  08/28/2021 1,076  09/06/2019 1,491  03/12/2019 1,208     Assessment & Plan:   Problem List Items Addressed This Visit       Unprioritized   Human immunodeficiency virus (HIV) disease ([place]) - Primary    She reports intermittent adherence but back on track for a week or so now. Will update pertinent labs and have her follow up with Dr. Johnnye Sima in 3-4 months to check in with him. No changes to ART at this time - pending geno.       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 Following with IM clinic and surgery team.       Weight loss    Suspect related to uncontrolled diabetes and slow to recover from surgery procedure.  Despite intermittent reports of HIV medications she had an intact CD4 count at last check > 1000 and I doubt this is  the cause.  Recommended to follow up closely with internal medicine team.       Total Encounter Time 40 min including time spent in chart reviewing hospital records and outpatient notes since last OV, face to face discussion.    Janene Madeira, MSN, NP-C [facility] [place] Pager: [phone] Office: [phone]  09/04/21  7:35 AM

## 2021-08-29 ENCOUNTER — Ambulatory Visit
Admission: RE | Admit: 2021-08-29 | Discharge: 2021-08-29 | Disposition: A | Discharge status: Home / Self Care | Payer: Medicaid Other | Source: Ambulatory Visit | Attending: Pulmonary Disease | Admitting: Pulmonary Disease

## 2021-08-29 DIAGNOSIS — K3533 Acute appendicitis with perforation and localized peritonitis, with abscess: Secondary | ICD-10-CM

## 2021-08-29 HISTORY — PX: IR RADIOLOGIST EVAL & MGMT: IMG5224

## 2021-08-29 LAB — T-HELPER CELLS (CD4) COUNT (NOT AT ARMC)
CD4 % Helper T Cell: 39 % (ref 33–65)
CD4 T Cell Abs: 1076 /uL (ref 400–1790)

## 2021-08-29 NOTE — Progress Notes (Signed)
Referring Physician(s): Demaio,Alexa  Chief Complaint: The patient is seen in follow up today s/p postoperative fluid collection requiring percutaneous drainage catheter placement by Dr. Serafina Royals on 07/04/21.  History of present illness:  Ashley Vaughn is a 56 y.o. female with past medical history significant for HIV and poorly controlled diabetes who underwent laparoscopic appendectomy for gangrenous appendicitis on 06/19/2021 ([facility] at [facility]) which was complicated by development of a postoperative fluid collection requiring percutaneous drainage catheter placement which was performed by Dr. Serafina Royals on 07/04/2021.   Patient was initially seen in the interventional radiology drain clinic on 07/18/2021 and subsequent underwent drainage catheter exchange and upsizing at 07/19/2021.  She returned to the interventional radiology drain clinic for drainage catheter evaluation and management on 08/09/21 where she was experiencing 30-50cc of OP from drainage catheter per day.  She returns to the interventional radiology drain clinic for drainage catheter evaluation and management today.    She is experiencing approximately 50cc OP  from drainage catheter every other day which she describes as malodorous. She has been flushing the catheter every other day.  She has completed her prescribed course of antibiotics.  She denies worsening abdominal pain or fevers.    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   Seizure ([place]) 07/18/2021    Past Surgical History:  Procedure Laterality Date   ABDOMINAL HYSTERECTOMY     BUBBLE STUDY  07/23/2021   Procedure: BUBBLE STUDY;  Surgeon: Buford Dresser, MD;  Location: [place];  Service: Cardiovascular;;   IR CATHETER TUBE CHANGE  07/19/2021   IR RADIOLOGIST EVAL & MGMT  08/09/2021   IR RADIOLOGIST EVAL & MGMT  08/29/2021   TEE WITHOUT CARDIOVERSION N/A 07/23/2021   Procedure: TRANSESOPHAGEAL ECHOCARDIOGRAM (TEE);  Surgeon: Buford Dresser, MD;  Location: [facility] ENDOSCOPY;  Service: Cardiovascular;  Laterality: N/A;    Allergies: Fluorescein  Medications: Prior to Admission medications   Medication Sig Start Date End Date Taking? Authorizing Provider  Accu-Chek Softclix Lancets lancets Check blood sugar 3 times per day 08/23/21   Masters, Joellen Jersey, DO  aspirin EC 81 MG tablet Take 1 tablet (81 mg total) by mouth daily. Swallow whole. 07/28/21   Farrel Gordon, DO  B-D UF III MINI PEN NEEDLES 31G X 5 MM MISC Use to inject insulin one time daily 07/18/21   Demaio, Alexa, MD  Blood Glucose Monitoring Suppl (ACCU-CHEK GUIDE ME) w/Device KIT 1 kit by Does not apply route daily. 09/30/19   Kuppelweiser, Cassie L, RPH-CPP  Blood Glucose Monitoring Suppl (ACCU-CHEK NANO SMARTVIEW) w/Device KIT 1 each by Does not apply route 2 (two) times daily. Check blood sugar 4x a day before meals and bedtime  2 days each week. Dx code- 250.00. 07/18/21   Demaio, Roxine Caddy, MD  carvedilol (COREG) 6.25 MG tablet Take 1 tablet (6.25 mg total) by mouth 2 (two) times daily. 07/27/21 07/27/22  Farrel Gordon, DO  Cholecalciferol (VITAMIN D3) 5000  units TABS Take 5,000 Units by mouth daily.    [provider]  clopidogrel (PLAVIX) 75 MG tablet Take 1 tablet (75 mg total) by mouth daily. 07/28/21   Farrel Gordon, DO  DULoxetine (CYMBALTA) 60 MG capsule Take 1 capsule (60 mg total) by mouth daily. 05/23/21 09/22/21  Demaio, Roxine Caddy, MD  Empagliflozin-metFORMIN HCl (SYNJARDY)  5-500 MG TABS Take 1 tablet by mouth 2 (two) times daily. 08/24/21   Masters, Katie, DO  glucose blood (ACCU-CHEK GUIDE) test strip Check blood sugar 3 times per day 07/18/21   Demaio, Alexa, MD  insulin glargine (LANTUS SOLOSTAR) 100 UNIT/ML Solostar Pen Inject 30 Units into the skin daily. 07/27/21 02/12/22  Farrel Gordon, DO  Lancets Misc. (ACCU-CHEK SOFTCLIX LANCET DEV) KIT Use to check blood sugar up to 3 times a day before meals. 08/23/21   Masters, Katie, DO  rosuvastatin (CRESTOR) 10 MG tablet Take 1 tablet (10 mg total) by mouth daily. 07/28/21   Farrel Gordon, DO     Family History  Problem Relation Age of Onset   Diabetes Mother    Hypertension Mother    Heart disease Father    Arthritis Father    Diabetes Maternal Uncle     Social History   Socioeconomic History   Marital status: Legally Separated    Spouse name: Not on file   Number of children: Not on file   Years of education: Not on file   Highest education level: Not on file  Occupational History   Not on file  Tobacco Use   Smoking status: Never    Passive exposure: Current   Smokeless tobacco: Never  Vaping Use   Vaping Use: Never used  Substance and Sexual Activity   Alcohol use: Yes    Alcohol/week: 0.0 standard drinks of alcohol    Comment: occasional   Drug use: No    Comment: h/o cocaine abuse, clean since 2009   Sexual activity: Never    Partners: Male    Comment: pt. declined condoms  Other Topics Concern   Not on file  Social History Narrative   Not on file   Social Determinants of Health   Financial Resource Strain: High Risk (05/23/2021)   Overall Financial Resource Strain (CARDIA)    Difficulty of Paying Living Expenses: Very hard  Food Insecurity: Food Insecurity Present (05/23/2021)   Hunger Vital Sign    Worried About Running Out of Food in the Last Year: Often true    Ran Out of Food in the Last Year: Often true  Transportation Needs: No Transportation Needs (08/16/2021)   PRAPARE -  Hydrologist (Medical): No    Lack of Transportation (Non-Medical): No  Physical Activity: Not on file  Stress: Not on file  Social Connections: Not on file     Vital Signs: BP 102/75   Pulse 93   Temp 97.8 F (36.6 C)   SpO2 100%   Physical Exam Constitutional:      Comments: Chronically ill-appearing  HENT:     Head: Normocephalic and atraumatic.  Eyes:     Extraocular Movements: Extraocular movements intact.  Cardiovascular:     Rate and Rhythm: Normal rate.  Pulmonary:     Effort: Pulmonary effort is normal. No respiratory distress.  Abdominal:     General: Abdomen is flat.     Palpations: Abdomen is soft.     Tenderness: There is abdominal tenderness.  Skin:    General: Skin is warm  and dry.  Neurological:     General: No focal deficit present.     Mental Status: She is alert and oriented to person, place, and time.  Psychiatric:        Mood and Affect: Mood normal.        Behavior: Behavior normal.     Imaging: IR Radiologist Eval & Mgmt  Result Date: 08/29/2021 Please refer to notes tab for details about interventional procedure. (Op Note)   Labs:  CBC: Recent Labs    07/25/21 0419 07/26/21 0116 07/27/21 0306 08/24/21 1200 08/24/21 1242  WBC 5.8 6.0 5.6 8.1  --   HGB 9.0* 8.5* 9.0* 9.3* 10.2*  HCT 29.3* 27.9* 29.6* 30.8* 30.0*  PLT 464* 448* 488* 594*  --     COAGS: Recent Labs    07/20/21 0347  INR 1.1    BMP: Recent Labs    07/25/21 1552 07/26/21 0116 07/27/21 0306 08/23/21 1412 08/24/21 1200 08/24/21 1242  NA 137 136 137 131* 135 134*  K 4.9 4.3 4.3 5.0 4.4 4.6  CL 108 107 109 92* 99  --   CO2 20* 21* 23 19* 24  --   GLUCOSE 180* 139* 109* 716* 348*  --   BUN 18 19 22* 17 16  --   CALCIUM 9.2 8.9 8.7* 9.2 9.3  --   CREATININE 3.50* 3.39* 3.43* 1.16* 1.09*  --   GFRNONAA 15* 15* 15*  --  60*  --     LIVER FUNCTION TESTS: Recent Labs    07/23/21 0643 07/24/21 0323 07/25/21 0419  08/24/21 1200  BILITOT 0.5 0.2* 0.4 0.4  AST $Re'20 20 22 'aBN$ 12*  ALT $Re'22 19 19 13  'xsA$ ALKPHOS 123 132* 112 85  PROT 6.9 6.9 6.8 7.3  ALBUMIN 2.6* 2.7* 2.7* 3.0*    Assessment:  Appendiceal abscess -S/p drainage catheter placement on 07/04/21 -RLQ tender at drain insertion site -Drain injection under fluoro demonstrates fistulous communication to colon -Patient reports f/u with surgery on 7/19 due to fistula and has been encouraged to keep appointment -Discontinue flushing catheter, but may continue to flush out the drainage bag as needed. -will arrange for 2 month clinic f/u with CT and fluoro injection. Should patient have surgery in the interim, drain follow up in IR clinic not necessary.   SignedPasty Spillers, PA 08/29/2021, 2:05 PM   Please refer to Dr. Malachi Carl attestation of this note for management and plan.

## 2021-08-31 ENCOUNTER — Telehealth (HOSPITAL_COMMUNITY): Payer: Self-pay | Admitting: Physician Assistant

## 2021-08-31 LAB — HEMOGLOBIN A1C: Hgb A1c MFr Bld: >14 % of total Hgb — ABNORMAL HIGH (ref <5.7)

## 2021-08-31 LAB — HIV RNA, RTPCR W/R GT (RTI, PI,INT)
HIV 1 RNA Quant: NOT DETECTED copies/mL
HIV-1 RNA Quant, Log: NOT DETECTED Log copies/mL

## 2021-08-31 NOTE — Progress Notes (Unsigned)
Patient ID: Ashley Vaughn, female   DOB: 01/22/1966, 56 y.o.   MRN: [id number]  Received message to call Ashley Vaughn regarding flushing of drain instruction.  Ms. Aronov reports not remembering the new instructions on flushing her drain and not being able to make sense of the written instructions given. Reminder given that she no longer needs to flush the drain catheter (due to fistula) and that she only has to flush the bag tubing if she desires as she was complaining of foul smelling output from the tubing.  I was able to talk her through flushing of the drain bag using the attached 3-way stopcock.  She voiced successful flushing and understanding.  She understands she can call with any additional questions or concerns.  Electronically Signed: Sheliah Plane, PA-C 08/31/2021, 10:37 AM

## 2021-09-03 ENCOUNTER — Telehealth: Payer: Self-pay

## 2021-09-03 ENCOUNTER — Other Ambulatory Visit: Payer: Self-pay | Admitting: Infectious Diseases

## 2021-09-03 DIAGNOSIS — K3533 Acute appendicitis with perforation and localized peritonitis, with abscess: Secondary | ICD-10-CM

## 2021-09-03 NOTE — Telephone Encounter (Signed)
Left voicemail asking patient to return my call.   Thena Devora P Jaequan Propes, CMA  

## 2021-09-03 NOTE — Telephone Encounter (Signed)
-----   Message from Blanchard Kelch, NP sent at 09/03/2021  8:14 AM EDT ----- Please call Ms. Baruch to let her know that she has an undetectable viral load and good looking CD4 count of 1076.   Her Diabetes needs urgent attention with internal medicine team - this is her priority and certainly will contribute to how yucky she has been feeling lately.   I would recommend she call to make an appointment with Dr. Ninetta Lights for ongoing ID care in the internal medicine clinic in 3 months.

## 2021-09-04 ENCOUNTER — Encounter: Payer: Medicaid Other | Admitting: Internal Medicine

## 2021-09-04 DIAGNOSIS — R634 Abnormal weight loss: Secondary | ICD-10-CM | POA: Insufficient documentation

## 2021-09-04 NOTE — Telephone Encounter (Signed)
Called patient  back regarding results and with appointment reminder for internal medicine. Patient did not have any questions about results. States she is not able to make appointment with Internal Medicine and will need to reschedule. Would like to continue care with Rexene Alberts, Np. Is scheduled for 3 month follow up.  Patient will call internal medicine today and request later appointment time. [name], RMA

## 2021-09-04 NOTE — Assessment & Plan Note (Signed)
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 Following with IM clinic and surgery team.

## 2021-09-04 NOTE — Assessment & Plan Note (Signed)
She reports intermittent adherence but back on track for a week or so now. Will update pertinent labs and have her follow up with Dr. Ninetta Lights in 3-4 months to check in with him. No changes to ART at this time - pending geno.

## 2021-09-04 NOTE — Progress Notes (Addendum)
Patient was no-show

## 2021-09-04 NOTE — Assessment & Plan Note (Signed)
Suspect related to uncontrolled diabetes and slow to recover from surgery procedure.  Despite intermittent reports of HIV medications she had an intact CD4 count at last check > 1000 and I doubt this is the cause.  Recommended to follow up closely with internal medicine team.

## 2021-09-06 ENCOUNTER — Other Ambulatory Visit: Payer: Self-pay | Admitting: *Deleted

## 2021-09-06 ENCOUNTER — Other Ambulatory Visit: Payer: Self-pay | Admitting: Internal Medicine

## 2021-09-06 DIAGNOSIS — I5020 Unspecified systolic (congestive) heart failure: Secondary | ICD-10-CM

## 2021-09-06 DIAGNOSIS — E1140 Type 2 diabetes mellitus with diabetic neuropathy, unspecified: Secondary | ICD-10-CM

## 2021-09-06 MED ORDER — CLOPIDOGREL BISULFATE 75 MG PO TABS: 75.0000 mg | ORAL_TABLET | Freq: Every day | ORAL | 0 refills | Status: DC

## 2021-09-06 MED ORDER — LANTUS SOLOSTAR 100 UNIT/ML ~~LOC~~ SOPN: 30.0000 [IU] | PEN_INJECTOR | Freq: Every day | SUBCUTANEOUS | 3 refills | Status: DC

## 2021-09-06 MED ORDER — CARVEDILOL 6.25 MG PO TABS
6.2500 mg | ORAL_TABLET | Freq: Two times a day (BID) | ORAL | 11 refills | Status: DC
Start: 2021-09-06 — End: 2021-09-27

## 2021-09-06 MED ORDER — BD PEN NEEDLE MINI U/F 31G X 5 MM MISC: 3 refills | Status: DC

## 2021-09-06 MED ORDER — ACCU-CHEK GUIDE VI STRP: ORAL_STRIP | 3 refills | Status: DC

## 2021-09-06 MED ORDER — DULOXETINE HCL 60 MG PO CPEP: 60.0000 mg | ORAL_CAPSULE | Freq: Every day | ORAL | 0 refills | Status: DC

## 2021-09-06 MED ORDER — ASPIRIN 81 MG PO TBEC: 81.0000 mg | DELAYED_RELEASE_TABLET | Freq: Every day | ORAL | 12 refills | Status: DC

## 2021-09-06 MED ORDER — SYNJARDY 5-500 MG PO TABS: 1.0000 | ORAL_TABLET | Freq: Two times a day (BID) | ORAL | 2 refills | Status: DC

## 2021-09-06 MED ORDER — ACCU-CHEK SOFTCLIX LANCET DEV KIT: PACK | 1 refills | Status: DC

## 2021-09-06 MED ORDER — ACCU-CHEK SOFTCLIX LANCETS MISC: 3 refills | Status: DC

## 2021-09-06 NOTE — Telephone Encounter (Signed)
I called and talked with patient to clarify what type of insulin she has been injecting since being out of the hospital. She is currently at home and states that the insulin is Lantus. She has been injecting 30 units daily and blood sugars have been in 200s. She needs to follow-up next week in person for diabetes management.

## 2021-09-06 NOTE — Telephone Encounter (Signed)
Call from patient needs refill of all meds sent to [facility] on [address].

## 2021-09-07 ENCOUNTER — Telehealth: Payer: Self-pay

## 2021-09-07 NOTE — Telephone Encounter (Signed)
Pa for  pt [facility] ) came through via fax from pharmacy was completed and submitted to [place] [employer] .Marland Kitchen      [employer] will  send the approval or denial to patient's home address

## 2021-09-11 ENCOUNTER — Ambulatory Visit (INDEPENDENT_AMBULATORY_CARE_PROVIDER_SITE_OTHER): Payer: Medicaid Other | Admitting: Infectious Diseases

## 2021-09-11 ENCOUNTER — Encounter: Payer: Self-pay | Admitting: Infectious Diseases

## 2021-09-11 ENCOUNTER — Other Ambulatory Visit: Payer: Self-pay

## 2021-09-11 VITALS — BP 97/67 | HR 96 | Resp 16 | Ht 62.0 in | Wt 133.0 lb

## 2021-09-11 DIAGNOSIS — B20 Human immunodeficiency virus [HIV] disease: Secondary | ICD-10-CM

## 2021-09-11 NOTE — Patient Instructions (Addendum)
We will check with [facility] to get an idea as to who your case manager is so they can try to help you with your needs.   Please try to make it to your appointment tomorrow with your diabetes team - that is the most important thing you can help with.   Will plan to see you back in October for regular follow up for HIV care

## 2021-09-11 NOTE — Progress Notes (Addendum)
Ashley Vaughn is not sure why she has an appointment today.  Recalls we had phone conversation with her recently to go over labs.   She has no additional needs / concerns for [place] to address today. No charge for the visit.

## 2021-09-12 ENCOUNTER — Encounter: Payer: Medicaid Other | Admitting: Internal Medicine

## 2021-09-12 ENCOUNTER — Other Ambulatory Visit: Payer: Self-pay | Admitting: Infectious Diseases

## 2021-09-12 DIAGNOSIS — K3533 Acute appendicitis with perforation and localized peritonitis, with abscess: Secondary | ICD-10-CM

## 2021-09-12 NOTE — Progress Notes (Deleted)
PMH uncontrolled DM,   DM Medications: lantus 30 units qd,empa-metformin 5-500 mg qd (started at beginning of July)  CVA Asa 81, plavix 75 mg, rosuvastatin 10  HFrEF Medications:carvedilol 6.25 mg BID,  Start entresto? Referral to card  Drain Appt w gen surgery at [facility] on 8/9  Constipation

## 2021-09-13 ENCOUNTER — Other Ambulatory Visit: Payer: Self-pay | Admitting: Student

## 2021-09-13 ENCOUNTER — Encounter: Payer: Medicaid Other | Admitting: Internal Medicine

## 2021-09-13 ENCOUNTER — Other Ambulatory Visit: Payer: Self-pay | Admitting: Internal Medicine

## 2021-09-14 ENCOUNTER — Ambulatory Visit (HOSPITAL_COMMUNITY)
Admission: RE | Admit: 2021-09-14 | Discharge: 2021-09-14 | Disposition: A | Discharge status: Home / Self Care | Payer: Medicaid Other | Source: Ambulatory Visit | Attending: Infectious Diseases | Admitting: Infectious Diseases

## 2021-09-14 ENCOUNTER — Other Ambulatory Visit: Payer: Self-pay

## 2021-09-14 ENCOUNTER — Other Ambulatory Visit (HOSPITAL_COMMUNITY): Payer: Self-pay | Admitting: Student

## 2021-09-14 DIAGNOSIS — K3533 Acute appendicitis with perforation and localized peritonitis, with abscess: Secondary | ICD-10-CM | POA: Insufficient documentation

## 2021-09-14 HISTORY — PX: IR CATHETER TUBE CHANGE: IMG717

## 2021-09-14 LAB — GLUCOSE, CAPILLARY: Glucose-Capillary: 238 mg/dL — ABNORMAL HIGH (ref 70–99)

## 2021-09-14 MED ORDER — IOHEXOL 300 MG/ML  SOLN
100.0000 mL | Freq: Once | INTRAMUSCULAR | Status: AC | PRN
  Administered 2021-09-14: 30 mL

## 2021-09-14 MED ORDER — LIDOCAINE HCL (PF) 1 % IJ SOLN
INTRAMUSCULAR | Status: AC | PRN
  Administered 2021-09-14: 5 mL

## 2021-09-14 MED ORDER — LIDOCAINE HCL 1 % IJ SOLN
INTRAMUSCULAR | Status: AC
  Filled 2021-09-14: qty 20

## 2021-09-14 MED ORDER — MIDAZOLAM HCL 2 MG/2ML IJ SOLN
INTRAMUSCULAR | Status: AC
  Filled 2021-09-14: qty 2

## 2021-09-14 MED ORDER — FENTANYL CITRATE (PF) 100 MCG/2ML IJ SOLN
INTRAMUSCULAR | Status: AC
  Filled 2021-09-14: qty 2

## 2021-09-14 MED ORDER — SODIUM CHLORIDE 0.9 % IV SOLN: INTRAVENOUS | Status: DC

## 2021-09-14 NOTE — Procedures (Signed)
Pre procedural Dx: Chronic periappendiceal abscess Post procedural Dx: Same  Technically successful CT guided exchange, repositioning and up-sizing of now 14 Fr drainage catheter placement into the chronic periappendiceal and body wall abscess. Drain connected to gravity bag.   EBL: Trace Complications: None immediate  Katherina Right, MD Pager #: [phone]

## 2021-09-14 NOTE — Sedation Documentation (Signed)
Pt discharged home with Ashley Vaughn. Discharge instructions given. All of the patients questions answered prior to patients discharge. Pt alert and talking with RN. Pt informed that as per Dr. Grace Isaac, Surgeon from [facility] will reach out next week, but if not, pt is to call the surgeons office.

## 2021-09-14 NOTE — Progress Notes (Signed)
CT scan results noted -  she has IR and surgical team managing this for her. I forwarded her results to [facility] Surgery office who have been following and plan for diagnostic CT and drain exchange with Dr. Kenna Gilbert team.

## 2021-09-14 NOTE — H&P (Signed)
Chief Complaint: Patient was seen in consultation today for appendiceal abscess drain exchange  Referring Physician(s): Dixon,Stephanie N  Supervising Physician: Dr. Pascal Lux  Patient Status: [facility] - Out-pt  History of Present Illness: Ashley Vaughn is a 56 y.o. female with a medical history significant for depression, DM, HIV, HSV, seizures and appendicitis s/p appendectomy by Dr. Cheri Fowler at [facility]. Her appendectomy was complicated by in intra-abdominal fluid collection and she was seen in IR at [facility] for drain placement 07/04/21 by Dr. Serafina Royals.   She presented to the ED at [facility] 07/18/21 with seizures and abdominal pain. Imaging showed a small residual fluid collection. Per report from the patient's daughter the drain was not being flushed at home and there had been no output. She was seen in IR 07/19/21 for a RLQ drain exchange. She was discharged home 07/27/21.   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 Due to the fistula the patient was instructed to not flush the drain.   The patient was evaluated by Infectious Disease a few days ago and the catheter was leaking. A request was made for IR to evaluate her for possible drain exchange. She presents today for CT imaging and drain exchange/possible drain reposition.   CT Abdomen/pelvis without contrast 09/14/21 IMPRESSION: 1. Right lower quadrant abscess drain is unchanged in position. Evaluation for abscess is limited due to lack of IV contrast. There does appear to be greater inflammation/abscess located posterior and lateral to the drain pigtail, within the posterolateral right muscle wall/subcutaneous tissues. The intra-abdominal portion of the abscess has resolved. 2. Progressive widening and cortical irregularity of the L3-L4 facet joints suspicious for inflammatory/infectious arthropathy. Further evaluation with  contrast enhanced MRI of the lumbar spine would be beneficial.   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   Seizure ([place]) 07/18/2021    Past Surgical History:  Procedure Laterality Date   ABDOMINAL HYSTERECTOMY     BUBBLE STUDY  07/23/2021   Procedure: BUBBLE STUDY;  Surgeon: Buford Dresser, MD;  Location: [facility];  Service: Cardiovascular;;   IR CATHETER TUBE CHANGE  07/19/2021   IR RADIOLOGIST EVAL & MGMT  08/09/2021   IR RADIOLOGIST EVAL & MGMT  08/29/2021   TEE WITHOUT CARDIOVERSION N/A 07/23/2021   Procedure: TRANSESOPHAGEAL ECHOCARDIOGRAM (TEE);  Surgeon: Buford Dresser, MD;  Location: [facility] ENDOSCOPY;  Service: Cardiovascular;  Laterality: N/A;    Allergies: Fluorescein  Medications: Prior to Admission medications   Medication Sig Start Date End Date Taking? Authorizing Provider  aspirin EC 81 MG tablet Take 1 tablet (81 mg total) by mouth daily. Swallow whole. 09/06/21  Yes Masters, Katie, DO  carvedilol (COREG) 6.25 MG tablet Take 1 tablet (6.25 mg total) by mouth 2 (two) times daily. 09/06/21 09/06/22 Yes Masters, Katie, DO  Cholecalciferol (VITAMIN D3) 5000 units TABS Take  5,000 Units by mouth daily.   Yes [provider]  clopidogrel (PLAVIX) 75 MG tablet Take 1 tablet (75 mg total) by mouth daily. 09/06/21  Yes Masters, Katie, DO  DULoxetine (CYMBALTA) 20 MG capsule Take 20 mg by mouth 2 (two) times daily. 09/05/21  Yes  [provider]  Empagliflozin-metFORMIN HCl (SYNJARDY) 5-500 MG TABS Take 1 tablet by mouth 2 (two) times daily. 09/06/21  Yes Masters, Katie, DO  insulin glargine (LANTUS SOLOSTAR) 100 UNIT/ML Solostar Pen Inject 30 Units into the skin daily. 09/06/21 03/25/22 Yes Masters, Katie, DO  ODEFSEY 200-25-25 MG TABS tablet Take 1 tablet by mouth every morning. 09/06/21  Yes [provider]  rosuvastatin (CRESTOR) 10 MG tablet Take 1 tablet by mouth daily. 07/28/21  Yes [provider]  Accu-Chek Softclix Lancets lancets Check blood sugar 3 times per day 09/06/21   Masters, Joellen Jersey, DO  B-D UF III MINI PEN NEEDLES 31G X 5 MM MISC Use to inject insulin one time daily 09/06/21   Masters, Joellen Jersey, DO  Blood Glucose Monitoring Suppl (ACCU-CHEK GUIDE ME) w/Device KIT 1 kit by Does not apply route daily. 09/30/19   Kuppelweiser, Cassie L, RPH-CPP  Blood Glucose Monitoring Suppl (ACCU-CHEK NANO SMARTVIEW) w/Device KIT 1 each by Does not apply route 2 (two) times daily. Check blood sugar 4x a day before meals and bedtime  2 days each week. Dx code- 250.00. 07/18/21   Scarlett Presto, MD  glucose blood (ACCU-CHEK GUIDE) test strip Check blood sugar 3 times per day 09/06/21   Masters, Joellen Jersey, DO  Lancets Misc. (ACCU-CHEK SOFTCLIX LANCET DEV) KIT Use to check blood sugar up to 3 times a day before meals. 09/06/21   Masters, Joellen Jersey, DO     Family History  Problem Relation Age of Onset   Diabetes Mother    Hypertension Mother    Heart disease Father    Arthritis Father    Diabetes Maternal Uncle     Social History   Socioeconomic History   Marital status: Legally Separated    Spouse name: Not on file   Number of children: Not on file   Years of education: Not on file   Highest education level: Not on file  Occupational History   Not on file  Tobacco Use   Smoking status: Never    Passive exposure: Current   Smokeless tobacco: Never  Vaping Use   Vaping Use: Never used  Substance and Sexual  Activity   Alcohol use: Yes    Alcohol/week: 0.0 standard drinks of alcohol    Comment: occasional   Drug use: No    Comment: h/o cocaine abuse, clean since 2009   Sexual activity: Never    Partners: Male    Comment: pt. declined condoms  Other Topics Concern   Not on file  Social History Narrative   Not on file   Social Determinants of Health   Financial Resource Strain: High Risk (05/23/2021)   Overall Financial Resource Strain (CARDIA)    Difficulty of Paying Living Expenses: Very hard  Food Insecurity: Food Insecurity Present (05/23/2021)   Hunger Vital Sign    Worried About Running Out of Food in the Last Year: Often true    Ran Out of Food in the Last Year: Often true  Transportation Needs: No Transportation Needs (08/16/2021)   PRAPARE - Hydrologist (Medical): No    Lack of Transportation (Non-Medical): No  Physical Activity: Not on file  Stress: Not  on file  Social Connections: Not on file    Review of Systems: A 12 point ROS discussed and pertinent positives are indicated in the HPI above.  All other systems are negative.  Review of Systems  Constitutional:  Positive for fatigue. Negative for appetite change.  Respiratory:  Negative for cough and shortness of breath.   Cardiovascular:  Negative for chest pain and leg swelling.  Gastrointestinal:  Negative for diarrhea, nausea and vomiting.  Neurological:  Negative for dizziness and headaches.    Vital Signs: BP (!) 144/80   Pulse 89   Temp 97.8 F (36.6 C) (Temporal)   Resp 16   Ht $R'5\' 2"'fX$  (1.575 m)   Wt 132 lb 15 oz (60.3 kg)   SpO2 100%   BMI 24.31 kg/m   Physical Exam Constitutional:      General: She is not in acute distress.    Appearance: She is not ill-appearing.  HENT:     Mouth/Throat:     Mouth: Mucous membranes are moist.     Pharynx: Oropharynx is clear.  Cardiovascular:     Rate and Rhythm: Normal rate and regular rhythm.     Pulses: Normal pulses.     Heart  sounds: Normal heart sounds.  Pulmonary:     Effort: Pulmonary effort is normal.     Breath sounds: Normal breath sounds.  Abdominal:     Palpations: Abdomen is soft.     Tenderness: There is no abdominal tenderness.     Comments: RLQ drain to gravity. There is scant purulent/feculant drainage in bag. The dressing is saturated with this same material. It is extremely malodorous.   Skin:    General: Skin is warm and dry.  Neurological:     Mental Status: She is oriented to person, place, and time.     Imaging: CT ABDOMEN PELVIS WO CONTRAST  Result Date: 09/14/2021 CLINICAL DATA:  Appendiceal abscess? Possible drain removal EXAM: CT ABDOMEN AND PELVIS WITHOUT CONTRAST TECHNIQUE: Multidetector CT imaging of the abdomen and pelvis was performed following the standard protocol without IV contrast. RADIATION DOSE REDUCTION: This exam was performed according to the departmental dose-optimization program which includes automated exposure control, adjustment of the mA and/or kV according to patient size and/or use of iterative reconstruction technique. COMPARISON:  08/29/2021 FINDINGS: Lower chest: Trace pericardial effusion. Lung bases otherwise clear. Hepatobiliary: No focal liver abnormality is seen. No gallstones, gallbladder wall thickening, or biliary dilatation. Pancreas: Unremarkable. No pancreatic ductal dilatation or surrounding inflammatory changes. Spleen: Normal in size without focal abnormality. Adrenals/Urinary Tract: Adrenal glands are unremarkable. Kidneys are normal, without renal calculi, focal lesion, or hydronephrosis. Bladder is unremarkable. Stomach/Bowel: No bowel dilatation to indicate ileus or obstruction. Right lower quadrant abscess drain is again seen. Evaluation for residual abscess is limited due to lack of IV contrast. Right lower quadrant drain is unchanged in position. There appears to be greater inflammation and fluid collection located in the muscle wall/subcutaneous  tissues just above the right iliac crest this fluid density region currently measures 4.3 x 2.3 x 3.8 cm compared to the prior examination from 08/29/2021 with this region measured approximately 4.7 x 2.7 x 2.2 cm. Vascular/Lymphatic: No enlarged abdominal or pelvic lymph nodes. Scattered minimal calcified atheromatous plaque of the abdominal aorta and visualized branches. Reproductive: Status post hysterectomy. No adnexal masses. Other: No abdominal wall hernia or abnormality. No abdominopelvic ascites. Musculoskeletal: Mild bilateral hip osteoarthrosis. Widening of the facet joints bilaterally at L3-L4 with irregularity of the underlying  osseous cortex has progressed when compared to older examination from 03/01/2021. IMPRESSION: 1. Right lower quadrant abscess drain is unchanged in position. Evaluation for abscess is limited due to lack of IV contrast. There does appear to be greater inflammation/abscess located posterior and lateral to the drain pigtail, within the posterolateral right muscle wall/subcutaneous tissues. The intra-abdominal portion of the abscess has resolved. 2. Progressive widening and cortical irregularity of the L3-L4 facet joints suspicious for inflammatory/infectious arthropathy. Further evaluation with contrast enhanced MRI of the lumbar spine would be beneficial. Electronically Signed   By: Miachel Roux M.D.   On: 09/14/2021 09:06   DG Sinus/Fist Tube Chk-Non GI  Result Date: 08/29/2021 CLINICAL DATA:  History of laparoscopic appendectomy (procedure performed at [facility] by Dr. Cheri Fowler) complicated by development of postoperative abscess requiring percutaneous drainage catheter placement on 07/04/2020 with subsequent drainage catheter exchange and up sizing performed 07/19/2021. Patient returns today to the interventional radiology drain clinic for drainage catheter evaluation and management. Still has moderate drain output ranging from minimal to 100 mL per day.  EXAM: ABSCESS INJECTION COMPARISON:  None Available. CONTRAST:  17 mL Omnipaque 300-administered via the existing percutaneous drain. FLUOROSCOPY TIME:  42 seconds, 2.8 mGy TECHNIQUE: The patient was positioned supine on the fluoroscopy table. A preprocedural spot fluoroscopic image was obtained of the right lower quadrant and the existing percutaneous drainage catheter. Multiple spot fluoroscopic and radiographic images were obtained following the injection of a small amount of contrast via the existing percutaneous drainage catheter. FINDINGS: Unchanged position of catheter. Trace residual right lower quadrant fluid collection with persistent fistulous connection to the cecum. 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 PLAN: Do not flush drain. Keep to bag drainage. follow up with Surgery. Follow up in IR drain clinic in 2 months. Ruthann Cancer, MD [facility] Electronically Signed   By: Ruthann Cancer M.D.   On: 08/29/2021 16:14   CT ABDOMEN PELVIS WO CONTRAST  Result Date: 08/29/2021 CLINICAL DATA:  56 year old female with history of appendiceal abscess status post percutaneous drain placement on 07/04/2021 with cecal fistula on most recent drain evaluation on 08/09/2021. She returns today for repeat evaluation. The patient reports anywhere from small amounts to 100 cc of output from the drain per day. EXAM: CT ABDOMEN AND PELVIS WITHOUT CONTRAST TECHNIQUE: Multidetector CT imaging of the abdomen and pelvis was performed following the standard protocol without IV contrast. RADIATION DOSE REDUCTION: This exam was performed according to the departmental dose-optimization program which includes automated exposure control, adjustment of the mA and/or kV according to patient size and/or use of iterative reconstruction technique. COMPARISON:  08/09/2021, 07/19/2021, 07/18/2021, 07/04/2021 FINDINGS: Lower  chest: No acute abnormality. Hepatobiliary: No focal liver abnormality is seen. No gallstones, gallbladder wall thickening, or biliary dilatation. Pancreas: Unremarkable. No pancreatic ductal dilatation or surrounding inflammatory changes. Spleen: Normal in size without focal abnormality. Adrenals/Urinary Tract: Adrenal glands are unremarkable. Kidneys are normal, without renal calculi, focal lesion, or hydronephrosis. Bladder is unremarkable. Stomach/Bowel: Stomach is within normal limits. Appendix surgically absent. No evidence of bowel wall thickening, distention, or inflammatory changes. Vascular/Lymphatic: Aortic atherosclerosis. No enlarged abdominal or pelvic lymph nodes. Reproductive: Status post hysterectomy. No adnexal masses. Other: Right lower quadrant, lateral pericecal drain remains in place. There is trace residual gas in the overlying posterolateral oblique musculature. No evidence of significant residual pericecal fluid collection. Musculoskeletal: No acute or significant osseous findings. IMPRESSION: No evidence of significant residual pericecal fluid  collection at the site of indwelling pigtail drainage catheter which is unchanged in position, given limitations absent intravenous contrast. Ruthann Cancer, MD [facility] Radiology Electronically Signed   By: Ruthann Cancer M.D.   On: 08/29/2021 16:09   IR Radiologist Eval & Mgmt  Result Date: 08/29/2021 Please refer to notes tab for details about interventional procedure. (Op Note)   Labs:  CBC: Recent Labs    07/25/21 0419 07/26/21 0116 07/27/21 0306 08/24/21 1200 08/24/21 1242  WBC 5.8 6.0 5.6 8.1  --   HGB 9.0* 8.5* 9.0* 9.3* 10.2*  HCT 29.3* 27.9* 29.6* 30.8* 30.0*  PLT 464* 448* 488* 594*  --     COAGS: Recent Labs    07/20/21 0347  INR 1.1    BMP: Recent Labs    07/25/21 1552 07/26/21 0116 07/27/21 0306 08/23/21 1412 08/24/21 1200 08/24/21 1242  NA 137 136 137  131* 135 134*  K 4.9 4.3 4.3 5.0 4.4 4.6  CL 108 107 109 92* 99  --   CO2 20* 21* 23 19* 24  --   GLUCOSE 180* 139* 109* 716* 348*  --   BUN 18 19 22* 17 16  --   CALCIUM 9.2 8.9 8.7* 9.2 9.3  --   CREATININE 3.50* 3.39* 3.43* 1.16* 1.09*  --   GFRNONAA 15* 15* 15*  --  60*  --     LIVER FUNCTION TESTS: Recent Labs    07/23/21 0643 07/24/21 0323 07/25/21 0419 08/24/21 1200  BILITOT 0.5 0.2* 0.4 0.4  AST $Re'20 20 22 'kCg$ 12*  ALT $Re'22 19 19 13  'UJt$ ALKPHOS 123 132* 112 85  PROT 6.9 6.9 6.8 7.3  ALBUMIN 2.6* 2.7* 2.7* 3.0*    TUMOR MARKERS: No results for input(s): "AFPTM", "CEA", "CA199", "CHROMGRNA" in the last 8760 hours.  Assessment and Plan:  Appendicitis s/p appendectomy with post-op abscess: Ashley Vaughn. [place], 56 year old female, presents today to the [facility] department for an image-guided drain exchange with possible reposition.   Risks and benefits discussed with the patient including bleeding, infection, damage to adjacent structures and sepsis.  All of the patient's questions were answered, patient is agreeable to proceed. She has been NPO.   Consent signed and in chart.  Thank you for this interesting consult.  I greatly enjoyed meeting Ashley Vaughn and look forward to participating in their care.  A copy of this report was sent to the requesting provider on this date.  Electronically Signed: Soyla Dryer, AGACNP-BC [phone] 09/14/2021, 10:54 AM   I spent a total of  30 Minutes   in face to face in clinical consultation, greater than 50% of which was counseling/coordinating care for drain exchange with sedation.

## 2021-09-27 ENCOUNTER — Encounter: Payer: Self-pay | Admitting: Student

## 2021-09-27 ENCOUNTER — Ambulatory Visit (INDEPENDENT_AMBULATORY_CARE_PROVIDER_SITE_OTHER): Payer: Medicaid Other | Admitting: Student

## 2021-09-27 ENCOUNTER — Telehealth: Payer: Self-pay

## 2021-09-27 ENCOUNTER — Other Ambulatory Visit: Payer: Self-pay

## 2021-09-27 VITALS — BP 149/86 | HR 94 | Temp 97.7°F | Ht 62.0 in | Wt 125.9 lb

## 2021-09-27 DIAGNOSIS — I6319 Cerebral infarction due to embolism of other precerebral artery: Secondary | ICD-10-CM

## 2021-09-27 DIAGNOSIS — E1140 Type 2 diabetes mellitus with diabetic neuropathy, unspecified: Secondary | ICD-10-CM | POA: Diagnosis not present

## 2021-09-27 DIAGNOSIS — R634 Abnormal weight loss: Secondary | ICD-10-CM

## 2021-09-27 DIAGNOSIS — Z0000 Encounter for general adult medical examination without abnormal findings: Secondary | ICD-10-CM

## 2021-09-27 DIAGNOSIS — B20 Human immunodeficiency virus [HIV] disease: Secondary | ICD-10-CM

## 2021-09-27 DIAGNOSIS — G3184 Mild cognitive impairment, so stated: Secondary | ICD-10-CM | POA: Diagnosis not present

## 2021-09-27 DIAGNOSIS — K5900 Constipation, unspecified: Secondary | ICD-10-CM

## 2021-09-27 DIAGNOSIS — Z794 Long term (current) use of insulin: Secondary | ICD-10-CM | POA: Diagnosis not present

## 2021-09-27 DIAGNOSIS — I5020 Unspecified systolic (congestive) heart failure: Secondary | ICD-10-CM | POA: Diagnosis not present

## 2021-09-27 DIAGNOSIS — E1165 Type 2 diabetes mellitus with hyperglycemia: Secondary | ICD-10-CM

## 2021-09-27 MED ORDER — ROSUVASTATIN CALCIUM 10 MG PO TABS: 10.0000 mg | ORAL_TABLET | Freq: Every day | ORAL | 11 refills | Status: DC

## 2021-09-27 MED ORDER — CARVEDILOL 6.25 MG PO TABS
6.2500 mg | ORAL_TABLET | Freq: Two times a day (BID) | ORAL | 11 refills | Status: DC
Start: 2021-09-27 — End: 2021-11-01

## 2021-09-27 MED ORDER — CLOPIDOGREL BISULFATE 75 MG PO TABS: 75.0000 mg | ORAL_TABLET | Freq: Every day | ORAL | 11 refills | Status: DC

## 2021-09-27 MED ORDER — DULOXETINE HCL 20 MG PO CPEP: 20.0000 mg | ORAL_CAPSULE | Freq: Two times a day (BID) | ORAL | 11 refills | Status: DC

## 2021-09-27 MED ORDER — SYNJARDY 5-500 MG PO TABS: 1.0000 | ORAL_TABLET | Freq: Two times a day (BID) | ORAL | 11 refills | Status: DC

## 2021-09-27 NOTE — Telephone Encounter (Signed)
   Telephone encounter was:  Unsuccessful.  09/27/2021 Name: Ashley Vaughn MRN: [id number] DOB: [birth year]  Unsuccessful outbound call made today to assist with:  Food Insecurity and HOUSING  Outreach Attempt:  1st Attempt  A HIPAA compliant voice message was left requesting a return call.  Instructed patient to call back at earliest convenience. Lenard Forth Care Guide, Embedded Care Coordination [facility], Care Management  [phone] [address] [place], [place], [place] [address] Phone: [phone] Email: [email or web address]

## 2021-09-27 NOTE — Assessment & Plan Note (Addendum)
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  CTA head and neck showed embolic strokes with possible small clot internal right ICA. Diffuse atherosclerosis. Cardiac evaluation showed LV EF 30-35%, raising suspicion for cardiogenic embolism. No evidence of clot or vegetation. Patient has a chronic history of anemia, reason why she was not started on anticoagulation. Started on Plavix 75 mg per Neurology. Patient has not seen nerology for follow up. Last LDL 90. Patient continues to be forgetful, not able to take medications consistently. This has worsen since her CVA. Brought medications to clinic, provider counted and not many pills used (<10). Patient does feel like she walks slower since the stroke. No focal abnormalities on exam. Patient is oriented x3. Concerned about patient's current medication noncompliance, and worry that this is in the setting of cognitive impairment. Will have conversation with pharmacist to switch patient's medications to bubble packs for ease of management. Patient would likely benefit from home health aid to help with medication management and home and chronic disease supervision as patient lives alone and is at high risk of decompensation from chronic conditions.  - Continue Plavix - Continue Crestor 10 mg

## 2021-09-27 NOTE — Progress Notes (Signed)
Subjective:  CC: diabetes follow up  HPI:  [name] is a 56 y.o. female with a past medical history stated below and presents today for diabetes follow up. Please see problem based assessment and plan for additional details.  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   [place]/18[place]    Current Outpatient Medications on File Prior to Visit  Medication Sig Dispense Refill   Accu-Chek Softclix Lancets lancets Check blood sugar 3 times per day 270 each 3   B-D UF III MINI PEN NEEDLES 31G X 5 MM MISC Use to inject insulin one time daily 100 each 3   Blood Glucose Monitoring Suppl (ACCU-CHEK GUIDE ME) w/Device KIT 1 kit by Does not apply route daily. 1 kit 0   Blood Glucose Monitoring Suppl (ACCU-CHEK NANO SMARTVIEW) w/Device KIT 1 each by Does not apply route 2 (two) times daily. Check blood sugar 4x a day before meals and bedtime  2 days each week. Dx code- 250.00. 1 kit 0   glucose blood (ACCU-CHEK GUIDE) test strip Check blood sugar 3 times per day 270 each 3   insulin glargine (LANTUS SOLOSTAR) 100 UNIT/ML Solostar Pen Inject 30 Units into the skin daily. 15 mL 3   Lancets Misc. (ACCU-CHEK SOFTCLIX LANCET DEV) KIT Use to check blood sugar up to 3 times a day before meals. 1 kit 1   ODEFSEY 200-25-25 MG TABS tablet Take 1 tablet by mouth every morning.     No current facility-administered medications on file prior to visit.    Family  History  Problem Relation Age of Onset   Diabetes Mother    Hypertension Mother    Heart disease Father    Arthritis Father    Diabetes Maternal Uncle     Social History   Socioeconomic History   Marital status: Legally Separated    Spouse name: Not on file   Number of children: Not on file   Years of education: Not on file   Highest education level: Not on file  Occupational History   Not on file  Tobacco Use   Smoking status: Never    Passive exposure: Current   Smokeless tobacco: Never  Vaping Use   Vaping Use: Never used  Substance and Sexual Activity   Alcohol use: Yes    Alcohol/week: 0.0 standard drinks of alcohol    Comment: occasional   Drug use: No    Comment: h/o cocaine abuse, clean since 2009   Sexual activity: Never    Partners: Male    Comment: pt. declined condoms  Other Topics Concern   Not on file  Social History Narrative   Not on file   Social Determinants of Health   Financial Resource Strain: High Risk (05/23/2021)   Overall Financial Resource Strain (CARDIA)    Difficulty of Paying Living Expenses: Very hard  Food Insecurity: Food Insecurity Present (09/27/2021)  Hunger Vital Sign    Worried About Charity fundraiser in the Last Year: Often true    Ran Out of Food in the Last Year: Often true  Transportation Needs: Unmet Transportation Needs (09/27/2021)   PRAPARE - Hydrologist (Medical): Yes    Lack of Transportation (Non-Medical): Yes  Physical Activity: Not on file  Stress: Not on file  Social Connections: Not on file  Intimate Partner Violence: Not on file    Review of Systems: ROS negative except for what is noted on the assessment and plan.  Objective:   Vitals:   09/27/21 0909  BP: (!) 149/86  Pulse: 94  Temp: 97.7 F (36.5 C)  TempSrc: Oral  SpO2: 100%  Weight: 125 lb 14.4 oz (57.1 kg)  Height: _0  (1.575 m)    Physical Exam: Constitutional: Ill-appearing person sitting in  examination, in emotional distress, but denying pain HENT: normocephalic atraumatic, mucous membranes moist Eyes: conjunctiva non-erythematous Neck: supple Cardiovascular: regular rate and rhythm, no m/r/g Pulmonary/Chest: normal work of breathing on room air, lungs clear to auscultation bilaterally Abdominal: Normal bowel sounds. soft, non-tender, non-distended. Right lower quadrant drain in place. Skin surrounding the tube without erythema, fluctuance or purulence. However, outward facing gauze covered in fecal matter. MSK: Low muscle mass, but good tone. Full range of motion. Neurological: alert & oriented x 3, 5/5 strength in bilateral upper and lower extremities, normal gait Skin: warm and dry. Malodorous, dry scales on skin throughout.  Psych: labile mood and depressed affect.      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  CTA head and neck showed embolic strokes with possible small clot internal right ICA. Diffuse atherosclerosis. Cardiac evaluation showed LV EF 30-35%, raising suspicion for cardiogenic embolism. No evidence of clot or vegetation. Patient has a chronic history of anemia, reason why she was not started on anticoagulation. Started on Plavix 75 mg per Neurology. Patient has not seen nerology for follow up. Last LDL 90. Patient continues to be forgetful, not able to take medications consistently. This has worsen since her CVA. Brought medications to clinic, provider counted and not many pills used (<10). Patient does feel like she walks slower since the stroke. No focal abnormalities on exam. Patient is oriented x3. Concerned about patient's current medication noncompliance, and worry that this is in the setting of cognitive impairment. Will have conversation with pharmacist to switch patient's medications to bubble packs for ease of management. Patient would likely benefit from home health aid to help with  medication management and home and chronic disease supervision as patient lives alone and is at high risk of decompensation from chronic conditions.  - Continue Plavix - Continue Crestor 10 mg  Mild cognitive impairment Ashley Vaughn is presenting to clinic with worsening forgetfulness and confusion about medical conditions, appointments, and medications since she was last hospitalized. 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 Brought medications to the clinic, which she picked up on 09/06/2021 and had only taken <10 pills out of every bottle. She mentions she forgets what each one is for and why she needs to take it. Admits she gets overwhelmed and does not take the medications.  There are reports of patient presenting to clinic visits confused about the reason she is there or forgetting her medical conditions. Today, the patient  also mentioned that sometimes she does not have food to eat, but that she also forgets to eat. Patient is oriented x3, however, her clinical presentation is concerning for worsening cognitive status. Attempted to get a MoCA today, she became overwhelmed finishing the first portion, which she was only able to draw the countor and numbers but not able to draw the hands, with a visual/spatial score of 2/5. Patient declined further testing today.  Will start her metabolic work up to rule out reversible etiologies of her memory impairments today and continue to assess her in the upcoming weeks as she is followed for her chronic conditions. Patient is also referred to home health services, transportation services, our social worker, and changing most of her medications to pill-pack form to decrease confusion and increase adherence.  - TSH today - B12 today  Encounter for screening involving social determinants of health (SDoH) Patient is currently living in [place] and having problems with her landlord. Currently not able to get  flowing water in the bathroom or toilet. Patiet has not bathed in 2 weeks. Patient presented to clinic after having right lower abdominal drain replaced by IR for post appendectomy retrocecal abscess, outer facing gauze and lower extremity covered in feces. Patient mentioned that her housing situation is difficult as she is having to use a commode in the living room.   Additionally, patient's access to food is limited. She is worried she's losing weight as she is only able to eat a small bite once a day, most days. Patient gets some charity food but it does not last her the whole week. She is stressed about the burden of having to come to many doctors appointments and not having reliable transportation from [employer]. She was able to pay someone to bring her to clinic today but that is not always the case. It is harder when she forgets that she does not actually have to come to the clinic and shows up anyway.   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 BG 233 today. Patient continues to be noncompliant, suspect memory decline given confusion. - Medication list being sent again as pill packs -Insulin and insulin supplies will need to be ordered separately  Weight loss  Suspect weight loss in the setting of recent health issues exacerbated by patient's food insecurity. Patient is not meeting daily caloric needs. See social  determinants of health. Her weight has been fluctuating 125-132 currently. - Will continue to address in upcoming visits  Seen with Dr. Philipp Ovens

## 2021-09-27 NOTE — Patient Instructions (Addendum)
Thank you, Ms.Delesa KIMAYA WHITLATCH for allowing [place] to provide your care today. Today we discussed your stroke and your housing and your transportation problems. We also talked about your problems remembering to take medications.   Our social worker is going to reach out to you to help you with transportation services for clinic visits and connect you with other food and housing resources.   We are also checking to see if there is a way Medicaid can send someone to check on you and help you at home with medication management.  Try to take your medications every day. We know it is hard but try your best.   I have ordered the following labs for you:   Lab Orders         TSH         Vitamin B12      I will call if any are abnormal. All of your labs can be accessed through "My Chart".Please follow-up in in 2 weeks.  Please make sure to arrive 15 minutes prior to your next appointment. If you arrive late, you may be asked to reschedule.    We look forward to seeing you next time. Please call our clinic at [phone] if you have any questions or concerns. The best time to call is Monday-Friday from 9am-4pm, but there is someone available 24/7. If after hours or the weekend, call the main hospital number and ask for the Internal Medicine Resident On-Call. If you need medication refills, please notify your pharmacy one week in advance and they will send [place] a request.   Thank you for letting us take part in your care. Wishing you the best!  Morene Crocker, MD 09/27/2021, 10:48 AM [facility] Internal Medicine Resident, PGY-1

## 2021-09-27 NOTE — Assessment & Plan Note (Signed)
Continues to be uncontrolled. BG 233 today. Patient continues to be noncompliant, suspect memory decline given confusion. - Medication list being sent again as pill packs -Insulin and insulin supplies will need to be ordered separately

## 2021-09-27 NOTE — Assessment & Plan Note (Signed)
Patient is currently living in [place] and having problems with her landlord. Currently not able to get flowing water in the bathroom or toilet. Patiet has not bathed in 2 weeks. Patient presented to clinic after having right lower abdominal drain replaced by IR for post appendectomy retrocecal abscess, outer facing gauze and lower extremity covered in feces. Patient mentioned that her housing situation is difficult as she is having to use a commode in the living room.   Additionally, patient's access to food is limited. She is worried she's losing weight as she is only able to eat a small bite once a day, most days. Patient gets some charity food but it does not last her the whole week. She is stressed about the burden of having to come to many doctors appointments and not having reliable transportation from [employer]. She was able to pay someone to bring her to clinic today but that is not always the case. It is harder when she forgets that she does not actually have to come to the clinic and shows up anyway.   -Referred patient to social work services to be connected with transportation and housing insecurity resources given uncontrolled, complex medical issues necessitating supervision and monitoring -Patient received food pantry and personal care items from the [facility] program -Drainage site was cleaned, dresses changes, and patient was provided with supplies to continue care in the short term -Patient would benefit from stacking appointments during the same day if in the same location to decrease financial burden and confusion

## 2021-09-27 NOTE — Assessment & Plan Note (Signed)
  Suspect weight loss in the setting of recent health issues exacerbated by patient's food insecurity. Patient is not meeting daily caloric needs. See social determinants of health. Her weight has been fluctuating 125-132 currently. - Will continue to address in upcoming visits

## 2021-09-27 NOTE — Assessment & Plan Note (Addendum)
Ashley Vaughn is presenting to clinic with worsening forgetfulness and confusion about medical conditions, appointments, and medications since she was last hospitalized. 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 Brought medications to the clinic, which she picked up on 09/06/2021 and had only taken <10 pills out of every bottle. She mentions she forgets what each one is for and why she needs to take it. Admits she gets overwhelmed and does not take the medications.  There are reports of patient presenting to clinic visits confused about the reason she is there or forgetting her medical conditions. Today, the patient also mentioned that sometimes she does not have food to eat, but that she also forgets to eat. Patient is oriented x3, however, her clinical presentation is concerning for worsening cognitive status. Attempted to get a MoCA today, she became overwhelmed finishing the first portion, which she was only able to draw the countor and numbers but not able to draw the hands, with a visual/spatial score of 2/5. Patient declined further testing today.  Will start her metabolic work up to rule out reversible etiologies of her memory impairments today and continue to assess her in the upcoming weeks as she is followed for her chronic conditions. Patient is also referred to home health services, transportation services, our social worker, and changing most of her medications to pill-pack form to decrease confusion and increase adherence.  - TSH today - B12 today

## 2021-09-28 ENCOUNTER — Telehealth: Payer: Self-pay

## 2021-09-28 LAB — VITAMIN B12: Vitamin B-12: 537 pg/mL (ref 232–1245)

## 2021-09-28 LAB — TSH: TSH: 2.68 u[IU]/mL (ref 0.450–4.500)

## 2021-09-28 NOTE — Telephone Encounter (Signed)
Mailing letter   Ashley Vaughn Care Guide, [name], Care Management  [phone] [address], [place], [name] [address] Phone: [phone] Email: [email or web address]    

## 2021-09-28 NOTE — Progress Notes (Signed)
Internal Medicine Clinic Attending  I saw and evaluated the patient.  I personally confirmed the key portions of the history and exam documented by Dr. Gomez-Caraballo and I reviewed pertinent patient test results.  The assessment, diagnosis, and plan were formulated together and I agree with the documentation in the resident's note.  

## 2021-10-02 ENCOUNTER — Ambulatory Visit
Admission: RE | Admit: 2021-10-02 | Discharge: 2021-10-02 | Disposition: A | Discharge status: Home / Self Care | Payer: Medicaid Other | Source: Ambulatory Visit | Attending: Infectious Diseases | Admitting: Infectious Diseases

## 2021-10-02 ENCOUNTER — Other Ambulatory Visit (HOSPITAL_COMMUNITY): Payer: Self-pay

## 2021-10-02 ENCOUNTER — Other Ambulatory Visit: Payer: Self-pay

## 2021-10-02 ENCOUNTER — Other Ambulatory Visit: Payer: Self-pay | Admitting: Infectious Diseases

## 2021-10-02 DIAGNOSIS — K3533 Acute appendicitis with perforation and localized peritonitis, with abscess: Secondary | ICD-10-CM

## 2021-10-02 HISTORY — PX: IR RADIOLOGIST EVAL & MGMT: IMG5224

## 2021-10-02 MED ORDER — NORMAL SALINE FLUSH 0.9 % IV SOLN
INTRAVENOUS | 1 refills | Status: DC
  Filled 2021-10-02: qty 750, 37d supply, fill #0

## 2021-10-02 NOTE — Progress Notes (Signed)
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 female status post laparoscopic appendectomy (procedure performed at [facility] by Dr. Cheri Fowler), complicated by development of postoperative abscess requiring percutaneous drainage catheter placement on 07/14/2021 with subsequent drainage catheter exchange and up sizing performed on 07/19/2021.  Follow-up evaluation on 09/14/2021 demonstrated a worsening abscess in the abdominal wall adjacent to the intraperitoneal drainage catheter.  The catheter was exchanged, upsized to 26F and repositioned such that it was well-positioned and communicating with both the cecal fistula and the abdominal wall abscess.   Patient presents now to IR for further follow-up.  CT imaging performed today demonstrates further improvement in the intra-abdominal and abdominal wall abscess cavities.  The drainage catheter remains in good position.  Patient reports persistent output of cloudy purulent appearing and foul-smelling fluid.  She is having at least 40 mL output daily.  She has a lot of tenderness at the tube insertion site due to movement of the tube.  Her social status is currently very poor.  She is nearly homeless and has an autistic child to care for her.  She is moving from house to house and staying with friends and family.  She continues to flush her catheter twice daily.  Contrast injection performed today demonstrates a persistent but slightly improved fistulous communication with the adjacent cecum.  Additionally, the abscess cavity is significantly smaller.  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   Seizure ([place] 07/18/2021    Past Surgical History:  Procedure Laterality Date   ABDOMINAL HYSTERECTOMY     BUBBLE STUDY  07/23/2021   Procedure: BUBBLE STUDY;  Surgeon: CBuford Dresser MD;  [facility]  Service: Cardiovascular;;   IR CATHETER TUBE CHANGE  07/19/2021   IR CATHETER TUBE CHANGE  09/14/2021   IR RADIOLOGIST EVAL & MGMT  08/09/2021   IR RADIOLOGIST EVAL & MGMT  08/29/2021   TEE WITHOUT CARDIOVERSION N/A 07/23/2021   Procedure: TRANSESOPHAGEAL ECHOCARDIOGRAM (TEE);  Surgeon: CBuford Dresser MD;  [facility];  Service: Cardiovascular;  Laterality: N/A;    Allergies: Metformin and related and Fluorescein  Medications: Prior to Admission medications   Medication Sig Start Date End Date Taking? Authorizing Provider  Accu-Chek Softclix Lancets lancets Check blood sugar 3 times per day 09/06/21   Masters, KJoellen Jersey DO  B-D UF III MINI PEN NEEDLES 31G X 5 MM MISC Use to inject insulin one time daily 09/06/21   Masters, KJoellen Jersey DO  Blood Glucose Monitoring Suppl (ACCU-CHEK GUIDE ME) w/Device KIT 1 kit by Does not apply route daily. 09/30/19   Kuppelweiser, Cassie L, RPH-CPP  Blood Glucose Monitoring Suppl (ACCU-CHEK NANO SMARTVIEW) w/Device KIT 1 each by Does not apply route 2 (two) times daily. Check blood sugar 4x a day before  meals and bedtime  2 days each week. Dx code- 250.00. 07/18/21   Demaio, Roxine Caddy, MD  carvedilol (COREG) 6.25 MG tablet Take 1 tablet (6.25 mg total) by mouth 2 (two) times daily. 09/27/21 09/27/22  Velna Ochs, MD  clopidogrel (PLAVIX) 75 MG tablet Take 1 tablet (75 mg total) by mouth  daily. 09/27/21   Velna Ochs, MD  DULoxetine (CYMBALTA) 20 MG capsule Take 1 capsule (20 mg total) by mouth 2 (two) times daily. 09/27/21   Velna Ochs, MD  Empagliflozin-metFORMIN HCl (SYNJARDY) 5-500 MG TABS Take 1 tablet by mouth 2 (two) times daily. 09/27/21   Velna Ochs, MD  glucose blood (ACCU-CHEK GUIDE) test strip Check blood sugar 3 times per day 09/06/21   Masters, Joellen Jersey, DO  insulin glargine (LANTUS SOLOSTAR) 100 UNIT/ML Solostar Pen Inject 30 Units into the skin daily. 09/06/21 03/25/22  Masters, Katie, DO  Lancets Misc. (ACCU-CHEK SOFTCLIX LANCET DEV) KIT Use to check blood sugar up to 3 times a day before meals. 09/06/21   Masters, Katie, DO  ODEFSEY 200-25-25 MG TABS tablet Take 1 tablet by mouth every morning. 09/06/21   [provider]  rosuvastatin (CRESTOR) 10 MG tablet Take 1 tablet (10 mg total) by mouth daily. 09/27/21   Velna Ochs, MD     Family History  Problem Relation Age of Onset   Diabetes Mother    Hypertension Mother    Heart disease Father    Arthritis Father    Diabetes Maternal Uncle     Social History   Socioeconomic History   Marital status: Legally Separated    Spouse name: Not on file   Number of children: Not on file   Years of education: Not on file   Highest education level: Not on file  Occupational History   Not on file  Tobacco Use   Smoking status: Never    Passive exposure: Current   Smokeless tobacco: Never  Vaping Use   Vaping Use: Never used  Substance and Sexual Activity   Alcohol use: Yes    Alcohol/week: 0.0 standard drinks of alcohol    Comment: occasional   Drug use: No    Comment: h/o cocaine abuse, clean since 2009   Sexual activity: Never    Partners: Male    Comment: pt. declined condoms  Other Topics Concern   Not on file  Social History Narrative   Not on file   Social Determinants of Health   Financial Resource Strain: High Risk (05/23/2021)   Overall Financial Resource Strain  (CARDIA)    Difficulty of Paying Living Expenses: Very hard  Food Insecurity: Food Insecurity Present (09/27/2021)   Hunger Vital Sign    Worried About Running Out of Food in the Last Year: Often true    Ran Out of Food in the Last Year: Often true  Transportation Needs: Unmet Transportation Needs (09/27/2021)   PRAPARE - Hydrologist (Medical): Yes    Lack of Transportation (Non-Medical): Yes  Physical Activity: Not on file  Stress: Not on file  Social Connections: Not on file    Review of Systems: A 12 point ROS discussed and pertinent positives are indicated in the HPI above.  All other systems are negative.  Review of Systems  Vital Signs: There were no vitals taken for this visit.  Physical Exam Constitutional:      Appearance: Normal appearance.  HENT:     Head: Normocephalic and atraumatic.  Eyes:  General: No scleral icterus. Cardiovascular:     Rate and Rhythm: Normal rate.  Pulmonary:     Effort: Pulmonary effort is normal.     Breath sounds: Normal breath sounds.  Abdominal:     General: Abdomen is flat.    Skin:    General: Skin is warm and dry.  Neurological:     Mental Status: She is alert and oriented to person, place, and time.  Psychiatric:        Mood and Affect: Mood normal.        Behavior: Behavior normal.       Imaging: IR Catheter Tube Change  Result Date: 09/14/2021 CLINICAL DATA:  History of laparoscopic appendectomy (procedure performed at [facility] by Dr. Cheri Fowler), complicated by development of postoperative abscess requiring percutaneous drainage catheter placement on 07/14/2021 with subsequent drainage catheter exchange and up sizing performed on 07/19/2021. Patient presents today to the interventional radiology department following abdominal CT with complaint inability to flush the percutaneous drainage catheter Preceding noncontrast abdominal CT demonstrates appropriate positioning of the  drainage catheter however there is a worsening abscess now involving the right posterolateral abdominal wall/flank. EXAM: IR CATHETER TUBE CHANGE COMPARISON:  CT abdomen pelvis - earlier same day; 08/29/2021; 08/09/2021; 07/18/2021; 07/09/2021; 07/04/2021 CT-guided percutaneous drainage catheter placement-07/04/2021 Fluoroscopic guided drainage catheter injection-08/29/2021; 08/09/2021 Fluoroscopic guided drainage catheter exchange and up sizing-07/20/2018 CONTRAST:  20 cc Isovue-300-administered via the percutaneous drainage catheter. MEDICATIONS: None. ANESTHESIA/SEDATION: None FLUOROSCOPY TIME:  2 minutes, 30 seconds (25 mGy) TECHNIQUE: Patient was positioned supine on the fluoroscopy table. The external portion of the existing percutaneous drainage catheter as well as the surrounding skin was prepped and draped in usual sterile fashion. A preprocedural spot fluoroscopic image was obtained of the existing percutaneous drainage catheter. A small amount of contrast was injected via the existing percutaneous drainage catheter and several fluoroscopic images were obtained in various obliquities. The external portion of the percutaneous drainage catheter was cut and cannulated with a short Amplatz wire. Under intermittent fluoroscopic guidance, the existing percutaneous drainage catheter was exchanged for a Kumpe catheter which was utilized to manipulate a regular glidewire into the right posterolateral abdominal wall/flank component of the abscess. Next, under intermittent fluoroscopic guidance, the Kumpe catheter was exchanged for a new slightly larger now 60 French percutaneous drainage catheter which was initially positioned within the right posterolateral body wall/flank abscess. At this point, all of the contrast was injected into the body wall abscess was aspirated and drainage catheter was retracted to a level equals distance between the persistent fistulous connection with the cecum as well as the body wall  abscess cavity. Contrast injection confirmed appropriate position functionality of the percutaneous drainage catheter. The percutaneous drainage catheter was connected to a gravity bag and secured in place within interrupted suture and a StatLock device. A dressing was applied. The patient tolerated the procedure well without immediate postprocedural complication. FINDINGS: Contrast injection demonstrates persistent fistulous connection between the intraperitoneal abscess cavity and the cecum as well as the right posterolateral abdominal wall/flank abscess. Fluoroscopic guided exchange, up sizing and repositioning, the new now 69 French drainage catheter is ideally positioned with communication between the cecal fistula connection as well as the right posterolateral abdominal wall/flank abscess cavity. IMPRESSION: Successful fluoroscopic guided percutaneous drainage catheter exchange repositioning and up sizing of now 14 French percutaneous drainage catheter. The drainage catheter is ideally positioned with communication between the cecal fistula as well as the right posterolateral abdominal wall/flank abscess cavity. PLAN: - The  drainage catheter was connected to a gravity bag. Given persistence of the fistula (high likelihood the fistula wall heal solely with percutaneous management) as well as intermittent catheter occlusion, the patient was instructed to flush drainage catheter with 10 cc saline twice per day. The patient was encouraged to maintain diligent records regarding daily drainage catheter output. - As the drainage catheter has been in place since 07/14/2021, above was discussed with the patient's surgeon, Dr. Cheri Fowler, who will reach out to the patient early next week for consideration definitive operative repair. - For the purposes of continuity of care, if patient needs more immediate attention, for the purposes of continuity of care, the patient was encouraged to presents to the [facility] Emergency  Department as this is where Dr. Cheri Fowler practices. Electronically Signed   By: Sandi Mariscal M.D.   On: 09/14/2021 13:18   CT ABDOMEN PELVIS WO CONTRAST  Result Date: 09/14/2021 CLINICAL DATA:  Appendiceal abscess? Possible drain removal EXAM: CT ABDOMEN AND PELVIS WITHOUT CONTRAST TECHNIQUE: Multidetector CT imaging of the abdomen and pelvis was performed following the standard protocol without IV contrast. RADIATION DOSE REDUCTION: This exam was performed according to the departmental dose-optimization program which includes automated exposure control, adjustment of the mA and/or kV according to patient size and/or use of iterative reconstruction technique. COMPARISON:  08/29/2021 FINDINGS: Lower chest: Trace pericardial effusion. Lung bases otherwise clear. Hepatobiliary: No focal liver abnormality is seen. No gallstones, gallbladder wall thickening, or biliary dilatation. Pancreas: Unremarkable. No pancreatic ductal dilatation or surrounding inflammatory changes. Spleen: Normal in size without focal abnormality. Adrenals/Urinary Tract: Adrenal glands are unremarkable. Kidneys are normal, without renal calculi, focal lesion, or hydronephrosis. Bladder is unremarkable. Stomach/Bowel: No bowel dilatation to indicate ileus or obstruction. Right lower quadrant abscess drain is again seen. Evaluation for residual abscess is limited due to lack of IV contrast. Right lower quadrant drain is unchanged in position. There appears to be greater inflammation and fluid collection located in the muscle wall/subcutaneous tissues just above the right iliac crest this fluid density region currently measures 4.3 x 2.3 x 3.8 cm compared to the prior examination from 08/29/2021 with this region measured approximately 4.7 x 2.7 x 2.2 cm. Vascular/Lymphatic: No enlarged abdominal or pelvic lymph nodes. Scattered minimal calcified atheromatous plaque of the abdominal aorta and visualized branches. Reproductive: Status post  hysterectomy. No adnexal masses. Other: No abdominal wall hernia or abnormality. No abdominopelvic ascites. Musculoskeletal: Mild bilateral hip osteoarthrosis. Widening of the facet joints bilaterally at L3-L4 with irregularity of the underlying osseous cortex has progressed when compared to older examination from 03/01/2021. IMPRESSION: 1. Right lower quadrant abscess drain is unchanged in position. Evaluation for abscess is limited due to lack of IV contrast. There does appear to be greater inflammation/abscess located posterior and lateral to the drain pigtail, within the posterolateral right muscle wall/subcutaneous tissues. The intra-abdominal portion of the abscess has resolved. 2. Progressive widening and cortical irregularity of the L3-L4 facet joints suspicious for inflammatory/infectious arthropathy. Further evaluation with contrast enhanced MRI of the lumbar spine would be beneficial. Electronically Signed   By: Miachel Roux M.D.   On: 09/14/2021 09:06    Labs:  CBC: Recent Labs    07/25/21 0419 07/26/21 0116 07/27/21 0306 08/24/21 1200 08/24/21 1242  WBC 5.8 6.0 5.6 8.1  --   HGB 9.0* 8.5* 9.0* 9.3* 10.2*  HCT 29.3* 27.9* 29.6* 30.8* 30.0*  PLT 464* 448* 488* 594*  --     COAGS: Recent Labs  07/20/21 0347  INR 1.1    BMP: Recent Labs    07/25/21 1552 07/26/21 0116 07/27/21 0306 08/23/21 1412 08/24/21 1200 08/24/21 1242  NA 137 136 137 131* 135 134*  K 4.9 4.3 4.3 5.0 4.4 4.6  CL 108 107 109 92* 99  --   CO2 20* 21* 23 19* 24  --   GLUCOSE 180* 139* 109* 716* 348*  --   BUN 18 19 22* 17 16  --   CALCIUM 9.2 8.9 8.7* 9.2 9.3  --   CREATININE 3.50* 3.39* 3.43* 1.16* 1.09*  --   GFRNONAA 15* 15* 15*  --  60*  --     LIVER FUNCTION TESTS: Recent Labs    07/23/21 0643 07/24/21 0323 07/25/21 0419 08/24/21 1200  BILITOT 0.5 0.2* 0.4 0.4  AST _0 12*  ALT _1 ALKPHOS 123 132* 112 85  PROT 6.9 6.9 6.8 7.3  ALBUMIN 2.6* 2.7* 2.7* 3.0*     TUMOR MARKERS: No results for input(s): "AFPTM", "CEA", "CA199", "CHROMGRNA" in the last 8760 hours.  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  The fistula appears slightly smaller compared to prior imaging from 09/14/2021.  This is quite encouraging.  Healing may be possible over a long enough timeframe.  Unfortunately, Ashley Vaughn has considerable social issues but continues to do her best to take care of the tube.  1.)  Maintain drain in place and flush twice daily. 2.) Gravity bag drainage 3.)  Seek out follow-up with her surgeon to discuss what surgical options would look like if healing does not occur with conservative management. 4.)  Return to IR clinic for repeat drain injection in 4 weeks.  Electronically Signed: Criselda Peaches 10/02/2021, 1:45 PM   I spent a total of  15 Minutes in face to face in clinical consultation, greater than 50% of which was counseling/coordinating care for percutaneous drain and cecal fistula.

## 2021-10-09 ENCOUNTER — Telehealth: Payer: Self-pay

## 2021-10-09 NOTE — Telephone Encounter (Signed)
   Telephone encounter was:  Successful.  10/09/2021 Name: [name] MRN: [id number] DOB: [birth year]  Ashley Vaughn is a 56 y.o. year old female who is a primary care patient of Marrianne Mood, MD . The community resource team was consulted for assistance with Food Insecurity and housing  Care guide performed the following interventions: Patient provided with information about care guide support team and interviewed to confirm resource needs.patient was following up about the mailed resources I sent her  Follow Up Plan:  No further follow up planned at this time. The patient has been provided with needed resources.    Lenard Forth Care Guide, Embedded Care Coordination [facility], Care Management  [phone] [address] [place], [place], [place] [address] Phone: [phone] Email: [email or web address]

## 2021-10-10 ENCOUNTER — Other Ambulatory Visit (HOSPITAL_COMMUNITY): Payer: Self-pay

## 2021-10-10 ENCOUNTER — Telehealth: Payer: Self-pay | Admitting: *Deleted

## 2021-10-10 NOTE — Telephone Encounter (Signed)
Call from [place], patient care coordinator,with [facility] stated they received [facility] referral. And they will see pt tomorrow.

## 2021-10-11 ENCOUNTER — Encounter: Payer: Medicaid Other | Admitting: Student

## 2021-10-23 ENCOUNTER — Other Ambulatory Visit: Payer: Medicaid Other

## 2021-10-25 ENCOUNTER — Telehealth: Payer: Self-pay | Admitting: *Deleted

## 2021-10-25 ENCOUNTER — Telehealth (HOSPITAL_COMMUNITY): Payer: Self-pay | Admitting: Vascular Surgery

## 2021-10-25 NOTE — Telephone Encounter (Signed)
Call from [place] from [facility].  Went out to visit patient.  Patient was unable to give information about needs.  Selena Batten stated that living conditions are poor. [place] as she was talking to patient.  Patient told her the the Clinics had her information and know what she needs. Given updates as to the needs of the patient.  Will attempt to go back out and see what can be done for the patient for [facility].

## 2021-10-25 NOTE — Telephone Encounter (Signed)
LVM TO MAKE NEW CHF APPT 9/12 / 9/14

## 2021-10-26 ENCOUNTER — Telehealth: Payer: Self-pay | Admitting: *Deleted

## 2021-10-26 NOTE — Telephone Encounter (Signed)
Call from patient stating that she did not get her pill pack prescriptions delivered to her home or her ex husband's address.  Call to [facility] prescriptions are there unable to process as there was no Insurance information given.  Insurance information was given today.  Will process medications for delivery to patient.  Patient was informed of the problem and that the insurance information was given and processed and to expect delivery of the pill packs.

## 2021-10-29 ENCOUNTER — Telehealth: Payer: Self-pay | Admitting: *Deleted

## 2021-10-29 NOTE — Telephone Encounter (Signed)
Received call from [place], OT with [facility] reporting patient does not want any further [facility] services.  States their SW called CPS as there is no running water and food insecurity so her grandson is no longer living with her.

## 2021-10-31 ENCOUNTER — Other Ambulatory Visit: Payer: Medicaid Other

## 2021-11-01 ENCOUNTER — Ambulatory Visit (HOSPITAL_COMMUNITY)
Admission: RE | Admit: 2021-11-01 | Discharge: 2021-11-01 | Disposition: A | Discharge status: Home / Self Care | Payer: Medicaid Other | Source: Ambulatory Visit | Attending: Cardiology | Admitting: Cardiology

## 2021-11-01 ENCOUNTER — Ambulatory Visit (INDEPENDENT_AMBULATORY_CARE_PROVIDER_SITE_OTHER): Payer: Medicaid Other | Admitting: Student

## 2021-11-01 ENCOUNTER — Encounter (HOSPITAL_COMMUNITY): Payer: Self-pay | Admitting: Cardiology

## 2021-11-01 ENCOUNTER — Other Ambulatory Visit (HOSPITAL_COMMUNITY): Payer: Self-pay

## 2021-11-01 ENCOUNTER — Other Ambulatory Visit: Payer: Self-pay

## 2021-11-01 ENCOUNTER — Telehealth (HOSPITAL_COMMUNITY): Payer: Self-pay | Admitting: Pharmacy Technician

## 2021-11-01 ENCOUNTER — Encounter: Payer: Self-pay | Admitting: Student

## 2021-11-01 VITALS — BP 124/80 | HR 109 | Wt 125.0 lb

## 2021-11-01 VITALS — BP 128/75 | HR 94 | Temp 97.5°F | Ht 62.0 in | Wt 124.5 lb

## 2021-11-01 DIAGNOSIS — Z79899 Other long term (current) drug therapy: Secondary | ICD-10-CM | POA: Insufficient documentation

## 2021-11-01 DIAGNOSIS — Z794 Long term (current) use of insulin: Secondary | ICD-10-CM | POA: Insufficient documentation

## 2021-11-01 DIAGNOSIS — Z21 Asymptomatic human immunodeficiency virus [HIV] infection status: Secondary | ICD-10-CM | POA: Insufficient documentation

## 2021-11-01 DIAGNOSIS — K651 Peritoneal abscess: Secondary | ICD-10-CM | POA: Diagnosis not present

## 2021-11-01 DIAGNOSIS — I5022 Chronic systolic (congestive) heart failure: Secondary | ICD-10-CM | POA: Diagnosis present

## 2021-11-01 DIAGNOSIS — I5020 Unspecified systolic (congestive) heart failure: Secondary | ICD-10-CM

## 2021-11-01 DIAGNOSIS — E1165 Type 2 diabetes mellitus with hyperglycemia: Secondary | ICD-10-CM | POA: Diagnosis not present

## 2021-11-01 DIAGNOSIS — E861 Hypovolemia: Secondary | ICD-10-CM | POA: Diagnosis not present

## 2021-11-01 DIAGNOSIS — E1140 Type 2 diabetes mellitus with diabetic neuropathy, unspecified: Secondary | ICD-10-CM | POA: Diagnosis not present

## 2021-11-01 DIAGNOSIS — Z7984 Long term (current) use of oral hypoglycemic drugs: Secondary | ICD-10-CM | POA: Diagnosis not present

## 2021-11-01 DIAGNOSIS — B20 Human immunodeficiency virus [HIV] disease: Secondary | ICD-10-CM | POA: Diagnosis not present

## 2021-11-01 DIAGNOSIS — I63411 Cerebral infarction due to embolism of right middle cerebral artery: Secondary | ICD-10-CM

## 2021-11-01 DIAGNOSIS — I10 Essential (primary) hypertension: Secondary | ICD-10-CM

## 2021-11-01 DIAGNOSIS — Z8673 Personal history of transient ischemic attack (TIA), and cerebral infarction without residual deficits: Secondary | ICD-10-CM | POA: Insufficient documentation

## 2021-11-01 DIAGNOSIS — T8143XA Infection following a procedure, organ and space surgical site, initial encounter: Secondary | ICD-10-CM | POA: Diagnosis not present

## 2021-11-01 DIAGNOSIS — Z87891 Personal history of nicotine dependence: Secondary | ICD-10-CM | POA: Diagnosis not present

## 2021-11-01 DIAGNOSIS — I110 Hypertensive heart disease with heart failure: Secondary | ICD-10-CM | POA: Insufficient documentation

## 2021-11-01 DIAGNOSIS — Z139 Encounter for screening, unspecified: Secondary | ICD-10-CM

## 2021-11-01 DIAGNOSIS — E118 Type 2 diabetes mellitus with unspecified complications: Secondary | ICD-10-CM

## 2021-11-01 LAB — GLUCOSE, CAPILLARY: Glucose-Capillary: 310 mg/dL — ABNORMAL HIGH (ref 70–99)

## 2021-11-01 MED ORDER — ODEFSEY 200-25-25 MG PO TABS: 1.0000 | ORAL_TABLET | Freq: Every morning | ORAL | 2 refills | Status: DC

## 2021-11-01 MED ORDER — ENTRESTO 24-26 MG PO TABS: 1.0000 | ORAL_TABLET | Freq: Two times a day (BID) | ORAL | 11 refills | Status: DC

## 2021-11-01 MED ORDER — METOPROLOL SUCCINATE ER 25 MG PO TB24: 25.0000 mg | ORAL_TABLET | Freq: Every day | ORAL | 3 refills | Status: DC

## 2021-11-01 MED ORDER — SPIRONOLACTONE 25 MG PO TABS: 12.5000 mg | ORAL_TABLET | Freq: Every day | ORAL | 3 refills | Status: DC

## 2021-11-01 MED ORDER — CARVEDILOL 6.25 MG PO TABS: 6.2500 mg | ORAL_TABLET | Freq: Two times a day (BID) | ORAL | 11 refills | Status: DC

## 2021-11-01 MED ORDER — ROSUVASTATIN CALCIUM 20 MG PO TABS: 20.0000 mg | ORAL_TABLET | Freq: Every day | ORAL | 3 refills | Status: DC

## 2021-11-01 MED ORDER — ACCU-CHEK GUIDE VI STRP: ORAL_STRIP | 3 refills | Status: DC

## 2021-11-01 NOTE — Assessment & Plan Note (Signed)
56 year old female with history of HFrEF, CVA, uncontrolled type 2 diabetes, and housing/food insecurity. Complains of not being able to work. Currently lives in her daughter's house with 8 children. At risk of becoming homeless soon.  She states living in shelter would be preferable to living at daughter's place. Currently seeking disability benefits with the help of a lawyer. Will see cardiology today for letter attesting to HFrEF for help securing disability.  Received prepackaged pill packs containing duloxetine, clopidogrel, rosuvastatin, and empagliflozin-metformin.  Enough for 30-day supply.  She is without her insulin, carvedilol, and her Odefsey.  I am going to refer her to our social workers, she has not spoken with them yet.  Perhaps they have some ideas regarding housing insecurity.  My hope is that she is able to qualify for disability because she is currently not eligible for housing assistance without any kind of income, per her report.

## 2021-11-01 NOTE — Assessment & Plan Note (Signed)
Patient reports improvement in this condition since she was last evaluated.  She drains perhaps 40 to 50 cc from the reservoir every morning.  She denies skin changes around the drain.  She denies fevers and abdominal pain.  Her bowel movements are somewhat irregular and loose, but without blood and she denies severe diarrhea or constipation.  As of today's evaluation this condition is improving.  She has a follow-up with interventional radiology soon for a repeat contrasted imaging study to evaluate the fistulous tract in between the abscess cavity in her cecum.  No indication for antibiotics at this time.

## 2021-11-01 NOTE — Progress Notes (Signed)
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 is a 56 y.o. female with a past medical history stated below and presents today for reasons above. Please see problem based assessment and plan for additional details.  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   Seizure ([place] 07/18/2021    Current Outpatient Medications on File Prior to Visit  Medication Sig Dispense Refill   clopidogrel (PLAVIX) 75 MG tablet Take 1 tablet (75 mg total) by mouth daily. 30 tablet 11   DULoxetine (CYMBALTA) 20 MG capsule Take 1 capsule (20 mg total) by mouth 2 (two) times daily. 60 capsule 11   Empagliflozin-metFORMIN HCl (SYNJARDY) 5-500 MG TABS Take 1 tablet by mouth 2 (two) times daily. 60 tablet 11   rosuvastatin (CRESTOR) 10 MG tablet Take 1 tablet (10 mg total) by mouth daily. 30 tablet 11   Accu-Chek Softclix Lancets lancets Check blood sugar 3 times per day 270 each 3   B-D UF III MINI PEN NEEDLES 31G X 5 MM MISC Use to inject insulin one time daily 100 each 3   Blood Glucose Monitoring Suppl (ACCU-CHEK GUIDE ME) w/Device KIT 1 kit by Does not apply route daily. 1 kit 0   Blood Glucose Monitoring Suppl (ACCU-CHEK NANO SMARTVIEW) w/Device KIT 1 each by Does not apply route 2 (two) times daily. Check blood sugar 4x a day before meals and bedtime  2 days each  week. Dx code- 250.00. 1 kit 0   insulin glargine (LANTUS SOLOSTAR) 100 UNIT/ML Solostar Pen Inject 30 Units into the skin daily. 15 mL 3   Lancets Misc. (ACCU-CHEK SOFTCLIX LANCET DEV) KIT Use to check blood sugar up to 3 times a day before meals. 1 kit 1   Sodium Chloride Flush (NORMAL SALINE FLUSH) 0.9 % SOLN Flush 2 times a day as directed 750 mL 1   No current facility-administered medications on file prior to visit.    Family History  Problem Relation Age of Onset   Diabetes Mother    Hypertension Mother    Heart disease Father    Arthritis Father    Diabetes Maternal Uncle     Social History   Socioeconomic History   Marital status: Legally Separated    Spouse name: Not on file   Number of children: Not on file   Years of education: Not on file   Highest education level: Not on file  Occupational History   Not on file  Tobacco Use   Smoking status: Never    Passive exposure: Current   Smokeless tobacco: Never  Vaping Use   Vaping Use: Never used  Substance and Sexual Activity   Alcohol use: Yes    Alcohol/week: 0.0 standard drinks of alcohol    Comment: occasional   Drug use: No  Comment: h/o cocaine abuse, clean since 2009   Sexual activity: Never    Partners: Male    Comment: pt. declined condoms  Other Topics Concern   Not on file  Social History Narrative   Not on file   Social Determinants of Health   Financial Resource Strain: High Risk (05/23/2021)   Overall Financial Resource Strain (CARDIA)    Difficulty of Paying Living Expenses: Very hard  Food Insecurity: Food Insecurity Present (09/27/2021)   Hunger Vital Sign    Worried About Running Out of Food in the Last Year: Often true    Ran Out of Food in the Last Year: Often true  Transportation Needs: Unmet Transportation Needs (09/27/2021)   PRAPARE - Hydrologist (Medical): Yes    Lack of Transportation (Non-Medical): Yes  Physical Activity: Not on file  Stress: Not  on file  Social Connections: Not on file  Intimate Partner Violence: Not on file    Review of Systems: ROS negative except for what is noted on the assessment and plan.  Objective:   Vitals:   11/01/21 0917  BP: 128/75  Pulse: 94  Temp: (!) 97.5 F (36.4 C)  TempSrc: Oral  SpO2: 100%  Weight: 124 lb 8 oz (56.5 kg)  Height: _0  (1.575 m)    Physical Exam: General: Alert female, nontoxic appearing. Cardiovascular: Regular rate and rhythm.  Radial pulses 2+ bilaterally Pulmonary: Normal work of breathing.  Lungs clear to auscultation. Abdominal: Patent drain right lower quadrant with scant dark-colored drainage in the bag.  No tenderness or erythema.  Abdomen soft and nondistended. Skin: Warm and dry. Neurologic: Alert and oriented. Psychiatric: Pleasant.  Mood is depressed with congruent affect.  Assessment & Plan:  Encounter for screening involving social determinants of health (SDoH) 56 year old female with history of HFrEF, CVA, uncontrolled type 2 diabetes, and housing/food insecurity. Complains of not being able to work. Currently lives in her daughter's house with 8 children. At risk of becoming homeless soon.  She states living in shelter would be preferable to living at daughter's place. Currently seeking disability benefits with the help of a lawyer. Will see cardiology today for letter attesting to HFrEF for help securing disability.  Received prepackaged pill packs containing duloxetine, clopidogrel, rosuvastatin, and empagliflozin-metformin.  Enough for 30-day supply.  She is without her insulin, carvedilol, and her Odefsey.  I am going to refer her to our social workers, she has not spoken with them yet.  Perhaps they have some ideas regarding housing insecurity.  My hope is that she is able to qualify for disability because she is currently not eligible for housing assistance without any kind of income, per her report.  HFrEF (heart failure with reduced ejection  fraction) (HCC) Symptoms are currently well controlled.  She has some mild shortness of breath with exertion but this is not new or unusual for her.  On exam she appears euvolemic.  HFrEF is stable at the time of this visit.  Because she is without her carvedilol, and she reports that it is an affordable medicine to pick up from the pharmacy, I will refill this today. - Continue empagliflozin-metformin 5-500 mg twice daily - Continue carvedilol 6.25 mg twice daily  Poorly controlled type 2 diabetes mellitus with neuropathy (HCC) Currently on empagliflozin-metformin.  Does not have any insulin and has been without it for some time.  Does not have test strips to go with her meter at home.  She was told  by her pharmacy that there is a 21-day waiting period before she can get new test strips.  Last hemoglobin A1c was greater than 14.  I will check a capillary blood glucose today.  I also refilled her test strips.  I would like her to follow-up in a week, at that point I would like to get her back on her insulin regimen.  Human immunodeficiency virus (HIV) disease ([place]) In July her HIV RNA was undetectable and her CD4 count was around 1400.  She reports intermittent adherence with her antiretrovirals.  She has been without them for about a week now.  I will refill these for her today.  Postprocedural intraabdominal abscess Patient reports improvement in this condition since she was last evaluated.  She drains perhaps 40 to 50 cc from the reservoir every morning.  She denies skin changes around the drain.  She denies fevers and abdominal pain.  Her bowel movements are somewhat irregular and loose, but without blood and she denies severe diarrhea or constipation.  As of today's evaluation this condition is improving.  She has a follow-up with interventional radiology soon for a repeat contrasted imaging study to evaluate the fistulous tract in between the abscess cavity in her cecum.  No indication for  antibiotics at this time.   Patient seen with Dr. Loree Fee, M.D. [facility]  PGY-1 Pager: [phone] Date 11/01/2021  Time 1:30 PM

## 2021-11-01 NOTE — Assessment & Plan Note (Addendum)
Currently on empagliflozin-metformin.  Does not have any insulin and has been without it for some time.  Does not have test strips to go with her meter at home.  She was told by her pharmacy that there is a 21-day waiting period before she can get new test strips.  Last hemoglobin A1c was greater than 14.  I will check a capillary blood glucose today.  I also refilled her test strips.  I would like her to follow-up in a week, at that point I would like to get her back on her insulin regimen.

## 2021-11-01 NOTE — Progress Notes (Signed)
ADVANCED HEART FAILURE CLINIC NOTE  Referring Physician: Dr. Marrianne Mood Primary Care: Dr. Benito Mccreedy Primary Cardiologist: N/A  HPI: Ashley Vaughn is a 56 y.o. female with uncontrolled T2DM, well controlled HIV (last viral load undetectable), stroke (6/23), recently diagnosed HFrEF, HTN and intra-abdominal abscess with IR drain in place presenting today to establish care.   According to Ashley Vaughn she has very limited resources at home. She currently lives with her daughter. Earlier this year she had an appendectomy that was complicated by a postoperative abscess requiring a percutaneous drainage catheter on 07/14/21. 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 In late May 2023, she was admitted to [facility] after having a seizure-like episode with motor/sensory deficits. MRI confirmed with right MCA/ACA infarcts. TTE at that time also confirmed newly diagnosed HFrEF w/ LVEF 30%-35%. She was unable to start much GDMT or undergo ischemic evaluation at the time due to an AKI.   Since her discharge from the hospital, Ashley Vaughn reports compliance with medications. No recent tobacco/ETOH/drug use reported. She continues to have at least 40cc of drainage from her abdominal drain but no systemic signs of infection (fever/chills/etc). She denies exertional dyspnea, chest pain or diaphoresis.   Activity level/exercise tolerance:  NYHA IIB Orthopnea:  Sleeps on 2 pillows Paroxysmal noctural dyspnea:  No Chest pain/pressure:  No Orthostatic lightheadedness:  No Palpitations:  Minimal Lower extremity edema:  No Presyncope/syncope:  No Cough:  No   Past Medical History:  Diagnosis Date   Cardiomyopathy (HCC)    Depression    Diabetes mellitus    Fatigue 12/21/2020   History of positive PPD, treatment status unknown    HIV infection (HCC)    HSV (herpes simplex virus) anogenital infection 03/12/2019   POSITIVE  PPD 10/13/2008   Annotation: 07-02-02 positive 14X42mm  chest xray 06-25-05 negative cxr per [facility]   Qualifier: Diagnosis of  By: Brooke Dare RN, Tammy     Postoperative infection 07/18/2021   Patient had appendectomy early in May, followed by a hospitalization and drain placement for an intrabdominal infection and abscess.   Seizure (HCC) 07/18/2021    Current Outpatient Medications  Medication Sig Dispense Refill   Accu-Chek Softclix Lancets lancets Check blood sugar 3 times per day 270 each 3   B-D UF III MINI PEN NEEDLES 31G X 5 MM MISC Use to inject insulin one time daily 100 each 3   Blood Glucose Monitoring Suppl (ACCU-CHEK GUIDE ME) w/Device KIT 1 kit by Does not apply route daily. 1 kit 0   Blood Glucose Monitoring Suppl (ACCU-CHEK NANO SMARTVIEW) w/Device KIT 1 each by Does not apply route 2 (two) times daily. Check blood sugar 4x a day before meals and bedtime  2 days each week. Dx code- 250.00. 1 kit 0   clopidogrel (PLAVIX) 75 MG tablet Take 1 tablet (75 mg total) by mouth daily. 30 tablet 11   DULoxetine (CYMBALTA) 20 MG capsule Take 1 capsule (20 mg total) by mouth 2 (two) times daily. 60 capsule 11   Empagliflozin-metFORMIN HCl (SYNJARDY) 5-500 MG TABS Take 1 tablet by mouth 2 (two) times daily. 60 tablet 11   glucose blood (ACCU-CHEK GUIDE) test strip Check blood sugar 3 times per day 270 each 3   insulin glargine (LANTUS SOLOSTAR) 100 UNIT/ML Solostar Pen Inject 30 Units into the skin daily. 15 mL 3   Lancets Misc. (ACCU-CHEK SOFTCLIX LANCET DEV) KIT Use to  check blood sugar up to 3 times a day before meals. 1 kit 1   ODEFSEY 200-25-25 MG TABS tablet Take 1 tablet by mouth every morning. 60 tablet 2   rosuvastatin (CRESTOR) 10 MG tablet Take 1 tablet (10 mg total) by mouth daily. 30 tablet 11   Sodium Chloride Flush (NORMAL SALINE FLUSH) 0.9 % SOLN Flush 2 times a day as directed 750 mL 1   carvedilol (COREG) 6.25 MG tablet Take 1 tablet (6.25 mg total) by mouth 2 (two) times daily. (Patient  not taking: Reported on 11/01/2021) 60 tablet 11   No current facility-administered medications for this encounter.    Allergies  Allergen Reactions   Metformin And Related    Fluorescein Itching and Rash    INTRAVENOUSLY      Social History   Socioeconomic History   Marital status: Legally Separated    Spouse name: Not on file   Number of children: Not on file   Years of education: Not on file   Highest education level: Not on file  Occupational History   Not on file  Tobacco Use   Smoking status: Never    Passive exposure: Current   Smokeless tobacco: Never  Vaping Use   Vaping Use: Never used  Substance and Sexual Activity   Alcohol use: Yes    Alcohol/week: 0.0 standard drinks of alcohol    Comment: occasional   Drug use: No    Comment: h/o cocaine abuse, clean since 2009   Sexual activity: Never    Partners: Male    Comment: pt. declined condoms  Other Topics Concern   Not on file  Social History Narrative   Not on file   Social Determinants of Health   Financial Resource Strain: High Risk (05/23/2021)   Overall Financial Resource Strain (CARDIA)    Difficulty of Paying Living Expenses: Very hard  Food Insecurity: Food Insecurity Present (09/27/2021)   Hunger Vital Sign    Worried About Charity fundraiser in the Last Year: Often true    Ran Out of Food in the Last Year: Often true  Transportation Needs: Unmet Transportation Needs (09/27/2021)   PRAPARE - Hydrologist (Medical): Yes    Lack of Transportation (Non-Medical): Yes  Physical Activity: Not on file  Stress: Not on file  Social Connections: Not on file  Intimate Partner Violence: Not on file      Family History  Problem Relation Age of Onset   Diabetes Mother    Hypertension Mother    Heart disease Father    Arthritis Father    Diabetes Maternal Uncle     There were no vitals filed for this visit.  PHYSICAL EXAM: GENERAL: thin AAF in NAD. NECK: Supple, No  masses. Normal carotid upstrokes without bruits. No masses or thyromegaly.    CHEST: There are no chest wall deformities. There is no chest wall tenderness. Respirations are unlabored.  Lungs- CTA B/L; no inspiratory crackles or rales CARDIAC:  JVP: 8cm H2O         NS1S2 Normal rate with regular rhythm. No murmurs, rubs or gallops.  Pulses are 2+ and symmetrical in upper and lower extremities. No edema.  ABDOMEN: soft; mild RLQ tenderness to palpation; drain w/ bandages on abdomen; drainage noted on bandages EXTREMITIES: Warm and well perfused with no cyanosis, clubbing.  LYMPHATIC: No axillary or supraclavicular lymphadenopathy.  NEUROLOGIC: Patient is oriented x3 with no focal or lateralizing neurologic deficits.  PSYCH:  Patients affect is appropriate, there is no evidence of anxiety or depression.  SKIN: Warm and dry; no lesions or wounds.   DATA REVIEW  ECG: NSR as per my read  ECHO: 07/21/21: LVEF 30-35%,  07/23/21 (TEE): LVEF 30-35%; normal RV function, small right to left shunt.  ASSESSMENT & PLAN:  NYHA IIB, Heart failure with reduced ejection fraction Etiology of HF: Although she doesn't complain of typical angina, considerable vascular disease (CVA, carotid stenosis, grade II aortic atheroma on TEE) and uncontrolled T2DM are concerning for multivessel CAD. Patient is averse to invasive procedures. Will pursue coronary CTA.  NYHA class / AHA Stage: II Volume status & Diuretics: Euvolemic - hypovolemic Vasodilators: Start entresto 24/26mg  BID Beta-Blocker: D/C coreg; start toprol XL 25mg  daily MRA: start spironolactone 12.5mg  daily Cardiometabolic: jardiance 25mg  daily Devices therapies & Valvulopathies: No valvular disease; will continue GDMT before consideration for primary prevention ICD Advanced therapies: Not a candidate at this time.  2. CVA - MRI w/ right MCA/ACA infarcts - CTA head  3. Uncontrolled T2DM - Following with internal medicine - Jardiance 25mg  daily  -  Continue insulin - We discussed importance of controlling her diabetes at length today and signs/symptoms of ketoacidosis, potential side effects of SGLT2i.  - A1C>14  4. HIV - Well controlled CD4; undetectable viral load  5. Intra-abdominal abscess Repeat CT abdomen on 8/15 demonstrated improvement in previously noted abscess.   6. Tobacco use - Stopped smoking in 2008  Follow-up:  6 weeks  Shelvy Heckert [facility] Transplant Cardiology

## 2021-11-01 NOTE — Assessment & Plan Note (Signed)
In July her HIV RNA was undetectable and her CD4 count was around 1400.  She reports intermittent adherence with her antiretrovirals.  She has been without them for about a week now.  I will refill these for her today.

## 2021-11-01 NOTE — Assessment & Plan Note (Signed)
Symptoms are currently well controlled.  She has some mild shortness of breath with exertion but this is not new or unusual for her.  On exam she appears euvolemic.  HFrEF is stable at the time of this visit.  Because she is without her carvedilol, and she reports that it is an affordable medicine to pick up from the pharmacy, I will refill this today. - Continue empagliflozin-metformin 5-500 mg twice daily - Continue carvedilol 6.25 mg twice daily

## 2021-11-01 NOTE — Patient Instructions (Signed)
Today we discussed your housing situation, your abdominal infection, and diabetes.  Please pick up your Odefsey and carvedilol (Coreg) at [facility].  Please follow up with [place] in one week.    Labs and tests ordered today:   Lab Orders         BMP8+Anion Gap         POCT CBG (Fasting - Glucose)      Referrals ordered today:    Referral Orders         Ambulatory referral to Social Work      Take your medications as prescribed:   Allergies as of 11/01/2021       Reactions   Metformin And Related    Fluorescein Itching, Rash   INTRAVENOUSLY        Medication List        Accurate as of November 01, 2021 10:09 AM. If you have any questions, ask your nurse or doctor.          Accu-Chek Guide Me w/Device Kit 1 kit by Does not apply route daily.   Accu-Chek Nano SmartView w/Device Kit 1 each by Does not apply route 2 (two) times daily. Check blood sugar 4x a day before meals and bedtime  2 days each week. Dx code- 250.00.   Accu-Chek Guide test strip Generic drug: glucose blood Check blood sugar 3 times per day   Accu-Chek Softclix Lancet Dev Kit Use to check blood sugar up to 3 times a day before meals.   Accu-Chek Softclix Lancets lancets Check blood sugar 3 times per day   B-D UF III MINI PEN NEEDLES 31G X 5 MM Misc Generic drug: Insulin Pen Needle Use to inject insulin one time daily   carvedilol 6.25 MG tablet Commonly known as: Coreg Take 1 tablet (6.25 mg total) by mouth 2 (two) times daily.   clopidogrel 75 MG tablet Commonly known as: PLAVIX Take 1 tablet (75 mg total) by mouth daily.   DULoxetine 20 MG capsule Commonly known as: CYMBALTA Take 1 capsule (20 mg total) by mouth 2 (two) times daily.   Lantus SoloStar 100 UNIT/ML Solostar Pen Generic drug: insulin glargine Inject 30 Units into the skin daily.   Normal Saline Flush 0.9 % Soln Flush 2 times a day as directed   Odefsey 200-25-25 MG Tabs tablet Generic drug:  emtricitabine-rilpivir-tenofovir AF Take 1 tablet by mouth every morning.   rosuvastatin 10 MG tablet Commonly known as: CRESTOR Take 1 tablet (10 mg total) by mouth daily.   Synjardy 5-500 MG Tabs Generic drug: Empagliflozin-metFORMIN HCl Take 1 tablet by mouth 2 (two) times daily.        Follow up: 1 week.  Please call our clinic at [phone] Monday through Friday from 9 am to 4 pm if you have any questions or concerns. If after hours or on the weekend, call the main hospital number and ask for the Internal Medicine Resident On-Call. If you need medication refills, please notify your pharmacy one week in advance and they will send [place] a request.   Best, Nani Gasser, [place]

## 2021-11-01 NOTE — Telephone Encounter (Signed)
Advanced Heart Failure Patient Advocate Encounter  Prior Authorization for Sherryll Burger has been submitted and approved on [employer].  PA# [id number] Effective dates: 11/01/21 through 10/31/22  Patients co-pay is $4 (insurance allows 30 day billing).  Archer Asa, CPhT

## 2021-11-01 NOTE — Progress Notes (Signed)
Heart and Vascular Care Navigation  11/01/2021  Ashley Vaughn Dec 17, 1965 [id number]  Reason for Referral: SDOH concerns   Engaged with patient face to face for initial visit for Heart and Vascular Care Coordination.                                                                                                   Assessment:  CSW met with pt to discuss SDOH concerns.  Reports she was living in [facility] with several grandchildren who were in her custody.   She has recently lost custody of the children due to the condition of that home- states she stopped being able to take care of it when she became ill.  She has left that apartment due to being unable to get up and down the stairs- encouraged her to work with them to avoid eviction as this would affect future housing opportunities- she states she has tried to get her lease broken because of health concerns but it didn't work.  She only gets $297 a month from when she was caring for her grandchildren but will lose that now that they are out of her household.    Has moved in with her dtr now and her 8 children- states this is very chaotic and wants her own place.  CSW explained without income this would be very difficult.  She states she has an application in with housing authority for senior housing but hasn't heard anything yet.  Working on disability- was just denied last week it sounds like but now in appeal with help of a Chief Executive Officer.  Encouraged her to let us know how we can help to speed up this process.  Pt reports issues affording basic needs like clothing.  Able to provide with pair of shoes from donations to the clinic and referral to [employer] to go shopping for clothing there.                                    HRT/VAS Care Coordination     Living arrangements for the past 2 months Apartment   Lives with: Self   Home Assistive Devices/Equipment None   [place]       Social History:                                                                              SDOH Screenings   Food Insecurity: Food Insecurity Present (09/27/2021)  Housing: Medium Risk (11/01/2021)  Transportation Needs: Unmet Transportation Needs (09/27/2021)  Depression (PHQ2-9): High Risk (11/01/2021)  Financial Resource Strain: High Risk (11/01/2021)  Tobacco Use: Medium Risk (11/01/2021)    SDOH Interventions: Financial Resources:  Financial Strain Interventions: Other (Comment) (disability  assistance, bobs closet referral, provided shoes from clothing stores) Social Security for Disability application assistance  Food Insecurity:   House gets food stamps  Housing Insecurity:  Housing Interventions: Other (Comment) (shelter referral)  Transportation:    Usually can get a ride from her dtr   Follow-up plan:    Unfortunately patient with a large amount of social barriers.  Patient will continue to work with clinic and we will assist as best we can in getting her a source of income so she can work towards affordable and safe housing.  Jorge Ny, LCSW Clinical Social Worker [facility] Clinic Desk#: [phone] Cell#: [phone]

## 2021-11-01 NOTE — Patient Instructions (Signed)
STOP Carvedilol  START Toprol XL 25 mg daily  START spironolactone 12.5mg  (1/2 Tab) daily.  START Entresto 24/26 Twice daily  INCREASE Crestor to 20mg  daily  Your physician has requested that you have cardiac CT. Cardiac computed tomography (CT) is a painless test that uses an x-ray machine to take clear, detailed pictures of your heart. For further information please visit . Please follow instruction sheet as given.  ONCE APPROVED BY YOUR INSURANCE COMPANY YOU WILL BE CALLED TO HAVE THE TEST ARRANGED.  Please follow up with our heart failure pharmacist in 2 weeks  Your physician recommends that you schedule a follow-up appointment in: 6 weeks  If you have any questions or concerns before your next appointment please send [email or web address] a message through [place] or call our office at [phone].    TO LEAVE A MESSAGE FOR THE NURSE SELECT OPTION 2, PLEASE LEAVE A MESSAGE INCLUDING: YOUR NAME DATE OF BIRTH CALL BACK NUMBER REASON FOR CALL**this is important as we prioritize the call backs  YOU WILL RECEIVE A CALL BACK THE SAME DAY AS LONG AS YOU CALL BEFORE 4:00 PM  At the [facility], you and your health needs are our priority. As part of our continuing mission to provide you with exceptional heart care, we have created designated Provider Care Teams. These Care Teams include your primary Cardiologist (physician) and Advanced Practice Providers (APPs- Physician Assistants and Nurse Practitioners) who all work together to provide you with the care you need, when you need it.   You may see any of the following providers on your designated Care Team at your next follow up: Dr [phone] Dr Arvilla Meres Dr. Marca Ancona, NP Marcos Eke, Robbie Lis [facility], [place], NP Brynda Peon, PharmD   Please be sure to bring in all your medications bottles to every appointment.

## 2021-11-02 LAB — BMP8+ANION GAP
Anion Gap: 17 mmol/L (ref 10.0–18.0)
BUN/Creatinine Ratio: 15 (ref 9–23)
BUN: 13 mg/dL (ref 6–24)
CO2: 21 mmol/L (ref 20–29)
Calcium: 9.6 mg/dL (ref 8.7–10.2)
Chloride: 99 mmol/L (ref 96–106)
Creatinine, Ser: 0.87 mg/dL (ref 0.57–1.00)
Glucose: 318 mg/dL — ABNORMAL HIGH (ref 70–99)
Potassium: 4.2 mmol/L (ref 3.5–5.2)
Sodium: 137 mmol/L (ref 134–144)
eGFR: 79 mL/min/1.73

## 2021-11-05 NOTE — Progress Notes (Signed)
Internal Medicine Clinic Attending  I saw and evaluated the patient.  I personally confirmed the key portions of the history and exam documented by Dr. McLendon and I reviewed pertinent patient test results.  The assessment, diagnosis, and plan were formulated together and I agree with the documentation in the resident's note.  

## 2021-11-12 ENCOUNTER — Inpatient Hospital Stay (HOSPITAL_COMMUNITY): Admission: RE | Admit: 2021-11-12 | Payer: Medicaid Other | Source: Ambulatory Visit

## 2021-11-20 ENCOUNTER — Encounter (HOSPITAL_COMMUNITY): Payer: Self-pay

## 2021-11-20 ENCOUNTER — Ambulatory Visit (HOSPITAL_COMMUNITY)
Admission: RE | Admit: 2021-11-20 | Discharge: 2021-11-20 | Disposition: A | Discharge status: Home / Self Care | Payer: Medicaid Other | Source: Ambulatory Visit | Attending: Cardiology | Admitting: Cardiology

## 2021-11-20 VITALS — BP 126/82 | HR 105 | Wt 125.2 lb

## 2021-11-20 DIAGNOSIS — Z8673 Personal history of transient ischemic attack (TIA), and cerebral infarction without residual deficits: Secondary | ICD-10-CM | POA: Diagnosis not present

## 2021-11-20 DIAGNOSIS — Z7984 Long term (current) use of oral hypoglycemic drugs: Secondary | ICD-10-CM | POA: Diagnosis not present

## 2021-11-20 DIAGNOSIS — I110 Hypertensive heart disease with heart failure: Secondary | ICD-10-CM | POA: Diagnosis present

## 2021-11-20 DIAGNOSIS — I6529 Occlusion and stenosis of unspecified carotid artery: Secondary | ICD-10-CM | POA: Diagnosis not present

## 2021-11-20 DIAGNOSIS — E119 Type 2 diabetes mellitus without complications: Secondary | ICD-10-CM | POA: Diagnosis not present

## 2021-11-20 DIAGNOSIS — Z87891 Personal history of nicotine dependence: Secondary | ICD-10-CM | POA: Insufficient documentation

## 2021-11-20 DIAGNOSIS — I5020 Unspecified systolic (congestive) heart failure: Secondary | ICD-10-CM | POA: Diagnosis not present

## 2021-11-20 DIAGNOSIS — Z79899 Other long term (current) drug therapy: Secondary | ICD-10-CM | POA: Diagnosis not present

## 2021-11-20 DIAGNOSIS — I5022 Chronic systolic (congestive) heart failure: Secondary | ICD-10-CM

## 2021-11-20 DIAGNOSIS — B20 Human immunodeficiency virus [HIV] disease: Secondary | ICD-10-CM | POA: Diagnosis not present

## 2021-11-20 MED ORDER — ENTRESTO 24-26 MG PO TABS: 1.0000 | ORAL_TABLET | Freq: Two times a day (BID) | ORAL | 11 refills | Status: DC

## 2021-11-20 MED ORDER — SPIRONOLACTONE 25 MG PO TABS: 12.5000 mg | ORAL_TABLET | Freq: Every day | ORAL | 3 refills | Status: DC

## 2021-11-20 NOTE — Progress Notes (Signed)
H&V Care Navigation CSW Progress Note  Clinical Social Worker consulted by pharmacy to help pt get back home.  Took bus here and took several hours- CSW able to help get ride back home  [place] Present (09/27/2021)  Housing: Medium Risk (11/01/2021)  Transportation Needs: Unmet Transportation Needs (11/20/2021)  Depression (PHQ2-9): High Risk (11/01/2021)  Financial Resource Strain: High Risk (11/01/2021)  Tobacco Use: Medium Risk (11/20/2021)   Jorge Ny, [place] [facility] Desk#: [phone] Cell#: [phone]

## 2021-11-20 NOTE — Patient Instructions (Addendum)
It was a pleasure seeing you today!  MEDICATIONS: -We are changing your medications today -Start spironolactone 12.5 mg daily.  -Start Entresto 24/26 mg twice daily.  -Call if you have questions about your medications.  NEXT APPOINTMENT: Return to clinic on 12/13/2021.   In general, to take care of your heart failure: -Limit your fluid intake to 2 Liters (half-gallon) per day.   -Limit your salt intake to ideally 2-3 grams (2000-3000 mg) per day. -Weigh yourself daily and record, and bring that "weight diary" to your next appointment.  (Weight gain of 2-3 pounds in 1 day typically means fluid weight.) -The medications for your heart are to help your heart and help you live longer.   -Please contact us before stopping any of your heart medications.  Call the clinic at [phone] with questions or to reschedule future appointments.

## 2021-11-20 NOTE — Progress Notes (Signed)
Advanced Heart Failure Clinic Note   Referring Physician: Dr. Nani Gasser Primary Care: Dr. Carin Primrose Primary Cardiologist: N/A HF Cardiologist: Dr. Daniel Nones   HPI:  Ashley Vaughn is a 56 y.o. female with uncontrolled T2DM, well controlled HIV (last viral load undetectable), stroke (07/2021), recently diagnosed HFrEF, HTN and intra-abdominal abscess with IR drain in place.    Earlier this year she had an appendectomy that was complicated by a postoperative abscess requiring a percutaneous drainage catheter on 07/14/2021. 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 In late May 2023, she was admitted to [facility] after having a seizure-like episode with motor/sensory deficits. MRI confirmed with right MCA/ACA infarcts. TTE at that time also confirmed newly diagnosed HFrEF w/ LVEF 30%-35%. She was unable to start much GDMT or undergo ischemic evaluation at the time due to an AKI.    Established care with AHF clinic on 11/01/2021. Endorsed she had very limited resources at home. She reported compliance with medications. Denied recent tobacco/ETOH/drug use. She continued to have at least 40cc of drainage from her abdominal drain but no systemic signs of infection. She denied exertional dyspnea, chest pain or diaphoresis. Sleeps on 2 pillows, no PND.  Today she returns to HF clinic for pharmacist medication titration. At last visit with MD, multiple medication changes were made. Entresto 24/26 mg BID and spironolactone 12.5 mg daily were initiated. Additionally, carvedilol was stopped and metoprolol succinate 25 mg daily was started. However, prescriptions were sent to [facility] instead of [facility] (who does her pill packs) and she has NOT started these medications. She continues to take carvedilol as this was in her pill pack from [employer]. Overall feeling sleepy. Denies dizziness, lightheadedness,  chest pain or palpitations. Breathing unchanged since last visit. Has not been checking her weight at home due to housing insecurity and she does not have a scale. Not on daily diuretic and weight stable compared to last visit. No LEE. Denies PND/orthopnea.  HF Medications: Carvedilol 6.25 mg BID Jardiance 10 mg daily (as a part of Synjardy 5-500 mg BID)  Has the patient been experiencing any side effects to the medications prescribed?  NO  Does the patient have any problems obtaining medications due to transportation or finances? Yes- transportation issues; [place] Medicaid  Understanding of regimen: good Understanding of indications: good Potential of compliance: good Patient understands to avoid NSAIDs. Patient understands to avoid decongestants.    Pertinent Lab Values: (11/01/2021) Serum creatinine 0.87, BUN 15, Potassium 4.2, Sodium 137  Vital Signs: Weight: 125.2 lbs (last clinic weight: 125 lbs) Blood pressure: 126/82 mmHg  Heart rate: 105 bpm   Assessment/Plan: NYHA IIB, Heart failure with reduced ejection fraction Etiology of HF: Although she doesn't complain of typical angina, considerable vascular disease (CVA, carotid stenosis, grade II aortic atheroma on TEE) and uncontrolled T2DM are concerning for multivessel CAD. Patient is averse to invasive procedures. Will pursue coronary CTA, scheduled for 11/22/21.  NYHA class / AHA Stage: II Volume status & Diuretics: Euvolemic on exam. Provided patient a scale today.  Vasodilators: Start Entresto 24/26 mg BID. BMET to be collected at [place] visit on 12/05/2021. Beta-Blocker: Continue carvedilol 6.25 mg BID. Discontinue metoprolol succinate as patient never started metoprolol due to carvedilol being in her pill pack. Will limit amount of medication changes at one time as patient has struggled with multiple medication changes at once.  MRA: Start spironolactone 12.5 mg  daily. BMET to be collected at [place] visit on  12/05/2021. Cardiometabolic: Continue Jardiance 10 mg daily (as a part of Synjardy 5-500 mg BID) Devices therapies & Valvulopathies: No valvular disease; will continue GDMT before consideration for primary prevention ICD Advanced therapies: Not a candidate at this time.   2. CVA - MRI w/ right MCA/ACA infarcts - CTA head   3. Uncontrolled T2DM - Following with internal medicine - Jardiance 10mg  daily (as a part of Synjardy 5-500 mg BID) - A1C>14   4. HIV - Well controlled CD4; undetectable viral load   5. Intra-abdominal abscess Repeat CT abdomen on 10/02/2021 demonstrated improvement in previously noted abscess.    6. Tobacco use - Stopped smoking in 2008  7. SDOH -Patient uses pill packs for her medications at [facility]. Struggles to take medications that are not in her pill packs. Make sure all medications are sent to [facility], she only fills [place] at [facility]. -Provided scale in clinic today -Transportation issues (took bus to clinic today). HF SW discussed Medicaid transport with her today.    Follow up 12/13/2021 with Dr. Cleda Daub, PharmD, BCPS, [facility], [place] [facility] Pharmacist [phone]

## 2021-11-21 ENCOUNTER — Telehealth (HOSPITAL_COMMUNITY): Payer: Self-pay | Admitting: Emergency Medicine

## 2021-11-21 DIAGNOSIS — I5022 Chronic systolic (congestive) heart failure: Secondary | ICD-10-CM

## 2021-11-21 MED ORDER — IVABRADINE HCL 5 MG PO TABS: 15.0000 mg | ORAL_TABLET | Freq: Once | ORAL | 0 refills | Status: AC

## 2021-11-21 NOTE — Telephone Encounter (Signed)
Reaching out to patient to offer assistance regarding upcoming cardiac imaging study; pt verbalizes understanding of appt date/time, parking situation and where to check in, pre-test NPO status and medications ordered, and verified current allergies; name and call back number provided for further questions should they arise Marchia Bond RN Navigator Cardiac Imaging [facility] [phone] office [phone] cell  Arrival 230 w/c entrance 15mg  ivabradine  Denies iv issues Hold spironolactone

## 2021-11-22 ENCOUNTER — Ambulatory Visit (HOSPITAL_COMMUNITY)
Admission: RE | Admit: 2021-11-22 | Discharge: 2021-11-22 | Disposition: A | Discharge status: Home / Self Care | Payer: Medicaid Other | Source: Ambulatory Visit | Attending: Cardiology | Admitting: Cardiology

## 2021-11-22 ENCOUNTER — Encounter (HOSPITAL_COMMUNITY): Payer: Self-pay

## 2021-11-22 DIAGNOSIS — I5020 Unspecified systolic (congestive) heart failure: Secondary | ICD-10-CM

## 2021-11-22 MED ORDER — NITROGLYCERIN 0.4 MG SL SUBL: 0.8000 mg | SUBLINGUAL_TABLET | Freq: Once | SUBLINGUAL | Status: DC

## 2021-11-22 NOTE — Progress Notes (Signed)
Pt for CT coronary. Baseline HR 94. Pt on Coreg 6.25mg  BID. Premedicated with Metoprolol 25mg  and Ivabradine 15mg . Upon arrival HR 86-90 NSR SBP 107-103. Dr. Aundra Dubin called. Pt to be rescheduled in another modality. Marchia Bond, RN aware.

## 2021-11-22 NOTE — Progress Notes (Signed)
Pt's cell phone battery dead. Called daughter, Heloise Purpura, for ride home. Pt at [facility] entrance. Explained that her mother was unable to have her scan done d/t high HR/low BP and that Marchia Bond would be in touch to reschedule pt in another modality. All questions answered to satisfaction.

## 2021-11-26 ENCOUNTER — Telehealth: Payer: Self-pay

## 2021-11-26 NOTE — Telephone Encounter (Signed)
Received voicemail from patient requesting call back regarding medication. Returned cal, no answer. Left HIPAA compliant voicemail requesting callback.   Beryle Flock, RN

## 2021-12-04 ENCOUNTER — Inpatient Hospital Stay: Payer: Medicaid Other | Admitting: Neurology

## 2021-12-04 ENCOUNTER — Encounter: Payer: Self-pay | Admitting: Neurology

## 2021-12-05 ENCOUNTER — Encounter: Payer: Self-pay | Admitting: Infectious Diseases

## 2021-12-05 ENCOUNTER — Other Ambulatory Visit: Payer: Self-pay

## 2021-12-05 ENCOUNTER — Ambulatory Visit (INDEPENDENT_AMBULATORY_CARE_PROVIDER_SITE_OTHER): Payer: Medicaid Other | Admitting: Infectious Diseases

## 2021-12-05 VITALS — BP 122/80 | HR 101 | Temp 98.0°F | Ht 62.0 in | Wt 119.0 lb

## 2021-12-05 DIAGNOSIS — E1140 Type 2 diabetes mellitus with diabetic neuropathy, unspecified: Secondary | ICD-10-CM

## 2021-12-05 DIAGNOSIS — Z794 Long term (current) use of insulin: Secondary | ICD-10-CM | POA: Diagnosis not present

## 2021-12-05 DIAGNOSIS — E1165 Type 2 diabetes mellitus with hyperglycemia: Secondary | ICD-10-CM | POA: Diagnosis not present

## 2021-12-05 DIAGNOSIS — Z23 Encounter for immunization: Secondary | ICD-10-CM

## 2021-12-05 DIAGNOSIS — B20 Human immunodeficiency virus [HIV] disease: Secondary | ICD-10-CM | POA: Diagnosis present

## 2021-12-05 MED ORDER — BIKTARVY 50-200-25 MG PO TABS: 1.0000 | ORAL_TABLET | Freq: Every day | ORAL | 11 refills | Status: DC

## 2021-12-05 NOTE — Progress Notes (Signed)
Name: Ashley Vaughn  DOB: [birth year] MRN: [id number] PCP: Marrianne Mood, MD     Subjective:   Chief Complaint  Patient presents with   Follow-up     HPI: Ms. Maj is here for 33m follow up after expressing she has had a hard time taking her [facility] as prescribed. LOV in July 23 she was still undetectable. Has been working with IR / Gen surgery for percutaneous drain management. She again says that she has not had her [place] filled consistently since May or June of this year.    Having some trouble remembering appointments - missed a few with the drain clinic/surgery team.  Depressed mood, her medication helps a little but largely this is situational. Had several of her grandchildren removed from the home and cares for an Autistic 74 yr old grand baby now. Challenges at home with her daughter.  Still unable to put on weight. Started on Jardiance by HF clinic recently.       12/05/2021    1:42 PM  Depression screen PHQ 2/9  Decreased Interest 2  Down, Depressed, Hopeless 2  PHQ - 2 Score 4  Altered sleeping 2  Tired, decreased energy 2  Change in appetite 1  Feeling bad or failure about yourself  2  Trouble concentrating 2  Moving slowly or fidgety/restless 2  Suicidal thoughts 0  PHQ-9 Score 15  Difficult doing work/chores Very difficult    Review of Systems  Constitutional:  Positive for malaise/fatigue and weight loss. Negative for chills and fever.  HENT:  Negative for sore throat.        No dental problems  Respiratory:  Negative for cough and sputum production.   Cardiovascular:  Negative for chest pain and leg swelling.  Gastrointestinal:  Negative for abdominal pain, diarrhea and vomiting.  Genitourinary:  Negative for dysuria and flank pain.  Musculoskeletal:  Negative for joint pain, myalgias and neck pain.  Skin:  Negative for rash.  Neurological:  Negative for dizziness, tingling and headaches.  Endo/Heme/Allergies:  Positive for polydipsia.   Psychiatric/Behavioral:  Positive for depression. Negative for substance abuse. The patient is nervous/anxious and has insomnia.     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   Seizure (HCC) 07/18/2021    Outpatient Medications Prior to Visit  Medication Sig Dispense Refill   Accu-Chek Softclix Lancets lancets Check blood sugar 3 times per day 270 each 3   B-D UF III MINI PEN NEEDLES 31G X 5 MM MISC Use to inject insulin one time daily 100 each 3   Blood Glucose Monitoring Suppl (ACCU-CHEK GUIDE ME) w/Device KIT 1 kit by Does not apply route daily. 1 kit 0   Blood Glucose Monitoring Suppl (ACCU-CHEK NANO SMARTVIEW) w/Device KIT 1 each by Does not apply route 2 (two) times daily. Check blood sugar 4x a day before meals and bedtime  2 days each week. Dx code- 250.00. 1  kit 0   carvedilol (COREG) 6.25 MG tablet Take 6.25 mg by mouth 2 (two) times daily with a meal.     clopidogrel (PLAVIX) 75 MG tablet Take 1 tablet (75 mg total) by mouth daily. 30 tablet 11   DULoxetine (CYMBALTA) 20 MG capsule Take 1 capsule (20 mg total) by mouth 2 (two) times daily. 60 capsule 11   Empagliflozin-metFORMIN HCl (SYNJARDY) 5-500 MG TABS Take 1 tablet by mouth 2 (two) times daily. 60 tablet 11   glucose blood (ACCU-CHEK GUIDE) test strip Check blood sugar 3 times per day 270 each 3   insulin glargine (LANTUS SOLOSTAR) 100 UNIT/ML Solostar Pen Inject 30 Units into the skin daily. 15  mL 3   Lancets Misc. (ACCU-CHEK SOFTCLIX LANCET DEV) KIT Use to check blood sugar up to 3 times a day before meals. 1 kit 1   rosuvastatin (CRESTOR) 20 MG tablet Take 1 tablet (20 mg total) by mouth daily. 90 tablet 3   sacubitril-valsartan (ENTRESTO) 24-26 MG Take 1 tablet by mouth 2 (two) times daily. 60 tablet 11   spironolactone (ALDACTONE) 25 MG tablet Take 0.5 tablets (12.5 mg total) by mouth daily. 45 tablet 3   Sodium Chloride Flush (NORMAL SALINE FLUSH) 0.9 % SOLN Flush 2 times a day as directed 750 mL 1   ODEFSEY 200-25-25 MG TABS tablet Take 1 tablet by mouth every morning. (Patient not taking: Reported on 12/05/2021) 60 tablet 2   No facility-administered medications prior to visit.     Allergies  Allergen Reactions   Metformin And Related    Fluorescein Itching and Rash    INTRAVENOUSLY    Social History   Tobacco Use   Smoking status: Never    Passive exposure: Current   Smokeless tobacco: Never  Vaping Use   Vaping Use: Never used  Substance Use Topics   Alcohol use: Not Currently    Comment: occasional   Drug use: No    Comment: h/o cocaine abuse, clean since 2009    Family History  Problem Relation Age of Onset   Diabetes Mother    Hypertension Mother    Heart disease Father    Arthritis Father    Diabetes Maternal Uncle     Social History   Substance and Sexual Activity  Sexual Activity Never   Partners: Male   Comment: pt. declined condoms     Objective:   Vitals:   12/05/21 1339  BP: 122/80  Pulse: (!) 101  Temp: 98 F (36.7 C)  TempSrc: Oral  SpO2: 100%  Weight: 119 lb (54 kg)  Height: $Remove'5\' 2"'tysRkFN$  (1.575 m)   Body mass index is 21.77 kg/m.  Physical Exam Vitals reviewed.  Constitutional:      Appearance: Normal appearance. She is not ill-appearing.  Cardiovascular:     Rate and Rhythm: Normal rate.  Pulmonary:     Effort: Pulmonary effort is normal.     Breath sounds: Normal breath sounds.  Abdominal:     General: Bowel  sounds are normal.     Palpations: Abdomen is soft.     Comments: Abdominal drain in place - redressed today.  Neurological:     Mental Status: She is alert.     Lab Results Lab Results  Component Value Date   WBC 8.1 08/24/2021   HGB 10.2 (L) 08/24/2021   HCT 30.0 (L) 08/24/2021   MCV 71.3 (L) 08/24/2021   PLT 594 (H) 08/24/2021    Lab Results  Component Value  Date   CREATININE 0.87 11/01/2021   BUN 13 11/01/2021   NA 137 11/01/2021   K 4.2 11/01/2021   CL 99 11/01/2021   CO2 21 11/01/2021    Lab Results  Component Value Date   ALT 13 08/24/2021   AST 12 (L) 08/24/2021   ALKPHOS 85 08/24/2021   BILITOT 0.4 08/24/2021    Lab Results  Component Value Date   CHOL 148 07/19/2021   HDL 34 (L) 07/19/2021   LDLCALC 90 07/19/2021   TRIG 118 07/19/2021   CHOLHDL 4.4 07/19/2021   HIV 1 RNA Quant  Date Value  08/28/2021 NOT DETECTED copies/mL  05/04/2021 Not Detected Copies/mL  03/16/2021 1,740 copies/mL (H)   CD4 T Cell Abs (/uL)  Date Value  08/28/2021 1,076  09/06/2019 1,491  03/12/2019 1,208     Assessment & Plan:   Problem List Items Addressed This Visit       Unprioritized   Poorly controlled type 2 diabetes mellitus with neuropathy ([place]) (Chronic)    Explained that this is likely a big reason contributing to her weight loss. Hopeful that with medication tweaks that her A1C will improve (>14% recently).       Human immunodeficiency virus (HIV) disease ([place]) - Primary    She reports she has not received her Odefsey since may or June. Interestingly in July she was undetectable and CD4 was preserved. Will repeat today to see if she has rebound viremia yet. We discussed switcing to biktarvy  For smaller and more flexible pill that does not require food and has a higher barrier to resistance.   RTC in 19m with ID pharmacy team to repeat VL for therapeutic response and make sure she has been receiving her medication. She was apparently calling PCP office to  see why she was not getting her HIV medication and seems to be getting a little confused given all the medical visits of late with various care providers.   Flu shot given today. Declined COVID vaccine booster.       Relevant Medications   bictegravir-emtricitabine-tenofovir AF (BIKTARVY) 50-200-25 MG TABS tablet   Other Visit Diagnoses     Need for immunization against influenza       Relevant Orders   Flu Vaccine QUAD 10mo+IM (Fluarix, Fluzone & Alfiuria Quad PF) (Completed)      BMP collected per HF team request   Janene Madeira, MSN, NP-C [place] [facility] [place] Pager: [phone] Office: [phone]  12/05/21  3:29 PM

## 2021-12-05 NOTE — Patient Instructions (Addendum)
Nice to see you!  Will STOP the [facility] and switch you to [employer] is the pill I would like for you to start taking to treat you - this will need to be taken once a day around the same time.  - Common side effects for a short time frame usually include headaches, nausea and diarrhea - OK to take over the counter tylenol for headaches and imodium for diarrhea - Try taking with food if you are nauseated  - If you take any multivitamins or supplements please separate them from your Biktarvy by 6 hours before and after.  The main thing is do not have them in the stomach at the same time.  Try setting a pill alarm for twice a day on your phone to help you remember. Your brain is over saturated with stuff so you need something to help you remember.   Will plan to have you back at the end of December with our pharmacy team to check in with you and your new medication.

## 2021-12-05 NOTE — Addendum Note (Signed)
Addended by: Caffie Pinto on: 12/05/2021 04:58 PM   Modules accepted: Orders

## 2021-12-05 NOTE — Assessment & Plan Note (Signed)
Explained that this is likely a big reason contributing to her weight loss. Hopeful that with medication tweaks that her A1C will improve (>14% recently).

## 2021-12-05 NOTE — Assessment & Plan Note (Signed)
She reports she has not received her [place] since may or June. Interestingly in July she was undetectable and CD4 was preserved. Will repeat today to see if she has rebound viremia yet. We discussed switcing to biktarvy  For smaller and more flexible pill that does not require food and has a higher barrier to resistance.   RTC in 60m with ID pharmacy team to repeat VL for therapeutic response and make sure she has been receiving her medication. She was apparently calling PCP office to see why she was not getting her HIV medication and seems to be getting a little confused given all the medical visits of late with various care providers.   Flu shot given today. Declined COVID vaccine booster.

## 2021-12-11 ENCOUNTER — Other Ambulatory Visit: Payer: Medicaid Other

## 2021-12-13 ENCOUNTER — Encounter (HOSPITAL_COMMUNITY): Payer: Medicaid Other | Admitting: Cardiology

## 2021-12-14 ENCOUNTER — Telehealth: Payer: Self-pay

## 2021-12-14 NOTE — Telephone Encounter (Signed)
Patient aware Phillips Odor was sent to [facility] on [address].   [place], CMA

## 2021-12-14 NOTE — Telephone Encounter (Signed)
Pt stated she needs a refill on her medication. She also stated this was a new medication and she could not verify the name. Pt asks that a nurse give her a call

## 2021-12-18 ENCOUNTER — Encounter (HOSPITAL_COMMUNITY): Payer: Medicaid Other | Admitting: Cardiology

## 2021-12-18 ENCOUNTER — Other Ambulatory Visit: Payer: Medicaid Other

## 2021-12-21 ENCOUNTER — Other Ambulatory Visit: Payer: Self-pay

## 2021-12-21 DIAGNOSIS — B20 Human immunodeficiency virus [HIV] disease: Secondary | ICD-10-CM

## 2021-12-21 MED ORDER — BIKTARVY 50-200-25 MG PO TABS: 1.0000 | ORAL_TABLET | Freq: Every day | ORAL | 11 refills | Status: DC

## 2021-12-21 NOTE — Addendum Note (Signed)
Addended by: Nani Gasser on: 12/21/2021 02:37 PM   Modules accepted: Orders

## 2021-12-27 ENCOUNTER — Telehealth: Payer: Self-pay

## 2021-12-27 NOTE — Telephone Encounter (Addendum)
Pt is requesting a call back .Ashley Vaughn She is stating that her meds ( new )  is causing her  to feel high  and she can't  function  .Ashley Vaughn She has taken and appt with Dr Sloan Leiter 11/15 also  that is the first  appt

## 2021-12-27 NOTE — Telephone Encounter (Signed)
Call from patient states her medication is causing her to feel dizzy. Unable to function during the day.  Does ok when she takes her night medications.  Please call and let her know she needs to do.

## 2021-12-31 ENCOUNTER — Other Ambulatory Visit: Payer: Medicaid Other

## 2021-12-31 ENCOUNTER — Encounter (HOSPITAL_COMMUNITY): Payer: Self-pay | Admitting: Cardiology

## 2021-12-31 ENCOUNTER — Other Ambulatory Visit: Payer: Self-pay

## 2021-12-31 ENCOUNTER — Ambulatory Visit (HOSPITAL_COMMUNITY)
Admission: RE | Admit: 2021-12-31 | Discharge: 2021-12-31 | Disposition: A | Discharge status: Home / Self Care | Payer: Medicaid Other | Source: Ambulatory Visit | Attending: Cardiology | Admitting: Cardiology

## 2021-12-31 ENCOUNTER — Telehealth: Payer: Self-pay | Admitting: Infectious Disease

## 2021-12-31 VITALS — BP 140/76 | HR 104 | Wt 124.4 lb

## 2021-12-31 DIAGNOSIS — Z79899 Other long term (current) drug therapy: Secondary | ICD-10-CM | POA: Insufficient documentation

## 2021-12-31 DIAGNOSIS — E118 Type 2 diabetes mellitus with unspecified complications: Secondary | ICD-10-CM | POA: Diagnosis not present

## 2021-12-31 DIAGNOSIS — R000 Tachycardia, unspecified: Secondary | ICD-10-CM | POA: Insufficient documentation

## 2021-12-31 DIAGNOSIS — Z21 Asymptomatic human immunodeficiency virus [HIV] infection status: Secondary | ICD-10-CM

## 2021-12-31 DIAGNOSIS — I5022 Chronic systolic (congestive) heart failure: Secondary | ICD-10-CM | POA: Diagnosis not present

## 2021-12-31 DIAGNOSIS — Z8673 Personal history of transient ischemic attack (TIA), and cerebral infarction without residual deficits: Secondary | ICD-10-CM | POA: Diagnosis not present

## 2021-12-31 DIAGNOSIS — K651 Peritoneal abscess: Secondary | ICD-10-CM | POA: Insufficient documentation

## 2021-12-31 DIAGNOSIS — Z794 Long term (current) use of insulin: Secondary | ICD-10-CM | POA: Insufficient documentation

## 2021-12-31 DIAGNOSIS — I110 Hypertensive heart disease with heart failure: Secondary | ICD-10-CM | POA: Diagnosis not present

## 2021-12-31 DIAGNOSIS — Z7984 Long term (current) use of oral hypoglycemic drugs: Secondary | ICD-10-CM | POA: Diagnosis not present

## 2021-12-31 DIAGNOSIS — I63411 Cerebral infarction due to embolism of right middle cerebral artery: Secondary | ICD-10-CM

## 2021-12-31 DIAGNOSIS — B20 Human immunodeficiency virus [HIV] disease: Secondary | ICD-10-CM | POA: Diagnosis not present

## 2021-12-31 DIAGNOSIS — Z87891 Personal history of nicotine dependence: Secondary | ICD-10-CM | POA: Insufficient documentation

## 2021-12-31 DIAGNOSIS — E1165 Type 2 diabetes mellitus with hyperglycemia: Secondary | ICD-10-CM | POA: Insufficient documentation

## 2021-12-31 DIAGNOSIS — I10 Essential (primary) hypertension: Secondary | ICD-10-CM

## 2021-12-31 NOTE — H&P (View-Only) (Signed)
ADVANCED HEART FAILURE CLINIC NOTE  Referring Physician: Dr. Nani Gasser Primary Care: Dr. Carin Primrose Primary Cardiologist: N/A  HPI: Ashley Vaughn is a 56 y.o. female with uncontrolled T2DM, well controlled HIV (last viral load undetectable), stroke (6/23), recently diagnosed HFrEF, HTN and intra-abdominal abscess with IR drain in place presenting today to establish care.   According to Ashley Vaughn she has very limited resources at home. She currently lives with her daughter. Earlier this year she had an appendectomy that was complicated by a postoperative abscess requiring a percutaneous drainage catheter on 07/14/21. 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 In late May 2023, she was admitted to [facility] after having a seizure-like episode with motor/sensory deficits. MRI confirmed with right MCA/ACA infarcts. TTE at that time also confirmed newly diagnosed HFrEF w/ LVEF 30%-35%. She was unable to start much GDMT or undergo ischemic evaluation at the time due to an AKI.   Interval hx:  -  significant improvement in functional status since starting Entresto. Has remained fairly euvolemic.  - She remains very concerned about her upcoming abdominal surgery; reports that she will need pre-operative clearance.   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   [place]    Current Outpatient Medications  Medication Sig Dispense Refill   Accu-Chek Softclix Lancets lancets Check blood sugar 3 times per day 270 each 3   B-D UF III MINI PEN NEEDLES 31G X 5 MM MISC Use to inject insulin one time daily 100 each 3   bictegravir-emtricitabine-tenofovir AF (BIKTARVY) 50-200-25 MG TABS tablet Take 1 tablet by mouth daily. 30 tablet 11   Blood Glucose Monitoring Suppl (ACCU-CHEK GUIDE ME) w/Device KIT 1 kit by Does not apply route daily. 1 kit 0   Blood Glucose Monitoring Suppl (ACCU-CHEK NANO SMARTVIEW) w/Device KIT 1 each by Does not apply route 2 (two) times daily. Check blood sugar 4x a day before meals and bedtime  2 days each week. Dx code- 250.00. 1 kit 0   carvedilol (COREG) 6.25 MG tablet Take 6.25 mg by mouth 2 (two) times daily with a meal.     clopidogrel (PLAVIX) 75 MG tablet Take 1 tablet (75 mg total) by mouth daily. 30 tablet 11   DULoxetine (CYMBALTA) 20 MG capsule Take 1 capsule (20 mg total) by mouth 2 (two) times daily. 60 capsule 11   Empagliflozin-metFORMIN HCl (SYNJARDY) 5-500 MG TABS Take 1 tablet by mouth 2 (two) times daily. 60 tablet 11   glucose blood (ACCU-CHEK GUIDE) test strip Check blood sugar 3 times per day 270 each 3  insulin glargine (LANTUS SOLOSTAR) 100 UNIT/ML Solostar Pen Inject 30 Units into the skin daily. 15 mL 3   Lancets Misc. (ACCU-CHEK SOFTCLIX LANCET DEV) KIT Use to check blood sugar up to 3 times a day before meals. 1 kit 1   rosuvastatin (CRESTOR) 20 MG tablet Take 1 tablet (20 mg total) by mouth daily. 90 tablet 3   sacubitril-valsartan (ENTRESTO) 24-26 MG Take 1 tablet by mouth 2 (two) times daily. 60 tablet 11   spironolactone (ALDACTONE) 25 MG tablet Take  0.5 tablets (12.5 mg total) by mouth daily. 45 tablet 3   Sodium Chloride Flush (NORMAL SALINE FLUSH) 0.9 % SOLN Flush 2 times a day as directed (Patient not taking: Reported on 12/31/2021) 750 mL 1   No current facility-administered medications for this encounter.    Allergies  Allergen Reactions   Metformin And Related    Fluorescein Itching and Rash    INTRAVENOUSLY      Social History   Socioeconomic History   Marital status: Legally Separated    Spouse name: Not on file   Number of children: Not on file   Years of education: Not on file   Highest education level: Not on file  Occupational History   Not on file  Tobacco Use   Smoking status: Never    Passive exposure: Current   Smokeless tobacco: Never  Vaping Use   Vaping Use: Never used  Substance and Sexual Activity   Alcohol use: Not Currently    Comment: occasional   Drug use: No    Comment: h/o cocaine abuse, clean since 2009   Sexual activity: Never    Partners: Male    Comment: pt. declined condoms  Other Topics Concern   Not on file  Social History Narrative   Not on file   Social Determinants of Health   Financial Resource Strain: High Risk (11/01/2021)   Overall Financial Resource Strain (CARDIA)    Difficulty of Paying Living Expenses: Very hard  Food Insecurity: Food Insecurity Present (09/27/2021)   Hunger Vital Sign    Worried About Running Out of Food in the Last Year: Often true    Ran Out of Food in the Last Year: Often true  Transportation Needs: Unmet Transportation Needs (11/20/2021)   PRAPARE - Hydrologist (Medical): Yes    Lack of Transportation (Non-Medical): Yes  Physical Activity: Not on file  Stress: Not on file  Social Connections: Not on file  Intimate Partner Violence: Not on file      Family History  Problem Relation Age of Onset   Diabetes Mother    Hypertension Mother    Heart disease Father    Arthritis Father    Diabetes Maternal  Uncle     Vitals:   12/31/21 1059  BP: (!) 140/76  Pulse: (!) 104  SpO2: 100%  Weight: 56.4 kg (124 lb 6.4 oz)    PHYSICAL EXAM: GENERAL: thin AAF in NAD. NECK: Supple, No masses. Normal carotid upstrokes without bruits. No masses or thyromegaly.    CHEST: There are no chest wall deformities. There is no chest wall tenderness. Respirations are unlabored.  Lungs- CTA B/L; no inspiratory crackles or rales CARDIAC:  JVP: <7         NS1S2 Normal rate with regular rhythm. No murmurs, rubs or gallops.  Pulses are 2+ and symmetrical in upper and lower extremities. No edema.  ABDOMEN: soft; mild RLQ tenderness to palpation; drain w/ bandages on  abdomen; drainage noted on bandages EXTREMITIES: Warm and well perfused with no cyanosis, clubbing.  LYMPHATIC: No axillary or supraclavicular lymphadenopathy.  NEUROLOGIC: Patient is oriented x3 with no focal or lateralizing neurologic deficits.  PSYCH: Patients affect is appropriate, there is no evidence of anxiety or depression.  SKIN: Warm and dry; no lesions or wounds.   DATA REVIEW  ECG: NSR as per my read  ECHO: 07/21/21: LVEF 30-35%,  07/23/21 (TEE): LVEF 30-35%; normal RV function, small right to left shunt.  ASSESSMENT & PLAN:  NYHA IIB, Heart failure with reduced ejection fraction Etiology of HF: Although she doesn't complain of typical angina, considerable vascular disease (CVA, carotid stenosis, grade II aortic atheroma on TEE) and uncontrolled T2DM are concerning for multivessel CAD. Unable to have CTA done for coronary evaluation due to tachycardia. Will plan for LHC/RHC for pre-operative clearance in the setting of recently reduced LVEF & vascular disease. NYHA class / AHA Stage: II Volume status & Diuretics: Euvolemic - hypovolemic Vasodilators: continue entresto 24/91m BID, states she feels very lethargic when her BP drops too low. Will hold off on uptitrating at this time.  Beta-Blocker: continue toprol XL 270mdaily MRA:  continue spironolactone 12.773m373maily Cardiometabolic: jardiance [id number] Devices therapies & Valvulopathies: No valvular disease; will continue GDMT before consideration for primary prevention ICD Advanced therapies: Not a candidate at this time.  2. CVA - MRI w/ right MCA/ACA infarcts  3. Uncontrolled T2DM - Following with internal medicine - Jardiance [id number]  - Continue insulin - A1C remains >14  4. HIV - Well controlled CD4; undetectable viral load  5. Intra-abdominal abscess Repeat CT abdomen on 8/15 demonstrated improvement in previously noted abscess.   6. Tobacco use - Stopped smoking in 2008  Follow-up:  73m  59mtya Twala [facility]ings [facility]

## 2021-12-31 NOTE — Progress Notes (Signed)
ADVANCED HEART FAILURE CLINIC NOTE  Referring Physician: Dr. Nani Gasser Primary Care: Dr. Carin Primrose Primary Cardiologist: N/A  HPI: Ashley Vaughn is a 56 y.o. female with uncontrolled T2DM, well controlled HIV (last viral load undetectable), stroke (6/23), recently diagnosed HFrEF, HTN and intra-abdominal abscess with IR drain in place presenting today to establish care.   According to Ashley Vaughn she has very limited resources at home. She currently lives with her daughter. Earlier this year she had an appendectomy that was complicated by a postoperative abscess requiring a percutaneous drainage catheter on 07/14/21. 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 In late May 2023, she was admitted to [facility] after having a seizure-like episode with motor/sensory deficits. MRI confirmed with right MCA/ACA infarcts. TTE at that time also confirmed newly diagnosed HFrEF w/ LVEF 30%-35%. She was unable to start much GDMT or undergo ischemic evaluation at the time due to an AKI.   Interval hx:  -  significant improvement in functional status since starting Entresto. Has remained fairly euvolemic.  - She remains very concerned about her upcoming abdominal surgery; reports that she will need pre-operative clearance.   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   [place] 07[place]18[place]    Current Outpatient Medications  Medication Sig Dispense Refill   Accu-Chek Softclix Lancets lancets Check blood sugar 3 times per day 270 each 3   B-D UF III MINI PEN NEEDLES 31G X 5 MM MISC Use to inject insulin one time daily 100 each 3   bictegravir-emtricitabine-tenofovir AF (BIKTARVY) 50-200-25 MG TABS tablet Take 1 tablet by mouth daily. 30 tablet 11   Blood Glucose Monitoring Suppl (ACCU-CHEK GUIDE ME) w/Device KIT 1 kit by Does not apply route daily. 1 kit 0   Blood Glucose Monitoring Suppl (ACCU-CHEK NANO SMARTVIEW) w/Device KIT 1 each by Does not apply route 2 (two) times daily. Check blood sugar 4x a day before meals and bedtime  2 days each week. Dx code- 250.00. 1 kit 0   carvedilol (COREG) 6.25 MG tablet Take 6.25 mg by mouth 2 (two) times daily with a meal.     clopidogrel (PLAVIX) 75 MG tablet Take 1 tablet (75 mg total) by mouth daily. 30 tablet 11   DULoxetine (CYMBALTA) 20 MG capsule Take 1 capsule (20 mg total) by mouth 2 (two) times daily. 60 capsule 11   Empagliflozin-metFORMIN HCl (SYNJARDY) 5-500 MG TABS Take 1 tablet by mouth 2 (two) times daily. 60 tablet 11   glucose blood (ACCU-CHEK GUIDE) test strip Check blood sugar 3 times per day 270 each 3  insulin glargine (LANTUS SOLOSTAR) 100 UNIT/ML Solostar Pen Inject 30 Units into the skin daily. 15 mL 3   Lancets Misc. (ACCU-CHEK SOFTCLIX LANCET DEV) KIT Use to check blood sugar up to 3 times a day before meals. 1 kit 1   rosuvastatin (CRESTOR) 20 MG tablet Take 1 tablet (20 mg total) by mouth daily. 90 tablet 3   sacubitril-valsartan (ENTRESTO) 24-26 MG Take 1 tablet by mouth 2 (two) times daily. 60 tablet 11   spironolactone (ALDACTONE) 25 MG tablet Take  0.5 tablets (12.5 mg total) by mouth daily. 45 tablet 3   Sodium Chloride Flush (NORMAL SALINE FLUSH) 0.9 % SOLN Flush 2 times a day as directed (Patient not taking: Reported on 12/31/2021) 750 mL 1   No current facility-administered medications for this encounter.    Allergies  Allergen Reactions   Metformin And Related    Fluorescein Itching and Rash    INTRAVENOUSLY      Social History   Socioeconomic History   Marital status: Legally Separated    Spouse name: Not on file   Number of children: Not on file   Years of education: Not on file   Highest education level: Not on file  Occupational History   Not on file  Tobacco Use   Smoking status: Never    Passive exposure: Current   Smokeless tobacco: Never  Vaping Use   Vaping Use: Never used  Substance and Sexual Activity   Alcohol use: Not Currently    Comment: occasional   Drug use: No    Comment: h/o cocaine abuse, clean since 2009   Sexual activity: Never    Partners: Male    Comment: pt. declined condoms  Other Topics Concern   Not on file  Social History Narrative   Not on file   Social Determinants of Health   Financial Resource Strain: High Risk (11/01/2021)   Overall Financial Resource Strain (CARDIA)    Difficulty of Paying Living Expenses: Very hard  Food Insecurity: Food Insecurity Present (09/27/2021)   Hunger Vital Sign    Worried About Running Out of Food in the Last Year: Often true    Ran Out of Food in the Last Year: Often true  Transportation Needs: Unmet Transportation Needs (11/20/2021)   PRAPARE - Hydrologist (Medical): Yes    Lack of Transportation (Non-Medical): Yes  Physical Activity: Not on file  Stress: Not on file  Social Connections: Not on file  Intimate Partner Violence: Not on file      Family History  Problem Relation Age of Onset   Diabetes Mother    Hypertension Mother    Heart disease Father    Arthritis Father    Diabetes Maternal  Uncle     Vitals:   12/31/21 1059  BP: (!) 140/76  Pulse: (!) 104  SpO2: 100%  Weight: 56.4 kg (124 lb 6.4 oz)    PHYSICAL EXAM: GENERAL: thin AAF in NAD. NECK: Supple, No masses. Normal carotid upstrokes without bruits. No masses or thyromegaly.    CHEST: There are no chest wall deformities. There is no chest wall tenderness. Respirations are unlabored.  Lungs- CTA B/L; no inspiratory crackles or rales CARDIAC:  JVP: <7         NS1S2 Normal rate with regular rhythm. No murmurs, rubs or gallops.  Pulses are 2+ and symmetrical in upper and lower extremities. No edema.  ABDOMEN: soft; mild RLQ tenderness to palpation; drain w/ bandages on  abdomen; drainage noted on bandages EXTREMITIES: Warm and well perfused with no cyanosis, clubbing.  LYMPHATIC: No axillary or supraclavicular lymphadenopathy.  NEUROLOGIC: Patient is oriented x3 with no focal or lateralizing neurologic deficits.  PSYCH: Patients affect is appropriate, there is no evidence of anxiety or depression.  SKIN: Warm and dry; no lesions or wounds.   DATA REVIEW  ECG: NSR as per my read  ECHO: 07/21/21: LVEF 30-35%,  07/23/21 (TEE): LVEF 30-35%; normal RV function, small right to left shunt.  ASSESSMENT & PLAN:  NYHA IIB, Heart failure with reduced ejection fraction Etiology of HF: Although she doesn't complain of typical angina, considerable vascular disease (CVA, carotid stenosis, grade II aortic atheroma on TEE) and uncontrolled T2DM are concerning for multivessel CAD. Unable to have CTA done for coronary evaluation due to tachycardia. Will plan for LHC/RHC for pre-operative clearance in the setting of recently reduced LVEF & vascular disease. NYHA class / AHA Stage: II Volume status & Diuretics: Euvolemic - hypovolemic Vasodilators: continue entresto 24/66m BID, states she feels very lethargic when her BP drops too low. Will hold off on uptitrating at this time.  Beta-Blocker: continue toprol XL 259mdaily MRA:  continue spironolactone 12.45m42maily Cardiometabolic: jardiance [id number] Devices therapies & Valvulopathies: No valvular disease; will continue GDMT before consideration for primary prevention ICD Advanced therapies: Not a candidate at this time.  2. CVA - MRI w/ right MCA/ACA infarcts  3. Uncontrolled T2DM - Following with internal medicine - Jardiance [id number]  - Continue insulin - A1C remains >14  4. HIV - Well controlled CD4; undetectable viral load  5. Intra-abdominal abscess Repeat CT abdomen on 8/15 demonstrated improvement in previously noted abscess.   6. Tobacco use - Stopped smoking in 2008  Follow-up:  63m  63mtya Dawan Farney [facility]

## 2021-12-31 NOTE — Patient Instructions (Signed)
No changes to medications  Your physician has requested that you have an echocardiogram. Echocardiography is a painless test that uses sound waves to create images of your heart. It provides your doctor with information about the size and shape of your heart and how well your heart's chambers and valves are working. This procedure takes approximately one hour. There are no restrictions for this procedure. Please do NOT wear cologne, perfume, aftershave, or lotions (deodorant is allowed). Please arrive 15 minutes prior to your appointment time.  Prescription given today for blood pressure cuff  Your physician recommends that you schedule a follow-up appointment in: 6 months ([id number] ) Call in March to schedule an appointment     You are scheduled for a Cardiac Catheterization on Monday, November 20 with Dr.Aditya Sabharwal D O.  1. Please arrive at the [facility] (Main Entrance A) at [facility]: [address] [place], [place] [address] at 7:00 AM (This time is two hours before your procedure to ensure your preparation). Free valet parking service is available.   Special note: Every effort is made to have your procedure done on time. Please understand that emergencies sometimes delay scheduled procedures.  2. Diet: Do not eat solid foods after midnight.  The patient may have clear liquids until 5am upon the day of the procedure  Medication instructions in preparation for your procedure: Hold Synjardy am of procedure Night before Lantus take 15 units half of usual dose     On the morning of your procedure, take you any morning medicines NOT listed above.  You may use sips of water.  5. Plan for one night stay--bring personal belongings. 6. Bring a current list of your medications and current insurance cards. 7. You MUST have a responsible person to drive you home. 8. Someone MUST be with you the first 24 hours after you arrive home or your discharge will be delayed. 9. Please  wear clothes that are easy to get on and off and wear slip-on shoes.  If you have any questions or concerns before your next appointment please send [place] a message through [place] or call our office at [phone].    TO LEAVE A MESSAGE FOR THE NURSE SELECT OPTION 2, PLEASE LEAVE A MESSAGE INCLUDING: YOUR NAME DATE OF BIRTH CALL BACK NUMBER REASON FOR CALL**this is important as we prioritize the call backs  YOU WILL RECEIVE A CALL BACK THE SAME DAY AS LONG AS YOU CALL BEFORE 4:00 PM   At the [facility], you and your health needs are our priority. As part of our continuing mission to provide you with exceptional heart care, we have created designated Provider Care Teams. These Care Teams include your primary Cardiologist (physician) and Advanced Practice Providers (APPs- Physician Assistants and Nurse Practitioners) who all work together to provide you with the care you need, when you need it.   You may see any of the following providers on your designated Care Team at your next follow up: Dr Arvilla Meres Dr Marca Ancona Dr. Marcos Eke, NP Robbie Lis, [place] [facility] [place], [place] Brynda Peon, NP Karle Plumber, PharmD   Please be sure to bring in all your medications bottles to every appointment.   Marland Kitchen

## 2021-12-31 NOTE — Telephone Encounter (Signed)
ERROR

## 2022-01-02 ENCOUNTER — Ambulatory Visit (INDEPENDENT_AMBULATORY_CARE_PROVIDER_SITE_OTHER): Payer: Medicaid Other | Admitting: Internal Medicine

## 2022-01-02 ENCOUNTER — Encounter: Payer: Self-pay | Admitting: Internal Medicine

## 2022-01-02 ENCOUNTER — Other Ambulatory Visit: Payer: Self-pay

## 2022-01-02 VITALS — BP 114/70 | HR 95 | Temp 97.5°F | Ht 62.0 in | Wt 121.0 lb

## 2022-01-02 DIAGNOSIS — R634 Abnormal weight loss: Secondary | ICD-10-CM

## 2022-01-02 DIAGNOSIS — Z7902 Long term (current) use of antithrombotics/antiplatelets: Secondary | ICD-10-CM | POA: Diagnosis not present

## 2022-01-02 DIAGNOSIS — I5020 Unspecified systolic (congestive) heart failure: Secondary | ICD-10-CM | POA: Diagnosis not present

## 2022-01-02 DIAGNOSIS — Z7984 Long term (current) use of oral hypoglycemic drugs: Secondary | ICD-10-CM

## 2022-01-02 DIAGNOSIS — E1140 Type 2 diabetes mellitus with diabetic neuropathy, unspecified: Secondary | ICD-10-CM | POA: Diagnosis present

## 2022-01-02 DIAGNOSIS — T8143XA Infection following a procedure, organ and space surgical site, initial encounter: Secondary | ICD-10-CM | POA: Diagnosis not present

## 2022-01-02 DIAGNOSIS — E1165 Type 2 diabetes mellitus with hyperglycemia: Secondary | ICD-10-CM

## 2022-01-02 DIAGNOSIS — Z794 Long term (current) use of insulin: Secondary | ICD-10-CM

## 2022-01-02 LAB — POCT GLYCOSYLATED HEMOGLOBIN (HGB A1C): Hemoglobin A1C: 12.8 % — AB (ref 4.0–5.6)

## 2022-01-02 LAB — GLUCOSE, CAPILLARY: Glucose-Capillary: 216 mg/dL — ABNORMAL HIGH (ref 70–99)

## 2022-01-02 MED ORDER — LANTUS SOLOSTAR 100 UNIT/ML ~~LOC~~ SOPN: 15.0000 [IU] | PEN_INJECTOR | Freq: Every day | SUBCUTANEOUS | 0 refills | Status: DC

## 2022-01-02 MED ORDER — BASAGLAR KWIKPEN 100 UNIT/ML ~~LOC~~ SOPN: 15.0000 [IU] | PEN_INJECTOR | Freq: Every day | SUBCUTANEOUS | 3 refills | Status: DC

## 2022-01-02 MED ORDER — LANTUS SOLOSTAR 100 UNIT/ML ~~LOC~~ SOPN: 10.0000 [IU] | PEN_INJECTOR | Freq: Every day | SUBCUTANEOUS | 0 refills | Status: DC

## 2022-01-02 NOTE — Progress Notes (Signed)
Subjective:  CC: diabetes follow-up  HPI:  [name] is a 56 y.o. female with a past medical history stated below and presents today for follow-up on diabetes. 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 General surgery recommended a partial colectomy and she is having a heart cath on 11/20 for cardiac clearance. She has difficulty with the drain that is in place and liquid frequently drips out of bag onto her clothes. Please see problem based assessment and plan for additional details.  Past Medical History:  Diagnosis Date   Depression    Diabetes mellitus    Fistula    Heart failure (HCC)    HIV infection ([place])    HSV (herpes simplex virus) anogenital infection 03/12/2019   Postoperative infection 07/18/2021   Patient had appendectomy early in May, followed by a hospitalization and drain placement for an intrabdominal infection and abscess.   Seizure ([place]) 07/18/2021    Current Outpatient Medications on File Prior to Visit  Medication Sig Dispense Refill   Accu-Chek Softclix Lancets lancets Check blood sugar 3 times per day 270 each 3   B-D UF III MINI PEN NEEDLES 31G X 5 MM MISC Use to inject insulin one time daily 100 each 3   bictegravir-emtricitabine-tenofovir AF (BIKTARVY) 50-200-25 MG TABS tablet Take 1 tablet by mouth daily. 30 tablet 11   Blood Glucose Monitoring Suppl (ACCU-CHEK GUIDE ME) w/Device KIT 1 kit by Does not apply route daily. 1 kit 0   Blood Glucose Monitoring Suppl (ACCU-CHEK NANO SMARTVIEW) w/Device KIT 1 each by Does not apply route 2 (two) times daily. Check blood sugar 4x a day before meals and bedtime  2 days each week. Dx code- 250.00. 1 kit 0   carvedilol (COREG) 6.25 MG tablet Take 6.25 mg by mouth 2 (two) times daily with a meal.     clopidogrel (PLAVIX) 75 MG tablet Take 1 tablet (75 mg total) by mouth daily. 30 tablet 11   DULoxetine (CYMBALTA) 20 MG capsule Take 1 capsule (20 mg  total) by mouth 2 (two) times daily. 60 capsule 11   Empagliflozin-metFORMIN HCl (SYNJARDY) 5-500 MG TABS Take 1 tablet by mouth 2 (two) times daily. 60 tablet 11   glucose blood (ACCU-CHEK GUIDE) test strip Check blood sugar 3 times per day 270 each 3   Lancets Misc. (ACCU-CHEK SOFTCLIX LANCET DEV) KIT Use to check blood sugar up to 3 times a day before meals. 1 kit 1   rosuvastatin (CRESTOR) 20 MG tablet Take 1 tablet (20 mg total) by mouth daily. 90 tablet 3   sacubitril-valsartan (ENTRESTO) 24-26 MG Take 1 tablet by mouth 2 (two) times daily. 60 tablet 11   No current facility-administered medications on file prior to visit.    Family History  Problem Relation Age of Onset   Diabetes Mother    Hypertension Mother    Heart disease Father    Arthritis Father    Diabetes Maternal Uncle     Social History   Socioeconomic History   Marital status: Legally Separated    Spouse name: Not on file   Number of children: Not on file   Years of education: Not on file   Highest education level: Not on file  Occupational History   Not on file  Tobacco Use   Smoking status: Never    Passive exposure: Current   Smokeless tobacco: Never  Vaping Use  Vaping Use: Never used  Substance and Sexual Activity   Alcohol use: Not Currently    Comment: occasional   Drug use: No    Comment: h/o cocaine abuse, clean since 2009   Sexual activity: Never    Partners: Male    Comment: pt. declined condoms  Other Topics Concern   Not on file  Social History Narrative   Not on file   Social Determinants of Health   Financial Resource Strain: High Risk (11/01/2021)   Overall Financial Resource Strain (CARDIA)    Difficulty of Paying Living Expenses: Very hard  Food Insecurity: No Food Insecurity (01/02/2022)   Hunger Vital Sign    Worried About Running Out of Food in the Last Year: Never true    Ran Out of Food in the Last Year: Never true  Transportation Needs: Unmet Transportation Needs  (01/02/2022)   PRAPARE - Hydrologist (Medical): Yes    Lack of Transportation (Non-Medical): Yes  Physical Activity: Not on file  Stress: Not on file  Social Connections: Socially Isolated (01/02/2022)   Social Connection and Isolation Panel [NHANES]    Frequency of Communication with Friends and Family: Once a week    Frequency of Social Gatherings with Friends and Family: Once a week    Attends Religious Services: Never    Marine scientist or Organizations: No    Attends Archivist Meetings: Never    Marital Status: Separated  Intimate Partner Violence: Not At Risk (01/02/2022)   Humiliation, Afraid, Rape, and Kick questionnaire    Fear of Current or Ex-Partner: No    Emotionally Abused: No    Physically Abused: No    Sexually Abused: No    Review of Systems: ROS negative except for what is noted on the assessment and plan.  Objective:   Vitals:   01/02/22 1308  BP: 114/70  Pulse: 95  Temp: (!) 97.5 F (36.4 C)  TempSrc: Oral  SpO2: 100%  Weight: 121 lb (54.9 kg)  Height: _0  (1.575 m)    Physical Exam: Constitutional: chronically ill appearing Cardiovascular: regular rate and rhythm, no m/r/g Pulmonary/Chest: normal work of breathing on room air, lungs clear to auscultation bilaterally Abdominal: fistula drain in RLQ with dressing in place, twist valve at end of drainage bag leaking bowel fluids onto pants MSK: normal bulk and tone Skin: warm and dry  Assessment & Plan:  Weight loss Her weight is at 121 lb. She has lost about 20 lbs since 6/23. She now has food stamps and is able to buy her own food. Her appetite is good.  -continue to monitor  Poorly controlled type 2 diabetes mellitus with neuropathy (HCC) HgbA1c improved from 14 to 12.8. She has been taking synjardy daily in pill packets. She ran out of insulin a few months ago. She has continued to check her blood sugar 2 times daily but does not have meter  with her today. Morning blood sugars are between 180-220. She lives with her daughter and does have access to a fridge. A: Diabetes remains uncontrolled. 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  P: Restart lantus 15 units nightly Continue synjardy 5-541m BID Follow-up in 4 weeks with meter  Postprocedural intraabdominal abscess Continues to have difficulty with taking care of drain from fistula. The twist valve at end of drainage bag was open and spilling onto her clothes. Her family does not help with this. GSander Nephew RN redressed drain and  flushed drainage bag. Twist valve was closed. General surgery has recommended she have partial colectomy and she is undergoing cardiac clearance for this.  HFrEF (heart failure with reduced ejection fraction) ([place]) For the last several weeks she has had dizziness about an hour after taking medications. She received pill packets from [employer]. On chart review, spironolactone 12.5 mg was added to GDMT at end of September.  Orthostatics with HR increasing form 88 to 102 with lying to standing.  A/P: Orthostatics positive with heart rate. I would like to discontinue spironolactone. She is not sure which pill that is. I will call [facility] to tell them to not put this in next cycle of medication. Follow-up in 4 weeks  Addendum 11/21: I called and talked with him his pharmacy.  Her next refill for pill packets is November 29.  They have discontinued spironolactone from her medication list.   Patient discussed with Dr. Hansel Starling Karie Skowron, D.O. [facility]  PGY-2 Pager: [phone]  Phone: [phone] Date 01/08/2022  Time 1:41 PM

## 2022-01-02 NOTE — Assessment & Plan Note (Signed)
Her weight is at 121 lb. She has lost about 20 lbs since 6/23. She now has food stamps and is able to buy her own food. Her appetite is good.  -continue to monitor

## 2022-01-02 NOTE — Patient Instructions (Addendum)
Thank you, [name] for allowing [place] to provide your care today.   Diabetes Your A1c is better than in July. It has decreased form 14 to 12.8. The goal for A1c is to be less than 7.  Continue taking Synjardy. Please restart Lantus at 15 units. Follow-up in 4 weeks and bring your glucometer in.  Dizziness With you having a pill packet is is harder to discontinue medications.  I am going to call [name] to ask them to take spironolactone out of next months medications.  Please return the call to the general surgery office at [phone].    I have ordered the following labs for you:  Lab Orders         Glucose, capillary         POC Hbg A1C      Referrals ordered today:   Referral Orders  No referral(s) requested today     I have ordered the following medication/changed the following medications:   Stop the following medications: Medications Discontinued During This Encounter  Medication Reason   insulin glargine (LANTUS SOLOSTAR) 100 UNIT/ML Solostar Pen Reorder   insulin glargine (LANTUS SOLOSTAR) 100 UNIT/ML Solostar Pen Reorder   insulin glargine (LANTUS SOLOSTAR) 100 UNIT/ML Solostar Pen Reorder   spironolactone (ALDACTONE) 25 MG tablet Discontinued by provider     Start the following medications: Meds ordered this encounter  Medications   DISCONTD: insulin glargine (LANTUS SOLOSTAR) 100 UNIT/ML Solostar Pen    Sig: Inject 10 Units into the skin daily.    Dispense:  20 mL    Refill:  0   DISCONTD: insulin glargine (LANTUS SOLOSTAR) 100 UNIT/ML Solostar Pen    Sig: Inject 15 Units into the skin daily.    Dispense:  30 mL    Refill:  0   Insulin Glargine (BASAGLAR KWIKPEN) 100 UNIT/ML    Sig: Inject 15 Units into the skin daily.    Dispense:  15 mL    Refill:  3     Follow up: 4 weeks   We look forward to seeing you next time. Please call our clinic at [phone] if you have any questions or concerns. The best time to call is Monday-Friday from  9am-4pm, but there is someone available 24/7. If after hours or the weekend, call the main hospital number and ask for the Internal Medicine Resident On-Call. If you need medication refills, please notify your pharmacy one week in advance and they will send [place] a request.   Thank you for trusting me with your care. Wishing you the best!   Rudene Christians, DO [facility]

## 2022-01-04 ENCOUNTER — Encounter: Payer: Self-pay | Admitting: Internal Medicine

## 2022-01-04 ENCOUNTER — Telehealth (HOSPITAL_COMMUNITY): Payer: Self-pay

## 2022-01-04 NOTE — Assessment & Plan Note (Signed)
HgbA1c improved from 14 to 12.8. She has been taking synjardy daily in pill packets. She ran out of insulin a few months ago. She has continued to check her blood sugar 2 times daily but does not have meter with her today. Morning blood sugars are between 180-220. She lives with her daughter and does have access to a fridge. A: Diabetes remains uncontrolled. 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  P: Restart lantus 15 units nightly Continue synjardy 5-500mg  BID Follow-up in 4 weeks with meter

## 2022-01-04 NOTE — Telephone Encounter (Signed)
Ashley Vaughn from [facility] called requesting recent records. Patient is scheduled for abdominal surgery. She also sent a fax over. Phone number is [phone]. Fax is [phone]

## 2022-01-04 NOTE — Assessment & Plan Note (Signed)
Continues to have difficulty with taking care of drain from fistula. The twist valve at end of drainage bag was open and spilling onto her clothes. Her family does not help with this. Ashley Ok, RN redressed drain and flushed drainage bag. Twist valve was closed. General surgery has recommended she have partial colectomy and she is undergoing cardiac clearance for this.

## 2022-01-04 NOTE — Assessment & Plan Note (Addendum)
For the last several weeks she has had dizziness about an hour after taking medications. She received pill packets from [employer]. On chart review, spironolactone 12.5 mg was added to GDMT at end of September.  Orthostatics with HR increasing form 88 to 102 with lying to standing.  A/P: Orthostatics positive with heart rate. I would like to discontinue spironolactone. She is not sure which pill that is. I will call [facility] to tell them to not put this in next cycle of medication. Follow-up in 4 weeks  Addendum 11/21: I called and talked with him his pharmacy.  Her next refill for pill packets is November 29.  They have discontinued spironolactone from her medication list.

## 2022-01-07 ENCOUNTER — Ambulatory Visit (HOSPITAL_COMMUNITY)
Admission: RE | Disposition: A | Discharge status: Home / Self Care | Payer: Self-pay | Source: Home / Self Care | Attending: Cardiology

## 2022-01-07 ENCOUNTER — Ambulatory Visit (HOSPITAL_COMMUNITY)
Admission: RE | Admit: 2022-01-07 | Discharge: 2022-01-07 | Disposition: A | Discharge status: Home / Self Care | Payer: Medicaid Other | Attending: Cardiology | Admitting: Cardiology

## 2022-01-07 ENCOUNTER — Other Ambulatory Visit: Payer: Self-pay

## 2022-01-07 ENCOUNTER — Encounter (HOSPITAL_COMMUNITY): Payer: Self-pay | Admitting: Cardiology

## 2022-01-07 DIAGNOSIS — I509 Heart failure, unspecified: Secondary | ICD-10-CM

## 2022-01-07 DIAGNOSIS — E1165 Type 2 diabetes mellitus with hyperglycemia: Secondary | ICD-10-CM | POA: Diagnosis not present

## 2022-01-07 DIAGNOSIS — Z8673 Personal history of transient ischemic attack (TIA), and cerebral infarction without residual deficits: Secondary | ICD-10-CM | POA: Insufficient documentation

## 2022-01-07 DIAGNOSIS — I110 Hypertensive heart disease with heart failure: Secondary | ICD-10-CM | POA: Diagnosis present

## 2022-01-07 DIAGNOSIS — Z794 Long term (current) use of insulin: Secondary | ICD-10-CM | POA: Diagnosis not present

## 2022-01-07 DIAGNOSIS — Z87891 Personal history of nicotine dependence: Secondary | ICD-10-CM | POA: Diagnosis not present

## 2022-01-07 DIAGNOSIS — I5020 Unspecified systolic (congestive) heart failure: Secondary | ICD-10-CM | POA: Insufficient documentation

## 2022-01-07 DIAGNOSIS — Z21 Asymptomatic human immunodeficiency virus [HIV] infection status: Secondary | ICD-10-CM | POA: Insufficient documentation

## 2022-01-07 DIAGNOSIS — I2510 Atherosclerotic heart disease of native coronary artery without angina pectoris: Secondary | ICD-10-CM | POA: Diagnosis not present

## 2022-01-07 DIAGNOSIS — Z79899 Other long term (current) drug therapy: Secondary | ICD-10-CM | POA: Insufficient documentation

## 2022-01-07 DIAGNOSIS — Z7984 Long term (current) use of oral hypoglycemic drugs: Secondary | ICD-10-CM | POA: Insufficient documentation

## 2022-01-07 HISTORY — PX: RIGHT/LEFT HEART CATH AND CORONARY ANGIOGRAPHY: CATH118266

## 2022-01-07 LAB — POCT I-STAT EG7
Acid-Base Excess: 0 mmol/L (ref 0.0–2.0)
Bicarbonate: 24.8 mmol/L (ref 20.0–28.0)
Calcium, Ion: 1.25 mmol/L (ref 1.15–1.40)
HCT: 29 % — ABNORMAL LOW (ref 36.0–46.0)
Hemoglobin: 9.9 g/dL — ABNORMAL LOW (ref 12.0–15.0)
O2 Saturation: 68 %
Potassium: 3.8 mmol/L (ref 3.5–5.1)
Sodium: 137 mmol/L (ref 135–145)
TCO2: 26 mmol/L (ref 22–32)
pCO2, Ven: 41.7 mmHg — ABNORMAL LOW (ref 44–60)
pH, Ven: 7.383 (ref 7.25–7.43)
pO2, Ven: 36 mmHg (ref 32–45)

## 2022-01-07 LAB — CBC
HCT: 35.8 % — ABNORMAL LOW (ref 36.0–46.0)
Hemoglobin: 10.9 g/dL — ABNORMAL LOW (ref 12.0–15.0)
MCH: 20.9 pg — ABNORMAL LOW (ref 26.0–34.0)
MCHC: 30.4 g/dL (ref 30.0–36.0)
MCV: 68.6 fL — ABNORMAL LOW (ref 80.0–100.0)
Platelets: 467 10*3/uL — ABNORMAL HIGH (ref 150–400)
RBC: 5.22 MIL/uL — ABNORMAL HIGH (ref 3.87–5.11)
RDW: 18 % — ABNORMAL HIGH (ref 11.5–15.5)
WBC: 7.1 10*3/uL (ref 4.0–10.5)
nRBC: 0 % (ref 0.0–0.2)

## 2022-01-07 LAB — POCT I-STAT, CHEM 8
BUN: 14 mg/dL (ref 6–20)
Calcium, Ion: 1.18 mmol/L (ref 1.15–1.40)
Chloride: 104 mmol/L (ref 98–111)
Creatinine, Ser: 1.1 mg/dL — ABNORMAL HIGH (ref 0.44–1.00)
Glucose, Bld: 250 mg/dL — ABNORMAL HIGH (ref 70–99)
HCT: 29 % — ABNORMAL LOW (ref 36.0–46.0)
Hemoglobin: 9.9 g/dL — ABNORMAL LOW (ref 12.0–15.0)
Potassium: 3.9 mmol/L (ref 3.5–5.1)
Sodium: 136 mmol/L (ref 135–145)
TCO2: 23 mmol/L (ref 22–32)

## 2022-01-07 LAB — GLUCOSE, CAPILLARY
Glucose-Capillary: 232 mg/dL — ABNORMAL HIGH (ref 70–99)
Glucose-Capillary: 223 mg/dL — ABNORMAL HIGH (ref 70–99)

## 2022-01-07 SURGERY — RIGHT/LEFT HEART CATH AND CORONARY ANGIOGRAPHY
Anesthesia: LOCAL

## 2022-01-07 MED ORDER — IOHEXOL 350 MG/ML SOLN
INTRAVENOUS | Status: DC | PRN
  Administered 2022-01-07: 80 mL

## 2022-01-07 MED ORDER — HYDRALAZINE HCL 20 MG/ML IJ SOLN: 10.0000 mg | INTRAMUSCULAR | Status: DC | PRN

## 2022-01-07 MED ORDER — MIDAZOLAM HCL 2 MG/2ML IJ SOLN
INTRAMUSCULAR | Status: AC
  Filled 2022-01-07: qty 2

## 2022-01-07 MED ORDER — SODIUM CHLORIDE 0.9 % IV SOLN: 250.0000 mL | INTRAVENOUS | Status: DC | PRN

## 2022-01-07 MED ORDER — LABETALOL HCL 5 MG/ML IV SOLN: 10.0000 mg | INTRAVENOUS | Status: DC | PRN

## 2022-01-07 MED ORDER — HEPARIN SODIUM (PORCINE) 1000 UNIT/ML IJ SOLN
INTRAMUSCULAR | Status: AC
  Filled 2022-01-07: qty 10

## 2022-01-07 MED ORDER — MIDAZOLAM HCL 2 MG/2ML IJ SOLN
INTRAMUSCULAR | Status: DC | PRN
  Administered 2022-01-07: 1 mg via INTRAVENOUS

## 2022-01-07 MED ORDER — SODIUM CHLORIDE 0.9% FLUSH: 3.0000 mL | INTRAVENOUS | Status: DC | PRN

## 2022-01-07 MED ORDER — ASPIRIN 81 MG PO CHEW
81.0000 mg | CHEWABLE_TABLET | ORAL | Status: AC
  Administered 2022-01-07: 81 mg via ORAL
  Filled 2022-01-07: qty 1

## 2022-01-07 MED ORDER — CLOPIDOGREL BISULFATE 75 MG PO TABS: 75.0000 mg | ORAL_TABLET | Freq: Once | ORAL | Status: DC

## 2022-01-07 MED ORDER — VERAPAMIL HCL 2.5 MG/ML IV SOLN
INTRAVENOUS | Status: DC | PRN
  Administered 2022-01-07: 10 mL via INTRA_ARTERIAL

## 2022-01-07 MED ORDER — ACETAMINOPHEN 325 MG PO TABS: 650.0000 mg | ORAL_TABLET | ORAL | Status: DC | PRN

## 2022-01-07 MED ORDER — FENTANYL CITRATE (PF) 100 MCG/2ML IJ SOLN
INTRAMUSCULAR | Status: DC | PRN
  Administered 2022-01-07: 25 ug via INTRAVENOUS

## 2022-01-07 MED ORDER — FENTANYL CITRATE (PF) 100 MCG/2ML IJ SOLN
INTRAMUSCULAR | Status: AC
  Filled 2022-01-07: qty 2

## 2022-01-07 MED ORDER — SODIUM CHLORIDE 0.9% FLUSH: 3.0000 mL | Freq: Two times a day (BID) | INTRAVENOUS | Status: DC

## 2022-01-07 MED ORDER — SODIUM CHLORIDE 0.9 % IV SOLN: INTRAVENOUS | Status: DC

## 2022-01-07 MED ORDER — HEPARIN (PORCINE) IN NACL 1000-0.9 UT/500ML-% IV SOLN
INTRAVENOUS | Status: AC
  Filled 2022-01-07: qty 1000

## 2022-01-07 MED ORDER — ONDANSETRON HCL 4 MG/2ML IJ SOLN: 4.0000 mg | Freq: Four times a day (QID) | INTRAMUSCULAR | Status: DC | PRN

## 2022-01-07 MED ORDER — LIDOCAINE HCL (PF) 1 % IJ SOLN
INTRAMUSCULAR | Status: AC
  Filled 2022-01-07: qty 30

## 2022-01-07 MED ORDER — HEPARIN (PORCINE) IN NACL 1000-0.9 UT/500ML-% IV SOLN
INTRAVENOUS | Status: DC | PRN
  Administered 2022-01-07 (×2): 500 mL

## 2022-01-07 MED ORDER — LIDOCAINE HCL (PF) 1 % IJ SOLN
INTRAMUSCULAR | Status: DC | PRN
  Administered 2022-01-07: 5 mL

## 2022-01-07 MED ORDER — VERAPAMIL HCL 2.5 MG/ML IV SOLN
INTRAVENOUS | Status: AC
  Filled 2022-01-07: qty 2

## 2022-01-07 SURGICAL SUPPLY — 16 items
CATH BALLN WEDGE 5F 110CM (CATHETERS) IMPLANT
CATH INFINITI 5 FR 3DRC (CATHETERS) IMPLANT
CATH INFINITI 5 FR AR2 MOD (CATHETERS) IMPLANT
CATH INFINITI JR4 5F (CATHETERS) IMPLANT
CATH OPTITORQUE TIG 4.0 5F (CATHETERS) IMPLANT
DEVICE RAD TR BAND REGULAR (VASCULAR PRODUCTS) IMPLANT
ELECT DEFIB PAD ADLT CADENCE (PAD) IMPLANT
GLIDESHEATH SLEND SS 6F .021 (SHEATH) IMPLANT
GUIDEWIRE .025 260CM (WIRE) IMPLANT
GUIDEWIRE INQWIRE 1.5J.035X260 (WIRE) IMPLANT
INQWIRE 1.5J .035X260CM (WIRE) ×1
PACK CARDIAC CATHETERIZATION (CUSTOM PROCEDURE TRAY) ×1 IMPLANT
SHEATH GLIDE SLENDER 4/5FR (SHEATH) IMPLANT
SHEATH PROBE COVER 6X72 (BAG) IMPLANT
TRANSDUCER W/STOPCOCK (MISCELLANEOUS) ×1 IMPLANT
WIRE MICROINTRODUCER 60CM (WIRE) IMPLANT

## 2022-01-07 NOTE — Interval H&P Note (Signed)
History and Physical Interval Note:  01/07/2022 9:12 AM  Ashley Vaughn  has presented today for surgery, with the diagnosis of heart failure.  The various methods of treatment have been discussed with the patient and family. After consideration of risks, benefits and other options for treatment, the patient has consented to  Procedure(s): RIGHT/LEFT HEART CATH AND CORONARY ANGIOGRAPHY (N/A) as a surgical intervention.  The patient's history has been reviewed, patient examined, no change in status, stable for surgery.  I have reviewed the patient's chart and labs.  Questions were answered to the patient's satisfaction.     Ashley Vaughn

## 2022-01-08 ENCOUNTER — Telehealth (HOSPITAL_COMMUNITY): Payer: Self-pay

## 2022-01-08 MED FILL — Heparin Sodium (Porcine) Inj 1000 Unit/ML: INTRAMUSCULAR | Qty: 10 | Status: AC

## 2022-01-08 NOTE — Telephone Encounter (Signed)
Received a fax requesting medical records from [facility](per Rachell Cipro) Records were successfully faxed to: [phone], for Continuation of Care.

## 2022-01-09 LAB — POCT I-STAT 7, (LYTES, BLD GAS, ICA,H+H)
Acid-base deficit: 2 mmol/L (ref 0.0–2.0)
Bicarbonate: 22.4 mmol/L (ref 20.0–28.0)
Calcium, Ion: 1.18 mmol/L (ref 1.15–1.40)
HCT: 28 % — ABNORMAL LOW (ref 36.0–46.0)
Hemoglobin: 9.5 g/dL — ABNORMAL LOW (ref 12.0–15.0)
O2 Saturation: 96 %
Potassium: 3.7 mmol/L (ref 3.5–5.1)
Sodium: 138 mmol/L (ref 135–145)
TCO2: 24 mmol/L (ref 22–32)
pCO2 arterial: 37.7 mmHg (ref 32–48)
pH, Arterial: 7.383 (ref 7.35–7.45)
pO2, Arterial: 81 mmHg — ABNORMAL LOW (ref 83–108)

## 2022-01-09 LAB — POCT I-STAT EG7
Acid-base deficit: 2 mmol/L (ref 0.0–2.0)
Bicarbonate: 23.7 mmol/L (ref 20.0–28.0)
Calcium, Ion: 1.24 mmol/L (ref 1.15–1.40)
HCT: 29 % — ABNORMAL LOW (ref 36.0–46.0)
Hemoglobin: 9.9 g/dL — ABNORMAL LOW (ref 12.0–15.0)
O2 Saturation: 62 %
Potassium: 3.8 mmol/L (ref 3.5–5.1)
Sodium: 138 mmol/L (ref 135–145)
TCO2: 25 mmol/L (ref 22–32)
pCO2, Ven: 41.9 mmHg — ABNORMAL LOW (ref 44–60)
pH, Ven: 7.36 (ref 7.25–7.43)
pO2, Ven: 34 mmHg (ref 32–45)

## 2022-01-13 NOTE — Progress Notes (Signed)
Internal Medicine Clinic Attending  Case discussed with Dr. Sloan Leiter  At the time of the visit.  We reviewed the resident's history and exam and pertinent patient test results.  I agree with the assessment, diagnosis, and plan of care documented in the resident's note for this patient living with multiple complex problems including challenges with SDOH.

## 2022-01-14 ENCOUNTER — Ambulatory Visit (HOSPITAL_COMMUNITY): Payer: Medicaid Other | Attending: Cardiology

## 2022-01-28 ENCOUNTER — Telehealth (HOSPITAL_COMMUNITY): Payer: Self-pay

## 2022-01-28 NOTE — Telephone Encounter (Signed)
Received a fax requesting medical records from [facility]. Records were successfully faxed to: [phone] ,which was the number provided.. Medical request form will be scanned into patients chart.

## 2022-01-29 ENCOUNTER — Encounter: Payer: Self-pay | Admitting: Neurology

## 2022-01-29 ENCOUNTER — Inpatient Hospital Stay: Payer: Commercial Managed Care - HMO | Admitting: Neurology

## 2022-01-31 ENCOUNTER — Encounter: Payer: Self-pay | Admitting: Infectious Diseases

## 2022-01-31 ENCOUNTER — Other Ambulatory Visit: Payer: Self-pay

## 2022-01-31 ENCOUNTER — Ambulatory Visit (INDEPENDENT_AMBULATORY_CARE_PROVIDER_SITE_OTHER): Payer: Commercial Managed Care - HMO | Admitting: Infectious Diseases

## 2022-01-31 VITALS — BP 101/71 | HR 105 | Temp 97.4°F | Resp 16 | Ht 62.0 in | Wt 120.2 lb

## 2022-01-31 DIAGNOSIS — Z794 Long term (current) use of insulin: Secondary | ICD-10-CM | POA: Diagnosis not present

## 2022-01-31 DIAGNOSIS — B20 Human immunodeficiency virus [HIV] disease: Secondary | ICD-10-CM | POA: Diagnosis not present

## 2022-01-31 DIAGNOSIS — E1140 Type 2 diabetes mellitus with diabetic neuropathy, unspecified: Secondary | ICD-10-CM | POA: Diagnosis not present

## 2022-01-31 DIAGNOSIS — T8143XA Infection following a procedure, organ and space surgical site, initial encounter: Secondary | ICD-10-CM | POA: Diagnosis not present

## 2022-01-31 MED ORDER — ACCU-CHEK GUIDE VI STRP: ORAL_STRIP | 3 refills | Status: DC

## 2022-01-31 NOTE — Assessment & Plan Note (Signed)
More definative surgical management arranged with robotic assisted procedure coming up in a few days. She is having a hard time with maintaining drainage bag and chronic twisting.  She has been recommended partial colectomy. Finished up cardiac evaluation for pre-procedure optimization.

## 2022-01-31 NOTE — Patient Instructions (Addendum)
Will get you back in to see me in 4-6 months. You have a lot of other specialists that need your attention too. I wish you luck with you upcoming surgery.   Please keep taking your biktarvy everyday.   For Eye Exam:  Dr. [facility]  Address: [address] [place], [place], [place] [address] Phone: [phone]

## 2022-01-31 NOTE — Assessment & Plan Note (Signed)
She has done well switching from [facility] to [facility] - I think this is a good switch for her to free her of food requirement and plays well with other medicines.  Will repeat CMP and VL today. CD4 last > 1000 and no need to repeat today as this was collected within the last 12 months.   Will have her back in 46m for follow up care.

## 2022-01-31 NOTE — Progress Notes (Signed)
Name: Ashley Vaughn  DOB: May 12, 1965 MRN: [id number] PCP: Nani Gasser, MD     Subjective:   Chief Complaint  Patient presents with   Follow-up     HPI: Ashley Vaughn is here for 2 month follow up since switching her HIV medicine from [facility] and [facility]. Still working on housing with [facility]; but living with her daughter still.   She likes the [place] and has no trouble taking it. Usually takes it in the morning when she takes other medicines. Takes about 10 pills in the morning. She gets some dizziness that is helped with taking with food.   Has been working with IR / Gen surgery for percutaneous drain management. She again says that she has not had her [place] filled consistently since May or June of this year.  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   Since LOV she was admitted for right and left heart catheterization for a/c heart failure and following with [facility] for medical treatment.   She needs test strips again - asking if I get her a refill. Says she is trying her best to do better with diabetic and HF diet. Having a hard time not being in charge of her nutrition living with her daughter and on fixed income.       01/31/2022    1:44 PM  Depression screen PHQ 2/9  Decreased Interest 0  Down, Depressed, Hopeless 1  PHQ - 2 Score 1    Review of Systems  Constitutional:  Positive for malaise/fatigue and weight loss. Negative for chills and fever.  HENT:  Negative for sore throat.        No dental problems  Respiratory:  Negative for cough and sputum production.   Cardiovascular:  Negative for chest pain and leg swelling.  Gastrointestinal:  Negative for abdominal pain, diarrhea and vomiting.  Genitourinary:  Negative for dysuria and flank pain.  Musculoskeletal:  Negative for joint pain, myalgias and neck pain.  Skin:  Negative for rash.   Neurological:  Negative for dizziness, tingling and headaches.  Endo/Heme/Allergies:  Positive for polydipsia.  Psychiatric/Behavioral:  Positive for depression. Negative for substance abuse. The patient is nervous/anxious and has insomnia.     Past Medical History:  Diagnosis Date   Depression    Diabetes mellitus    Fistula    Heart failure (HCC)    HIV infection ([place])    HSV (herpes simplex virus) anogenital infection 03/12/2019   Postoperative infection 07/18/2021   Patient had appendectomy early in May, followed by a hospitalization and drain placement for an intrabdominal infection and abscess.   Seizure ([place]) 07/18/2021    Outpatient Medications Prior to Visit  Medication Sig Dispense Refill   Accu-Chek Softclix Lancets lancets Check blood sugar 3 times per day 270 each 3   B-D UF III MINI PEN NEEDLES 31G X 5 MM MISC Use to inject insulin one time daily 100 each 3   bictegravir-emtricitabine-tenofovir AF (BIKTARVY) 50-200-25 MG TABS tablet Take 1 tablet by mouth daily. 30 tablet 11   Blood Glucose Monitoring Suppl (ACCU-CHEK GUIDE ME) w/Device KIT 1 kit by Does not apply route daily. 1 kit 0   Blood Glucose Monitoring Suppl (ACCU-CHEK NANO SMARTVIEW) w/Device KIT 1 each by Does not apply route 2 (two) times daily. Check blood sugar 4x a day before meals and bedtime  2 days each week.  Dx code- 250.00. 1 kit 0   carvedilol (COREG) 6.25 MG tablet Take 6.25 mg by mouth 2 (two) times daily with a meal.     clopidogrel (PLAVIX) 75 MG tablet Take 1 tablet (75 mg total) by mouth daily. 30 tablet 11   DULoxetine (CYMBALTA) 20 MG capsule Take 1 capsule (20 mg total) by mouth 2 (two) times daily. 60 capsule 11   Empagliflozin-metFORMIN HCl (SYNJARDY) 5-500 MG TABS Take 1 tablet by mouth 2 (two) times daily. 60 tablet 11   Insulin Glargine (BASAGLAR KWIKPEN) 100 UNIT/ML Inject 15 Units into the skin daily. 15 mL 3   Lancets Misc. (ACCU-CHEK SOFTCLIX LANCET DEV) KIT Use to check blood sugar  up to 3 times a day before meals. 1 kit 1   rosuvastatin (CRESTOR) 20 MG tablet Take 1 tablet (20 mg total) by mouth daily. 90 tablet 3   sacubitril-valsartan (ENTRESTO) 24-26 MG Take 1 tablet by mouth 2 (two) times daily. 60 tablet 11   glucose blood (ACCU-CHEK GUIDE) test strip Check blood sugar 3 times per day 270 each 3   No facility-administered medications prior to visit.     Allergies  Allergen Reactions   Metformin And Related     Pt does not remember reaction    Fluorescein Itching and Rash    INTRAVENOUSLY    Social History   Tobacco Use   Smoking status: Never    Passive exposure: Current   Smokeless tobacco: Never  Vaping Use   Vaping Use: Never used  Substance Use Topics   Alcohol use: Not Currently    Comment: occasional   Drug use: No    Comment: h/o cocaine abuse, clean since 2009    Family History  Problem Relation Age of Onset   Diabetes Mother    Hypertension Mother    Heart disease Father    Arthritis Father    Diabetes Maternal Uncle     Social History   Substance and Sexual Activity  Sexual Activity Never   Partners: Male   Comment: pt. declined condoms     Objective:   Vitals:   01/31/22 1344  BP: 101/71  Pulse: (!) 105  Resp: 16  Temp: (!) 97.4 F (36.3 C)  SpO2: 99%  Weight: 120 lb 3.2 oz (54.5 kg)  Height: _0  (1.575 m)   Body mass index is 21.98 kg/m.  Physical Exam Vitals reviewed.  Constitutional:      Appearance: Normal appearance. She is not ill-appearing.  Cardiovascular:     Rate and Rhythm: Normal rate.  Pulmonary:     Effort: Pulmonary effort is normal.     Breath sounds: Normal breath sounds.  Abdominal:     General: Bowel sounds are normal.     Palpations: Abdomen is soft.     Comments: Abdominal drain in place - redressed today.  Neurological:     Mental Status: She is alert.     Lab Results Lab Results  Component Value Date   WBC 7.1 01/07/2022   HGB 9.9 (L) 01/07/2022   HGB 9.9 (L)  01/07/2022   HCT 29.0 (L) 01/07/2022   HCT 29.0 (L) 01/07/2022   MCV 68.6 (L) 01/07/2022   PLT 467 (H) 01/07/2022    Lab Results  Component Value Date   CREATININE 1.10 (H) 01/07/2022   BUN 14 01/07/2022   NA 137 01/07/2022   NA 138 01/07/2022   K 3.8 01/07/2022   K 3.8 01/07/2022   CL 104 01/07/2022  CO2 21 11/01/2021    Lab Results  Component Value Date   ALT 13 08/24/2021   AST 12 (L) 08/24/2021   ALKPHOS 85 08/24/2021   BILITOT 0.4 08/24/2021    Lab Results  Component Value Date   CHOL 148 07/19/2021   HDL 34 (L) 07/19/2021   LDLCALC 90 07/19/2021   TRIG 118 07/19/2021   CHOLHDL 4.4 07/19/2021   HIV 1 RNA Quant  Date Value  08/28/2021 NOT DETECTED copies/mL  05/04/2021 Not Detected Copies/mL  03/16/2021 1,740 copies/mL (H)   CD4 T Cell Abs (/uL)  Date Value  08/28/2021 1,076  09/06/2019 1,491  03/12/2019 1,208     Assessment & Plan:   Problem List Items Addressed This Visit       Unprioritized   Human immunodeficiency virus (HIV) disease ([place]) - Primary    She has done well switching from [facility] to [facility] - I think this is a good switch for her to free her of food requirement and plays well with other medicines.  Will repeat CMP and VL today. CD4 last > 1000 and no need to repeat today as this was collected within the last 12 months.   Will have her back in 25mfor follow up care.       Relevant Orders   COMPLETE METABOLIC PANEL WITH GFR   HIV 1 RNA quant-no reflex-bld   Postprocedural intraabdominal abscess    More definative surgical management arranged with robotic assisted procedure coming up in a few days. She is having a hard time with maintaining drainage bag and chronic twisting.  She has been recommended partial colectomy. Finished up cardiac evaluation for pre-procedure optimization.       Other Visit Diagnoses     Type 2 diabetes mellitus with diabetic neuropathy, with long-term current use of insulin ([place]       Relevant  Medications   glucose blood (ACCU-CHEK GUIDE) test strip       SJanene Madeira MSN, NP-C RLos Lucerosfor Infectious DTryonPager: [phone]: [phone] 01/31/22  2:17 PM

## 2022-02-04 LAB — COMPLETE METABOLIC PANEL WITH GFR
AG Ratio: 1 (calc) (ref 1.0–2.5)
ALT: 8 U/L (ref 6–29)
AST: 11 U/L (ref 10–35)
Albumin: 3.9 g/dL (ref 3.6–5.1)
Alkaline phosphatase (APISO): 96 U/L (ref 37–153)
BUN/Creatinine Ratio: 14 (calc) (ref 6–22)
BUN: 19 mg/dL (ref 7–25)
CO2: 25 mmol/L (ref 20–32)
Calcium: 9.7 mg/dL (ref 8.6–10.4)
Chloride: 98 mmol/L (ref 98–110)
Creat: 1.38 mg/dL — ABNORMAL HIGH (ref 0.50–1.03)
Globulin: 4.1 g/dL (calc) — ABNORMAL HIGH (ref 1.9–3.7)
Glucose, Bld: 352 mg/dL — ABNORMAL HIGH (ref 65–99)
Potassium: 4.7 mmol/L (ref 3.5–5.3)
Sodium: 134 mmol/L — ABNORMAL LOW (ref 135–146)
Total Bilirubin: 0.3 mg/dL (ref 0.2–1.2)
Total Protein: 8 g/dL (ref 6.1–8.1)
eGFR: 45 mL/min/{1.73_m2} — ABNORMAL LOW (ref >=60)

## 2022-02-04 LAB — HIV-1 RNA QUANT-NO REFLEX-BLD
HIV 1 RNA Quant: NOT DETECTED Copies/mL
HIV-1 RNA Quant, Log: NOT DETECTED Log cps/mL

## 2022-02-06 ENCOUNTER — Telehealth: Payer: Self-pay

## 2022-02-06 NOTE — Telephone Encounter (Signed)
-----   Message from Blanchard Kelch, NP sent at 02/06/2022  4:02 PM EST ----- Please let Ashley Vaughn know that her viral load is undetectable. She was correct - her blood sugar was very high at 352. Hopeful her primary care team can help her to get her sugars under better control before her upcoming surgery.

## 2022-02-06 NOTE — Telephone Encounter (Signed)
Spoke with Ashley Vaughn, relayed that her viral load is undetectable. Discussed that her blood sugar was elevated at 352 and advised her to contact PCP to work on a plan to get blood sugars under control before upcoming surgery. She reports stress has been contributing to her elevated blood sugars. She will call her PCP to set up an appointment.   Sandie Ano, RN

## 2022-02-06 NOTE — Progress Notes (Signed)
Please let Ashley Vaughn know that her viral load is undetectable. She was correct - her blood sugar was very high at 352. Hopeful her primary care team can help her to get her sugars under better control before her upcoming surgery.

## 2022-02-20 ENCOUNTER — Telehealth: Payer: Self-pay | Admitting: Student

## 2022-02-20 NOTE — Telephone Encounter (Signed)
Call to pharmacy: she Has [place] and [place]; has [place] Medicaid; Christella Scheuermann is not paying, but he does not get a message about what they will cover. Pharmacist says he will work on it and let us know if he needs anything.

## 2022-02-20 NOTE — Telephone Encounter (Signed)
Pt reporting that her pharmacy stated her Ins will not cover her test Strips.   glucose blood (ACCU-CHEK GUIDE) test strip   [id number] - [place], [place]

## 2022-02-21 ENCOUNTER — Telehealth: Payer: Self-pay | Admitting: Dietician

## 2022-02-21 DIAGNOSIS — E1165 Type 2 diabetes mellitus with hyperglycemia: Secondary | ICD-10-CM

## 2022-02-21 MED ORDER — ACCU-CHEK GUIDE ME W/DEVICE KIT: PACK | 0 refills | Status: DC

## 2022-02-21 NOTE — Telephone Encounter (Signed)
Cannot get a meter because [facility] won't take her medicaid and she does not have her other insurance card yet. Suggest reordering her prescription for the accu chek guide meter with free meter coupon.

## 2022-03-06 ENCOUNTER — Other Ambulatory Visit (HOSPITAL_COMMUNITY): Payer: Self-pay

## 2022-03-29 HISTORY — PX: OTHER SURGICAL HISTORY: SHX169

## 2022-04-04 ENCOUNTER — Telehealth: Payer: Self-pay

## 2022-04-04 NOTE — Transitions of Care (Post Inpatient/ED Visit) (Signed)
   04/04/2022  Name: Ashley Vaughn MRN: [id number] DOB: Oct 21, 1965  Today's TOC FU Call Status: Today's TOC FU Call Status:: Unsuccessul Call (1st Attempt) Unsuccessful Call (1st Attempt) Date: 04/04/22  Attempted to reach the patient regarding the most recent Inpatient/ED visit.  Follow Up Plan: Additional outreach attempts will be made to reach the patient to complete the Transitions of Care (Post Inpatient/ED visit) call.   Signature Juanda Crumble, [place] Direct Dial [phone]

## 2022-04-13 ENCOUNTER — Other Ambulatory Visit: Payer: Self-pay | Admitting: Internal Medicine

## 2022-04-13 DIAGNOSIS — E1140 Type 2 diabetes mellitus with diabetic neuropathy, unspecified: Secondary | ICD-10-CM

## 2022-05-08 ENCOUNTER — Encounter: Payer: BLUE CROSS/BLUE SHIELD | Admitting: Student

## 2022-05-29 ENCOUNTER — Telehealth: Payer: Self-pay

## 2022-05-29 ENCOUNTER — Ambulatory Visit: Payer: BLUE CROSS/BLUE SHIELD | Admitting: Dietician

## 2022-05-29 ENCOUNTER — Other Ambulatory Visit (HOSPITAL_COMMUNITY)
Admission: RE | Admit: 2022-05-29 | Discharge: 2022-05-29 | Disposition: A | Discharge status: Home / Self Care | Payer: BLUE CROSS/BLUE SHIELD | Source: Ambulatory Visit | Attending: Student in an Organized Health Care Education/Training Program | Admitting: Student in an Organized Health Care Education/Training Program

## 2022-05-29 ENCOUNTER — Ambulatory Visit (INDEPENDENT_AMBULATORY_CARE_PROVIDER_SITE_OTHER): Payer: BLUE CROSS/BLUE SHIELD | Admitting: Student

## 2022-05-29 ENCOUNTER — Encounter: Payer: BLUE CROSS/BLUE SHIELD | Admitting: Student

## 2022-05-29 ENCOUNTER — Encounter: Payer: Self-pay | Admitting: Student

## 2022-05-29 ENCOUNTER — Other Ambulatory Visit: Payer: Self-pay

## 2022-05-29 VITALS — BP 108/54 | HR 89 | Temp 97.9°F | Resp 24 | Ht 62.0 in | Wt 123.8 lb

## 2022-05-29 DIAGNOSIS — F322 Major depressive disorder, single episode, severe without psychotic features: Secondary | ICD-10-CM

## 2022-05-29 DIAGNOSIS — I5020 Unspecified systolic (congestive) heart failure: Secondary | ICD-10-CM | POA: Diagnosis not present

## 2022-05-29 DIAGNOSIS — E1140 Type 2 diabetes mellitus with diabetic neuropathy, unspecified: Secondary | ICD-10-CM

## 2022-05-29 DIAGNOSIS — E1165 Type 2 diabetes mellitus with hyperglycemia: Secondary | ICD-10-CM

## 2022-05-29 DIAGNOSIS — N898 Other specified noninflammatory disorders of vagina: Secondary | ICD-10-CM | POA: Diagnosis present

## 2022-05-29 DIAGNOSIS — Z1231 Encounter for screening mammogram for malignant neoplasm of breast: Secondary | ICD-10-CM

## 2022-05-29 DIAGNOSIS — G3184 Mild cognitive impairment, so stated: Secondary | ICD-10-CM

## 2022-05-29 DIAGNOSIS — Z7984 Long term (current) use of oral hypoglycemic drugs: Secondary | ICD-10-CM

## 2022-05-29 LAB — GLUCOSE, CAPILLARY: Glucose-Capillary: 273 mg/dL — ABNORMAL HIGH (ref 70–99)

## 2022-05-29 LAB — POCT GLYCOSYLATED HEMOGLOBIN (HGB A1C): Hemoglobin A1C: 11.5 % — AB (ref 4.0–5.6)

## 2022-05-29 MED ORDER — DULOXETINE HCL 40 MG PO CPEP: 40.0000 mg | ORAL_CAPSULE | Freq: Two times a day (BID) | ORAL | 11 refills | Status: DC

## 2022-05-29 MED ORDER — FLUCONAZOLE 150 MG PO TABS: 150.0000 mg | ORAL_TABLET | Freq: Every day | ORAL | 0 refills | Status: DC

## 2022-05-29 MED ORDER — DULOXETINE HCL 20 MG PO CPEP: 20.0000 mg | ORAL_CAPSULE | Freq: Two times a day (BID) | ORAL | 0 refills | Status: DC

## 2022-05-29 MED ORDER — FREESTYLE LIBRE 3 READER DEVI: 1.0000 | 0 refills | Status: DC

## 2022-05-29 MED ORDER — FREESTYLE LIBRE 3 SENSOR MISC: 2 refills | Status: DC

## 2022-05-29 NOTE — Progress Notes (Signed)
Subjective:  [name] is a 57 y.o. who presents to clinic due to concern for vaginal discharge and bilateral foot pain.  Diabetes, heart failure, and psychosocial activities were also addressed.  Regarding vaginal discharge it is described as thick and whitish.  It is associated with itching and discomfort.  She also has some cloudy urine but no dysuria.  She has no bleeding.  She is not sexually active.  It feels like yeast infections that she has had in the past.  Regarding foot pain, it is chronic but recently worsened somewhat from baseline.  It is described as tingly and burning.  She notices it more at night and it keeps her from getting good rest.  Regarding heart failure, she denies dyspnea or chest pain.  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  She does not get along with her daughter.  Her things sometimes disappear from the house, including her glucometer.  Because she cannot measure her blood glucose, she does not take insulin.  She has difficulty finding transportation to and from doctor appointments.  ROS No recent weight loss No chest pain or shortness of breath or leg swelling No abdominal pain No dysuria  Patient Active Problem List   Diagnosis Date Noted   Mild cognitive impairment 09/27/2021   Weight loss 09/04/2021   Housing insecurity 08/24/2021   Constipation 08/24/2021   AKI (acute kidney injury) 08/24/2021   HFrEF (heart failure with reduced ejection fraction) 07/27/2021   Postprocedural intraabdominal abscess 07/20/2021   CVA (cerebral vascular accident) 07/20/2021   S/P laparoscopic appendectomy 06/22/2021   Hypertension 12/21/2020   Vaginal itching 05/16/2017   Encounter for screening involving social determinants of health (SDoH) 03/18/2012   Major depressive disorder, single episode,  severe 01/05/2010   Human immunodeficiency virus (HIV) disease 10/13/2008   Poorly controlled type 2 diabetes mellitus with neuropathy 10/29/1999   Surgical history: Pertinent updates include 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  Objective:   Vitals:   05/29/22 0933 05/29/22 0941  BP: 97/60 (!) 108/54  Pulse: (!) 48 89  Resp: (!) 24   Temp: 97.9 F (36.6 C)   TempSrc: Oral   SpO2: 99%   Weight: 123 lb 12.8 oz (56.2 kg)   Height: 5\' 2"  (1.575 m)     Physical Exam Tired appearing No scleral icterus Heart rate normal, rhythm is regular, peripheral pulses including bilateral dorsalis pedis pulses are strong, no appreciable JVD, no lower extremity edema Breathing is regular and unlabored on room air, no wheezes or crackles Skin is warm and dry Alert and oriented, speech is fluent, decreased left patellar reflex relative to right Irritable and frustrated, mood and affect concordant  Assessment & Plan:  The primary encounter diagnosis was HFrEF (heart failure with reduced ejection fraction). Diagnoses of Poorly controlled type 2 diabetes mellitus with neuropathy, Vaginal itching, Breast cancer screening by mammogram, Major depressive disorder, single episode, severe, and Mild cognitive impairment were also pertinent to this visit.  HFrEF (heart failure with reduced ejection fraction) (HCC) Asymptomatic.  Euvolemic.  Borderline blood pressure today but no signs or symptoms of orthostatic hypotension.  No changes, continue GDMT.  Referral to exercise program sent, patient is amenable to participation. - Entresto 50 twice daily - Synjardy 5-500 twice daily - Carvedilol 6.25 mg twice daily  Poorly controlled  type 2 diabetes mellitus with neuropathy (HCC) Glucometer has gone missing from her house.  She has not been able to check her blood glucose levels.  Thus, she is not administering insulin.  This is a difficult situation in this person with uncontrolled  diabetes with complications.  Today, worked with Ms. Plyler to arrange professional CGM monitoring for the next 2 weeks.  Will get an A1c today, BMP, urine ACR.  Follow-up in 2 weeks to download CGM data.  Will also order a freestyle libre CGM.  Discontinuing insulin from the med list for now since she is not taking it and has no way to monitor blood sugar at home.  Anticipate restarting at follow-up after evaluating ambulatory blood glucose monitoring results.  For neuropathy, increase duloxetine.  Foot exam completed today. - Discontinue daily glargine - Continue Synjardy 5-500 mg twice daily - Professional CGM placed today, return in 2 weeks for follow-up and download of these results - Increase duloxetine to 40 mg twice daily  Mild cognitive impairment Scored 1 out of 5 on mini cog today, signals deficits in memory, visual-spatial, and abstraction.  No gross focal deficits apparent during visit, fluent speech without word finding difficulties, although thorough neurologic examination was not performed.  Prior evaluation for this included TSH and B12, both of which were normal.  Imaging done in 2023 during workup for stroke showed mild cerebral microvascular disease.  Her cognitive impairment is quite advanced for her age.  Wonder if some of this is due to underlying mood problems or languor in setting of her very unfortunate psychosocial situation.  In any rate, given her relatively young age, this warrants a thorough workup.  When she comes back for diabetes follow-up, I plan to do MoCA and potentially refer for formal cognitive evaluation.  Vaginal itching Has not been sexually active for over 2 years.  Consistent with candidal overgrowth.  Self swab obtained to rule out BV. - Fluconazole 150 mg once  Major depressive disorder, single episode, severe (HCC) Suspect pretty severe refractory depression.  Lots of unfortunate circumstances in the last year including deterioration of overall health,  housing insecurity, and family strife contributing.  Referred to LCSW Christen Butter.  Increased duloxetine today, primarily for neuropathy but with dual indication for depression.    Return in 2 weeks, for diabetes, blood pressure, neuropathy, exercise program.  Patient discussed with Dr. Carmela Hurt MD 05/29/2022, 2:09 PM  Pager: [phone]

## 2022-05-29 NOTE — Patient Outreach (Signed)
  Care Coordination   05/29/2022 Name: Ashley Vaughn MRN: [id number] DOB: 21-Apr-1965   Met with patient at the request of Norm Parcel, RD at Internal Medicine Clinic.  Patient is looking for information/resources on housing.  Message sent to Weston Settle, Care Guide of Managed Medicaid team to schedule patient with a social worker.  Patient listed as having BC/BS but she states she only has Medicaid. Jodelle Gross, RN, BSN, CCM Care Management Coordinator [place] Phone: [phone]/Fax: [phone]

## 2022-05-29 NOTE — Patient Instructions (Addendum)
Today we discussed pain in feet, diabetes, and vaginal itching.  For pain it feet, this is likely due to neuropathy. Start taking Duloxetine twice daily with morning and evening medications.  For vaginal itching, take one pill of fluconazole.  For diabetes, I will try to get a continuous glucose monitor.  Return in 2 weeks, for diabetes, blood pressure, neuropathy, exercise program.   I will call you with the results of the following laboratory test(s):  Lab Orders         BMP8+Anion Gap         Microalbumin / Creatinine Urine Ratio         POC Hbg A1C      Expect a call from the following department(s):  Referral Orders         Amb Referral To Provider Referral Exercise Program (P.R.E.P)         Ambulatory referral to Integrated Behavioral Health     Please call our clinic at [phone] Monday through Friday from 9 am to 4 pm if you have questions or concerns about your health. If after hours or on the weekend, call the main hospital number and ask for the Internal Medicine Resident On-Call. If you need medication refills, please notify your pharmacy one week in advance and they will send [place] a request.   Best, Marrianne Mood, MD [facility]

## 2022-05-29 NOTE — Assessment & Plan Note (Addendum)
Asymptomatic.  Euvolemic.  Borderline blood pressure today but no signs or symptoms of orthostatic hypotension.  No changes, continue GDMT.  Referral to exercise program sent, patient is amenable to participation. - Entresto 50 twice daily - Synjardy 5-500 twice daily - Carvedilol 6.25 mg twice daily

## 2022-05-29 NOTE — Assessment & Plan Note (Addendum)
Scored 1 out of 5 on mini cog today, signals deficits in memory, visual-spatial, and abstraction.  No gross focal deficits apparent during visit, fluent speech without word finding difficulties, although thorough neurologic examination was not performed.  Prior evaluation for this included TSH and B12, both of which were normal.  Imaging done in 2023 during workup for stroke showed mild cerebral microvascular disease.  Her cognitive impairment is quite advanced for her age.  Wonder if some of this is due to underlying mood problems or languor in setting of her very unfortunate psychosocial situation.  In any rate, given her relatively young age, this warrants a thorough workup.  When she comes back for diabetes follow-up, I plan to do MoCA and potentially refer for formal cognitive evaluation.

## 2022-05-29 NOTE — Assessment & Plan Note (Signed)
Has not been sexually active for over 2 years.  Consistent with candidal overgrowth.  Self swab obtained to rule out BV. - Fluconazole 150 mg once

## 2022-05-29 NOTE — Assessment & Plan Note (Signed)
Suspect pretty severe refractory depression.  Lots of unfortunate circumstances in the last year including deterioration of overall health, housing insecurity, and family strife contributing.  Referred to LCSW Christen Butter.  Increased duloxetine today, primarily for neuropathy but with dual indication for depression.

## 2022-05-29 NOTE — Progress Notes (Signed)
Documentation for Freestyle Libre Pro Continuous glucose monitoring Freestyle Libre Pro CGM sensor placed today. Patient was educated about wearing sensor, keeping food, activity and medication log and when to call office. Patient was educated about how to care for the sensor and not to have an MRI, CT or Diathermy while wearing the sensor. Follow up was arranged with the patient for 1 week.   Lot #: [id number] Serial #: [id number] Expiration Date: 10/19/22  Norm Parcel, RD 05/29/2022 11:33 AM.

## 2022-05-29 NOTE — Assessment & Plan Note (Addendum)
Glucometer has gone missing from her house.  She has not been able to check her blood glucose levels.  Thus, she is not administering insulin.  This is a difficult situation in this person with uncontrolled diabetes with complications.  Today, worked with Ms. Plyler to arrange professional CGM monitoring for the next 2 weeks.  Will get an A1c today, BMP, urine ACR.  Follow-up in 2 weeks to download CGM data.  Will also order a freestyle libre CGM.  Discontinuing insulin from the med list for now since she is not taking it and has no way to monitor blood sugar at home.  Anticipate restarting at follow-up after evaluating ambulatory blood glucose monitoring results.  For neuropathy, increase duloxetine.  Foot exam completed today. - Discontinue daily glargine - Continue Synjardy 5-500 mg twice daily - Professional CGM placed today, return in 2 weeks for follow-up and download of these results - Increase duloxetine to 40 mg twice daily

## 2022-05-29 NOTE — Patient Instructions (Addendum)
Please record the time, amount and what food drinks and activities you have while wearing the continuous glucose monitor (CGM).  Bring the folder with you to follow up appointments. If your monitor falls off, please place it in the bag provided in your folder and bring it back with you to your next appointment.   Do not have a CT or an MRI while wearing the CGM.     You will return in 2 weeks to have your sensor downloaded and the CGM removed.  [name] [phone]

## 2022-05-30 ENCOUNTER — Telehealth: Payer: Self-pay

## 2022-05-30 LAB — CERVICOVAGINAL ANCILLARY ONLY
Bacterial Vaginitis (gardnerella): NEGATIVE
Candida Glabrata: NEGATIVE
Candida Vaginitis: POSITIVE — AB
Comment: NEGATIVE
Comment: NEGATIVE
Comment: NEGATIVE

## 2022-05-30 LAB — BMP8+ANION GAP
Anion Gap: 16 mmol/L (ref 10.0–18.0)
BUN/Creatinine Ratio: 23 (ref 9–23)
BUN: 27 mg/dL — ABNORMAL HIGH (ref 6–24)
CO2: 18 mmol/L — ABNORMAL LOW (ref 20–29)
Calcium: 9.2 mg/dL (ref 8.7–10.2)
Chloride: 103 mmol/L (ref 96–106)
Creatinine, Ser: 1.19 mg/dL — ABNORMAL HIGH (ref 0.57–1.00)
Glucose: 273 mg/dL — ABNORMAL HIGH (ref 70–99)
Potassium: 4.5 mmol/L (ref 3.5–5.2)
Sodium: 137 mmol/L (ref 134–144)
eGFR: 54 mL/min/{1.73_m2} — ABNORMAL LOW (ref >59)

## 2022-05-30 LAB — MICROALBUMIN / CREATININE URINE RATIO
Creatinine, Urine: 114.5 mg/dL
Microalb/Creat Ratio: 1462 mg/g creat — ABNORMAL HIGH (ref 0–29)
Microalbumin, Urine: 1674.2 ug/mL

## 2022-05-30 MED ORDER — FREESTYLE LIBRE 3 READER DEVI: 1.0000 | 0 refills | Status: DC

## 2022-05-30 MED ORDER — FREESTYLE LIBRE 3 SENSOR MISC: 2 refills | Status: DC

## 2022-05-30 MED ORDER — DULOXETINE HCL 20 MG PO CPEP: 20.0000 mg | ORAL_CAPSULE | Freq: Two times a day (BID) | ORAL | 0 refills | Status: DC

## 2022-05-30 MED ORDER — FLUCONAZOLE 150 MG PO TABS: 150.0000 mg | ORAL_TABLET | Freq: Every day | ORAL | 0 refills | Status: DC

## 2022-05-30 NOTE — Telephone Encounter (Signed)
..   Medicaid Managed Care   Unsuccessful Outreach Note  05/30/2022 Name: Ashley Vaughn MRN: [id number] DOB: [birth year]  Referred by: Marrianne Mood, MD Reason for referral : Appointment   An unsuccessful telephone outreach was attempted today. The patient was referred to the case management team for assistance with care management and care coordination.   Follow Up Plan: A HIPAA compliant phone message was left for the patient providing contact information and requesting a return call.  The care management team will reach out to the patient again over the next 7 days.    Weston Settle Care Guide  [facility] Managed  [facility]  [phone]

## 2022-05-30 NOTE — Addendum Note (Signed)
Addended by: Marrianne Mood on: 05/30/2022 09:50 PM   Modules accepted: Orders

## 2022-05-30 NOTE — Telephone Encounter (Signed)
Patient called she is requesting her rx for Dexcom,Duloxetine and Diflucan to [facility] of [facility]. [facility] - [place], [place] - [address] [place], [place] [address] Phone: [phone]  Fax: [phone]

## 2022-05-31 ENCOUNTER — Telehealth: Payer: Self-pay

## 2022-05-31 NOTE — Telephone Encounter (Addendum)
Prior Authorization for patient (Freestyle Libre 3 Reader/Sensor) came through on [employer] and spoke to [place] at [employer] have been submitted.  Prior Authorization for [employer] 3 Reader has been approved effective 05/31/22-11/27/22 [id number]  Prior Authorization for [facility] 3 Sensor has been approved effective 05/31/22-11/27/22 [id number]  Approval has been faxed to the pharmacy.

## 2022-06-03 NOTE — Progress Notes (Signed)
Internal Medicine Clinic Attending  Case discussed with Dr. Benito Mccreedy  At the time of the visit.  We reviewed the resident's history and exam and pertinent patient test results.  I agree with the assessment, diagnosis, and plan of care documented in the resident's note. Commend Dr. Benito Mccreedy for recognizing and pursuing cognitive concerns.  Query whether her insurance company provides any transportation resources.  Note empagliflozin can predispose to GU infections.  NO reason to stop if this is an isolated episode.  Her UAC is rising and the empagliflozin is an important tool in the toolkit.

## 2022-06-04 ENCOUNTER — Telehealth: Payer: Self-pay

## 2022-06-04 NOTE — Telephone Encounter (Signed)
..   Medicaid Managed Care   Unsuccessful Outreach Note  06/04/2022 Name: Ashley Vaughn MRN: [id number] DOB: 01/30/1966  Referred by: Marrianne Mood, MD Reason for referral : Appointment   A second unsuccessful telephone outreach was attempted today. The patient was referred to the case management team for assistance with care management and care coordination.   Follow Up Plan: The care management team will reach out to the patient again over the next 7 days.   Weston Settle Care Guide  [facility] Managed  Care Guide [facility]  [phone]

## 2022-06-10 ENCOUNTER — Encounter: Payer: BLUE CROSS/BLUE SHIELD | Admitting: Student

## 2022-06-12 ENCOUNTER — Ambulatory Visit (INDEPENDENT_AMBULATORY_CARE_PROVIDER_SITE_OTHER): Payer: BLUE CROSS/BLUE SHIELD | Admitting: Student

## 2022-06-12 ENCOUNTER — Ambulatory Visit (HOSPITAL_COMMUNITY)
Admission: RE | Admit: 2022-06-12 | Discharge: 2022-06-12 | Disposition: A | Discharge status: Home / Self Care | Payer: BLUE CROSS/BLUE SHIELD | Source: Ambulatory Visit | Attending: Internal Medicine | Admitting: Internal Medicine

## 2022-06-12 ENCOUNTER — Encounter: Payer: Self-pay | Admitting: Student

## 2022-06-12 VITALS — BP 134/76 | HR 91 | Temp 97.7°F | Wt 124.4 lb

## 2022-06-12 DIAGNOSIS — E1165 Type 2 diabetes mellitus with hyperglycemia: Secondary | ICD-10-CM

## 2022-06-12 DIAGNOSIS — E1140 Type 2 diabetes mellitus with diabetic neuropathy, unspecified: Secondary | ICD-10-CM

## 2022-06-12 DIAGNOSIS — I499 Cardiac arrhythmia, unspecified: Secondary | ICD-10-CM | POA: Insufficient documentation

## 2022-06-12 DIAGNOSIS — R42 Dizziness and giddiness: Secondary | ICD-10-CM | POA: Insufficient documentation

## 2022-06-12 DIAGNOSIS — Z7984 Long term (current) use of oral hypoglycemic drugs: Secondary | ICD-10-CM

## 2022-06-12 DIAGNOSIS — I5020 Unspecified systolic (congestive) heart failure: Secondary | ICD-10-CM | POA: Diagnosis not present

## 2022-06-12 DIAGNOSIS — Z139 Encounter for screening, unspecified: Secondary | ICD-10-CM

## 2022-06-12 DIAGNOSIS — Z7985 Long-term (current) use of injectable non-insulin antidiabetic drugs: Secondary | ICD-10-CM

## 2022-06-12 MED ORDER — ACCU-CHEK SOFTCLIX LANCETS MISC: 3 refills | Status: DC

## 2022-06-12 MED ORDER — ACCU-CHEK GUIDE VI STRP
ORAL_STRIP | 12 refills | Status: DC
Start: 2022-06-12 — End: 2022-09-25

## 2022-06-12 MED ORDER — SYNJARDY 12.5-500 MG PO TABS
1.0000 | ORAL_TABLET | Freq: Two times a day (BID) | ORAL | 3 refills | Status: DC
Start: 2022-06-12 — End: 2023-01-23

## 2022-06-12 MED ORDER — DULOXETINE HCL 40 MG PO CPEP
40.0000 mg | ORAL_CAPSULE | Freq: Every day | ORAL | 11 refills | Status: DC
Start: 2022-06-12 — End: 2022-09-10

## 2022-06-12 MED ORDER — SEMAGLUTIDE(0.25 OR 0.5MG/DOS) 2 MG/3ML ~~LOC~~ SOPN
0.2500 mg | PEN_INJECTOR | SUBCUTANEOUS | 3 refills | Status: DC
Start: 2022-06-12 — End: 2023-12-04

## 2022-06-12 MED ORDER — ACCU-CHEK GUIDE W/DEVICE KIT
PACK | 3 refills | Status: DC
Start: 2022-06-12 — End: 2022-09-10

## 2022-06-12 MED ORDER — CARVEDILOL 3.125 MG PO TABS
3.1250 mg | ORAL_TABLET | Freq: Two times a day (BID) | ORAL | 3 refills | Status: DC
Start: 2022-06-12 — End: 2023-12-04

## 2022-06-12 NOTE — Assessment & Plan Note (Addendum)
Severe food and housing insecurity complicating management of chronic conditions including heart failure and diabetes.  Repeat referral to social work today for help with local food and housing resources.  Invited her to explore this clinic's pantry for food to take home.

## 2022-06-12 NOTE — Progress Notes (Signed)
Subjective:  [name] is a 57 y.o. who presents to clinic for the following:  Diabetes (Follow up/CGM "fell off"/) and Insect Bite (Pt thinks something may be biting her at night  (sleeps on the sofa)/)  Follow-up for diabetes, also sleep difficulties and dizziness.  CGM on until this morning when it fell off as she was scratching her arm.  Reports lots of sleep difficulties, usually does not fall asleep until around 6 to 8 AM and then sleeps until around 12 to 3 PM.  Sleeps on a couch and her daughter's house.  She suspects this Is infested with bedbugs, although no one else in the house suffers bites.  Also has some dizziness on standing that has worsened recently.  Objective:   Vitals:   06/12/22 1000  BP: 134/76  Pulse: 91  Temp: 97.7 F (36.5 C)  TempSrc: Oral  SpO2: 100%  Weight: 124 lb 6.4 oz (56.4 kg)    Physical Exam No acute distress Heart rate normal, rhythm is irregularly irregular, no appreciable murmurs, strong radial pulses, no lower extremity edema Breathing is regular and unlabored on room air Skin is warm and dry Alert and oriented Pleasant, concordant affect  Assessment & Plan:   Encounter for screening involving social determinants of health (SDoH) Severe food and housing insecurity complicating management of chronic conditions including heart failure and diabetes.  Repeat referral to social work today for help with local food and housing resources.  Invited her to explore this clinic's pantry for food to take home.  HFrEF (heart failure with reduced ejection fraction) (HCC) Euvolemic on exam.  No major changes to management.  Will increase SGLT2 for diabetes.  Poorly controlled type 2 diabetes mellitus with neuropathy (HCC) Ashley Vaughn wore the CGM for 14 days. The average reading was 300, % time in target was 35, % time below target was 2, and % time above target was. 63. Intervention will be to increase SGLT-2 inhibitor and add GLP-1  agonist.   CGM interpretation above.  She did well wearing the device.  Pattern includes a serum glucose nadir around 12 PM, suspecting that this is when she is sleeping.  She has high blood sugar overnight, and acknowledges that she is often up at night eating when food is available.  Concerning that there are days when her blood sugar is almost entirely in target or even the low, corresponding to days when she has very little food to eat.  Will order glucometer today, hopeful that she can check CBG at home.  Increase SGLT2 inhibitor.  If she can demonstrate consistency with checking blood glucose at home, can restart some long-acting insulin but for now risks outweigh benefits.  Add low-dose GLP-1 agonist today. - Increase empagliflozin/metformin to 12.5/500 mg twice daily - Start Ozempic 0.25 mg weekly   Irregular heartbeat Noted on exam.  Currently asymptomatic.  EKG shows sinus arrhythmia, no A-fib or apparent ectopy.  No intervention for this.  Orthostatic dizziness Recent history of dizziness on standing.  She wonders if these are related to medication side effects.  She is well-appearing on exam and her blood pressure is good today.  Do not think her sinus arrhythmia is the cause of this.  She is a woman of slight build on several medications that could cause this problem.  Will down titrate duloxetine and carvedilol today.    Return in a couple of weeks, for diabetes.  Patient discussed with Dr. Mal Amabile  Ashley Vold MD 06/12/2022, 1:26 PM  Pager: [phone]

## 2022-06-12 NOTE — Patient Instructions (Signed)
This after visit summary is an important review of tests, referrals, and medication changes that were discussed during your visit. If you have questions or concerns, call [phone]. Outside of clinic business hours, call the main hospital at [phone] and ask the operator for the on-call internal medicine resident.   Ernesta Amble MD 06/12/2022, 11:22 AM

## 2022-06-12 NOTE — Assessment & Plan Note (Signed)
Euvolemic on exam.  No major changes to management.  Will increase SGLT2 for diabetes.

## 2022-06-12 NOTE — Assessment & Plan Note (Signed)
Noted on exam.  Currently asymptomatic.  EKG shows sinus arrhythmia, no A-fib or apparent ectopy.  No intervention for this.

## 2022-06-12 NOTE — Assessment & Plan Note (Signed)
Recent history of dizziness on standing.  She wonders if these are related to medication side effects.  She is well-appearing on exam and her blood pressure is good today.  Do not think her sinus arrhythmia is the cause of this.  She is a woman of slight build on several medications that could cause this problem.  Will down titrate duloxetine and carvedilol today.

## 2022-06-12 NOTE — Assessment & Plan Note (Addendum)
Rhona Raider wore the CGM for 14 days. The average reading was 300, % time in target was 35, % time below target was 2, and % time above target was. 63. Intervention will be to increase SGLT-2 inhibitor and add GLP-1 agonist.   CGM interpretation above.  She did well wearing the device.  Pattern includes a serum glucose nadir around 12 PM, suspecting that this is when she is sleeping.  She has high blood sugar overnight, and acknowledges that she is often up at night eating when food is available.  Concerning that there are days when her blood sugar is almost entirely in target or even the low, corresponding to days when she has very little food to eat.  Will order glucometer today, hopeful that she can check CBG at home.  Increase SGLT2 inhibitor.  If she can demonstrate consistency with checking blood glucose at home, can restart some long-acting insulin but for now risks outweigh benefits.  Add low-dose GLP-1 agonist today. - Increase empagliflozin/metformin to 12.5/500 mg twice daily - Start Ozempic 0.25 mg weekly

## 2022-06-13 NOTE — Progress Notes (Signed)
Internal Medicine Clinic Attending  Case discussed with Dr. Benito Mccreedy  at the time of the visit.  We reviewed the resident's history and exam and pertinent patient test results.  I agree with the assessment, diagnosis, and plan of care documented in the resident's note. Several medication adjustments today in the setting of orthostatic dizziness. Uncontrolled DM may also be contributing, insulin not thought to be safe at this time given variable BG in the setting of food insecurity. Will need to be cautious with increased dose of SGLT2i to not worsen orthostatic symptoms and Dr. Benito Mccreedy to discuss increased hydration with [name].

## 2022-06-24 ENCOUNTER — Ambulatory Visit (INDEPENDENT_AMBULATORY_CARE_PROVIDER_SITE_OTHER): Payer: BLUE CROSS/BLUE SHIELD | Admitting: Licensed Clinical Social Worker

## 2022-06-24 DIAGNOSIS — F322 Major depressive disorder, single episode, severe without psychotic features: Secondary | ICD-10-CM

## 2022-06-24 NOTE — Progress Notes (Signed)
Documentation for removal of professional CGM sensor (it fell off) download of 14 day report.  Norm Parcel, RD 06/24/2022 12:20 PM.

## 2022-06-24 NOTE — BH Specialist Note (Addendum)
Patient no-showed today's appointment; appointment was for Telephone visit at 1:30 pm  Behavioral Health Clinician [facility] from here on out)  attempted patient  via Telephone number [phone]   [facility] contacted patient from telephone number [phone]. [facility] left a  VM.   Patient will need to reschedule appointment by calling Internal medicine center [phone].  Update at 3:42 Pm: Patient contacted office back stating she didn't receive a call. Patient reschedule appointment for in-person with front desk.   Christen Butter, MSW, LCSW-A She/Her Behavioral Health Clinician [facility]  Internal Medicine Center Direct Dial:[phone]  Fax [phone] Main Office Phone: [phone] [address] [place] [place], [place] [address] Website: [facility] Internal Medicine [facility]  [place], [place]  [place]

## 2022-07-03 ENCOUNTER — Ambulatory Visit (INDEPENDENT_AMBULATORY_CARE_PROVIDER_SITE_OTHER): Payer: BLUE CROSS/BLUE SHIELD | Admitting: Licensed Clinical Social Worker

## 2022-07-03 ENCOUNTER — Encounter: Payer: Self-pay | Admitting: Licensed Clinical Social Worker

## 2022-07-03 DIAGNOSIS — F322 Major depressive disorder, single episode, severe without psychotic features: Secondary | ICD-10-CM

## 2022-07-03 NOTE — Progress Notes (Signed)
.  nosho

## 2022-07-03 NOTE — Progress Notes (Signed)
Patient now showed for appointment.  [facility] contacted patient as courtesy. Patient stated she forgot about appointment.   Patient agreed to switch appointment to telephone visit for remaining time. During conversation, call was lost and [facility] was unable to  get through with further phone call attempts. .   Patient will need to reschedule appointment by calling Internal medicine center [phone].  Christen Butter, MSW, LCSW-A She/Her Behavioral Health Clinician [facility]  Internal Medicine Center Direct Dial:[phone]  Fax [phone] Main Office Phone: [phone] [address] [place] [place], [place] [address] Website: [facility] Internal Medicine [facility]  [place], [place]  [place]

## 2022-07-08 ENCOUNTER — Telehealth: Payer: Self-pay

## 2022-07-08 NOTE — Telephone Encounter (Signed)
A Prior Authorization was initiated for this patients OZEMPIC through CoverMyMeds.   Key: [id number]

## 2022-07-10 NOTE — Telephone Encounter (Signed)
Prior Auth for patients medication OZEMPIC approved by [facility] from 07/08/22 to 07/08/23.  CoverMyMeds Key: [id number]

## 2022-07-11 NOTE — BH Specialist Note (Signed)
Patient no-showed today's appointment; appointment was for Telephone visit at 3:30 pm    Patient will need to reschedule appointment by calling Internal medicine center [phone].  Braylee Lal, MSW, LCSW-A She/Her Behavioral Health Clinician [place]  Internal Medicine Center Direct Dial:[phone]  Fax [phone] Main Office Phone: [phone] [address] [place], [place] [address] Website: [facility]  Primary Care  [place], [place]  [place]    

## 2022-07-12 ENCOUNTER — Emergency Department (HOSPITAL_COMMUNITY): Payer: BLUE CROSS/BLUE SHIELD

## 2022-07-12 ENCOUNTER — Other Ambulatory Visit: Payer: Self-pay

## 2022-07-12 ENCOUNTER — Encounter (HOSPITAL_COMMUNITY): Payer: Self-pay

## 2022-07-12 ENCOUNTER — Emergency Department (HOSPITAL_COMMUNITY)
Admission: EM | Admit: 2022-07-12 | Discharge: 2022-07-12 | Disposition: A | Discharge status: Home / Self Care | Payer: BLUE CROSS/BLUE SHIELD | Attending: Emergency Medicine | Admitting: Emergency Medicine

## 2022-07-12 DIAGNOSIS — R42 Dizziness and giddiness: Secondary | ICD-10-CM | POA: Diagnosis present

## 2022-07-12 DIAGNOSIS — Z21 Asymptomatic human immunodeficiency virus [HIV] infection status: Secondary | ICD-10-CM | POA: Insufficient documentation

## 2022-07-12 DIAGNOSIS — E860 Dehydration: Secondary | ICD-10-CM | POA: Insufficient documentation

## 2022-07-12 DIAGNOSIS — I110 Hypertensive heart disease with heart failure: Secondary | ICD-10-CM | POA: Insufficient documentation

## 2022-07-12 DIAGNOSIS — E119 Type 2 diabetes mellitus without complications: Secondary | ICD-10-CM | POA: Insufficient documentation

## 2022-07-12 DIAGNOSIS — Z7902 Long term (current) use of antithrombotics/antiplatelets: Secondary | ICD-10-CM | POA: Diagnosis not present

## 2022-07-12 DIAGNOSIS — I509 Heart failure, unspecified: Secondary | ICD-10-CM | POA: Insufficient documentation

## 2022-07-12 DIAGNOSIS — Z79899 Other long term (current) drug therapy: Secondary | ICD-10-CM | POA: Insufficient documentation

## 2022-07-12 LAB — BASIC METABOLIC PANEL
Anion gap: 8 (ref 5–15)
BUN: 22 mg/dL — ABNORMAL HIGH (ref 6–20)
CO2: 23 mmol/L (ref 22–32)
Calcium: 9.3 mg/dL (ref 8.9–10.3)
Chloride: 104 mmol/L (ref 98–111)
Creatinine, Ser: 1.24 mg/dL — ABNORMAL HIGH (ref 0.44–1.00)
GFR, Estimated: 51 mL/min — ABNORMAL LOW (ref >60)
Glucose, Bld: 342 mg/dL — ABNORMAL HIGH (ref 70–99)
Potassium: 4.2 mmol/L (ref 3.5–5.1)
Sodium: 135 mmol/L (ref 135–145)

## 2022-07-12 LAB — URINALYSIS, ROUTINE W REFLEX MICROSCOPIC
Bilirubin Urine: NEGATIVE
Glucose, UA: >=500 mg/dL — AB
Hgb urine dipstick: NEGATIVE
Ketones, ur: NEGATIVE mg/dL
Nitrite: NEGATIVE
Protein, ur: >=300 mg/dL — AB
Specific Gravity, Urine: 1.016 (ref 1.005–1.030)
pH: 7 (ref 5.0–8.0)

## 2022-07-12 LAB — CBC
HCT: 36.2 % (ref 36.0–46.0)
Hemoglobin: 11.1 g/dL — ABNORMAL LOW (ref 12.0–15.0)
MCH: 21 pg — ABNORMAL LOW (ref 26.0–34.0)
MCHC: 30.7 g/dL (ref 30.0–36.0)
MCV: 68.4 fL — ABNORMAL LOW (ref 80.0–100.0)
Platelets: 332 10*3/uL (ref 150–400)
RBC: 5.29 MIL/uL — ABNORMAL HIGH (ref 3.87–5.11)
RDW: 15.4 % (ref 11.5–15.5)
WBC: 6.6 10*3/uL (ref 4.0–10.5)
nRBC: 0 % (ref 0.0–0.2)

## 2022-07-12 LAB — CBG MONITORING, ED
Glucose-Capillary: 328 mg/dL — ABNORMAL HIGH (ref 70–99)
Glucose-Capillary: 216 mg/dL — ABNORMAL HIGH (ref 70–99)

## 2022-07-12 MED ORDER — SODIUM CHLORIDE 0.9 % IV BOLUS: 500.0000 mL | Freq: Once | INTRAVENOUS | Status: DC

## 2022-07-12 NOTE — ED Provider Notes (Signed)
[place] EMERGENCY DEPARTMENT AT [facility] Provider Note   CSN: [id number] Arrival date & time: 07/12/22  1158     History Chief Complaint  Patient presents with   Dizziness    CASHE GERLITZ is a 57 y.o. female.  Patient with past history significant for CHF, type 2 diabetes, hypertension, mild cognitive impairment, HIV presents to the emergency department complaints of dizziness.  She reports that she woke up this morning feeling increased dizziness and feeling general fatigue.  Denies any obvious or known sick contacts.  Does believe that she may not be eating or drinking as well as she has not been feeling well for the last 2 days.  Reports he still able to urinate without difficulty.  Reports that urine is still largely clear.   Dizziness      Home Medications Prior to Admission medications   Medication Sig Start Date End Date Taking? Authorizing Provider  Accu-Chek Softclix Lancets lancets Check blood sugar 3 times per day 06/12/22   Marrianne Mood, MD  bictegravir-emtricitabine-tenofovir AF (BIKTARVY) 50-200-25 MG TABS tablet Take 1 tablet by mouth daily. 12/21/21   Blanchard Kelch, NP  Blood Glucose Monitoring Suppl (ACCU-CHEK GUIDE) w/Device KIT To monitor blood sugar, use in the morning before first meal of the day. 06/12/22   Marrianne Mood, MD  carvedilol (COREG) 3.125 MG tablet Take 1 tablet (3.125 mg total) by mouth 2 (two) times daily with a meal. 06/12/22   Marrianne Mood, MD  clopidogrel (PLAVIX) 75 MG tablet Take 1 tablet (75 mg total) by mouth daily. 09/27/21   Reymundo Poll, MD  DULoxetine HCl 40 MG CPEP Take 1 capsule (40 mg total) by mouth daily with breakfast. 06/12/22   Marrianne Mood, MD  Empagliflozin-metFORMIN HCl (SYNJARDY) 12.5-500 MG TABS Take 1 tablet by mouth 2 (two) times daily. 06/12/22   Marrianne Mood, MD  glucose blood (ACCU-CHEK GUIDE) test strip Use as instructed 06/12/22   Marrianne Mood, MD  rosuvastatin (CRESTOR)  20 MG tablet Take 1 tablet (20 mg total) by mouth daily. 11/01/21   Sabharwal, Aditya, DO  sacubitril-valsartan (ENTRESTO) 24-26 MG Take 1 tablet by mouth 2 (two) times daily. 11/20/21   Sabharwal, Aditya, DO  Semaglutide,0.25 or 0.5MG /DOS, 2 MG/3ML SOPN Inject 0.25 mg into the skin once a week. 06/12/22   Marrianne Mood, MD      Allergies    Metformin and related and Fluorescein    Review of Systems   Review of Systems  Neurological:  Positive for dizziness.  All other systems reviewed and are negative.   Physical Exam Updated Vital Signs BP (!) 144/95 (BP Location: Right Arm)   Pulse 82   Temp (!) 97.4 F (36.3 C) (Oral)   Resp 16   Ht 5\' 2"  (1.575 m)   Wt 55.8 kg   SpO2 100%   BMI 22.50 kg/m  Physical Exam Vitals and nursing note reviewed.  Constitutional:      General: She is not in acute distress.    Appearance: She is well-developed.  HENT:     Head: Normocephalic and atraumatic.  Eyes:     Conjunctiva/sclera: Conjunctivae normal.  Cardiovascular:     Rate and Rhythm: Normal rate and regular rhythm.     Heart sounds: No murmur heard. Pulmonary:     Effort: Pulmonary effort is normal. No respiratory distress.     Breath sounds: Normal breath sounds.  Abdominal:     Palpations: Abdomen is soft.  Tenderness: There is no abdominal tenderness.  Musculoskeletal:        General: No swelling.     Cervical back: Neck supple.  Skin:    General: Skin is warm and dry.     Capillary Refill: Capillary refill takes less than 2 seconds.  Neurological:     General: No focal deficit present.     Mental Status: She is alert.     Cranial Nerves: No cranial nerve deficit.     Comments: CN III-XII intact  Psychiatric:        Mood and Affect: Mood normal.     ED Results / Procedures / Treatments   Labs (all labs ordered are listed, but only abnormal results are displayed) Labs Reviewed  BASIC METABOLIC PANEL - Abnormal; Notable for the following components:       Result Value   Glucose, Bld 342 (*)    BUN 22 (*)    Creatinine, Ser 1.24 (*)    GFR, Estimated 51 (*)    All other components within normal limits  CBC - Abnormal; Notable for the following components:   RBC 5.29 (*)    Hemoglobin 11.1 (*)    MCV 68.4 (*)    MCH 21.0 (*)    All other components within normal limits  URINALYSIS, ROUTINE W REFLEX MICROSCOPIC - Abnormal; Notable for the following components:   APPearance HAZY (*)    Glucose, UA >=500 (*)    Protein, ur >=300 (*)    Leukocytes,Ua SMALL (*)    Bacteria, UA RARE (*)    Non Squamous Epithelial 0-5 (*)    All other components within normal limits  CBG MONITORING, ED - Abnormal; Notable for the following components:   Glucose-Capillary 328 (*)    All other components within normal limits  CBG MONITORING, ED - Abnormal; Notable for the following components:   Glucose-Capillary 216 (*)    All other components within normal limits    EKG EKG Interpretation  Date/Time:  Friday Jul 12 2022 12:12:55 EDT Ventricular Rate:  90 PR Interval:  130 QRS Duration: 72 QT Interval:  374 QTC Calculation: 457 R Axis:   58 Text Interpretation: Normal sinus rhythm with sinus arrhythmia Minimal voltage criteria for LVH, may be normal variant ( Sokolow-Lyon ) Nonspecific T wave abnormality  no significant change since April 2024 Confirmed by Pricilla Loveless [phone]) on 07/12/2022 5:27:32 PM  Radiology CT Head Wo Contrast  Result Date: 07/12/2022 CLINICAL DATA:  Dizziness. EXAM: CT HEAD WITHOUT CONTRAST TECHNIQUE: Contiguous axial images were obtained from the base of the skull through the vertex without intravenous contrast. RADIATION DOSE REDUCTION: This exam was performed according to the departmental dose-optimization program which includes automated exposure control, adjustment of the mA and/or kV according to patient size and/or use of iterative reconstruction technique. COMPARISON:  July 19, 2021 FINDINGS: Brain: No evidence of acute  infarction, hemorrhage, hydrocephalus, extra-axial collection or mass lesion/mass effect. Vascular: No hyperdense vessel or unexpected calcification. Skull: Normal. Negative for fracture or focal lesion. Sinuses/Orbits: Mild right ethmoid sinus mucosal thickening is noted. Other: None. IMPRESSION: No acute intracranial pathology. Electronically Signed   By: Aram Candela M.D.   On: 07/12/2022 17:24    Procedures Procedures   Medications Ordered in ED Medications  sodium chloride 0.9 % bolus 500 mL (has no administration in time range)    ED Course/ Medical Decision Making/ A&P  Medical Decision Making Amount and/or Complexity of Data Reviewed Labs: ordered. Radiology: ordered.   This patient presents to the ED for concern of dizziness.  Differential diagnosis includes fall, dehydration, altered mental status, stroke, hypoglycemia  Lab Tests:  I Ordered, and personally interpreted labs.  The pertinent results include: CBC largely unremarkable, BMP with signs of mild dehydration with elevated BUN and creatinine declining GFR, UA with signs of potential infection but patient asymptomatic   Imaging Studies ordered:  I ordered imaging studies including CT head I independently visualized and interpreted imaging which showed no acute intracranial abnormalities I agree with the radiologist interpretation   Medicines ordered and prescription drug management:  I ordered medication including fluids for dehydration Reevaluation of the patient after these medicines showed that the patient unchanged.  Staff unable to get IV line started and patient so IV fluids were not administered. I have reviewed the patients home medicines and have made adjustments as needed   Problem List / ED Course:  Patient presents emergency department with complaints of dizziness.  She reports that she woke up this morning feeling increased dizziness and has had some improvement in  symptoms throughout the course of the day.  She denies any nausea, vomiting, diarrhea.  Does report that she has had some decreased appetite and has not been drinking liquids as often.  Oxygen for some level of dehydration at this time.  Lab workup initiated including CBC, BMP, CBG, UA. CBC unremarkable, BMP with some signs of dehydration with elevated BUN and creatinine compared to baseline.  UA without signs of obvious infection and patient currently asymptomatic.  Given normal lab workup, CT head ordered.  CT findings negative for any acute intracranial abnormality. Informed patient of these findings.  Will trial p.o. challenge as this IV was unsuccessful and cannot provide patient with fluid resuscitation. Patient tolerated p.o. challenge without significant difficulties.  Patient can ambulate without difficulty.  I believe the patient is currently stable for discharge home.  Advised patient to follow-up primary care provider return to the emergency department if acutely worsening.  Patient agreeable to treatment plan verbalized understanding all return precautions.   Final Clinical Impression(s) / ED Diagnoses Final diagnoses:  Dizziness    Rx / DC Orders ED Discharge Orders     None         Salomon Mast 07/12/22 2045    Linwood Dibbles, MD 07/13/22 [phone]

## 2022-07-12 NOTE — ED Triage Notes (Signed)
Pt c/o lightheadedness since waking up this morning. Pt denies N/V/D. Pt states she has had a decreased appetite "for a while". Denies pain. Denies SHOB.

## 2022-07-12 NOTE — Discharge Instructions (Addendum)
You were seen in the emergency department for dizziness. Thankfully your labs and imaging were all reassuring. I am not sure what is causing your dizziness. Please return to the ER if your symptoms are worsening. Otherwise follow up with your primary care provider.

## 2022-07-12 NOTE — ED Notes (Signed)
Pt able to tolerate [place] sandwich and ginger ale for PO challenge.  Provider notified.

## 2022-07-13 IMAGING — CT CT ANGIO HEAD-NECK (W OR W/O PERF)
2 of 8 series · 6 of 33 positions shown · non-contrast
Comparison: Brain MRI 0667 hours today.  Head CT yesterday.
COMPARISON: Brain MRI 0667 hours today.  Head CT yesterday.

Addendum:
CLINICAL DATA: 56-year-old female with several small infarcts
scattered in the right cerebral hemisphere on MRI earlier today.
History of HIV.

EXAM:
CT ANGIOGRAPHY HEAD AND NECK
TECHNIQUE: Multidetector CT imaging of the head and neck was performed using
the standard protocol during bolus administration of intravenous
contrast. Multiplanar CT image reconstructions and MIPs were
obtained to evaluate the vascular anatomy. Carotid stenosis
measurements (when applicable) are obtained utilizing NASCET
criteria, using the distal internal carotid diameter as the
denominator.

[Series 5: cta neck · axial · 0.46mm/px · z∈[-138,-32]mm · 2 of 161 slices shown]
[im 54/161  soft-tissue]
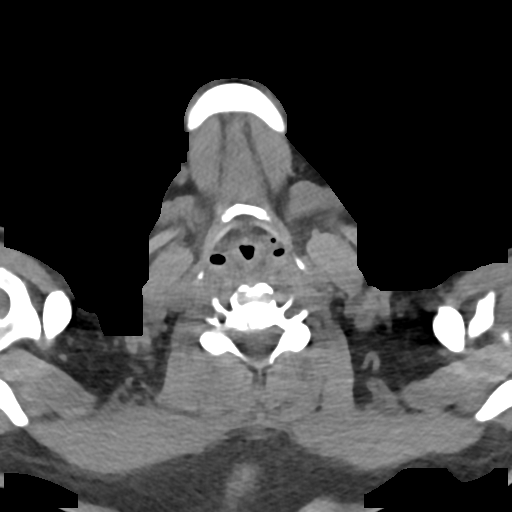
[im 107/161  soft-tissue]
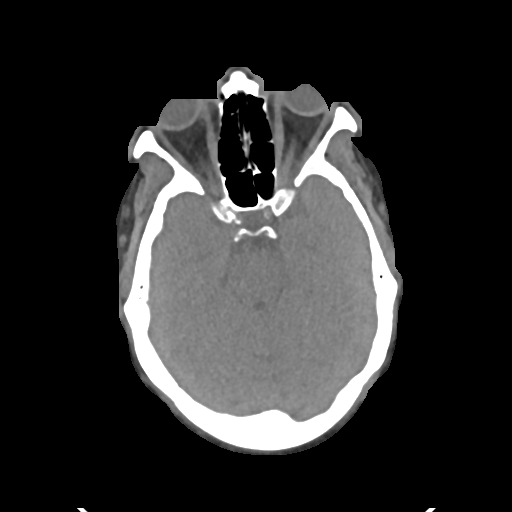

[Series 12: cta neck axial · axial · 0.43mm/px · z∈[-142,+45]mm · 4 of 313 slices shown]
[im 63/313  soft-tissue]
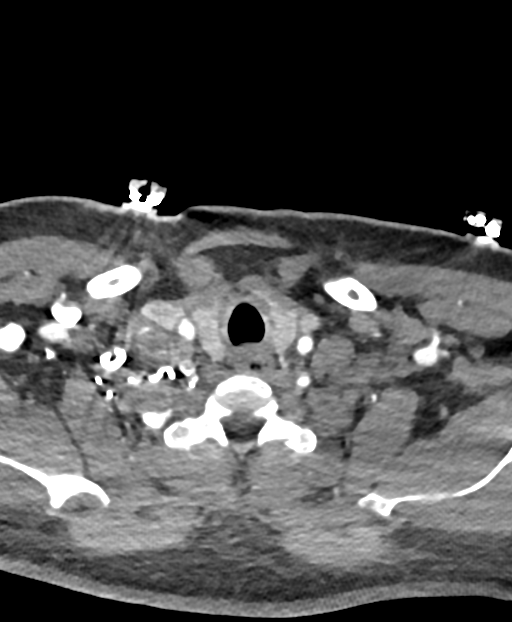
[im 125/313  bone]
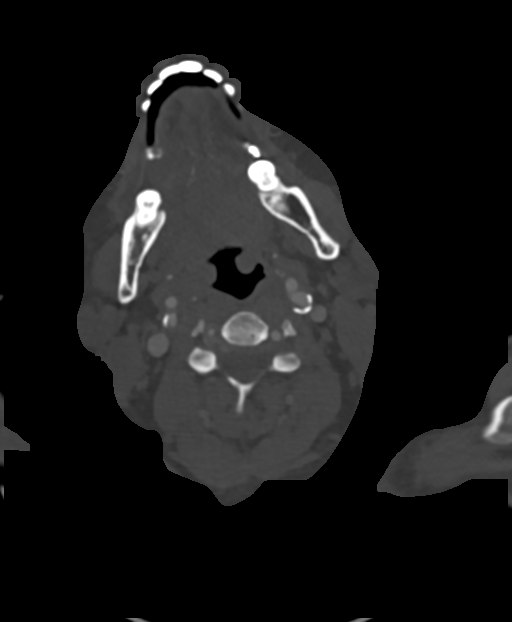
[im 188/313  soft-tissue]
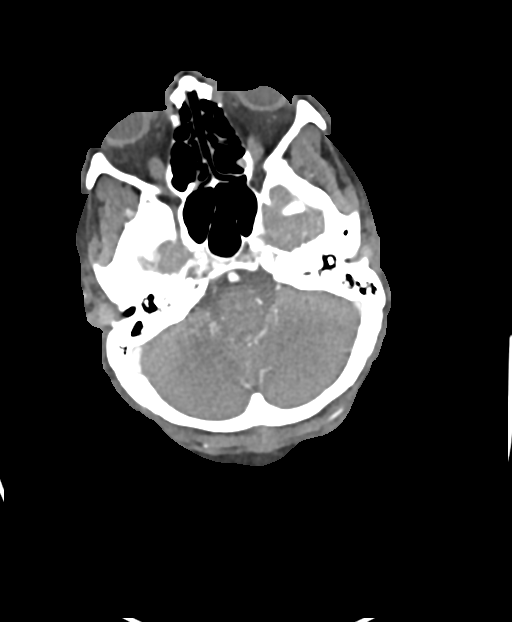
[im 250/313  bone]
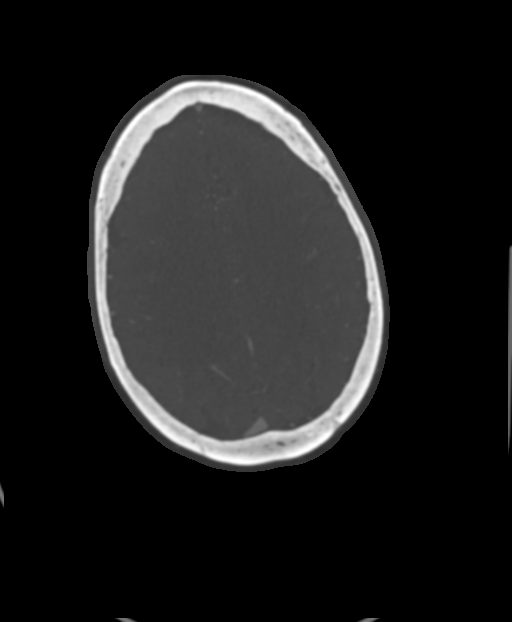

[6 of 33 positions shown; findings below may reference images not displayed]

RADIATION DOSE REDUCTION: This exam was performed according to the
departmental dose-optimization program which includes automated
exposure control, adjustment of the mA and/or kV according to
patient size and/or use of iterative reconstruction technique.

CONTRAST:  50mL OMNIPAQUE IOHEXOL 350 MG/ML SOLN
FINDINGS: Initial imaging attempt at 4001 hours with failed contrast bolus
reportedly due to peripheral IV infiltration. The study was repeated
with new IV access at 4243 hours.

CTA NECK

Skeleton: No acute osseous abnormality identified.

Upper chest: Mild dependent atelectasis. Partially visible
nonspecific mediastinal lymphadenopathy, increasing toward the level
of the carina.

Other neck: Negative; no neck mass or cervical lymphadenopathy
identified.

Aortic arch: Mild Calcified aortic atherosclerosis. 3 vessel arch
configuration.

Right carotid system: Tortuous right CCA origin without plaque or
stenosis. Moderate soft and calcified plaque at the right ICA origin
with 60 % stenosis with respect to the distal vessel. Right ICA
remains patent to the skull base.

Left carotid system: Negative left CCA aside from mild tortuosity.
Moderate soft and calcified plaque at the left ICA origin without
stenosis.

Vertebral arteries:
Negative proximal right subclavian artery. Normal right vertebral
artery origin. Some of the right V1 segment is mildly obscured from
venous contrast. But the right vertebral artery remains patent to
the skull base with no significant stenosis identified.

Negative proximal left subclavian artery. Calcified plaque at the
left vertebral artery origin but no associated stenosis. Dominant
appearing left vertebral artery is patent to the skull base without
stenosis.

CTA HEAD

Posterior circulation: Dominant left V4 segment. No distal vertebral
or vertebrobasilar junction plaque or stenosis. Normal PICA origins.
Patent basilar artery without stenosis. Patent SCA and PCA origins.
Posterior communicating arteries are diminutive or absent. Bilateral
PCA branches are within normal limits.

Anterior circulation: Patent ICA siphons but ICA tortuosity and
calcified plaque bilaterally. On the right side there is a
questionable filling defect in the supraclinoid ICA on series 12,
image 113 with up to moderate stenosis there. Right ICA terminus,
right MCA and ACA origin remain patent. On the left side there is
moderate stenosis in the distal cavernous segment with patent left
ICA terminus. Mild irregularity at the left MCA and ACA origins but
no significant stenosis (series 16 image 20).

Diminutive or absent anterior communicating artery. Bilateral ACA
branches are within normal limits. Left MCA M1 segment and
bifurcation are patent without stenosis. Left MCA branches are
within normal limits. Right MCA M1 segment and bifurcation are
patent without stenosis. Right MCA branches are within normal
limits.

Venous sinuses: Patent.

Anatomic variants: Dominant left vertebral artery.

Review of the MIP images confirms the above findings
IMPRESSION: 1. Negative for large vessel occlusion. But positive for
questionable filling defect - nonocclusive thrombus in the
supraclinoid Right ICA (series 10, image 58).

2. Underlying age advanced atherosclerosis at both carotid
bifurcations and ICA siphons. 60% Right ICA origin stenosis.
Moderate stenosis of the Left ICA cavernous segment.

3. Mild irregularity at the left MCA and ACA origins without
significant stenosis. No posterior circulation stenosis.

4. Partially visible mediastinal lymphadenopathy, nonspecific but
perhaps due to HIV related lymphoproliferative disorder in this
setting. Recommend follow-up Chest CT with IV contrast.

5. Aortic Atherosclerosis (4AFH2-FU9.9).

ADDENDUM:
Study discussed by telephone with Ronge. GARIPI and [employer] on 07/19/2021
at 0814 hours.

*** End of Addendum ***
RADIATION DOSE REDUCTION: This exam was performed according to the
departmental dose-optimization program which includes automated
exposure control, adjustment of the mA and/or kV according to
patient size and/or use of iterative reconstruction technique.

CONTRAST:  50mL OMNIPAQUE IOHEXOL 350 MG/ML SOLN
FINDINGS: Initial imaging attempt at 4001 hours with failed contrast bolus
reportedly due to peripheral IV infiltration. The study was repeated
with new IV access at 4243 hours.

CTA NECK

Skeleton: No acute osseous abnormality identified.

Upper chest: Mild dependent atelectasis. Partially visible
nonspecific mediastinal lymphadenopathy, increasing toward the level
of the carina.

Other neck: Negative; no neck mass or cervical lymphadenopathy
identified.

Aortic arch: Mild Calcified aortic atherosclerosis. 3 vessel arch
configuration.

Right carotid system: Tortuous right CCA origin without plaque or
stenosis. Moderate soft and calcified plaque at the right ICA origin
with 60 % stenosis with respect to the distal vessel. Right ICA
remains patent to the skull base.

Left carotid system: Negative left CCA aside from mild tortuosity.
Moderate soft and calcified plaque at the left ICA origin without
stenosis.

Vertebral arteries:
Negative proximal right subclavian artery. Normal right vertebral
artery origin. Some of the right V1 segment is mildly obscured from
venous contrast. But the right vertebral artery remains patent to
the skull base with no significant stenosis identified.

Negative proximal left subclavian artery. Calcified plaque at the
left vertebral artery origin but no associated stenosis. Dominant
appearing left vertebral artery is patent to the skull base without
stenosis.

CTA HEAD

Posterior circulation: Dominant left V4 segment. No distal vertebral
or vertebrobasilar junction plaque or stenosis. Normal PICA origins.
Patent basilar artery without stenosis. Patent SCA and PCA origins.
Posterior communicating arteries are diminutive or absent. Bilateral
PCA branches are within normal limits.

Anterior circulation: Patent ICA siphons but ICA tortuosity and
calcified plaque bilaterally. On the right side there is a
questionable filling defect in the supraclinoid ICA on series 12,
image 113 with up to moderate stenosis there. Right ICA terminus,
right MCA and ACA origin remain patent. On the left side there is
moderate stenosis in the distal cavernous segment with patent left
ICA terminus. Mild irregularity at the left MCA and ACA origins but
no significant stenosis (series 16 image 20).

Diminutive or absent anterior communicating artery. Bilateral ACA
branches are within normal limits. Left MCA M1 segment and
bifurcation are patent without stenosis. Left MCA branches are
within normal limits. Right MCA M1 segment and bifurcation are
patent without stenosis. Right MCA branches are within normal
limits.

Venous sinuses: Patent.

Anatomic variants: Dominant left vertebral artery.

Review of the MIP images confirms the above findings
IMPRESSION: 1. Negative for large vessel occlusion. But positive for
questionable filling defect - nonocclusive thrombus in the
supraclinoid Right ICA (series 10, image 58).

2. Underlying age advanced atherosclerosis at both carotid
bifurcations and ICA siphons. 60% Right ICA origin stenosis.
Moderate stenosis of the Left ICA cavernous segment.

3. Mild irregularity at the left MCA and ACA origins without
significant stenosis. No posterior circulation stenosis.

4. Partially visible mediastinal lymphadenopathy, nonspecific but
perhaps due to HIV related lymphoproliferative disorder in this
setting. Recommend follow-up Chest CT with IV contrast.

5. Aortic Atherosclerosis (4AFH2-FU9.9).

## 2022-07-13 IMAGING — MR MR HEAD WO/W CM
13 of 21 series · 17 of 48 positions shown · IV contrast (gadavist)
Comparison: CT from 07/18/2021.

CLINICAL DATA: Initial evaluation for new onset seizure.

EXAM:
MRI HEAD WITHOUT AND WITH CONTRAST
TECHNIQUE: Multiplanar, multiecho pulse sequences of the brain and surrounding
structures were obtained without and with intravenous contrast.
CONTRAST:  6mL GADAVIST GADOBUTROL 1 MMOL/ML IV SOLN

[Series 3: DWI · axial · 3.0mm · 0.94mm/px · z∈[-164,-4]mm · 2 of 112 slices shown (1 of 2)]
[im 1/112]
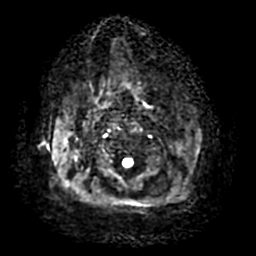
[im 112/112]
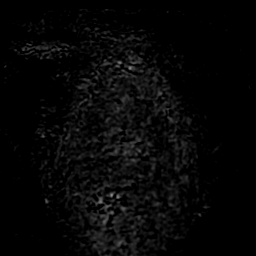

[Series 4: DWI · coronal · 4.0mm · 0.94mm/px · 2 of 81 slices shown (2 of 2)]
[im 1/81]
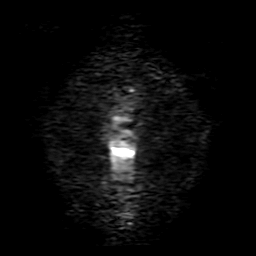
[im 81/81]
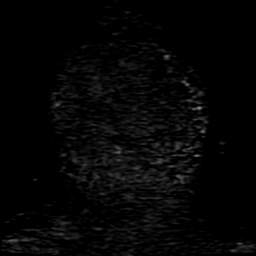

[Series 5: FLAIR · sagittal · 5.0mm · 0.23mm/px · 1 of 26 slices shown (1 of 3)]
[im 1/26]
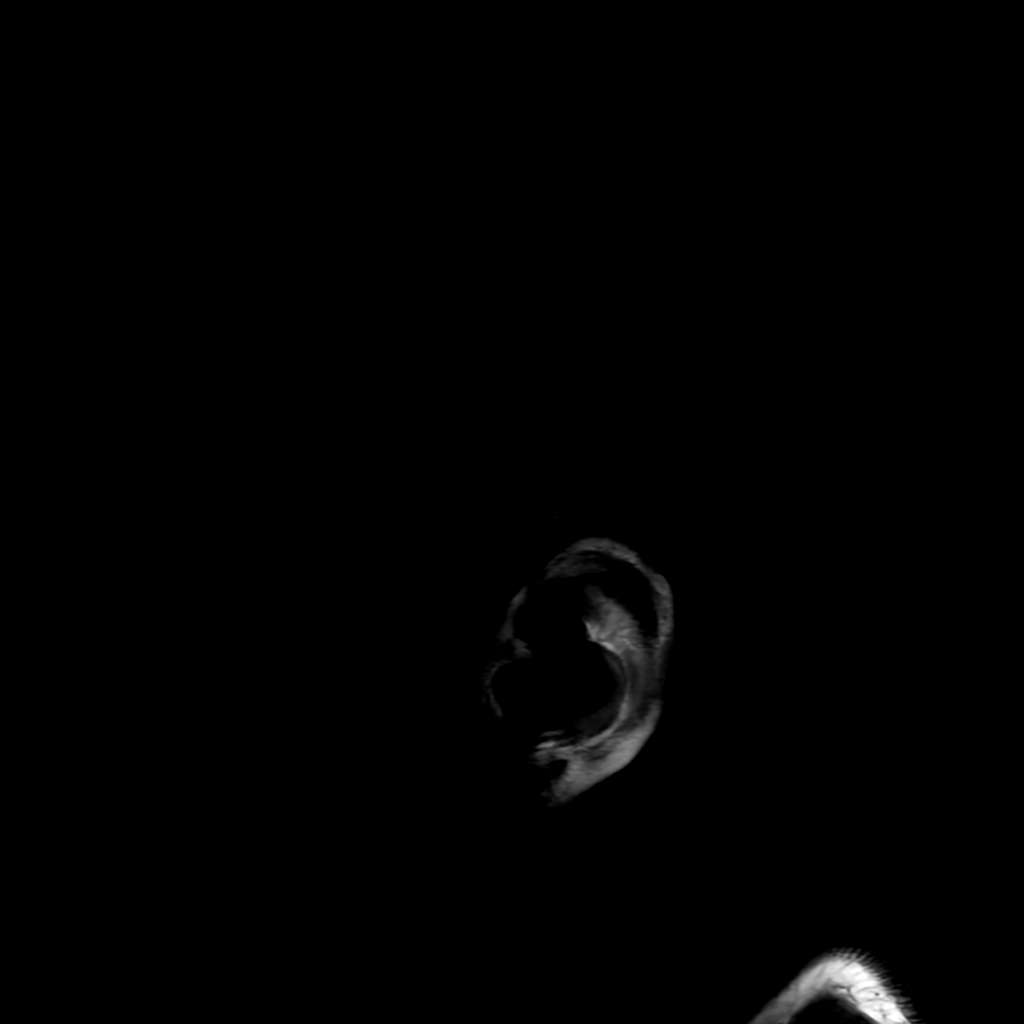

[Series 6: T2 · axial · 5.0mm · 0.23mm/px · 1 of 28 slices shown (1 of 3)]
[im 1/28]
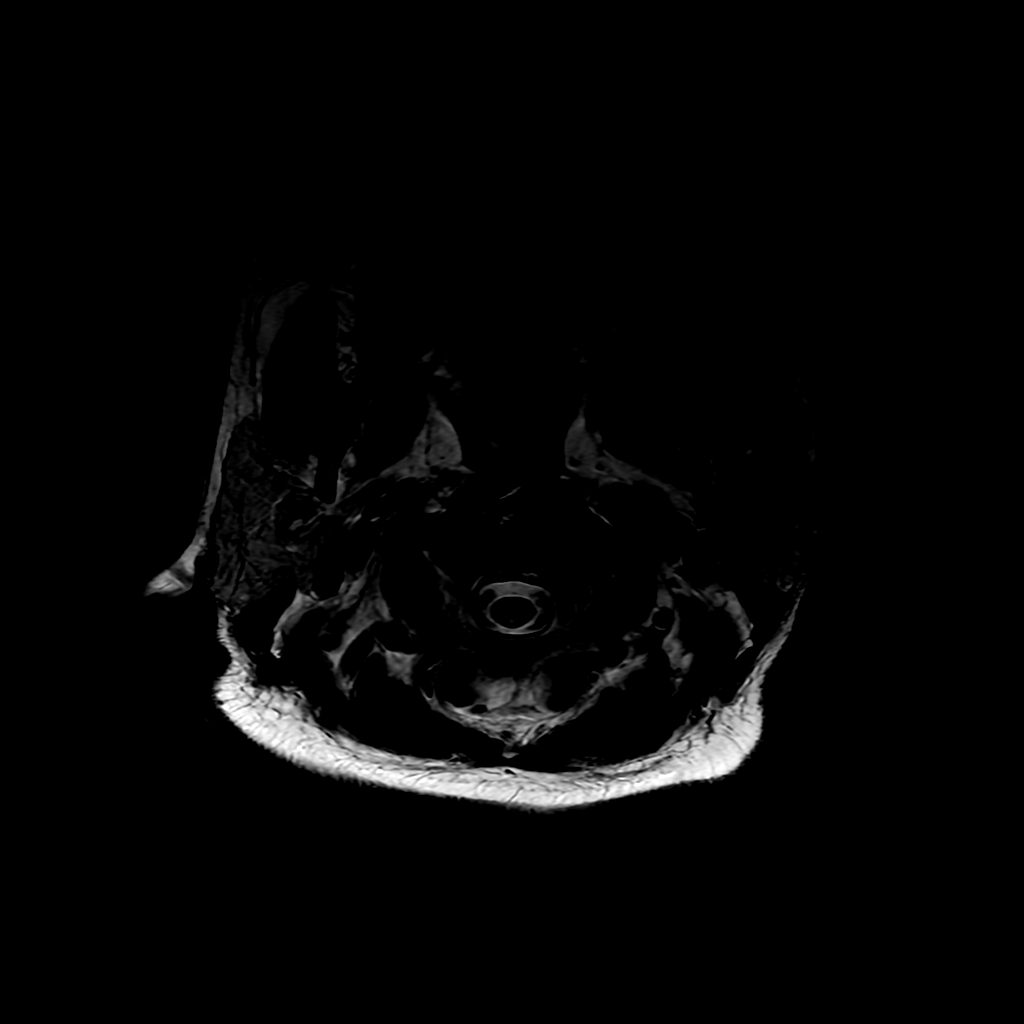

[Series 7: FLAIR · axial · 4.0mm · 0.45mm/px · 1 of 37 slices shown (2 of 3)]
[im 1/37]
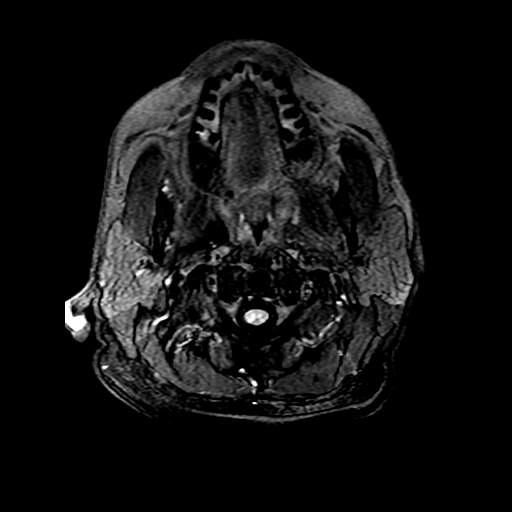

[Series 11: T2 · coronal · 3.0mm · 0.35mm/px · 1 of 45 slices shown (2 of 3)]
[im 1/45]
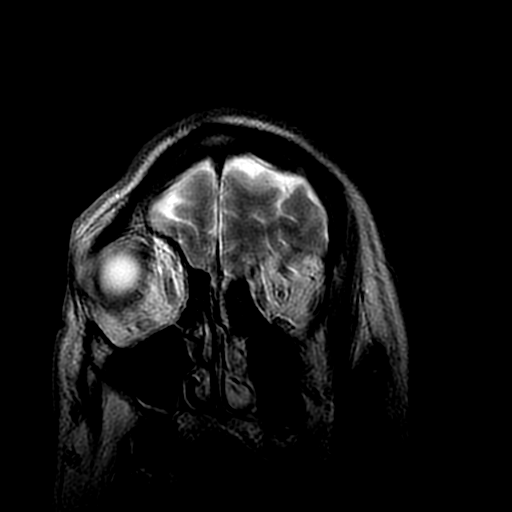

[Series 12: FLAIR · coronal · 3.0mm · 0.35mm/px · 1 of 45 slices shown (3 of 3)]
[im 1/45]
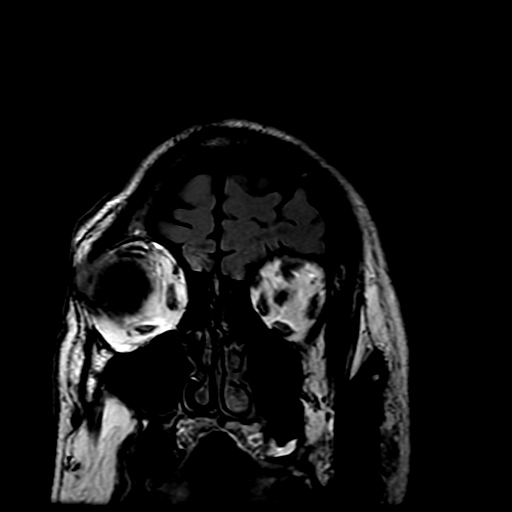

[Series 15: T2 · coronal · 3.0mm · 0.35mm/px · 1 of 45 slices shown (3 of 3)]
[im 1/45]
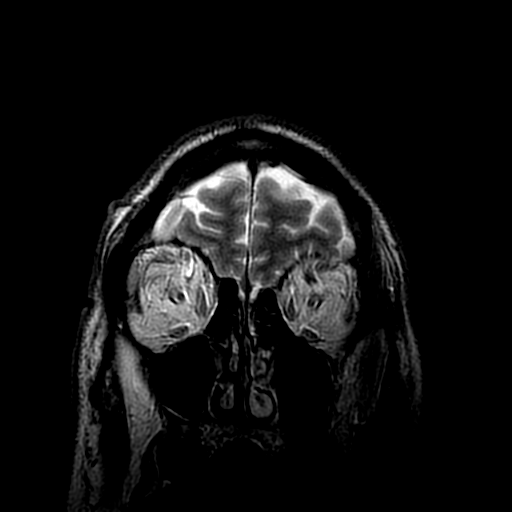

[Series 17: T2 post-contrast · coronal · 5.0mm · 0.23mm/px · 1 of 34 slices shown]
[im 1/34]
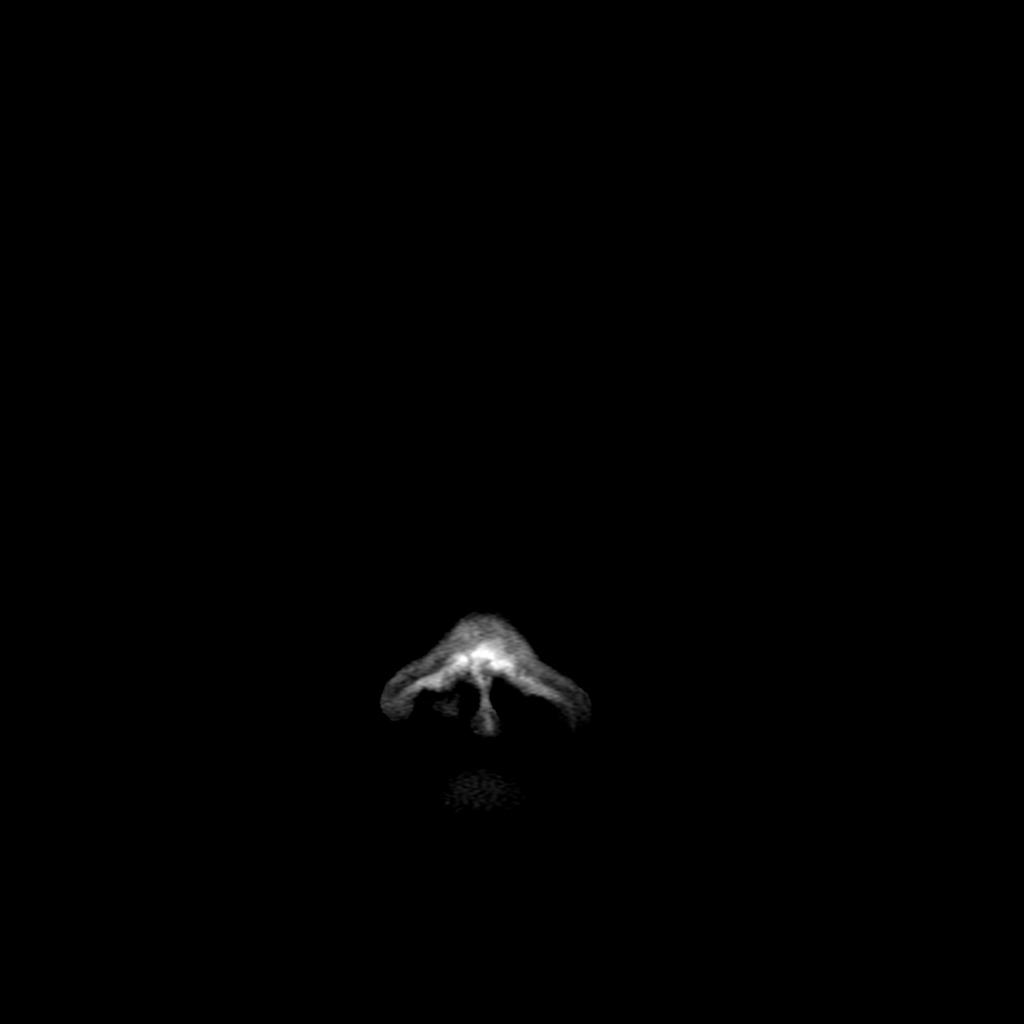

[Series 18: T1 · axial · 3.0mm · 0.94mm/px · z∈[-149,-1]mm · 2 of 52 slices shown (1 of 2)]
[im 1/52]
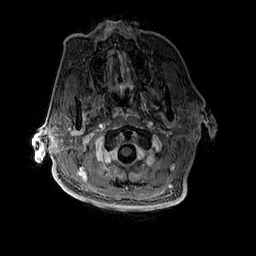
[im 52/52]
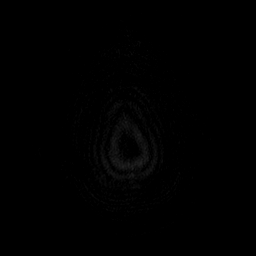

[Series 20: T1 · coronal · 5.0mm · 0.47mm/px · 1 of 33 slices shown (2 of 2)]
[im 1/33]
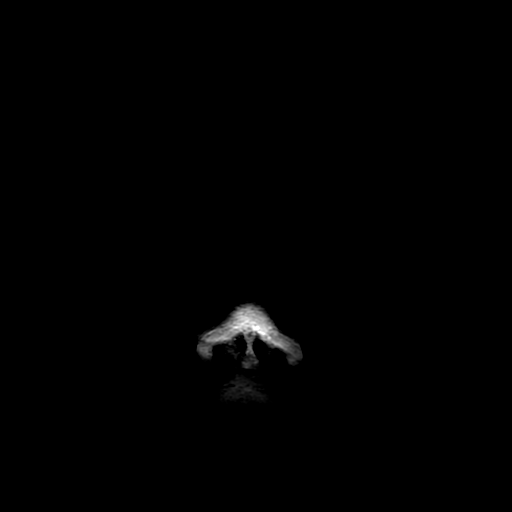

[Series 350: ADC · axial · 3.0mm · 0.94mm/px · z∈[-164,-10]mm · 2 of 52 slices shown (1 of 2)]
[im 1/52]
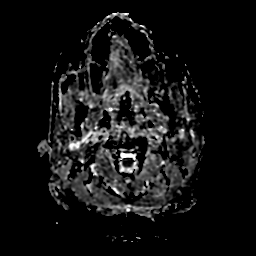
[im 52/52]
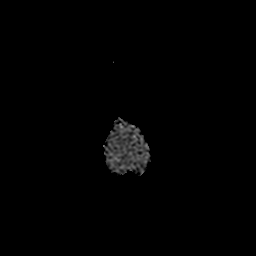

[Series 450: ADC · coronal · 4.0mm · 0.94mm/px · 1 of 38 slices shown (2 of 2)]
[im 1/38]
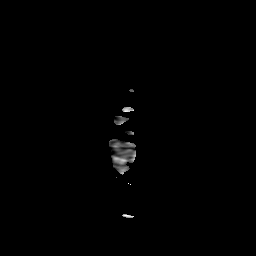

[17 of 48 positions shown; findings below may reference images not displayed]

FINDINGS: Brain:

Cerebral volume within normal limits. Mild chronic microvascular
ischemic disease noted involving the periventricular white matter.

Few small acute ischemic infarcts seen involving the subcortical
right cerebral hemisphere (series 3, images 38, 35, 27). Largest
area of ischemia seen at the right periatrial white matter and
measures up to 7 mm. No associated hemorrhage or mass effect.

Gray-white matter differentiation otherwise maintained. No areas of
chronic cortical infarction or other insult. No visible acute or
chronic intracranial blood products.

No mass lesion, midline shift or mass effect. No hydrocephalus or
extra-axial fluid collection. Pituitary gland suprasellar region
within normal limits. Midline structures intact and normally formed.
No intrinsic temporal lobe abnormality. No abnormal enhancement.

Vascular: Major intracranial vascular flow voids are grossly
maintained at the skull base on this motion degraded exam.

Skull and upper cervical spine: Craniocervical junction within
normal limits. Bone marrow signal intensity normal. No scalp soft
tissue abnormality.

Sinuses/Orbits: Globes and orbital soft tissues within normal
limits. Paranasal sinuses are largely clear. No significant mastoid
effusion.

Other: None.
IMPRESSION: 1. Few scattered subcentimeter acute ischemic infarcts involving the
subcortical right cerebral hemisphere as above. No associated
hemorrhage or mass effect.
2. No other acute intracranial abnormality.
3. Underlying mild chronic microvascular ischemic disease.

## 2022-07-18 ENCOUNTER — Telehealth: Payer: Self-pay

## 2022-07-18 ENCOUNTER — Ambulatory Visit: Payer: BLUE CROSS/BLUE SHIELD | Admitting: Infectious Diseases

## 2022-07-18 NOTE — Telephone Encounter (Signed)
Called patient to reschedule today's missed appointment. No answer and mailbox full.   Sandie Ano, RN

## 2022-07-19 IMAGING — US US RENAL
1 series · 14 of 25 positions shown · non-contrast
Comparison: CT 07/18/2021

CLINICAL DATA: Acute kidney injury

EXAM:
RENAL / URINARY TRACT ULTRASOUND COMPLETE

[Series 1: us renal · 14 of 54 slices shown]
[im 1/54]
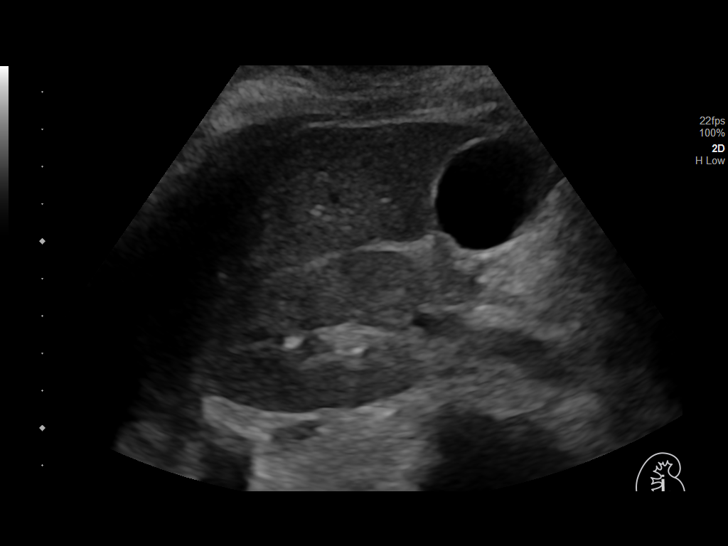
[im 5/54]
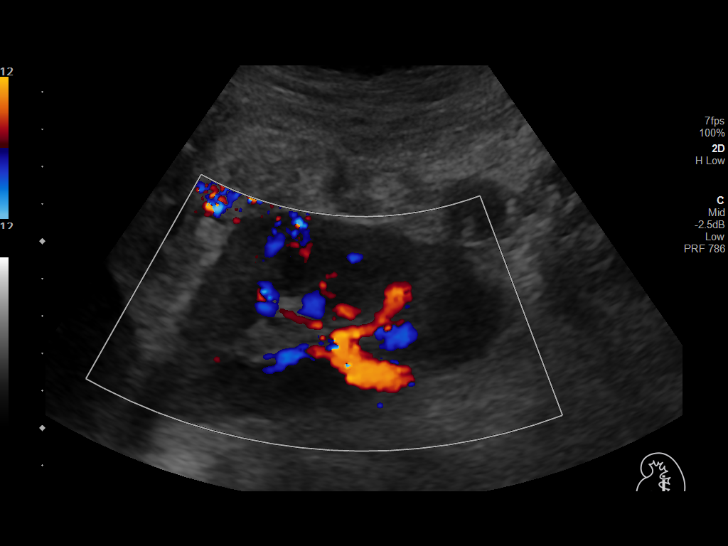
[im 9/54]
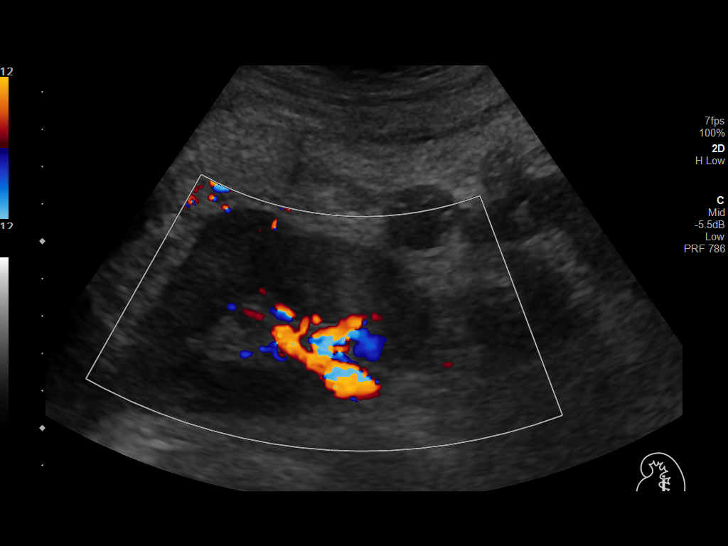
[im 14/54]
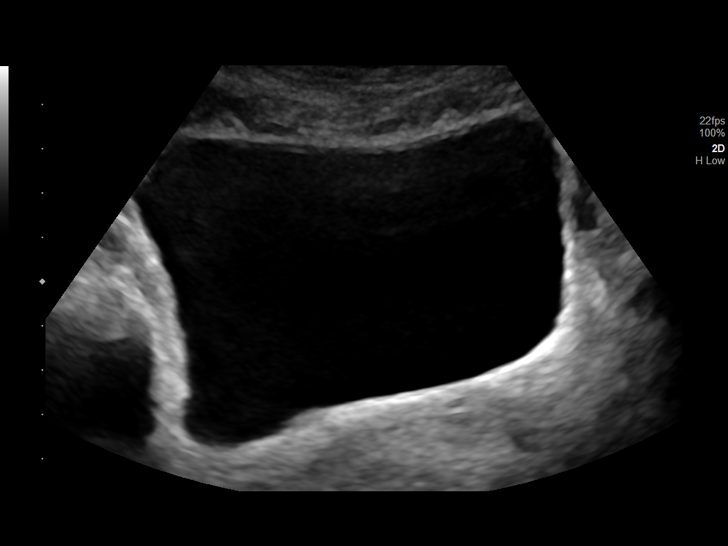
[im 18/54]
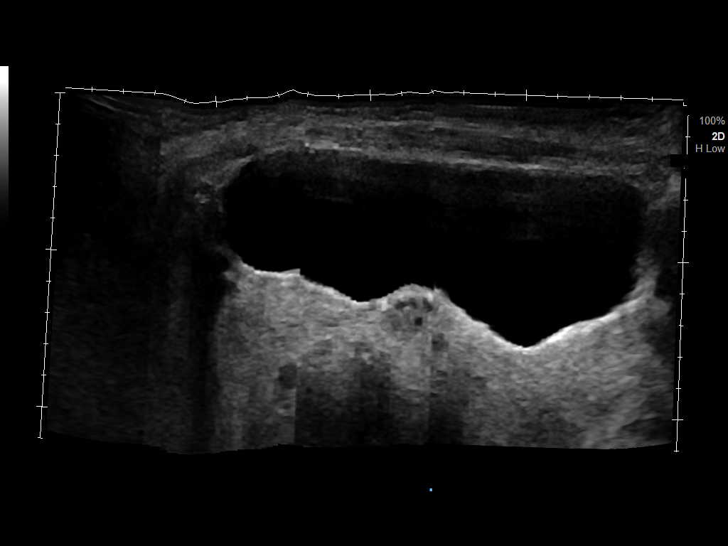
[im 20/54]
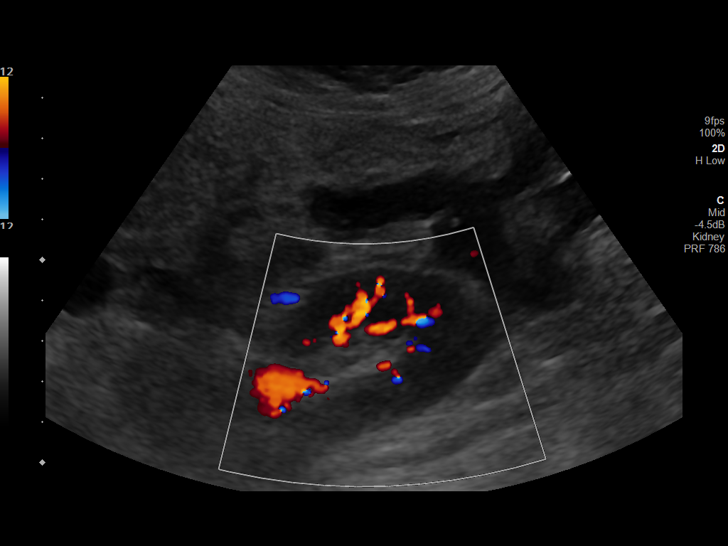
[im 25/54]
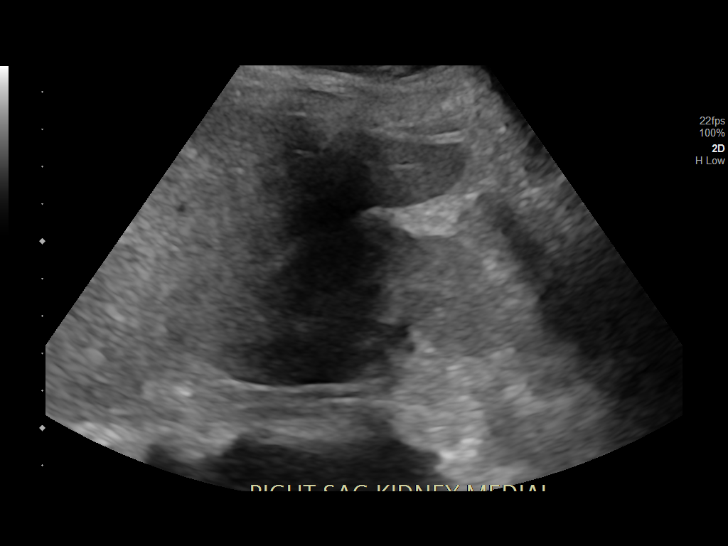
[im 29/54]
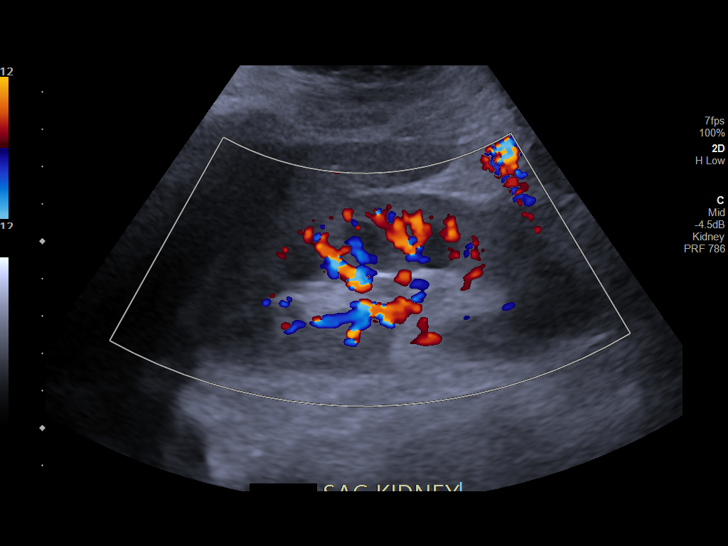
[im 34/54]
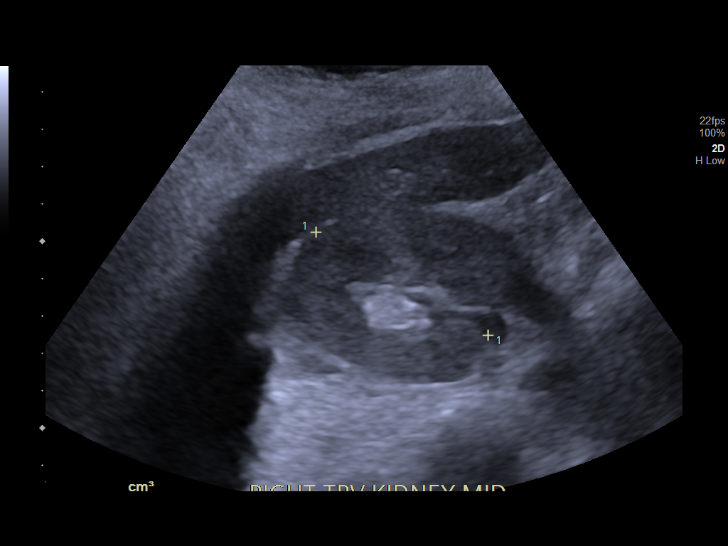
[im 36/54]
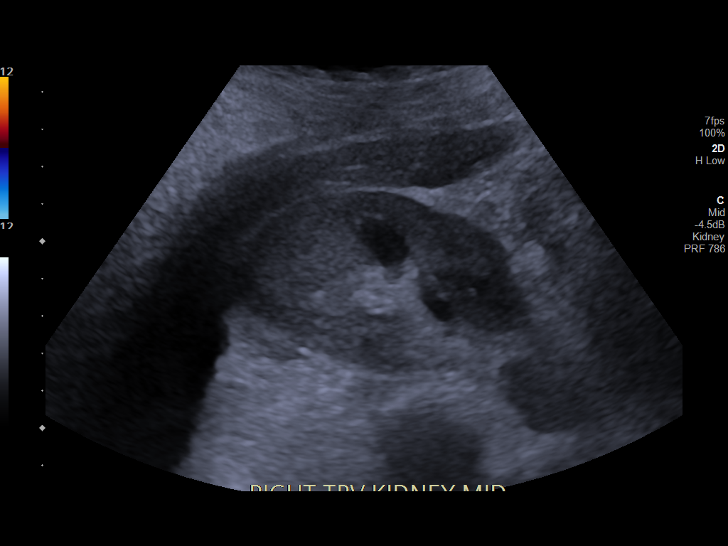
[im 40/54]
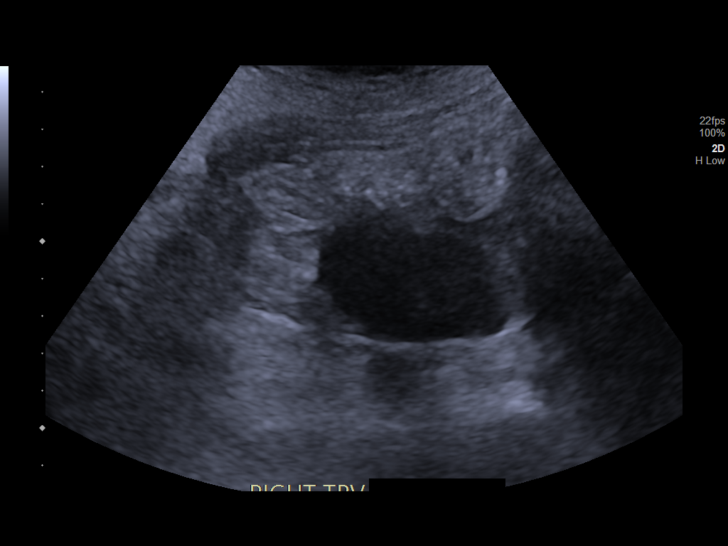
[im 45/54]
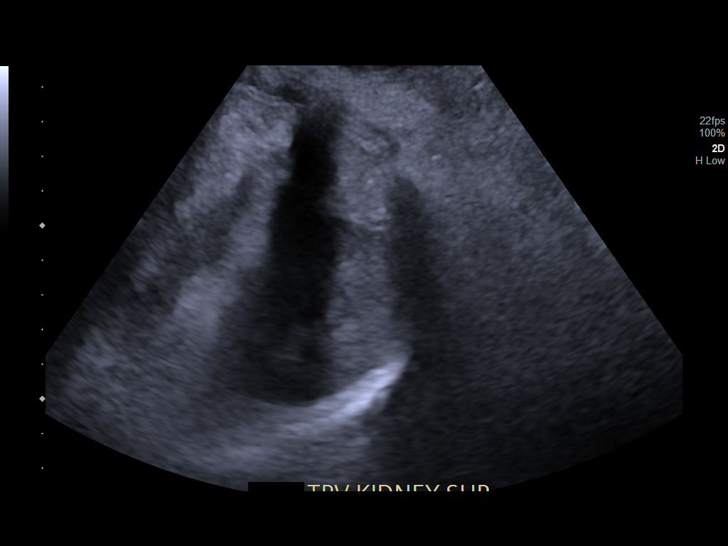
[im 49/54]
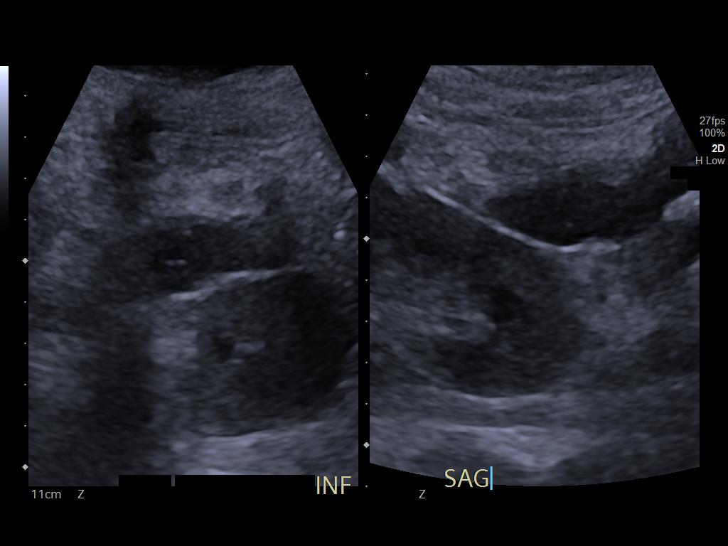
[im 54/54]
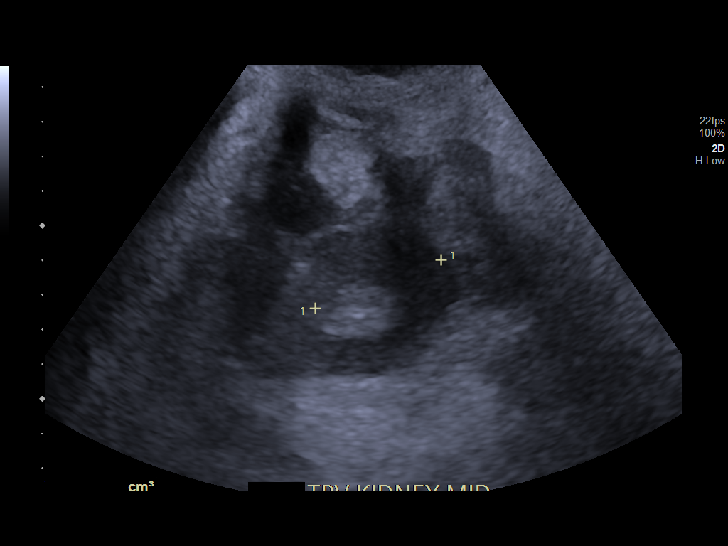

[14 of 25 positions shown; findings below may reference images not displayed]

FINDINGS: Right Kidney:

Renal measurements: 11.1 x 4.4 x 5.4 cm = volume: 138 mL.
Echogenicity within normal limits. No mass, shadowing stone, or
hydronephrosis visualized.

Left Kidney:

Renal measurements: 10.3 x 4.8 x 3.9 cm = volume: 101 mL.
Echogenicity within normal limits. 8 mm lower pole left renal cyst.
No follow-up required. No solid mass, shadowing stone, or
hydronephrosis visualized.

Bladder:

Appears normal for degree of bladder distention.

Other:

None.
IMPRESSION: Negative for obstructive uropathy.

## 2022-07-25 ENCOUNTER — Emergency Department (HOSPITAL_COMMUNITY)
Admission: EM | Admit: 2022-07-25 | Discharge: 2022-07-25 | Disposition: A | Discharge status: Home / Self Care | Payer: BLUE CROSS/BLUE SHIELD | Attending: Emergency Medicine | Admitting: Emergency Medicine

## 2022-07-25 ENCOUNTER — Emergency Department (HOSPITAL_COMMUNITY): Payer: BLUE CROSS/BLUE SHIELD

## 2022-07-25 ENCOUNTER — Other Ambulatory Visit: Payer: Self-pay

## 2022-07-25 ENCOUNTER — Ambulatory Visit: Payer: BLUE CROSS/BLUE SHIELD | Admitting: Infectious Diseases

## 2022-07-25 ENCOUNTER — Encounter (HOSPITAL_COMMUNITY): Payer: BLUE CROSS/BLUE SHIELD | Admitting: Cardiology

## 2022-07-25 ENCOUNTER — Encounter (HOSPITAL_COMMUNITY): Payer: Self-pay | Admitting: Pharmacy Technician

## 2022-07-25 DIAGNOSIS — R1031 Right lower quadrant pain: Secondary | ICD-10-CM | POA: Diagnosis not present

## 2022-07-25 DIAGNOSIS — I509 Heart failure, unspecified: Secondary | ICD-10-CM | POA: Diagnosis not present

## 2022-07-25 DIAGNOSIS — G629 Polyneuropathy, unspecified: Secondary | ICD-10-CM | POA: Insufficient documentation

## 2022-07-25 DIAGNOSIS — R5383 Other fatigue: Secondary | ICD-10-CM

## 2022-07-25 DIAGNOSIS — E119 Type 2 diabetes mellitus without complications: Secondary | ICD-10-CM | POA: Diagnosis not present

## 2022-07-25 DIAGNOSIS — Z21 Asymptomatic human immunodeficiency virus [HIV] infection status: Secondary | ICD-10-CM | POA: Diagnosis not present

## 2022-07-25 DIAGNOSIS — R1032 Left lower quadrant pain: Secondary | ICD-10-CM | POA: Insufficient documentation

## 2022-07-25 DIAGNOSIS — I110 Hypertensive heart disease with heart failure: Secondary | ICD-10-CM | POA: Diagnosis not present

## 2022-07-25 DIAGNOSIS — M79669 Pain in unspecified lower leg: Secondary | ICD-10-CM | POA: Diagnosis present

## 2022-07-25 DIAGNOSIS — Z7902 Long term (current) use of antithrombotics/antiplatelets: Secondary | ICD-10-CM | POA: Insufficient documentation

## 2022-07-25 LAB — URINALYSIS, W/ REFLEX TO CULTURE (INFECTION SUSPECTED)
Bilirubin Urine: NEGATIVE
Glucose, UA: 150 mg/dL — AB
Hgb urine dipstick: NEGATIVE
Ketones, ur: NEGATIVE mg/dL
Nitrite: NEGATIVE
Protein, ur: >=300 mg/dL — AB
Specific Gravity, Urine: 1.02 (ref 1.005–1.030)
pH: 7 (ref 5.0–8.0)

## 2022-07-25 LAB — COMPREHENSIVE METABOLIC PANEL
ALT: 10 U/L (ref 0–44)
AST: 11 U/L — ABNORMAL LOW (ref 15–41)
Albumin: 2.3 g/dL — ABNORMAL LOW (ref 3.5–5.0)
Alkaline Phosphatase: 86 U/L (ref 38–126)
Anion gap: 13 (ref 5–15)
BUN: 29 mg/dL — ABNORMAL HIGH (ref 6–20)
CO2: 17 mmol/L — ABNORMAL LOW (ref 22–32)
Calcium: 8.5 mg/dL — ABNORMAL LOW (ref 8.9–10.3)
Chloride: 103 mmol/L (ref 98–111)
Creatinine, Ser: 1.93 mg/dL — ABNORMAL HIGH (ref 0.44–1.00)
GFR, Estimated: 30 mL/min — ABNORMAL LOW (ref >60)
Glucose, Bld: 231 mg/dL — ABNORMAL HIGH (ref 70–99)
Potassium: 3.7 mmol/L (ref 3.5–5.1)
Sodium: 133 mmol/L — ABNORMAL LOW (ref 135–145)
Total Bilirubin: 0.3 mg/dL (ref 0.3–1.2)
Total Protein: 6.7 g/dL (ref 6.5–8.1)

## 2022-07-25 LAB — TROPONIN I (HIGH SENSITIVITY): Troponin I (High Sensitivity): 17 ng/L (ref <18)

## 2022-07-25 LAB — LACTIC ACID, PLASMA: Lactic Acid, Venous: 1.3 mmol/L (ref 0.5–1.9)

## 2022-07-25 LAB — CBC
HCT: 30.4 % — ABNORMAL LOW (ref 36.0–46.0)
Hemoglobin: 9.6 g/dL — ABNORMAL LOW (ref 12.0–15.0)
MCH: 21.7 pg — ABNORMAL LOW (ref 26.0–34.0)
MCHC: 31.6 g/dL (ref 30.0–36.0)
MCV: 68.6 fL — ABNORMAL LOW (ref 80.0–100.0)
Platelets: 393 10*3/uL (ref 150–400)
RBC: 4.43 MIL/uL (ref 3.87–5.11)
RDW: 14.6 % (ref 11.5–15.5)
WBC: 17.4 10*3/uL — ABNORMAL HIGH (ref 4.0–10.5)
nRBC: 0 % (ref 0.0–0.2)

## 2022-07-25 LAB — PROTIME-INR
INR: 1.2 (ref 0.8–1.2)
Prothrombin Time: 15.7 seconds — ABNORMAL HIGH (ref 11.4–15.2)

## 2022-07-25 LAB — LIPASE, BLOOD: Lipase: 26 U/L (ref 11–51)

## 2022-07-25 MED ORDER — ACETAMINOPHEN 325 MG PO TABS
650.0000 mg | ORAL_TABLET | Freq: Once | ORAL | Status: AC | PRN
  Administered 2022-07-25: 650 mg via ORAL

## 2022-07-25 MED ORDER — NITROFURANTOIN MONOHYD MACRO 100 MG PO CAPS: 100.0000 mg | ORAL_CAPSULE | Freq: Two times a day (BID) | ORAL | 0 refills | Status: DC

## 2022-07-25 MED ORDER — LACTATED RINGERS IV BOLUS
1000.0000 mL | Freq: Once | INTRAVENOUS | Status: AC
  Administered 2022-07-25: 1000 mL via INTRAVENOUS

## 2022-07-25 NOTE — ED Notes (Signed)
Acuity changed due to pt Vitals.

## 2022-07-25 NOTE — ED Provider Triage Note (Signed)
Emergency Medicine Provider Triage Evaluation Note  Ashley Vaughn , a 57 y.o. female  was evaluated in triage.  Pt complains of BL foot pain from neuropathy. States her daughter called 911 today and she is going to miss her 1pm apt. Hx CHF, has not taken any meds today bc she came to the ER and didn't take them, has intermittent CP since March, unchanged today.   Review of Systems  Positive:  Negative:   Physical Exam  BP 93/67 (BP Location: Left Arm)   Pulse (!) 115   Temp 99.2 F (37.3 C) (Oral)   Resp 20   SpO2 99%  Gen:   Awake, no distress   Resp:  Normal effort  MSK:   Moves extremities without difficulty  Other:  DP pulses present, no lower ext edema.  Medical Decision Making  Medically screening exam initiated at 12:43 PM.  Appropriate orders placed.  Ashley Vaughn was informed that the remainder of the evaluation will be completed by another provider, this initial triage assessment does not replace that evaluation, and the importance of remaining in the ED until their evaluation is complete.     Jeannie Fend, PA-C 07/25/22 1243

## 2022-07-25 NOTE — ED Notes (Signed)
Vitals re checked

## 2022-07-25 NOTE — ED Notes (Signed)
Pt discharged to lobby, NAD noted at time of discharge

## 2022-07-25 NOTE — ED Triage Notes (Signed)
Pt bib ems from home with epigastric pain and chest tightness. Pt with recent abdominal surgery. No emesis. Pt with hx chf, has not had medications today. VSS with ems.

## 2022-07-25 NOTE — ED Provider Notes (Signed)
Patient signed out to me by previous provider. Please refer to their note for full HPI.  Briefly this is a 57 year old female who presented with generalized complaint of not feeling well, going on chronically but slightly worse.  Also possible intermittent fevers for the past couple days.  We are pending metabolic workup and urinalysis.  Blood work has resulted without any acute abnormalities.  Urinalysis is dark with many bacteria and white blood cells.  Will send for culture but will start on oral antibiotics as well.  Patient agrees with this plan.  Offers no new concerns or complaints.  Vitals are stable and she otherwise has no further complaints for me.  Patient at this time appears safe and stable for discharge and close outpatient follow up. Discharge plan and strict return to ED precautions discussed, patient verbalizes understanding and agreement.   Rozelle [name], DO 07/25/22 2109

## 2022-07-25 NOTE — ED Provider Notes (Signed)
[place] EMERGENCY DEPARTMENT AT [facility] Provider Note   CSN: [id number] Arrival date & time: 07/25/22  1238     History  Hypertension Diabetes II Heart failure reduced ejection fraction Human immunodeficiency virus infection Major depressive disorder Cerebrovascular accident Mild cognitive impairment   Chief Complaint  Patient presents with   Abdominal Pain    Ashley Vaughn is a 57 y.o. female with a past medical history of hypertension, diabetes II, HFrEF, HIV, MDD, CVA, and mild cognitive impairment who was delivered by EMS for multiple chronic complaints including lower extremity pain.  Patient was recently evicted from her daughter's house and has been living with her ex-husband for the past week. States that she feels safe at home, but has limited personal space and lacks reliable access to both food and water. Her only acute physical complaint is lower extremity pain secondary to neuropathy that intermittently affects her ability to ambulate. Consequently, she was unable to use public transportation today for her cardiology appointment. Explains that her daughter was unwilling to help with transportation and instead called an ambulance. Further reports a headache that has since resolved with acetaminophen. Denies any other acute symptoms, and generally feels well aside from emotional stress associated with current living situation and relationship with daughter.     Home Medications Prior to Admission medications   Medication Sig Start Date End Date Taking? Authorizing Provider  Accu-Chek Softclix Lancets lancets Check blood sugar 3 times per day 06/12/22   Marrianne Mood, MD  bictegravir-emtricitabine-tenofovir AF (BIKTARVY) 50-200-25 MG TABS tablet Take 1 tablet by mouth daily. 12/21/21   Blanchard Kelch, NP  Blood Glucose Monitoring Suppl (ACCU-CHEK GUIDE) w/Device KIT To monitor blood sugar, use in the morning before first meal of the day. 06/12/22    Marrianne Mood, MD  carvedilol (COREG) 3.125 MG tablet Take 1 tablet (3.125 mg total) by mouth 2 (two) times daily with a meal. 06/12/22   Marrianne Mood, MD  clopidogrel (PLAVIX) 75 MG tablet Take 1 tablet (75 mg total) by mouth daily. 09/27/21   Reymundo Poll, MD  DULoxetine HCl 40 MG CPEP Take 1 capsule (40 mg total) by mouth daily with breakfast. 06/12/22   Marrianne Mood, MD  Empagliflozin-metFORMIN HCl (SYNJARDY) 12.5-500 MG TABS Take 1 tablet by mouth 2 (two) times daily. 06/12/22   Marrianne Mood, MD  glucose blood (ACCU-CHEK GUIDE) test strip Use as instructed 06/12/22   Marrianne Mood, MD  rosuvastatin (CRESTOR) 20 MG tablet Take 1 tablet (20 mg total) by mouth daily. 11/01/21   Sabharwal, Aditya, DO  sacubitril-valsartan (ENTRESTO) 24-26 MG Take 1 tablet by mouth 2 (two) times daily. 11/20/21   Sabharwal, Aditya, DO  Semaglutide,0.25 or 0.5MG /DOS, 2 MG/3ML SOPN Inject 0.25 mg into the skin once a week. 06/12/22   Marrianne Mood, MD      Allergies    Metformin and related and Fluorescein    Review of Systems   Review of Systems  Gastrointestinal:  Positive for abdominal pain.    Physical Exam Updated Vital Signs BP 110/71   Pulse 99   Temp (!) 100.8 F (38.2 C)   Resp 15   SpO2 100%  Physical Exam  Awake and alert, fully oriented, cooperative, not in acute distress  Heart normal rate and rhythm, normal S1 and S2, no murmurs Capillary refill ~2 seconds, slightly decreased skin turgor, peripheral pulses intact Abdomen soft and non-distended, mild tenderness to palpation over lateral RLQ and LLQ Well-healed incisional scar located on RLQ,  no surrounding erythema or discharge   ED Results / Procedures / Treatments   Labs (all labs ordered are listed, but only abnormal results are displayed) Labs Reviewed  PROTIME-INR - Abnormal; Notable for the following components:      Result Value   Prothrombin Time 15.7 (*)    All other components within normal  limits  CULTURE, BLOOD (ROUTINE X 2)  CULTURE, BLOOD (ROUTINE X 2)  LACTIC ACID, PLASMA  LIPASE, BLOOD  COMPREHENSIVE METABOLIC PANEL  CBC  LACTIC ACID, PLASMA  URINALYSIS, W/ REFLEX TO CULTURE (INFECTION SUSPECTED)  TROPONIN I (HIGH SENSITIVITY)  TROPONIN I (HIGH SENSITIVITY)    EKG EKG Interpretation  Date/Time:  Thursday July 25 2022 12:49:26 EDT Ventricular Rate:  116 PR Interval:  114 QRS Duration: 68 QT Interval:  312 QTC Calculation: 433 R Axis:   42 Text Interpretation: Sinus tachycardia Cannot rule out Anterior infarct , age undetermined Abnormal ECG No significant change since last tracing Confirmed by Melene Plan [phone]) on 07/25/2022 1:40:17 PM  Radiology DG Chest 2 View  Result Date: 07/25/2022 CLINICAL DATA:  chest tightness EXAM: CHEST - 2 VIEW COMPARISON:  CXR 07/18/21 FINDINGS: The heart size and mediastinal contours are within normal limits. Both lungs are clear. The visualized skeletal structures are unremarkable. IMPRESSION: No active pulmonary disease. Electronically Signed   By: Lorenza Cambridge M.D.   On: 07/25/2022 14:24    Procedures Procedures   None   Medications Ordered in ED Medications  acetaminophen (TYLENOL) tablet 650 mg (650 mg Oral Given 07/25/22 1256)  lactated ringers bolus 1,000 mL (1,000 mLs Intravenous New Bag/Given 07/25/22 1502)    ED Course/ Medical Decision Making/ A&P   {   Click here for ABCD2, HEART and other calculators                         Medical Decision Making Amount and/or Complexity of Data Reviewed Labs: ordered. Radiology: ordered.  Risk OTC drugs.   Patient was delivered by EMS after her daughter called for transportation assistance to a cardiology appointment. She describes a very tenuous relationship with her daughter, significant emotional distress over the past week, and limited access to food-water at home. Reports worsening leg pain from peripheral neuropathy and persistence of longstanding abdominal  discomfort. Denies any acute changes in her multiple medical conditions and symptoms. Vitals upon arrival notable for mild hyperthermia, hypotension, and tachycardia. Exam largely unremarkable, but demonstrated evidence of hypovolemia. Chest radiograph negative for active pathology. Presentation consistent with dehydration secondary to poor oral intake. One liter of intravenous fluids has been administered. Low concern for an active infectious process including sepsis. Comprehensive diagnostic workup is currently pending. Case signed out to oncoming ED provider, Dr Coralee Pesa.   Final Clinical Impression(s) / ED Diagnoses Final diagnoses:  None    Rx / DC Orders ED Discharge Orders     None         Crissie Sickles, MD 07/25/22 1540    Melene Plan, DO 07/25/22 1542

## 2022-07-25 NOTE — Discharge Instructions (Signed)
You have been seen and discharged from the emergency department.  Your workup showed mild dehydration and what looks like a possible UTI.  Take antibiotic as directed.  A urine culture has been sent for further evaluation.  Follow-up with your primary provider for further evaluation and further care. Take home medications as prescribed. If you have any worsening symptoms or further concerns for your health please return to an emergency department for further evaluation.

## 2022-07-25 NOTE — ED Notes (Signed)
Needs EKG reviewed and vitals recheck before destination determined

## 2022-07-26 LAB — BLOOD CULTURE ID PANEL (REFLEXED) - BCID2

## 2022-07-26 LAB — URINE CULTURE

## 2022-07-26 LAB — CULTURE, BLOOD (ROUTINE X 2)

## 2022-07-26 NOTE — ED Notes (Signed)
No answer on the pts phone answering machine answered and asked to return here at [facility] or [place] due to pos  blood culture    ed phone number left

## 2022-07-26 NOTE — ED Notes (Signed)
The lab called with a positive blood culture gram pos cocci in chains   reported to dr Renaye Rakers edp  he wants her called back and telll her to come here for [place] ed

## 2022-07-27 LAB — CULTURE, BLOOD (ROUTINE X 2)

## 2022-07-28 ENCOUNTER — Telehealth (HOSPITAL_BASED_OUTPATIENT_CLINIC_OR_DEPARTMENT_OTHER): Payer: Self-pay | Admitting: Emergency Medicine

## 2022-07-28 ENCOUNTER — Telehealth (HOSPITAL_BASED_OUTPATIENT_CLINIC_OR_DEPARTMENT_OTHER): Payer: Self-pay | Admitting: *Deleted

## 2022-07-28 NOTE — Telephone Encounter (Addendum)
Post ED Visit - Positive Culture Follow-up: Unsuccessful Patient Follow-up  Culture assessed and recommendations reviewed by:  []  Enzo Bi, Pharm.D. []  Celedonio Miyamoto, Pharm.D., BCPS AQ-ID []  Garvin Fila, Pharm.D., BCPS []  Georgina Pillion, Pharm.D., BCPS []  [place], [address].D., BCPS, AAHIVP []  Estella Husk, Pharm.D., BCPS, AAHIVP [x]  Estill Batten, PharmD []  Pollyann Samples, PharmD, BCPS  Positive Blood culture  []  Patient discharged without antimicrobial prescription and treatment is now indicated []  Organism is resistant to prescribed ED discharge antimicrobial [x]  Patient with positive blood cultures   Unable to contact patient by phone, left message. Unable to send a letter due to no address on file.  Ashley Vaughn 07/28/2022, 9:50 PM

## 2022-07-28 NOTE — Progress Notes (Signed)
ED Antimicrobial Stewardship Positive Culture Follow Up   Ashley Vaughn is an 57 y.o. female who presented to [facility] on 07/25/2022 with a chief complaint of  Chief Complaint  Patient presents with   Abdominal Pain    Recent Results (from the past 720 hour(s))  Culture, blood (Routine x 2)     Status: None (Preliminary result)   Collection Time: 07/25/22  2:25 PM   Specimen: BLOOD  Result Value Ref Range Status   Specimen Description BLOOD SITE NOT SPECIFIED  Final   Special Requests   Final    BOTTLES DRAWN AEROBIC AND ANAEROBIC Blood Culture results may not be optimal due to an inadequate volume of blood received in culture bottles   Culture   Final    NO GROWTH 3 DAYS Performed at [facility], [address] [place], [place] [address]    Report Status PENDING  Incomplete  Culture, blood (Routine x 2)     Status: Abnormal   Collection Time: 07/25/22  2:30 PM   Specimen: BLOOD  Result Value Ref Range Status   Specimen Description BLOOD SITE NOT SPECIFIED  Final   Special Requests   Final    BOTTLES DRAWN AEROBIC AND ANAEROBIC Blood Culture results may not be optimal due to an inadequate volume of blood received in culture bottles   Culture  Setup Time   Final    GRAM POSITIVE COCCI IN CLUSTERS ANAEROBIC BOTTLE ONLY CRITICAL RESULT CALLED TO, READ BACK BY AND VERIFIED WITH: RN C. CHRISCO [phone] @1622  FH    Culture (A)  Final    STAPHYLOCOCCUS EPIDERMIDIS THE SIGNIFICANCE OF ISOLATING THIS ORGANISM FROM A SINGLE SET OF BLOOD CULTURES WHEN MULTIPLE SETS ARE DRAWN IS UNCERTAIN. PLEASE NOTIFY THE MICROBIOLOGY DEPARTMENT WITHIN ONE WEEK IF SPECIATION AND SENSITIVITIES ARE REQUIRED. Performed at [facility], [address], [place], [place] [address]    Report Status 07/27/2022 FINAL  Final  Blood Culture ID Panel (Reflexed)     Status: Abnormal   Collection Time: 07/25/22  2:30 PM  Result Value Ref Range Status   Enterococcus faecalis NOT DETECTED NOT DETECTED  Final   Enterococcus Faecium NOT DETECTED NOT DETECTED Final   Listeria monocytogenes NOT DETECTED NOT DETECTED Final   Staphylococcus species DETECTED (A) NOT DETECTED Final    Comment: CRITICAL RESULT CALLED TO, READ BACK BY AND VERIFIED WITH: RN C. CHRISCO [phone] @1622  FH    Staphylococcus aureus (BCID) NOT DETECTED NOT DETECTED Final   Staphylococcus epidermidis DETECTED (A) NOT DETECTED Final    Comment: CRITICAL RESULT CALLED TO, READ BACK BY AND VERIFIED WITH: RN C. CHRISCO [phone] @1622  FH    Staphylococcus lugdunensis NOT DETECTED NOT DETECTED Final   Streptococcus species NOT DETECTED NOT DETECTED Final   Streptococcus agalactiae NOT DETECTED NOT DETECTED Final   Streptococcus pneumoniae NOT DETECTED NOT DETECTED Final   Streptococcus pyogenes NOT DETECTED NOT DETECTED Final   A.calcoaceticus-baumannii NOT DETECTED NOT DETECTED Final   Bacteroides fragilis NOT DETECTED NOT DETECTED Final   Enterobacterales NOT DETECTED NOT DETECTED Final   Enterobacter cloacae complex NOT DETECTED NOT DETECTED Final   Escherichia coli NOT DETECTED NOT DETECTED Final   Klebsiella aerogenes NOT DETECTED NOT DETECTED Final   Klebsiella oxytoca NOT DETECTED NOT DETECTED Final   Klebsiella pneumoniae NOT DETECTED NOT DETECTED Final   Proteus species NOT DETECTED NOT DETECTED Final   Salmonella species NOT DETECTED NOT DETECTED Final   Serratia marcescens NOT DETECTED  NOT DETECTED Final   Haemophilus influenzae NOT DETECTED NOT DETECTED Final   Neisseria meningitidis NOT DETECTED NOT DETECTED Final   Pseudomonas aeruginosa NOT DETECTED NOT DETECTED Final   Stenotrophomonas maltophilia NOT DETECTED NOT DETECTED Final   Candida albicans NOT DETECTED NOT DETECTED Final   Candida auris NOT DETECTED NOT DETECTED Final   Candida glabrata NOT DETECTED NOT DETECTED Final   Candida krusei NOT DETECTED NOT DETECTED Final   Candida parapsilosis NOT DETECTED NOT DETECTED Final   Candida tropicalis NOT  DETECTED NOT DETECTED Final   Cryptococcus neoformans/gattii NOT DETECTED NOT DETECTED Final   Methicillin resistance mecA/C NOT DETECTED NOT DETECTED Final    Comment: Performed at [facility], [address], [place], [place] [address]  Urine Culture     Status: Abnormal   Collection Time: 07/25/22  5:45 PM   Specimen: Urine, Random  Result Value Ref Range Status   Specimen Description URINE, RANDOM  Final   Special Requests   Final    NONE Reflexed from [id number] Performed at [facility], [address] [place], [place] [address]    Culture MULTIPLE SPECIES PRESENT, SUGGEST RECOLLECTION (A)  Final   Report Status 07/26/2022 FINAL  Final    [x]  Treated with Macrobid, organism resistant to prescribed antimicrobial  1/2 BCX with staph epi. [facility] has been called and advised to return to ED. Wll have patient placement call again to try and reach patient and advise to come back to be evaluated.   ED Provider: Lurena Nida, PA-C    Estill Batten, PharmD, BCCCP  07/28/2022, 11:54 AM Clinical Pharmacist Monday - Friday phone -  [phone] Saturday - Sunday phone - [phone]

## 2022-07-29 ENCOUNTER — Emergency Department (HOSPITAL_COMMUNITY): Payer: BLUE CROSS/BLUE SHIELD

## 2022-07-29 ENCOUNTER — Emergency Department (HOSPITAL_COMMUNITY)
Admission: EM | Admit: 2022-07-29 | Discharge: 2022-07-30 | Disposition: A | Discharge status: Other Acute Inpatient Hospital | Payer: BLUE CROSS/BLUE SHIELD | Attending: Emergency Medicine | Admitting: Emergency Medicine

## 2022-07-29 ENCOUNTER — Other Ambulatory Visit: Payer: Self-pay

## 2022-07-29 DIAGNOSIS — N183 Chronic kidney disease, stage 3 unspecified: Secondary | ICD-10-CM | POA: Insufficient documentation

## 2022-07-29 DIAGNOSIS — E1122 Type 2 diabetes mellitus with diabetic chronic kidney disease: Secondary | ICD-10-CM | POA: Diagnosis not present

## 2022-07-29 DIAGNOSIS — Z7984 Long term (current) use of oral hypoglycemic drugs: Secondary | ICD-10-CM | POA: Insufficient documentation

## 2022-07-29 DIAGNOSIS — D72829 Elevated white blood cell count, unspecified: Secondary | ICD-10-CM | POA: Insufficient documentation

## 2022-07-29 DIAGNOSIS — L02211 Cutaneous abscess of abdominal wall: Secondary | ICD-10-CM

## 2022-07-29 DIAGNOSIS — I509 Heart failure, unspecified: Secondary | ICD-10-CM | POA: Insufficient documentation

## 2022-07-29 DIAGNOSIS — Z8673 Personal history of transient ischemic attack (TIA), and cerebral infarction without residual deficits: Secondary | ICD-10-CM | POA: Diagnosis not present

## 2022-07-29 DIAGNOSIS — R1031 Right lower quadrant pain: Secondary | ICD-10-CM | POA: Diagnosis present

## 2022-07-29 DIAGNOSIS — Z21 Asymptomatic human immunodeficiency virus [HIV] infection status: Secondary | ICD-10-CM | POA: Insufficient documentation

## 2022-07-29 LAB — CBC WITH DIFFERENTIAL/PLATELET
Abs Immature Granulocytes: 0.1 10*3/uL — ABNORMAL HIGH (ref 0.00–0.07)
Basophils Absolute: 0.1 10*3/uL (ref 0.0–0.1)
Basophils Relative: 0 %
Eosinophils Absolute: 0.1 10*3/uL (ref 0.0–0.5)
Eosinophils Relative: 0 %
HCT: 35.4 % — ABNORMAL LOW (ref 36.0–46.0)
Hemoglobin: 10.7 g/dL — ABNORMAL LOW (ref 12.0–15.0)
Immature Granulocytes: 1 %
Lymphocytes Relative: 22 %
Lymphs Abs: 3.3 10*3/uL (ref 0.7–4.0)
MCH: 20.5 pg — ABNORMAL LOW (ref 26.0–34.0)
MCHC: 30.2 g/dL (ref 30.0–36.0)
MCV: 67.8 fL — ABNORMAL LOW (ref 80.0–100.0)
Monocytes Absolute: 1.4 10*3/uL — ABNORMAL HIGH (ref 0.1–1.0)
Monocytes Relative: 9 %
Neutro Abs: 10 10*3/uL — ABNORMAL HIGH (ref 1.7–7.7)
Neutrophils Relative %: 68 %
Platelets: 509 10*3/uL — ABNORMAL HIGH (ref 150–400)
RBC: 5.22 MIL/uL — ABNORMAL HIGH (ref 3.87–5.11)
RDW: 14.6 % (ref 11.5–15.5)
WBC: 14.8 10*3/uL — ABNORMAL HIGH (ref 4.0–10.5)
nRBC: 0 % (ref 0.0–0.2)

## 2022-07-29 LAB — LACTIC ACID, PLASMA: Lactic Acid, Venous: 1.5 mmol/L (ref 0.5–1.9)

## 2022-07-29 LAB — COMPREHENSIVE METABOLIC PANEL
ALT: 25 U/L (ref 0–44)
AST: 25 U/L (ref 15–41)
Albumin: 2.2 g/dL — ABNORMAL LOW (ref 3.5–5.0)
Alkaline Phosphatase: 113 U/L (ref 38–126)
Anion gap: 19 — ABNORMAL HIGH (ref 5–15)
BUN: 24 mg/dL — ABNORMAL HIGH (ref 6–20)
CO2: 19 mmol/L — ABNORMAL LOW (ref 22–32)
Calcium: 9 mg/dL (ref 8.9–10.3)
Chloride: 99 mmol/L (ref 98–111)
Creatinine, Ser: 1.27 mg/dL — ABNORMAL HIGH (ref 0.44–1.00)
GFR, Estimated: 50 mL/min — ABNORMAL LOW (ref >60)
Glucose, Bld: 151 mg/dL — ABNORMAL HIGH (ref 70–99)
Potassium: 3.9 mmol/L (ref 3.5–5.1)
Sodium: 137 mmol/L (ref 135–145)
Total Bilirubin: 0.3 mg/dL (ref 0.3–1.2)
Total Protein: 7.4 g/dL (ref 6.5–8.1)

## 2022-07-29 LAB — CULTURE, BLOOD (ROUTINE X 2)

## 2022-07-29 LAB — SALICYLATE LEVEL: Salicylate Lvl: <7 mg/dL — ABNORMAL LOW (ref 7.0–30.0)

## 2022-07-29 MED ORDER — SODIUM CHLORIDE 0.9 % IV BOLUS
500.0000 mL | Freq: Once | INTRAVENOUS | Status: AC
  Administered 2022-07-29: 500 mL via INTRAVENOUS

## 2022-07-29 MED ORDER — IOHEXOL 350 MG/ML SOLN
75.0000 mL | Freq: Once | INTRAVENOUS | Status: AC | PRN
  Administered 2022-07-29: 75 mL via INTRAVENOUS

## 2022-07-29 NOTE — ED Provider Notes (Signed)
[place] EMERGENCY DEPARTMENT AT [facility] Provider Note   CSN: [id number] Arrival date & time: 07/29/22  2043     History  Chief Complaint  Patient presents with   Wound Infection    Ostomy infection    Ashley Vaughn is a 57 y.o. female.  HPI   Patient with medical history including HIV, CHF EF of 30 to 35%, CVA, diabetes, CKD stage III, status post ileostomy takedown presenting with complaints of pain in her right lower quadrant.  Patient states about 3 days ago, noticed a small bubble where the takedown was, she states it  got larger more painful, and then today it popped and has been draining purulent discharge, states has been feeling chills and has had decreased appetite and bowel movements, last bowel movement was approximately 3 days ago, denies any bloody stools or dark tarry stools she denies any urinary symptoms.  She states that since the discharge of purulent discharge she is feels slightly better but still tenderness in the right lower quadrant she is concerned that there might be an infection present.  I reviewed patient's chart had complicated surgical history, she had perforated appendix in 06/22/21, unfortunately developed rectal cecal abscess, underwent fistula on 12/23, and then underwent takedown 02/09.  Home Medications Prior to Admission medications   Medication Sig Start Date End Date Taking? Authorizing Provider  carvedilol (COREG) 3.125 MG tablet Take 1 tablet (3.125 mg total) by mouth 2 (two) times daily with a meal. 06/12/22  Yes Marrianne Mood, MD  DULoxetine HCl 40 MG CPEP Take 1 capsule (40 mg total) by mouth daily with breakfast. Patient taking differently: Take 40 mg by mouth at bedtime. 06/12/22  Yes Marrianne Mood, MD  Empagliflozin-metFORMIN HCl (SYNJARDY) 12.5-500 MG TABS Take 1 tablet by mouth 2 (two) times daily. 06/12/22  Yes Marrianne Mood, MD  Semaglutide,0.25 or 0.5MG /DOS, 2 MG/3ML SOPN Inject 0.25 mg into the skin once a  week. 06/12/22  Yes Marrianne Mood, MD  Accu-Chek Softclix Lancets lancets Check blood sugar 3 times per day 06/12/22   Marrianne Mood, MD  bictegravir-emtricitabine-tenofovir AF (BIKTARVY) 50-200-25 MG TABS tablet Take 1 tablet by mouth daily. Patient not taking: Reported on 07/29/2022 12/21/21   Blanchard Kelch, NP  Blood Glucose Monitoring Suppl (ACCU-CHEK GUIDE) w/Device KIT To monitor blood sugar, use in the morning before first meal of the day. 06/12/22   Marrianne Mood, MD  glucose blood (ACCU-CHEK GUIDE) test strip Use as instructed 06/12/22   Marrianne Mood, MD  nitrofurantoin, macrocrystal-monohydrate, (MACROBID) 100 MG capsule Take 1 capsule (100 mg total) by mouth 2 (two) times daily. Patient not taking: Reported on 07/29/2022 07/25/22   Horton, Clabe Seal, DO      Allergies    Metformin and related and Fluorescein    Review of Systems   Review of Systems  Constitutional:  Negative for chills and fever.  Respiratory:  Negative for shortness of breath.   Cardiovascular:  Negative for chest pain.  Gastrointestinal:  Positive for abdominal pain. Negative for nausea and vomiting.  Skin:  Positive for rash and wound.  Neurological:  Negative for headaches.    Physical Exam Updated Vital Signs BP 136/69   Pulse 79   Temp 99.5 F (37.5 C) (Oral)   Resp 15   Ht 5\' 2"  (1.575 m)   Wt 53.5 kg   SpO2 100%   BMI 21.57 kg/m  Physical Exam Vitals and nursing note reviewed.  Constitutional:  General: She is not in acute distress.    Appearance: She is not ill-appearing.  HENT:     Head: Normocephalic and atraumatic.     Nose: No congestion.  Eyes:     Conjunctiva/sclera: Conjunctivae normal.  Cardiovascular:     Rate and Rhythm: Normal rate and regular rhythm.     Pulses: Normal pulses.     Heart sounds: No murmur heard.    No friction rub. No gallop.  Pulmonary:     Effort: No respiratory distress.     Breath sounds: No wheezing, rhonchi or rales.   Abdominal:     Palpations: Abdomen is soft.     Tenderness: There is abdominal tenderness. There is no right CVA tenderness or left CVA tenderness.     Comments: Abdomen nondistended, soft, there is is noted surgical scar in the right lower quadrant where takedown was, there is noted purulent drainage from the area, slight erythema, no palpable fluctuant induration present, slightly tender with patient regarding potential peritoneal sign.  Skin:    General: Skin is warm and dry.  Neurological:     Mental Status: She is alert.  Psychiatric:        Mood and Affect: Mood normal.     ED Results / Procedures / Treatments   Labs (all labs ordered are listed, but only abnormal results are displayed) Labs Reviewed  COMPREHENSIVE METABOLIC PANEL - Abnormal; Notable for the following components:      Result Value   CO2 19 (*)    Glucose, Bld 151 (*)    BUN 24 (*)    Creatinine, Ser 1.27 (*)    Albumin 2.2 (*)    GFR, Estimated 50 (*)    Anion gap 19 (*)    All other components within normal limits  CBC WITH DIFFERENTIAL/PLATELET - Abnormal; Notable for the following components:   WBC 14.8 (*)    RBC 5.22 (*)    Hemoglobin 10.7 (*)    HCT 35.4 (*)    MCV 67.8 (*)    MCH 20.5 (*)    Platelets 509 (*)    Neutro Abs 10.0 (*)    Monocytes Absolute 1.4 (*)    Abs Immature Granulocytes 0.10 (*)    All other components within normal limits  SALICYLATE LEVEL - Abnormal; Notable for the following components:   Salicylate Lvl <7.0 (*)    All other components within normal limits  CULTURE, BLOOD (ROUTINE X 2)  CULTURE, BLOOD (ROUTINE X 2)  LACTIC ACID, PLASMA  I-STAT VENOUS BLOOD GAS, ED    EKG EKG Interpretation  Date/Time:  Monday July 29 2022 20:51:42 EDT Ventricular Rate:  90 PR Interval:  127 QRS Duration: 78 QT Interval:  366 QTC Calculation: 448 R Axis:   54 Text Interpretation: Sinus rhythm Confirmed by Vivi Barrack [phone]) on 07/29/2022 11:10:51 PM  Radiology CT  ABDOMEN PELVIS W CONTRAST  Result Date: 07/29/2022 CLINICAL DATA:  Intra-abdominal abscess. Ostomy site infection. Appendectomy 1 year ago with colostomy placement. Ostomy site removed on 04/04/2022. EXAM: CT ABDOMEN AND PELVIS WITH CONTRAST TECHNIQUE: Multidetector CT imaging of the abdomen and pelvis was performed using the standard protocol following bolus administration of intravenous contrast. RADIATION DOSE REDUCTION: This exam was performed according to the departmental dose-optimization program which includes automated exposure control, adjustment of the mA and/or kV according to patient size and/or use of iterative reconstruction technique. CONTRAST:  75mL OMNIPAQUE IOHEXOL 350 MG/ML SOLN COMPARISON:  CT abdomen and pelvis 10/02/2021 FINDINGS:  Lower chest: No acute abnormality. Hepatobiliary: No focal liver abnormality is seen. No gallstones, gallbladder wall thickening, or biliary dilatation. Pancreas: Unremarkable. Spleen: Unremarkable. Adrenals/Urinary Tract: Stable adrenal glands. No urinary calculi or hydronephrosis. Unremarkable bladder. Stomach/Bowel: Appendectomy. Anastomosis in the right lower quadrant. Stomach is within normal limits. Normal caliber large and small bowel. Colonic diverticulosis without diverticulitis. Vascular/Lymphatic: Mild aortic calcified atherosclerosis. No lymphadenopathy. Reproductive: No acute abnormality. Other: Soft tissue thickening and fat stranding about the right lower quadrant ostomy site with intramuscular edema within the right oblique abdominal wall musculature. There is a tract extending from the skin surface to a thick-walled enhancing fluid and gas collection within the right internal oblique musculature extending into the subcutaneous fat of the right posterolateral flank. The fluid and gas collection measures approximately 8.0 x 9.9 x 12.9 cm (series 3/image 54 and 6/83). Adjacent fat stranding about the abscess. There is adjacent intraperitoneal and  retroperitoneal free fluid (circa series 3/image 48-59) without definite intraperitoneal abscess. The abscess abuts the right iliac crest without evidence of osteomyelitis. Small amount of free fluid in the pelvis. Musculoskeletal: No acute fracture. IMPRESSION: 1. Large abscess within the right internal oblique musculature extending into the subcutaneous fat of the right posterolateral flank measuring up to 12.9 cm. There is a tract extending from the skin surface to the abscess at the right lower quadrant ostomy site. 2. Small amount of adjacent intraperitoneal and retroperitoneal free fluid without definite abscess. Aortic Atherosclerosis (ICD10-I70.0). Electronically Signed   By: Minerva Fester M.D.   On: 07/29/2022 23:49    Procedures Procedures    Medications Ordered in ED Medications  piperacillin-tazobactam (ZOSYN) IVPB 3.375 g (0 g Intravenous Stopped 07/30/22 0256)  vancomycin (VANCOREADY) IVPB 1250 mg/250 mL (1,250 mg Intravenous New Bag/Given 07/30/22 0225)  vancomycin (VANCOREADY) IVPB 750 mg/150 mL (has no administration in time range)  sodium chloride 0.9 % bolus 500 mL (0 mLs Intravenous Stopped 07/30/22 0112)  iohexol (OMNIPAQUE) 350 MG/ML injection 75 mL (75 mLs Intravenous Contrast Given 07/29/22 2324)    ED Course/ Medical Decision Making/ A&P                             Medical Decision Making Amount and/or Complexity of Data Reviewed Labs: ordered. Radiology: ordered.  Risk Prescription drug management.   This patient presents to the ED for concern of abdominal infection, this involves an extensive number of treatment options, and is a complaint that carries with it a high risk of complications and morbidity.  The differential diagnosis includes intra-abdominal infection, superficial abscess, bowel obstruction, volvulus,    Additional history obtained:  Additional history obtained from N/A External records from outside source obtained and reviewed including  surgical notes   Co morbidities that complicate the patient evaluation  HIV, diabetes, CHF  Social Determinants of Health:  N/A    Lab Tests:  I Ordered, and personally interpreted labs.  The pertinent results include: CBC shows leukocytosis of 14.8, hemoglobin 10.7, CMP reveals CO2 of 19 glucose 151, creatinine 1.27, GFR 50, anion gap 19, lactic 1.5   Imaging Studies ordered:  I ordered imaging studies including CT and pelvis I independently visualized and interpreted imaging which showed large abdominal abscess measuring 12.9 cm, extends to the ileostomy site. I agree with the radiologist interpretation   Cardiac Monitoring:  The patient was maintained on a cardiac monitor.  I personally viewed and interpreted the cardiac monitored which showed an underlying rhythm of:  N/A   Medicines ordered and prescription drug management:  I ordered medication including antibiotics I have reviewed the patients home medicines and have made adjustments as needed  Critical Interventions:  N/A   Reevaluation:  Presents with concerns of abdominal infection, on my exam she had purulent discharge in the right lower quadrant, I am concerned for possible deep tissue infection/intra-abdominal infection, will obtain screening lab workup, send on for CT imaging for further evaluation.  CT scan shows large abdominal abscess, due to the size of its will consult with general surgery for further recommendations, will also start on broad-spectrum antibiotics.  Reassessed the patient, explained that we [facility] will accept her for further evaluation, patient states that she would like to go back as they are the ones that who initially did the surgery.  Will prepare patient for transfer  Consultations Obtained:  I requested consultation with the Dr. Charmaine Downs [facility] general surgery,  and discussed lab and imaging findings as well as pertinent plan - they recommend: He will accept the  patient in transfer.    Test Considered:  N/A    Rule out Suspicion for volvulus, diverticulitis, bowel obstruction, intra-abdominal mass, intra-abdominal infection is low at this time his CT imaging is negative these findings.  I doubt sepsis is patient nontoxic-appearing, vital signs are reassuring, she does have leukocytosis which I suspect is secondary due to abscess seen on her CT imaging.      Dispostion and problem list  After consideration of the diagnostic results and the patients response to treatment, I feel that the patent would benefit from transfer.   abdominal abscess-currently on antibiotics, will be transferred to [facility] or she will need formal evaluation by surgical team for further management.            Final Clinical Impression(s) / ED Diagnoses Final diagnoses:  Abdominal wall abscess    Rx / DC Orders ED Discharge Orders     None         Carroll Sage, PA-C 07/30/22 0318    Gloris Manchester, MD 07/31/22 [phone]

## 2022-07-29 NOTE — ED Provider Notes (Incomplete)
[name] EMERGENCY DEPARTMENT AT [facility] Provider Note   CSN: [id number] Arrival date & time: 07/29/22  2043     History {Add pertinent medical, surgical, social history, OB history to HPI:1} Chief Complaint  Patient presents with  . Wound Infection    Ostomy infection    Ashley Vaughn is a 57 y.o. female.  HPI   Patient with medical history including HIV, CHF EF of 30 to 35%, CVA, diabetes, CKD stage III, status post ileostomy takedown presenting with complaints of pain in her right lower quadrant.  Patient states about 3 days ago, noticed a small bubble where the takedown was, she states it  got larger more painful, and then today it popped and has been draining purulent discharge, states has been feeling chills and has had decreased appetite and bowel movements, last bowel movement was approximately 3 days ago, denies any bloody stools or dark tarry stools she denies any urinary symptoms.  She states that since the discharge of purulent discharge she is feels slightly better but still tenderness in the right lower quadrant she is concerned that there might be an infection present.  I reviewed patient's chart had complicated surgical history, she had perforated appendix in 06/22/21, unfortunately developed rectal cecal abscess, underwent fistula on 12/23, and then underwent takedown 02/09.  Home Medications Prior to Admission medications   Medication Sig Start Date End Date Taking? Authorizing Provider  bictegravir-emtricitabine-tenofovir AF (BIKTARVY) 50-200-25 MG TABS tablet Take 1 tablet by mouth daily. 12/21/21  Yes Blanchard Kelch, NP  carvedilol (COREG) 3.125 MG tablet Take 1 tablet (3.125 mg total) by mouth 2 (two) times daily with a meal. 06/12/22  Yes Marrianne Mood, MD  clopidogrel (PLAVIX) 75 MG tablet Take 1 tablet (75 mg total) by mouth daily. 09/27/21  Yes Reymundo Poll, MD  DULoxetine HCl 40 MG CPEP Take 1 capsule (40 mg total) by mouth daily with  breakfast. Patient taking differently: Take 40 mg by mouth at bedtime. 06/12/22  Yes Marrianne Mood, MD  Empagliflozin-metFORMIN HCl (SYNJARDY) 12.5-500 MG TABS Take 1 tablet by mouth 2 (two) times daily. 06/12/22  Yes Marrianne Mood, MD  Accu-Chek Softclix Lancets lancets Check blood sugar 3 times per day 06/12/22   Marrianne Mood, MD  Blood Glucose Monitoring Suppl (ACCU-CHEK GUIDE) w/Device KIT To monitor blood sugar, use in the morning before first meal of the day. 06/12/22   Marrianne Mood, MD  glucose blood (ACCU-CHEK GUIDE) test strip Use as instructed 06/12/22   Marrianne Mood, MD  nitrofurantoin, macrocrystal-monohydrate, (MACROBID) 100 MG capsule Take 1 capsule (100 mg total) by mouth 2 (two) times daily. Patient not taking: Reported on 07/29/2022 07/25/22   Horton, Danford Bad M, DO  rosuvastatin (CRESTOR) 20 MG tablet Take 1 tablet (20 mg total) by mouth daily. Patient not taking: Reported on 07/29/2022 11/01/21   Sabharwal, Aditya, DO  sacubitril-valsartan (ENTRESTO) 24-26 MG Take 1 tablet by mouth 2 (two) times daily. Patient not taking: Reported on 07/29/2022 11/20/21   Dorthula Nettles, DO  Semaglutide,0.25 or 0.5MG /DOS, 2 MG/3ML SOPN Inject 0.25 mg into the skin once a week. Patient not taking: Reported on 07/29/2022 06/12/22   Marrianne Mood, MD      Allergies    Metformin and related and Fluorescein    Review of Systems   Review of Systems  Constitutional:  Negative for chills and fever.  Respiratory:  Negative for shortness of breath.   Cardiovascular:  Negative for chest pain.  Gastrointestinal:  Positive for  abdominal pain. Negative for nausea and vomiting.  Skin:  Positive for rash and wound.  Neurological:  Negative for headaches.    Physical Exam Updated Vital Signs BP (!) 151/73   Pulse 86   Temp 99.1 F (37.3 C) (Oral)   Resp 13   Ht 5\' 2"  (1.575 m)   SpO2 100%   BMI 22.50 kg/m  Physical Exam Vitals and nursing note reviewed.  Constitutional:       General: She is not in acute distress.    Appearance: She is not ill-appearing.  HENT:     Head: Normocephalic and atraumatic.     Nose: No congestion.  Eyes:     Conjunctiva/sclera: Conjunctivae normal.  Cardiovascular:     Rate and Rhythm: Normal rate and regular rhythm.     Pulses: Normal pulses.     Heart sounds: No murmur heard.    No friction rub. No gallop.  Pulmonary:     Effort: No respiratory distress.     Breath sounds: No wheezing, rhonchi or rales.  Abdominal:     Palpations: Abdomen is soft.     Tenderness: There is abdominal tenderness. There is no right CVA tenderness or left CVA tenderness.     Comments: Abdomen nondistended, soft, there is is noted surgical scar in the right lower quadrant where takedown was, there is noted purulent drainage from the area, slight erythema, no palpable fluctuant induration present, slightly tender with patient regarding potential peritoneal sign.  Skin:    General: Skin is warm and dry.  Neurological:     Mental Status: She is alert.  Psychiatric:        Mood and Affect: Mood normal.     ED Results / Procedures / Treatments   Labs (all labs ordered are listed, but only abnormal results are displayed) Labs Reviewed  CULTURE, BLOOD (ROUTINE X 2)  CULTURE, BLOOD (ROUTINE X 2)  COMPREHENSIVE METABOLIC PANEL  CBC WITH DIFFERENTIAL/PLATELET  SALICYLATE LEVEL  LACTIC ACID, PLASMA  LACTIC ACID, PLASMA    EKG None  Radiology No results found.  Procedures Procedures  {Document cardiac monitor, telemetry assessment procedure when appropriate:1}  Medications Ordered in ED Medications - No data to display  ED Course/ Medical Decision Making/ A&P   {   Click here for ABCD2, HEART and other calculatorsREFRESH Note before signing :1}                          Medical Decision Making Amount and/or Complexity of Data Reviewed Labs: ordered. Radiology: ordered.  Risk Prescription drug management.   This patient  presents to the ED for concern of abdominal infection, this involves an extensive number of treatment options, and is a complaint that carries with it a high risk of complications and morbidity.  The differential diagnosis includes intra-abdominal infection, superficial abscess, bowel obstruction, volvulus,    Additional history obtained:  Additional history obtained from N/A External records from outside source obtained and reviewed including surgical notes   Co morbidities that complicate the patient evaluation  HIV, diabetes, CHF  Social Determinants of Health:  N/A    Lab Tests:  I Ordered, and personally interpreted labs.  The pertinent results include:  ***   Imaging Studies ordered:  I ordered imaging studies including ***  I independently visualized and interpreted imaging which showed *** I agree with the radiologist interpretation   Cardiac Monitoring:  The patient was maintained on a cardiac monitor.  I personally viewed and interpreted the cardiac monitored which showed an underlying rhythm of: ***   Medicines ordered and prescription drug management:  I ordered medication including ***  for ***  I have reviewed the patients home medicines and have made adjustments as needed  Critical Interventions:  ***   Reevaluation:  Presents with concerns of abdominal infection, on my exam she had purulent discharge in the right lower quadrant, I am concerned for possible deep tissue infection/intra-abdominal infection, will obtain screening lab workup, send on for CT imaging for further evaluation.  Consultations Obtained:  I requested consultation with the ***,  and discussed lab and imaging findings as well as pertinent plan - they recommend: ***    Test Considered:  ***    Rule out ****    Dispostion and problem list  After consideration of the diagnostic results and the patients response to treatment, I feel that the patent would benefit from  ***.       {Document critical care time when appropriate:1} {Document review of labs and clinical decision tools ie heart score, Chads2Vasc2 etc:1}  {Document your independent review of radiology images, and any outside records:1} {Document your discussion with family members, caretakers, and with consultants:1} {Document social determinants of health affecting pt's care:1} {Document your decision making why or why not admission, treatments were needed:1} Final Clinical Impression(s) / ED Diagnoses Final diagnoses:  None    Rx / DC Orders ED Discharge Orders     None

## 2022-07-29 NOTE — ED Triage Notes (Signed)
Pt arrives to ED c/o ostomy site infection. Pt reports appendectomy 1 year ago with colostomy placement. Ostomy site was removed on 04/04/2022. Pt reported pain in abdominal region when site opened today. Stool drainage noted.

## 2022-07-29 NOTE — Hospital Course (Signed)
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 S/p ileocecectomy w/ primary anastomosis (03/29/2022).    She presents with pain in the RLQ of her abdomen.    About 3d ago patient noticed a small bubble near the incision where she recently underwent laparascopic ileocecectomy. She states that the bubble got progressively larger and more painful and eventually started draining purulent appearing discharge. She notes chills without fever, decreased appetite, less frequent Bms. She denies hematochezia, melena, or dysuria.

## 2022-07-30 DIAGNOSIS — L02211 Cutaneous abscess of abdominal wall: Secondary | ICD-10-CM | POA: Diagnosis not present

## 2022-07-30 LAB — CULTURE, BLOOD (ROUTINE X 2): Culture: NO GROWTH

## 2022-07-30 MED ORDER — PIPERACILLIN-TAZOBACTAM 3.375 G IVPB
3.3750 g | Freq: Three times a day (TID) | INTRAVENOUS | Status: DC
  Administered 2022-07-30 (×2): 3.375 g via INTRAVENOUS
  Filled 2022-07-30 (×2): qty 50

## 2022-07-30 MED ORDER — VANCOMYCIN HCL 750 MG/150ML IV SOLN: 750.0000 mg | INTRAVENOUS | Status: DC

## 2022-07-30 MED ORDER — VANCOMYCIN HCL 1250 MG/250ML IV SOLN
1250.0000 mg | Freq: Once | INTRAVENOUS | Status: AC
  Administered 2022-07-30: 1250 mg via INTRAVENOUS
  Filled 2022-07-30: qty 250

## 2022-07-30 NOTE — ED Notes (Signed)
ETA 35 minute [facility] pick up pt.

## 2022-07-30 NOTE — Progress Notes (Addendum)
Pharmacy Antibiotic Note  Ashley Vaughn is a 57 y.o. female admitted on 07/29/2022 with  right lower quadrant pain/ostomy site infection . Her pertinent PSH includes persistent cecal fistula s/p drain placement after appendectomy for perforated appendicitis and underwent ileocecectomy with primary anastomosis with takedown of ostomy. She noticed a small bubble where takedown of ostomy bag was and states got larger and popped today with purulent discharge. Last bowel movement ~3 days ag oPharmacy has been consulted for zosyn and vancomycin dosing. WBC 14.8, Lactate 1.5, afebrile, sCr 1.27 (down from 1.93)  Plan: Zosyn 3.375g IV q8h (4 hour infusion). Vancomycin 1250mg  IV x1 Vancomycin 750 mg IV every 24 hours (AUC 528, Vd 0.72, sCr 1.27) Monitor renal function, WBC, fever curve, signs of clinical improvement Follow up surgical plans and source control  Height: 5\' 2"  (157.5 cm) IBW/kg (Calculated) : 50.1  Temp (24hrs), Avg:99.1 F (37.3 C), Min:99.1 F (37.3 C), Max:99.1 F (37.3 C)  Recent Labs  Lab 07/25/22 1242 07/25/22 1431 07/29/22 2108  WBC 17.4*  --  14.8*  CREATININE 1.93*  --  1.27*  LATICACIDVEN  --  1.3 1.5    CrCl cannot be calculated (Unknown ideal weight.).    Allergies  Allergen Reactions   Metformin And Related     Unknown reaction    Fluorescein Itching and Rash    INTRAVENOUSLY    Antimicrobials this admission: Zosyn 6/11 >>  Vancomycin 6/11 >>  Microbiology results: 6/11 BCx:   Thank you for allowing pharmacy to be a part of this patient's care.  Arabella Merles, PharmD. [facility] Acute Care PGY-1 07/30/2022 1:24 AM

## 2022-07-30 NOTE — ED Notes (Signed)
Dressing, chux and gown changed.  Pt continues to drain.

## 2022-08-01 LAB — CULTURE, BLOOD (ROUTINE X 2): Culture: NO GROWTH

## 2022-08-02 LAB — CULTURE, BLOOD (ROUTINE X 2): Special Requests: ADEQUATE

## 2022-08-03 LAB — CULTURE, BLOOD (ROUTINE X 2)
Culture: NO GROWTH
Special Requests: ADEQUATE

## 2022-08-05 ENCOUNTER — Telehealth: Payer: Self-pay

## 2022-08-05 NOTE — Transitions of Care (Post Inpatient/ED Visit) (Unsigned)
   08/05/2022  Name: Ashley Vaughn MRN: [id number] DOB: 07-04-1965  Today's TOC FU Call Status: Today's TOC FU Call Status:: Unsuccessul Call (1st Attempt) Unsuccessful Call (1st Attempt) Date: 08/05/22  Attempted to reach the patient regarding the most recent Inpatient/ED visit.  Follow Up Plan: Additional outreach attempts will be made to reach the patient to complete the Transitions of Care (Post Inpatient/ED visit) call.   Signature Karena Addison, LPN [facility] Nurse Health Advisor Direct Dial [phone]

## 2022-08-06 NOTE — Transitions of Care (Post Inpatient/ED Visit) (Signed)
08/06/2022  Name: Ashley Vaughn MRN: [id number] DOB: November 17, 1965  Today's TOC FU Call Status: Today's TOC FU Call Status:: Successful TOC FU Call Competed Unsuccessful Call (1st Attempt) Date: 08/05/22 [facility] FU Call Complete Date: 08/06/22  Transition Care Management Follow-up Telephone Call Date of Discharge: 08/03/22 Discharge Facility: Other ([facility]) Name of Other ([facility]) Discharge Facility: [facility] Type of Discharge: Inpatient Admission Primary Inpatient Discharge Diagnosis:: abscess abd wall How have you been since you were released from the hospital?: Better Any questions or concerns?: No  Items Reviewed: Did you receive and understand the discharge instructions provided?: Yes Medications obtained,verified, and reconciled?: Yes (Medications Reviewed) Any new allergies since your discharge?: No Dietary orders reviewed?: Yes Do you have support at home?: Yes People in Home: child(ren), adult  Medications Reviewed Today: Medications Reviewed Today     Reviewed by Karena Addison, LPN (Licensed Practical Nurse) on 08/06/22 at 1530  Med List Status: <None>   Medication Order Taking? Sig Documenting Provider Last Dose Status Informant  Accu-Chek Softclix Lancets lancets [id number]  Check blood sugar 3 times per day Marrianne Mood, MD  Active Self  bictegravir-emtricitabine-tenofovir AF (BIKTARVY) 50-200-25 MG TABS tablet [id number] No Take 1 tablet by mouth daily.  Patient not taking: Reported on 07/29/2022   Blanchard Kelch, NP Not Taking Active Self           Med Note [facility] [place] Jul 29, 2022 10:51 PM) Patient said that she needs a refill and not sure how long its been since she had it  Blood Glucose Monitoring Suppl (ACCU-CHEK GUIDE) w/Device KIT [id number]  To monitor blood sugar, use in the morning before first meal of the day. Marrianne Mood, MD  Active Self  carvedilol (COREG) 3.125 MG tablet [id number] No Take 1 tablet (3.125 mg total) by mouth 2  (two) times daily with a meal. Marrianne Mood, MD UNKNOWN Active Self  DULoxetine HCl 40 MG CPEP [id number] No Take 1 capsule (40 mg total) by mouth daily with breakfast.  Patient taking differently: Take 40 mg by mouth at bedtime.   Marrianne Mood, MD 07/26/2022 pm Active Self  Empagliflozin-metFORMIN HCl (SYNJARDY) 12.5-500 MG TABS [id number] No Take 1 tablet by mouth 2 (two) times daily. Marrianne Mood, MD UNKNOWN Active Self  glucose blood (ACCU-CHEK GUIDE) test strip [id number]  Use as instructed Marrianne Mood, MD  Active Self  nitrofurantoin, macrocrystal-monohydrate, (MACROBID) 100 MG capsule [id number] No Take 1 capsule (100 mg total) by mouth 2 (two) times daily.  Patient not taking: Reported on 07/29/2022   HortonClabe Seal, DO Not Taking Active Self  Semaglutide,0.25 or 0.5MG /DOS, 2 MG/3ML SOPN [id number] No Inject 0.25 mg into the skin once a week. Marrianne Mood, MD UNKNOWN Active Self            Home Care and Equipment/Supplies: Were Home Health Services Ordered?: NA Any new equipment or medical supplies ordered?: NA  Functional Questionnaire: Do you need assistance with bathing/showering or dressing?: Yes Do you need assistance with meal preparation?: Yes Do you need assistance with eating?: No Do you have difficulty maintaining continence: Yes Do you need assistance with getting out of bed/getting out of a chair/moving?: Yes Do you have difficulty managing or taking your medications?: Yes  Follow up appointments reviewed: PCP Follow-up appointment confirmed?: No (no avail appt times, sent message to staff to schedule) MD Provider Line Number:[phone] Given: No Specialist Hospital Follow-up appointment confirmed?: Yes Date of Specialist follow-up appointment?: 08/27/22 Follow-Up Specialty Provider::  surgeon Do you need transportation to your follow-up appointment?: No Do you understand care options if your condition(s) worsen?: Yes-patient verbalized  understanding    SIGNATURE Karena Addison, LPN [facility] Nurse Health Advisor Direct Dial [phone]

## 2022-08-12 ENCOUNTER — Other Ambulatory Visit: Payer: Self-pay | Admitting: Student

## 2022-08-23 ENCOUNTER — Encounter (HOSPITAL_COMMUNITY): Payer: BLUE CROSS/BLUE SHIELD | Admitting: Cardiology

## 2022-08-26 ENCOUNTER — Telehealth: Payer: Self-pay

## 2022-08-26 NOTE — Telephone Encounter (Signed)
Second half came through .Marland Kitchen... Was submitted with  last office notes and labs .Marland Kitchen Awaiting approval or denial

## 2022-08-26 NOTE — Telephone Encounter (Signed)
Pa for pt ( DULOXETINE  HCL 40 MG DR CAP ) came through on cover my meds .Marland Kitchen Was started  awaiting the questions section of the PA    KEY ( [id number])

## 2022-08-28 ENCOUNTER — Encounter: Payer: BLUE CROSS/BLUE SHIELD | Admitting: Student

## 2022-08-30 ENCOUNTER — Telehealth (HOSPITAL_COMMUNITY): Payer: Self-pay | Admitting: Licensed Clinical Social Worker

## 2022-08-30 NOTE — Telephone Encounter (Signed)
H&V Care Navigation CSW Progress Note  Clinical Social Worker received call from pt requesting assistance with transportation to upcoming appt to clinic.  CSW able to arrange transportation using [employer].  Pt has [employer] medicaid so CSW emailed case workers with their program to assist pt with future transportation needs.   SDOH Screenings   Food Insecurity: Medium Risk (07/30/2022)   Received from [facility]  Housing: High Risk (06/12/2022)  Transportation Needs: Unmet Transportation Needs (08/30/2022)  Utilities: Unknown (07/30/2022)   [facility]  Alcohol Screen: Low Risk  (01/02/2022)  Depression (PHQ2-9): High Risk (06/12/2022)  Financial Resource Strain: High Risk (11/01/2021)  Social Connections: Socially Isolated (01/02/2022)  Tobacco Use: Medium Risk (07/25/2022)   Burna Sis, LCSW Clinical Social Worker [facility] Desk#: [phone] Cell#: [phone]

## 2022-09-02 ENCOUNTER — Telehealth (HOSPITAL_COMMUNITY): Payer: Self-pay | Admitting: Licensed Clinical Social Worker

## 2022-09-02 ENCOUNTER — Other Ambulatory Visit (HOSPITAL_COMMUNITY): Payer: Self-pay

## 2022-09-02 ENCOUNTER — Encounter (HOSPITAL_COMMUNITY): Payer: Self-pay | Admitting: Cardiology

## 2022-09-02 ENCOUNTER — Ambulatory Visit (HOSPITAL_COMMUNITY)
Admission: RE | Admit: 2022-09-02 | Discharge: 2022-09-02 | Disposition: A | Discharge status: Home / Self Care | Payer: MEDICAID | Source: Ambulatory Visit | Attending: Cardiology | Admitting: Cardiology

## 2022-09-02 VITALS — BP 124/60 | HR 94 | Wt 122.6 lb

## 2022-09-02 DIAGNOSIS — I110 Hypertensive heart disease with heart failure: Secondary | ICD-10-CM | POA: Insufficient documentation

## 2022-09-02 DIAGNOSIS — Z7985 Long-term (current) use of injectable non-insulin antidiabetic drugs: Secondary | ICD-10-CM | POA: Insufficient documentation

## 2022-09-02 DIAGNOSIS — Z5982 Transportation insecurity: Secondary | ICD-10-CM | POA: Insufficient documentation

## 2022-09-02 DIAGNOSIS — I5020 Unspecified systolic (congestive) heart failure: Secondary | ICD-10-CM | POA: Diagnosis present

## 2022-09-02 DIAGNOSIS — I491 Atrial premature depolarization: Secondary | ICD-10-CM | POA: Insufficient documentation

## 2022-09-02 DIAGNOSIS — Z79899 Other long term (current) drug therapy: Secondary | ICD-10-CM | POA: Insufficient documentation

## 2022-09-02 DIAGNOSIS — Z8249 Family history of ischemic heart disease and other diseases of the circulatory system: Secondary | ICD-10-CM | POA: Diagnosis not present

## 2022-09-02 DIAGNOSIS — Z794 Long term (current) use of insulin: Secondary | ICD-10-CM | POA: Insufficient documentation

## 2022-09-02 DIAGNOSIS — Z8673 Personal history of transient ischemic attack (TIA), and cerebral infarction without residual deficits: Secondary | ICD-10-CM | POA: Insufficient documentation

## 2022-09-02 DIAGNOSIS — Z87891 Personal history of nicotine dependence: Secondary | ICD-10-CM | POA: Diagnosis not present

## 2022-09-02 DIAGNOSIS — I5022 Chronic systolic (congestive) heart failure: Secondary | ICD-10-CM | POA: Diagnosis not present

## 2022-09-02 DIAGNOSIS — Z5986 Financial insecurity: Secondary | ICD-10-CM | POA: Insufficient documentation

## 2022-09-02 DIAGNOSIS — K651 Peritoneal abscess: Secondary | ICD-10-CM | POA: Insufficient documentation

## 2022-09-02 DIAGNOSIS — Z21 Asymptomatic human immunodeficiency virus [HIV] infection status: Secondary | ICD-10-CM | POA: Insufficient documentation

## 2022-09-02 DIAGNOSIS — E1165 Type 2 diabetes mellitus with hyperglycemia: Secondary | ICD-10-CM | POA: Insufficient documentation

## 2022-09-02 DIAGNOSIS — Z7984 Long term (current) use of oral hypoglycemic drugs: Secondary | ICD-10-CM | POA: Diagnosis not present

## 2022-09-02 DIAGNOSIS — Z833 Family history of diabetes mellitus: Secondary | ICD-10-CM | POA: Diagnosis not present

## 2022-09-02 DIAGNOSIS — I428 Other cardiomyopathies: Secondary | ICD-10-CM | POA: Insufficient documentation

## 2022-09-02 DIAGNOSIS — E118 Type 2 diabetes mellitus with unspecified complications: Secondary | ICD-10-CM

## 2022-09-02 LAB — CBC
HCT: 24.7 % — ABNORMAL LOW (ref 36.0–46.0)
Hemoglobin: 7.4 g/dL — ABNORMAL LOW (ref 12.0–15.0)
MCH: 20.3 pg — ABNORMAL LOW (ref 26.0–34.0)
MCHC: 30 g/dL (ref 30.0–36.0)
MCV: 67.9 fL — ABNORMAL LOW (ref 80.0–100.0)
Platelets: 529 10*3/uL — ABNORMAL HIGH (ref 150–400)
RBC: 3.64 MIL/uL — ABNORMAL LOW (ref 3.87–5.11)
RDW: 15.8 % — ABNORMAL HIGH (ref 11.5–15.5)
WBC: 9.9 10*3/uL (ref 4.0–10.5)
nRBC: 0.2 % (ref 0.0–0.2)

## 2022-09-02 LAB — BASIC METABOLIC PANEL
Anion gap: 10 (ref 5–15)
BUN: 18 mg/dL (ref 6–20)
CO2: 23 mmol/L (ref 22–32)
Calcium: 8.4 mg/dL — ABNORMAL LOW (ref 8.9–10.3)
Chloride: 100 mmol/L (ref 98–111)
Creatinine, Ser: 1.48 mg/dL — ABNORMAL HIGH (ref 0.44–1.00)
GFR, Estimated: 41 mL/min — ABNORMAL LOW (ref >60)
Glucose, Bld: 448 mg/dL — ABNORMAL HIGH (ref 70–99)
Potassium: 4.3 mmol/L (ref 3.5–5.1)
Sodium: 133 mmol/L — ABNORMAL LOW (ref 135–145)

## 2022-09-02 LAB — BRAIN NATRIURETIC PEPTIDE: B Natriuretic Peptide: 89.2 pg/mL (ref 0.0–100.0)

## 2022-09-02 MED ORDER — ENTRESTO 24-26 MG PO TABS: 1.0000 | ORAL_TABLET | Freq: Two times a day (BID) | ORAL | Status: DC

## 2022-09-02 NOTE — Progress Notes (Signed)
ADVANCED HEART FAILURE CLINIC NOTE  Referring Physician: Dr. Marrianne Mood Primary Care: Dr. Benito Mccreedy Primary Cardiologist: N/A  HPI: Ashley Vaughn is a 57 y.o. female with uncontrolled T2DM, well controlled HIV (last viral load undetectable), stroke (6/23), recently diagnosed HFrEF, HTN and intra-abdominal abscess s/p IR drain presenting today for follow up. According to Mrs. Kalisz she has very limited resources at home. She currently lives with her daughter. Earlier this year she had an appendectomy that was complicated by a postoperative abscess requiring a percutaneous drainage catheter on 07/14/21. 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 In late May 2023, she was admitted to [facility] after having a seizure-like episode with motor/sensory deficits. MRI confirmed with right MCA/ACA infarcts. TTE at that time also confirmed newly diagnosed HFrEF w/ LVEF 30%-35%. She was unable to start much GDMT or undergo ischemic evaluation at the time due to an AKI.   Interval hx:  - Missed several follow ups due to lack of transport - Since her last appointment, she has undergone reversal of her ostomy, however, now has wound breakdown at her right lower flank.  - From  HFrEF standpoint, doing fairly well. Minimal dyspnea or LE edema; becomes fatigued quickly from deconditioning.  - Significant socio-economic hurdles at this time; eating small meals with food stamps, has trouble with transport.   Activity level/exercise tolerance:  NYHA IIB Orthopnea:  Sleeps on 2 pillows Paroxysmal noctural dyspnea:  No Chest pain/pressure:  No Orthostatic lightheadedness:  No Palpitations:  Minimal Lower extremity edema:  No Presyncope/syncope:  No Cough:  No   Past Medical History:  Diagnosis Date   AKI (acute kidney injury) (HCC) 08/24/2021   CVA (cerebral vascular accident) (HCC) 07/20/2021   Depression    Diabetes  mellitus    Fistula    Heart failure (HCC)    HIV infection (HCC)    HSV (herpes simplex virus) anogenital infection 03/12/2019   Postoperative infection 07/18/2021   Patient had appendectomy early in May, followed by a hospitalization and drain placement for an intrabdominal infection and abscess.   Postprocedural intraabdominal abscess 07/20/2021   Intra-abdominal abscess with fistulous communication between abscess cavity and cecum.   S/P laparoscopic appendectomy 06/22/2021   Seizure (HCC) 07/18/2021    Current Outpatient Medications  Medication Sig Dispense Refill   Accu-Chek Softclix Lancets lancets Check blood sugar 3 times per day 270 each 3   bictegravir-emtricitabine-tenofovir AF (BIKTARVY) 50-200-25 MG TABS tablet Take 1 tablet by mouth daily. (Patient not taking: Reported on 07/29/2022) 30 tablet 11   Blood Glucose Monitoring Suppl (ACCU-CHEK GUIDE) w/Device KIT To monitor blood sugar, use in the morning before first meal of the day. 1 kit 3   carvedilol (COREG) 3.125 MG tablet Take 1 tablet (3.125 mg total) by mouth 2 (two) times daily with a meal. 60 tablet 3   DULoxetine HCl 40 MG CPEP Take 1 capsule (40 mg total) by mouth daily with breakfast. (Patient taking differently: Take 40 mg by mouth at bedtime.) 60 capsule 11   Empagliflozin-metFORMIN HCl (SYNJARDY) 12.5-500 MG TABS Take 1 tablet by mouth 2 (two) times daily. 60 tablet 3   glucose blood (ACCU-CHEK GUIDE) test strip Use as instructed 100 each 12   nitrofurantoin, macrocrystal-monohydrate, (MACROBID) 100 MG capsule Take 1 capsule (100 mg total) by mouth 2 (two) times daily. (Patient not taking: Reported on 07/29/2022) 14 capsule 0   Semaglutide,0.25 or 0.5MG /DOS,  2 MG/3ML SOPN Inject 0.25 mg into the skin once a week. 3 mL 3   No current facility-administered medications for this encounter.    Allergies  Allergen Reactions   Metformin And Related     Unknown reaction    Fluorescein Itching and Rash     INTRAVENOUSLY      Social History   Socioeconomic History   Marital status: Legally Separated    Spouse name: Not on file   Number of children: Not on file   Years of education: Not on file   Highest education level: Not on file  Occupational History   Not on file  Tobacco Use   Smoking status: Never    Passive exposure: Current   Smokeless tobacco: Never  Vaping Use   Vaping status: Never Used  Substance and Sexual Activity   Alcohol use: Not Currently    Comment: occasional   Drug use: No    Comment: h/o cocaine abuse, clean since 2009   Sexual activity: Never    Partners: Male    Comment: pt. declined condoms  Other Topics Concern   Not on file  Social History Narrative   Not on file   Social Determinants of Health   Financial Resource Strain: High Risk (11/01/2021)   Overall Financial Resource Strain (CARDIA)    Difficulty of Paying Living Expenses: Very hard  Food Insecurity: Medium Risk (07/30/2022)   [facility]   Food vital sign    Within the past 12 months, you worried that your food would run out before you got money to buy more: Sometimes true    Within the past 12 months, the food you bought just didn't last and you didn't have money to get more. : Sometimes true  Transportation Needs: Unmet Transportation Needs (08/30/2022)   PRAPARE - Administrator, Civil Service (Medical): Yes    Lack of Transportation (Non-Medical): Yes  Physical Activity: Not on file  Stress: Not on file  Social Connections: Socially Isolated (01/02/2022)   Social Connection and Isolation Panel [NHANES]    Frequency of Communication with Friends and Family: Once a week    Frequency of Social Gatherings with Friends and Family: Once a week    Attends Religious Services: Never    Database administrator or Organizations: No    Attends Banker Meetings: Never    Marital Status: Separated  Intimate Partner Violence: Not At Risk  (06/12/2022)   Humiliation, Afraid, Rape, and Kick questionnaire    Fear of Current or Ex-Partner: No    Emotionally Abused: No    Physically Abused: No    Sexually Abused: No  Recent Concern: Intimate Partner Violence - High Risk (03/25/2022)   Received from [facility] visits prior to 04/20/2022.   Safety    How often does anyone, including family and friends, physically hurt you?: Never    How often does anyone, including family and friends, insult or talk down to you?: Fairly often    How often does anyone, including family and friends, threaten you with harm?: Never    How often does anyone, including family and friends, scream or curse at you?: Sometimes      Family History  Problem Relation Age of Onset   Diabetes Mother    Hypertension Mother    Heart disease Father    Arthritis Father    Diabetes Maternal Uncle     PHYSICAL EXAM:  Vitals:   09/02/22 1511  BP: 124/60  Pulse: 94  SpO2: 99%   GENERAL: thin chronically ill AAF HEENT: Negative for arcus senilis or xanthelasma. There is no scleral icterus.  The mucous membranes are pink and moist.   NECK: Supple, No masses. Normal carotid upstrokes without bruits. No masses or thyromegaly.    CHEST: There are no chest wall deformities. There is no chest wall tenderness. Respirations are unlabored.  Lungs- CTA B/L CARDIAC:  JVP: 7 cm          Normal rate with regular rhythm. No murmurs, rubs or gallops.  Pulses are 2+ and symmetrical in upper and lower extremities. No edema.  ABDOMEN: Soft, non-tender, non-distended. There are no masses or hepatomegaly. There are normal bowel sounds.  Right lower flank with wound breakdown; no purulent drainage noted.  EXTREMITIES: Warm and well perfused with no cyanosis, clubbing.  LYMPHATIC: No axillary or supraclavicular lymphadenopathy.  NEUROLOGIC: Patient is oriented x3 with no focal or lateralizing neurologic deficits.  PSYCH: Patients affect is appropriate, there  is no evidence of anxiety or depression.  SKIN: Warm and dry; no lesions or wounds.    DATA REVIEW  ECG: NSR as per my read  ECHO: 07/21/21: LVEF 30-35%,  07/23/21 (TEE): LVEF 30-35%; normal RV function, small right to left shunt.  ASSESSMENT & PLAN:  NYHA IIB, Heart failure with reduced ejection fraction Etiology of HF: Nonischemic CMP; plan on repeat TTE at follow up. >1 year since her last echo due to transport/insurance problems. NYHA class / AHA Stage: II Volume status & Diuretics: Euvolemic - hypovolemic Vasodilators: start entresto 24/26mg  BID Beta-Blocker: coreg 3.125mg BID MRA: continue spironolactone 12.5mg  daily Cardiometabolic: jardiance 25mg  daily Devices therapies & Valvulopathies: No valvular disease; will continue GDMT before consideration for primary prevention ICD Advanced therapies: Not a candidate at this time.  2. CVA - MRI w/ right MCA/ACA infarcts  3. Uncontrolled T2DM - Following with internal medicine - Jardiance 25mg  daily  - Continue insulin - A1C 11.5 in 4/24; coming down.   4. HIV - Well controlled CD4; undetectable viral load  5. Intra-abdominal abscess - followed by general surgery.  6. Tobacco use - Stopped smoking in 2008  Follow-up:  27m  Cintia Gleed [facility] Trans[facility] Cardiology

## 2022-09-02 NOTE — Progress Notes (Signed)
H&V Care Navigation CSW Progress Note  Clinical Social Worker consulted to help pt with transportation to upcoming surgery follow up on 7/18 with [facility] in [facility].  Pt unfortunately too close to appt to utilize Medicaid transport.  Pt will talk with dtr about taking- informed her if she can take her she needs to call [facility] and reschedule appt then call me so we can arrange transportation- pt expressed understanding.  CSW sent another message to [facility] for assistance with transport to appt.   SDOH Screenings   Food Insecurity: Medium Risk (07/30/2022)   Received from [facility], [facility]  Housing: High Risk (06/12/2022)  Transportation Needs: Unmet Transportation Needs (09/02/2022)  Utilities: Unknown (07/30/2022)   [facility]  Alcohol Screen: Low Risk  (01/02/2022)  Depression (PHQ2-9): High Risk (06/12/2022)  Financial Resource Strain: High Risk (11/01/2021)  Social Connections: Socially Isolated (01/02/2022)  Tobacco Use: Medium Risk (09/02/2022)     Burna Sis, LCSW Clinical Social Worker [facility] Desk#: [phone] Cell#: [phone]

## 2022-09-02 NOTE — Patient Instructions (Signed)
Medication Changes:  Restart Entresto 24/26 mg Twice daily (we have given you samples)  Lab Work:  Labs done today, your results will be available in MyChart, we will contact you for abnormal readings.  Testing/Procedures:  Your physician has requested that you have an echocardiogram. Echocardiography is a painless test that uses sound waves to create images of your heart. It provides your doctor with information about the size and shape of your heart and how well your heart's chambers and valves are working. This procedure takes approximately one hour. There are no restrictions for this procedure. Please do NOT wear cologne, perfume, aftershave, or lotions (deodorant is allowed). Please arrive 15 minutes prior to your appointment time.  Referrals:  none  Special Instructions // Education:  Do the following things EVERYDAY: Weigh yourself in the morning before breakfast. Write it down and keep it in a log. Take your medicines as prescribed Eat low salt foods--Limit salt (sodium) to 2000 mg per day.  Stay as active as you can everyday Limit all fluids for the day to less than 2 liters   Follow-Up in: 2 months  At the [facility], you and your health needs are our priority. We have a designated team specialized in the treatment of Heart Failure. This Care Team includes your primary Heart Failure Specialized Cardiologist (physician), Advanced Practice Providers (APPs- Physician Assistants and Nurse Practitioners), and Pharmacist who all work together to provide you with the care you need, when you need it.   You may see any of the following providers on your designated Care Team at your next follow up:  Dr. Arvilla Meres Dr. Marca Ancona Dr. Marcos Eke, NP Robbie Lis, [place] [facility] [place], [place] Brynda Peon, NP Karle Plumber, PharmD   Please be sure to bring in all your medications bottles to every appointment.   Need to  Contact us:  If you have any questions or concerns before your next appointment please send [place] a message through [place] or call our office at [phone].    TO LEAVE A MESSAGE FOR THE NURSE SELECT OPTION 2, PLEASE LEAVE A MESSAGE INCLUDING: YOUR NAME DATE OF BIRTH CALL BACK NUMBER REASON FOR CALL**this is important as we prioritize the call backs  YOU WILL RECEIVE A CALL BACK THE SAME DAY AS LONG AS YOU CALL BEFORE 4:00 PM

## 2022-09-02 NOTE — Telephone Encounter (Signed)
H&V Care Navigation CSW Progress Note  Clinical Social Worker called pt and reminded of appt today as well as transportation with [employer] that will pick her up at 2:40pm- pt expressed understanding and reports no barriers to attending appt.   SDOH Screenings   Food Insecurity: Medium Risk (07/30/2022)   Received from [facility], [facility]  Housing: High Risk (06/12/2022)  Transportation Needs: Unmet Transportation Needs (08/30/2022)  Utilities: Unknown (07/30/2022)   [facility]  Alcohol Screen: Low Risk  (01/02/2022)  Depression (PHQ2-9): High Risk (06/12/2022)  Financial Resource Strain: High Risk (11/01/2021)  Social Connections: Socially Isolated (01/02/2022)  Tobacco Use: Medium Risk (07/25/2022)   Burna Sis, LCSW Clinical Social Worker [facility] Desk#: [phone] Cell#: [phone]

## 2022-09-02 NOTE — Progress Notes (Signed)
Medication Samples have been provided to the patient.  Drug name: Sherryll Burger       Strength: 24/26mg         Qty: 2  LOT: [id number]  Exp.Date: 09/2023  Dosing instructions: Take 1 tab Twice daily   The patient has been instructed regarding the correct time, dose, and frequency of taking this medication, including desired effects and most common side effects.   Azreal Stthomas 3:45 PM 09/02/2022

## 2022-09-04 ENCOUNTER — Ambulatory Visit: Payer: BLUE CROSS/BLUE SHIELD | Admitting: Student

## 2022-09-04 ENCOUNTER — Other Ambulatory Visit: Payer: Self-pay | Admitting: Internal Medicine

## 2022-09-04 VITALS — BP 108/61 | HR 88 | Temp 99.4°F | Ht 62.0 in | Wt 119.8 lb

## 2022-09-04 DIAGNOSIS — D649 Anemia, unspecified: Secondary | ICD-10-CM

## 2022-09-04 DIAGNOSIS — Z794 Long term (current) use of insulin: Secondary | ICD-10-CM | POA: Diagnosis not present

## 2022-09-04 DIAGNOSIS — Z7984 Long term (current) use of oral hypoglycemic drugs: Secondary | ICD-10-CM

## 2022-09-04 DIAGNOSIS — E1140 Type 2 diabetes mellitus with diabetic neuropathy, unspecified: Secondary | ICD-10-CM

## 2022-09-04 DIAGNOSIS — R634 Abnormal weight loss: Secondary | ICD-10-CM

## 2022-09-04 DIAGNOSIS — N179 Acute kidney failure, unspecified: Secondary | ICD-10-CM

## 2022-09-04 DIAGNOSIS — I5020 Unspecified systolic (congestive) heart failure: Secondary | ICD-10-CM

## 2022-09-04 DIAGNOSIS — L02211 Cutaneous abscess of abdominal wall: Secondary | ICD-10-CM | POA: Diagnosis not present

## 2022-09-04 DIAGNOSIS — Z0000 Encounter for general adult medical examination without abnormal findings: Secondary | ICD-10-CM

## 2022-09-04 LAB — CBC
HCT: 29.4 % — ABNORMAL LOW (ref 36.0–46.0)
Hemoglobin: 8.7 g/dL — ABNORMAL LOW (ref 12.0–15.0)
MCH: 20.4 pg — ABNORMAL LOW (ref 26.0–34.0)
MCHC: 29.6 g/dL — ABNORMAL LOW (ref 30.0–36.0)
MCV: 68.9 fL — ABNORMAL LOW (ref 80.0–100.0)
Platelets: 590 10*3/uL — ABNORMAL HIGH (ref 150–400)
RBC: 4.27 MIL/uL (ref 3.87–5.11)
RDW: 16 % — ABNORMAL HIGH (ref 11.5–15.5)
WBC: 14 10*3/uL — ABNORMAL HIGH (ref 4.0–10.5)
nRBC: 0 % (ref 0.0–0.2)

## 2022-09-04 LAB — POCT GLYCOSYLATED HEMOGLOBIN (HGB A1C): Hemoglobin A1C: 10.1 % — AB (ref 4.0–5.6)

## 2022-09-04 LAB — BASIC METABOLIC PANEL
Anion gap: 15 (ref 5–15)
BUN: 12 mg/dL (ref 6–20)
CO2: 23 mmol/L (ref 22–32)
Calcium: 9.4 mg/dL (ref 8.9–10.3)
Chloride: 100 mmol/L (ref 98–111)
Creatinine, Ser: 1.32 mg/dL — ABNORMAL HIGH (ref 0.44–1.00)
GFR, Estimated: 47 mL/min — ABNORMAL LOW (ref >60)
Glucose, Bld: 270 mg/dL — ABNORMAL HIGH (ref 70–99)
Potassium: 4.1 mmol/L (ref 3.5–5.1)
Sodium: 138 mmol/L (ref 135–145)

## 2022-09-04 LAB — GLUCOSE, CAPILLARY: Glucose-Capillary: 233 mg/dL — ABNORMAL HIGH (ref 70–99)

## 2022-09-04 MED ORDER — ENSURE PO LIQD
237.0000 mL | Freq: Two times a day (BID) | ORAL | 12 refills | Status: AC
Start: 2022-09-04 — End: ?

## 2022-09-04 NOTE — Telephone Encounter (Signed)
More info was requested by insurance ... The Dr has filled and  faxed back .Marland Kitchen Still awaiting decision

## 2022-09-04 NOTE — Assessment & Plan Note (Addendum)
Hospitalized June 11 to June 15 for abdominal wall abscess, status post I&D with general surgery from [facility] in [facility]. Does not have good follow-up plan in place, likely won't make it to gen surg appointment tomorrow in [place]. Wound covered with tissue/toilet paper today, some purulent drainage. No wound care supplies at home. No signs or symptoms of sepsis or systemic infection today. BM yesterday, no abdominal pain, no fevers. Hopeful she can make it for surgery follow-up. Plan to see her in a week or so to follow-up.

## 2022-09-04 NOTE — Progress Notes (Signed)
Subjective:  Ashley Vaughn is a 57 y.o. who presents to clinic for the following:  Hospitalized June 11 to June 15 for abdominal wall abscess. Was also significantly anemic at recent follow-up with cardiologist. Continued family strife per her report. Limited transport to and from clinic visits. Food insecurity. Obtained permission to talk with daughter, with whom this patient lives, who tries to care for her at home but has several children under her care as well. Daughter wishes she could go to a rehabilitation facility for a while to get medications and health in order. She has follow-up with general surgery tomorrow in [place] but will likely not make that appointment because of lack of transportation.  Review of Systems  Constitutional:  Positive for chills and malaise/fatigue. Negative for fever.  Respiratory:  Negative for shortness of breath.   Gastrointestinal:  Positive for abdominal pain. Negative for blood in stool and constipation.   Objective:   Vitals:   09/04/22 1014  BP: 108/61  Pulse: 88  Temp: 99.4 F (37.4 C)  TempSrc: Oral  SpO2: 100%  Weight: 119 lb 12.8 oz (54.3 kg)  Height: 5\' 2"  (1.575 m)    Physical Exam No distress, appears dejected Heart rate normal, rhythm regular, strong radial pulses, no lower extremity edema Breathing regular and unlabored, no wheezing or crackles Wound on right lateral abdomen is clean with healthy appearing granulation tissue, some cloudy drainage onto makeshift bandage Depressed, affect is dejected  Assessment & Plan:   Problem List Items Addressed This Visit     Healthcare maintenance    Medication reconciliation completed: no Medications present with patient: no Barriers to med rec: complex and disadvantageous psychosocial situation Patient reminded to bring medications to all doctor visits.      HFrEF (heart failure with reduced ejection fraction) (HCC) (Chronic)    Euvolemic, normal blood pressure and heart  rate. Follows with cardiology, Entresto 50 started recently. Also on carvedilol 3.125, empagliflozin-metformin 12.5-500 BID. No updates to management.      Anemia    Hemoglobin 7.4 at recent cardiology appointment. Symptoms include fatigue, dizziness on standing. Suspect source is from draining surgical wound. STAT CBC today.  Update @ 5:39 PM CBC with improving hemoglobin, 7.4 => 8.7. No ED referral or admission. Return in one week to reassess for signs of bleeding, iron studies, repeat CBC if indicated.      Relevant Orders   CBC no Diff (Completed)   BMP w Anion Gap (STAT/Sunquest-performed on-site) (Completed)   Iron, TIBC and Ferritin Panel   Abdominal wall abscess    Hospitalized June 11 to June 15 for abdominal wall abscess, status post I&D with general surgery from [facility] in [facility]. Does not have good follow-up plan in place, likely won't make it to gen surg appointment tomorrow in [place]. Wound covered with tissue/toilet paper today, some purulent drainage. No wound care supplies at home. No signs or symptoms of sepsis or systemic infection today. BM yesterday, no abdominal pain, no fevers. Hopeful she can make it for surgery follow-up. Plan to see her in a week or so to follow-up.      Relevant Orders   BMP w Anion Gap (STAT/Sunquest-performed on-site) (Completed)   Other Visit Diagnoses     Type 2 diabetes mellitus with diabetic neuropathy, with long-term current use of insulin (HCC)    -  Primary   Relevant Orders   POC Hbg A1C (Completed)   BMP w Anion Gap (STAT/Sunquest-performed on-site) (Completed)  Return in 1-2 weeks for follow-up of abdominal wound and diabetes.  Patient seen with Dr. Carmela Hurt MD 09/04/2022, 5:50 PM  [phone]

## 2022-09-04 NOTE — Patient Instructions (Signed)
Remember to bring all of the medications that you take (including over the counter medications and supplements) with you to every clinic visit.  This after visit summary is an important review of tests, referrals, and medication changes that were discussed during your visit. If you have questions or concerns, call [phone]. Outside of clinic business hours, call the main hospital at [phone] and ask the operator for the on-call internal medicine resident.   Marrianne Mood MD 09/04/2022, 12:08 PM

## 2022-09-04 NOTE — Assessment & Plan Note (Signed)
Medication reconciliation completed: no Medications present with patient: no Barriers to med rec: complex and disadvantageous psychosocial situation Patient reminded to bring medications to all doctor visits.

## 2022-09-04 NOTE — Assessment & Plan Note (Signed)
Euvolemic, normal blood pressure and heart rate. Follows with cardiology, Entresto 50 started recently. Also on carvedilol 3.125, empagliflozin-metformin 12.5-500 BID. No updates to management.

## 2022-09-04 NOTE — Assessment & Plan Note (Addendum)
Hemoglobin 7.4 at recent cardiology appointment. Symptoms include fatigue, dizziness on standing. Suspect source is from draining surgical wound. STAT CBC today.  Update @ 5:39 PM CBC with improving hemoglobin, 7.4 => 8.7. No ED referral or admission. Return in one week to reassess for signs of bleeding, iron studies, repeat CBC if indicated.

## 2022-09-05 ENCOUNTER — Telehealth (HOSPITAL_COMMUNITY): Payer: Self-pay | Admitting: Licensed Clinical Social Worker

## 2022-09-05 NOTE — Telephone Encounter (Signed)
Decision:Denied The request does not meet the definition of medical necessity found in the members booklet. This medication is not on the formulary. The member must try and fail or be unable to take all formulary alternatives (due to interactions,side effects,etc.)

## 2022-09-06 NOTE — Telephone Encounter (Signed)
H&V Care Navigation CSW Progress Note  Clinical Social Worker confirmed with pt she was able to make it to [place] for her post surgery follow up.  Pt still with transportation issues so CSW continued to call [employer] workers and found that pt has been assigned and they will follow up with her by phone for enrollment and assistance.   SDOH Screenings   Food Insecurity: Low Risk  (09/05/2022)   Received from [facility]  Recent Concern: Food Insecurity - Medium Risk (07/30/2022)   [facility]  Housing: High Risk (06/12/2022)  Transportation Needs: No Transportation Needs (09/05/2022)   Received from [facility]  Recent Concern: Transportation Needs - Unmet Transportation Needs (09/02/2022)  Utilities: Low Risk  (09/05/2022)   Received from [facility]  Alcohol Screen: Low Risk  (01/02/2022)  Depression (PHQ2-9): High Risk (06/12/2022)  Financial Resource Strain: High Risk (11/01/2021)  Social Connections: Socially Isolated (01/02/2022)  Tobacco Use: Medium Risk (09/02/2022)    Ashley Sis, LCSW Clinical Social Worker [facility] Desk#: [phone] Cell#: [phone]

## 2022-09-09 ENCOUNTER — Other Ambulatory Visit: Payer: Self-pay | Admitting: Internal Medicine

## 2022-09-09 DIAGNOSIS — E1140 Type 2 diabetes mellitus with diabetic neuropathy, unspecified: Secondary | ICD-10-CM

## 2022-09-09 DIAGNOSIS — I5020 Unspecified systolic (congestive) heart failure: Secondary | ICD-10-CM

## 2022-09-10 ENCOUNTER — Encounter: Payer: Self-pay | Admitting: Student

## 2022-09-10 ENCOUNTER — Ambulatory Visit: Payer: BLUE CROSS/BLUE SHIELD | Admitting: Infectious Diseases

## 2022-09-10 ENCOUNTER — Ambulatory Visit (INDEPENDENT_AMBULATORY_CARE_PROVIDER_SITE_OTHER): Payer: BLUE CROSS/BLUE SHIELD | Admitting: Infectious Diseases

## 2022-09-10 ENCOUNTER — Other Ambulatory Visit: Payer: Self-pay

## 2022-09-10 ENCOUNTER — Encounter: Payer: Self-pay | Admitting: Infectious Diseases

## 2022-09-10 VITALS — BP 122/78 | HR 88 | Temp 97.3°F | Ht 62.0 in | Wt 118.0 lb

## 2022-09-10 DIAGNOSIS — Z7984 Long term (current) use of oral hypoglycemic drugs: Secondary | ICD-10-CM

## 2022-09-10 DIAGNOSIS — L02211 Cutaneous abscess of abdominal wall: Secondary | ICD-10-CM | POA: Diagnosis not present

## 2022-09-10 DIAGNOSIS — B20 Human immunodeficiency virus [HIV] disease: Secondary | ICD-10-CM | POA: Diagnosis not present

## 2022-09-10 DIAGNOSIS — E1140 Type 2 diabetes mellitus with diabetic neuropathy, unspecified: Secondary | ICD-10-CM

## 2022-09-10 DIAGNOSIS — D649 Anemia, unspecified: Secondary | ICD-10-CM

## 2022-09-10 DIAGNOSIS — R634 Abnormal weight loss: Secondary | ICD-10-CM

## 2022-09-10 DIAGNOSIS — Z8673 Personal history of transient ischemic attack (TIA), and cerebral infarction without residual deficits: Secondary | ICD-10-CM | POA: Insufficient documentation

## 2022-09-10 DIAGNOSIS — Z7985 Long-term (current) use of injectable non-insulin antidiabetic drugs: Secondary | ICD-10-CM

## 2022-09-10 DIAGNOSIS — L89152 Pressure ulcer of sacral region, stage 2: Secondary | ICD-10-CM | POA: Insufficient documentation

## 2022-09-10 DIAGNOSIS — D508 Other iron deficiency anemias: Secondary | ICD-10-CM

## 2022-09-10 LAB — CBC
MCHC: 29.3 g/dL — ABNORMAL LOW (ref 32.0–36.0)
MCV: 69.2 fL — ABNORMAL LOW (ref 80.0–100.0)
Platelets: 597 10*3/uL — ABNORMAL HIGH (ref 140–400)
RBC: 4.19 10*6/uL (ref 3.80–5.10)
RDW: 15.5 % — ABNORMAL HIGH (ref 11.0–15.0)
WBC: 6.4 10*3/uL (ref 3.8–10.8)

## 2022-09-10 MED ORDER — MEDIHONEY WOUND/BURN DRESSING EX PSTE: 1.0000 | PASTE | Freq: Every day | CUTANEOUS | 0 refills | Status: AC

## 2022-09-10 MED ORDER — DULOXETINE HCL 60 MG PO CPEP: 60.0000 mg | ORAL_CAPSULE | Freq: Every day | ORAL | 0 refills | Status: DC

## 2022-09-10 MED ORDER — ACCU-CHEK GUIDE W/DEVICE KIT
PACK | 0 refills | Status: DC
Start: 2022-09-10 — End: 2023-01-23

## 2022-09-10 NOTE — Assessment & Plan Note (Signed)
Wound inspected and appears to be healing in. Granulation tissue throughout moist wound bed. No drainage. No periwound concerns for any secondary infection. Continue with wound care per Gen Surgery recs. She is doing a good job trying to maintain this. I gave her more dressing supplies today.

## 2022-09-10 NOTE — Patient Instructions (Addendum)
For your wound on the backside -  Try to keep off the wound with direct sitting. It is a wound that is a little too wet.  Get up and move around every 2 hours or so during the day or at least reposition / shift your weight.  I am going to try to do a cream called medi-honey to apply once a day for you.  You can use the foam pads once a week as long as it is not soiled with a daily change of the gauze under it that I gave you. Fold in half and put between. If needed you can use water to remove it in the shower if it sticks too much.   Refills of your Cymbalta have been sent in - Dr. Benito Mccreedy can help with further refills.   Dr. Benito Mccreedy sent in your nutrition supplements to the [facility] - please call the pharmacy or check in with the internal medicine clinic to follow up.    Would like to see you again in 4 months

## 2022-09-10 NOTE — Assessment & Plan Note (Signed)
Has been waivering between 115 - 120 since November. I gave her a bag of food today - we don't have much in the pantry unfortunately. Food access has been largely her problem especially with the amount of people in her home outweighing the resources available to her. I referred her to her pharmacy to see if they have received her supplement orders that were put in from PCP team last week.   [facility] continues to try to supplement as they can.

## 2022-09-10 NOTE — Assessment & Plan Note (Signed)
Partial thickness skin loss with surrounding macerated tissue in gluteal cleft. No acute findings of infection today.  Will try to get her medihoney to apply to the wound to debride out some of the slough. Alginate dressing applied today with allevyn foam over the wound - I gave her more to take home. Change daily. Can use water or remove dressing in shower if adheres. Offloading recommendations discussed as well.

## 2022-09-10 NOTE — Assessment & Plan Note (Signed)
Reviewed plan from PCP team - will check Iron Studies for them given transportation troubles  Trend was improved last check last week with Hgb

## 2022-09-10 NOTE — Assessment & Plan Note (Signed)
Virologically has been well controlled on Biktarvy. She has refills available to her. She would like to return in 13m to check in.  Will need to update cervical cancer screening at some point - other medical needs have been priority.  No medication interactions with ARVs.  Pertinent labs ordered today.  She is on statin for primary prevention of HIV associated CVD.

## 2022-09-10 NOTE — Progress Notes (Signed)
Name: Ashley Vaughn  DOB: Mar 15, 1965 MRN: [id number] PCP: Ashley Mood, MD     Subjective:   Chief Complaint  Patient presents with   Follow-up      HPI: Ashley Vaughn is here for routine HIV follow up care.  Still working on housing with [facility]; but living with her daughter still. States she is having trouble getting her nutrition supplements as prescribed by her PCP.   She likes the [place] and has no trouble taking it. Usually takes it in the morning when she takes other medicines. Takes about 10 pills in the morning. She gets some dizziness that is helped with taking with food.   Hospitalized 24m ago from June 11 - 15th for abdominal wall abscess and anemia.  She is following with General Surgery team in [facility] but has had a lot of trouble with follow up due to transportation challenges.  7/18 saw Gen Surgery - has open wound on Rt flank that is being packed with Nu gauze. PCP team helped her with wound care supplies as she was not able to maintain this well. Instructed to do this twice a day. Gets done maybe once a day with her shower routine.   She needs refill on cymbalta for neuropathy - has not had this in a while per her description. New wound on bottom between cheeks that is painful and sore. Has bene using [place] OTC to it with some mild help. She sits a lot and sleeps on a couch at her daughters home. Needs more food. Has about 6 adults and children living at home and food is not easily come by. Does have food stamps.       09/10/2022    2:49 PM  Depression screen PHQ 2/9  Decreased Interest 0  Down, Depressed, Hopeless 1  PHQ - 2 Score 1    Review of Systems  Constitutional:  Positive for malaise/fatigue and weight loss. Negative for chills and fever.  Respiratory: Negative.    Cardiovascular:  Negative for leg swelling.  Gastrointestinal:  Negative for abdominal pain, diarrhea, nausea and vomiting.  Musculoskeletal:  Negative for back pain.  Neurological:   Positive for tingling and weakness.    Past Medical History:  Diagnosis Date   AKI (acute kidney injury) (HCC) 08/24/2021   CVA (cerebral vascular accident) (HCC) 07/20/2021   Depression    Diabetes mellitus    Fistula    Heart failure (HCC)    HIV infection (HCC)    HSV (herpes simplex virus) anogenital infection 03/12/2019   Postoperative infection 07/18/2021   Patient had appendectomy early in May, followed by a hospitalization and drain placement for an intrabdominal infection and abscess.   Postprocedural intraabdominal abscess 07/20/2021   Intra-abdominal abscess with fistulous communication between abscess cavity and cecum.   S/P laparoscopic appendectomy 06/22/2021   Seizure (HCC) 07/18/2021    Outpatient Medications Prior to Visit  Medication Sig Dispense Refill   bictegravir-emtricitabine-tenofovir AF (BIKTARVY) 50-200-25 MG TABS tablet Take 1 tablet by mouth daily. 30 tablet 11   carvedilol (COREG) 3.125 MG tablet Take 1 tablet (3.125 mg total) by mouth 2 (two) times daily with a meal. 60 tablet 3   Empagliflozin-metFORMIN HCl (SYNJARDY) 12.5-500 MG TABS Take 1 tablet by mouth 2 (two) times daily. 60 tablet 3   glucose blood (ACCU-CHEK GUIDE) test strip Use as instructed 100 each 12   rosuvastatin (CRESTOR) 10 MG tablet TAKE ONE TABLET BY MOUTH DAILY 30 tablet 10   sacubitril-valsartan (  ENTRESTO) 24-26 MG Take 1 tablet by mouth 2 (two) times daily.     SYNJARDY 5-500 MG TABS TAKE ONE TABLET BY MOUTH TWICE A DAY 60 tablet 10   Blood Glucose Monitoring Suppl (ACCU-CHEK GUIDE) w/Device KIT To monitor blood sugar, use in the morning before first meal of the day. 1 kit 3   DULoxetine HCl 40 MG CPEP Take 1 capsule (40 mg total) by mouth daily with breakfast. 60 capsule 11   Accu-Chek Softclix Lancets lancets Check blood sugar 3 times per day 270 each 3   Ensure (ENSURE) Take 237 mLs by mouth 2 (two) times daily between meals. 237 mL 12   Semaglutide,0.25 or 0.5MG /DOS, 2 MG/3ML  SOPN Inject 0.25 mg into the skin once a week. (Patient not taking: Reported on 09/10/2022) 3 mL 3   No facility-administered medications prior to visit.     Allergies  Allergen Reactions   Metformin And Related     Unknown reaction    Fluorescein Itching and Rash    INTRAVENOUSLY    Social History   Tobacco Use   Smoking status: Never    Passive exposure: Current   Smokeless tobacco: Never  Vaping Use   Vaping status: Never Used  Substance Use Topics   Alcohol use: Not Currently    Comment: occasional   Drug use: No    Comment: h/o cocaine abuse, clean since 2009    Family History  Problem Relation Age of Onset   Diabetes Mother    Hypertension Mother    Heart disease Father    Arthritis Father    Diabetes Maternal Uncle     Social History   Substance and Sexual Activity  Sexual Activity Never   Partners: Male   Comment: pt. declined condoms     Objective:   Vitals:   09/10/22 1447  BP: 122/78  Pulse: 88  Temp: (!) 97.3 F (36.3 C)  TempSrc: Oral  SpO2: 99%  Weight: 118 lb (53.5 kg)  Height: 5\' 2"  (1.575 m)   Body mass index is 21.58 kg/m.  Physical Exam Vitals reviewed.  Constitutional:      Appearance: Normal appearance. She is not ill-appearing.  Cardiovascular:     Rate and Rhythm: Normal rate.  Pulmonary:     Effort: Pulmonary effort is normal.     Breath sounds: Normal breath sounds.  Abdominal:     General: Bowel sounds are normal.     Palpations: Abdomen is soft.     Comments: Abdominal drain in place - redressed today.  Neurological:     Mental Status: She is alert.         Lab Results Lab Results  Component Value Date   WBC 14.0 (H) 09/04/2022   HGB 8.7 (L) 09/04/2022   HCT 29.4 (L) 09/04/2022   MCV 68.9 (L) 09/04/2022   PLT 590 (H) 09/04/2022    Lab Results  Component Value Date   CREATININE 1.32 (H) 09/04/2022   BUN 12 09/04/2022   NA 138 09/04/2022   K 4.1 09/04/2022   CL 100 09/04/2022   CO2 23  09/04/2022    Lab Results  Component Value Date   ALT 25 07/29/2022   AST 25 07/29/2022   ALKPHOS 113 07/29/2022   BILITOT 0.3 07/29/2022    Lab Results  Component Value Date   CHOL 148 07/19/2021   HDL 34 (L) 07/19/2021   LDLCALC 90 07/19/2021   TRIG 118 07/19/2021   CHOLHDL 4.4 07/19/2021  HIV 1 RNA Quant  Date Value  01/31/2022 Not Detected Copies/mL  08/28/2021 NOT DETECTED copies/mL  05/04/2021 Not Detected Copies/mL   CD4 T Cell Abs (/uL)  Date Value  08/28/2021 1,076  09/06/2019 1,491  03/12/2019 1,208     Assessment & Plan:   Problem List Items Addressed This Visit       Unprioritized   Abdominal wall abscess    Wound inspected and appears to be healing in. Granulation tissue throughout moist wound bed. No drainage. No periwound concerns for any secondary infection. Continue with wound care per Gen Surgery recs. She is doing a good job trying to maintain this. I gave her more dressing supplies today.       Anemia    Reviewed plan from PCP team - will check Iron Studies for them given transportation troubles  Trend was improved last check last week with Hgb      Relevant Orders   CBC   Iron, TIBC and Ferritin Panel   Human immunodeficiency virus (HIV) disease (HCC) - Primary    Virologically has been well controlled on Biktarvy. She has refills available to her. She would like to return in 16m to check in.  Will need to update cervical cancer screening at some point - other medical needs have been priority.  No medication interactions with ARVs.  Pertinent labs ordered today.  She is on statin for primary prevention of HIV associated CVD.       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 No acute findings of infection today.  Will try to get her medihoney to apply to the wound to debride out some of the slough. Alginate dressing applied today with allevyn foam over the wound - I gave her more to take home. Change daily. Can use water or remove dressing in shower if adheres. Offloading recommendations discussed as well.       Weight loss    Has been waivering between 115 - 120 since November. I gave her a bag of food today - we don't have much in the pantry unfortunately. Food access has been largely her problem especially with the amount of people in her home outweighing the resources available to her. I referred her to her pharmacy to see if they have received her supplement orders that were put in from PCP team last week.   [facility] continues to try to supplement as they can.         Rexene Alberts, MSN, NP-C [facility] Pager: [phone] Office: [phone]  09/10/22  3:51 PM

## 2022-09-11 ENCOUNTER — Telehealth: Payer: Self-pay

## 2022-09-11 LAB — IRON,TIBC AND FERRITIN PANEL
%SAT: 21 % (calc) (ref 16–45)
Iron: 62 ug/dL (ref 45–160)
TIBC: 301 mcg/dL (calc) (ref 250–450)

## 2022-09-11 LAB — T-HELPER CELLS (CD4) COUNT (NOT AT ARMC)
CD4 % Helper T Cell: 38 % (ref 33–65)
CD4 T Cell Abs: 1201 /uL (ref 400–1790)

## 2022-09-11 NOTE — Telephone Encounter (Signed)
Patient called office requesting to review results from earlier this week. Informed patient that we are still waiting on some lab results. Will call once results are back and have been review by provider. Verbalized understanding.  [name], RMA

## 2022-09-11 NOTE — Progress Notes (Signed)
Internal Medicine Clinic Attending  I was physically present during the key portions of the resident provided service and participated in the medical decision making of patient's management care. I reviewed pertinent patient test results.  The assessment, diagnosis, and plan were formulated together and I agree with the documentation in the resident's note.  Williams, Julie Anne, MD  

## 2022-09-12 LAB — CBC
HCT: 29 % — ABNORMAL LOW (ref 35.0–45.0)
Hemoglobin: 8.5 g/dL — ABNORMAL LOW (ref 11.7–15.5)
MCH: 20.3 pg — ABNORMAL LOW (ref 27.0–33.0)
MPV: 9.2 fL (ref 7.5–12.5)

## 2022-09-12 LAB — IRON,TIBC AND FERRITIN PANEL: Ferritin: 265 ng/mL — ABNORMAL HIGH (ref 16–232)

## 2022-09-12 LAB — HIV-1 RNA QUANT-NO REFLEX-BLD
HIV 1 RNA Quant: 86 Copies/mL — ABNORMAL HIGH
HIV-1 RNA Quant, Log: 1.93 Log cps/mL — ABNORMAL HIGH

## 2022-09-19 ENCOUNTER — Telehealth: Payer: Self-pay

## 2022-09-19 NOTE — Telephone Encounter (Signed)
Called patient to discuss results, call could not be completed.   Sandie Ano, RN

## 2022-09-19 NOTE — Telephone Encounter (Signed)
-----   Message from Rexene Alberts sent at 09/19/2022  9:42 AM EDT ----- Please call Ashley Vaughn to let her know that her viral load is a little bit elevated at 86 copies.  This is still considered in the range of undetectable and may just be a blip.  We can see that happen from time to time without concern.  We will repeat this at her next office visit Her white blood count did return back to normal, which is a good thing to see. No new recommendations and I hope her wounds are healing up well

## 2022-09-20 NOTE — Telephone Encounter (Signed)
Patient aware and voiced her understanding. Patient stated wounds are trying to heal.

## 2022-09-23 ENCOUNTER — Other Ambulatory Visit: Payer: Self-pay | Admitting: Internal Medicine

## 2022-09-23 NOTE — Telephone Encounter (Signed)
Plavix is not on current med list.

## 2022-09-24 ENCOUNTER — Other Ambulatory Visit: Payer: Self-pay | Admitting: Student

## 2022-09-24 NOTE — Telephone Encounter (Signed)
  glucose blood (ACCU-CHEK GUIDE) test strip    [facility] [place], [place] - [address] [place] (Ph: [phone])

## 2022-09-25 ENCOUNTER — Other Ambulatory Visit: Payer: Self-pay

## 2022-09-25 DIAGNOSIS — E1140 Type 2 diabetes mellitus with diabetic neuropathy, unspecified: Secondary | ICD-10-CM

## 2022-09-25 NOTE — Telephone Encounter (Signed)
Patient called she is requesting a rx refill for test strips and freestyle libre sensor to be sent to [facility].

## 2022-09-26 MED ORDER — ACCU-CHEK GUIDE VI STRP
ORAL_STRIP | 12 refills | Status: DC
Start: 2022-09-26 — End: 2023-09-12

## 2022-09-26 MED ORDER — FREESTYLE LIBRE 3 SENSOR MISC: 3 refills | Status: DC

## 2022-10-12 LAB — AMB RESULTS CONSOLE CBG: Glucose: 459

## 2022-10-16 ENCOUNTER — Ambulatory Visit (HOSPITAL_COMMUNITY): Admission: RE | Admit: 2022-10-16 | Payer: BLUE CROSS/BLUE SHIELD | Source: Ambulatory Visit

## 2022-10-16 ENCOUNTER — Encounter (HOSPITAL_COMMUNITY): Payer: BLUE CROSS/BLUE SHIELD | Admitting: Cardiology

## 2022-10-22 ENCOUNTER — Encounter: Payer: Self-pay | Admitting: *Deleted

## 2022-10-22 ENCOUNTER — Other Ambulatory Visit: Payer: Self-pay | Admitting: Student

## 2022-10-22 NOTE — Progress Notes (Signed)
Pt attended 10/12/22 screening event where her b/p was 140/83 and her blood sugar was 459. At the event she stated she received her primary care at [facility] and identified food, housing, transportation, and safety insecurities. During phone call, pt says she has not kept some of her appt because there is some issue with her insurance paying for her visits and her medicines; she has called her Medicaid case manager who pt states "said she is working on it." Pt also states food continues to be an issue although she currently has a place to stay with her daughter. Event results and pt's current status r/t medication access sent to PCP and ID clinic contact Rexene Alberts, NP via EPIC in-basket to enlist whatever additional support their offices can arrange. Pt's info also sent to the [facility] pharmacy pool in case their team could also support med and wound care supply access and possibly communication support with pt's Medicaid care manager. Health Equity team will continue also continue scheduled follow up.

## 2022-10-24 ENCOUNTER — Telehealth: Payer: Self-pay

## 2022-10-24 NOTE — Telephone Encounter (Signed)
-----   Message from [facility] Tiffany S sent at 10/24/2022  9:50 AM EDT ----- Regarding: FW: [place] Equity screening event results  ----- Message ----- From: Blanchard Kelch, NP Sent: 10/22/2022   1:32 PM EDT To: Neomia Dear, RN; Marcell Anger, CMA; # Subject: RE: [place] Equity screening event resul#  Thanks for the update -   Part of the problem is her large family she lives with eats the little food she gets from her [facility] case management team. At least that's what Lashon shares with me frequently.  I will reach out to her case manager with [facility] but Ayveri should have that information - we don't interface with them electronically as they are a non-[facility] entity. Will see if we can find out but last I spoke with Raylee she mentioned this was a big problem.    @Tiffany  - can we see if Johaura is around to help [place] figure out who Shila's case worker is at [facility] please?   Thank you  Judeth Cornfield ----- Message ----- From: Neomia Dear, RN Sent: 10/22/2022   1:18 PM EDT To: Blanchard Kelch, NP; Rich Reining; # Subject: [place] Equity screening event results     Hello,  This patient attended an 10/12/22 [facility] Equity screening event where her b/p (on retake) was 140/83 and her random blood sugar was 459. When we called pt to try to assist with follow through, she said she was unable to purchase her medicines because her Medicaid was somehow not covering them (pt was unsure about her BCBS and her [facility].) Pt stated she had called her Medicaid case manager who was "working on it". She also said she continues to have issues with enough food. Saw that [facility] was at one time involved, but was not sure how to share this ongoing need directly with them. We wanted to share this most recent update with you as her PCP and ID providers in the hopes you might be able to share with any SW support resources you have. We have advised pt to call her [employer] case manager back to remind her she is  continuing to go without meds and food. All this info is documented in [facility] for your review. Thanks, Dorie Rank, RN, [facility] Health Equity team

## 2022-10-24 NOTE — Telephone Encounter (Signed)
Received the following response from TG:  "Hi, Ashley Vaughn--thanks for reaching out with this update. I have been working with Ashley Vaughn for a little over a year now. We have discussed multiple interventions relating to food insecurity, including:  1. Various methods to utilize her SNAP benefits via grocery delivery due to limited access to transportation.  2. Providing 4 bus passes per month to be used for meeting ID appointments and accessing resources that will assist with barriers such as food insecurity. We have recently been given funding for a monthly bus pass with the only requirement that the client attain the pass initially. Ashley Vaughn has not pursued this option at this time. 3. Providing access to our day center, [facility], where meals are served daily.   4. Discussed storing her food in a space that is only accessible to her when possible. 5. Contacting/utilizing other community resources like food banks and churches to supplement her food needs.  6. Connected her with the [employer] app with various options for resources.  [facility] is able to help Ashley Vaughn with 3 food bags, biweekly; which I last delivered to her on 8/30. Unfortunately, our services are not as expansive as we would like and are intended to be a supplementary not primary source of our client's food supply.   I will continue to discuss these interventions with her as a part of her care plan. If you could advise her providers of these interventions and ask them to communicate the benefits/access to community food resources with her, that would be beneficial. If the clinic team of staff and providers are not familiar with the [employer] app/website, it is an Tax adviser. There are also additional resources within this app/site that include: healthcare, mental health, housing, senior services, and emergency services resources. Here is the link https://[email or web address].co.za"   Sandie Ano, RN

## 2022-10-24 NOTE — Telephone Encounter (Signed)
Message forwarded to [employer], Case Manager with [facility] and chart updated with CM contact info.   Sandie Ano, RN

## 2022-10-25 ENCOUNTER — Other Ambulatory Visit (HOSPITAL_COMMUNITY): Payer: Self-pay

## 2022-10-25 ENCOUNTER — Telehealth: Payer: Self-pay | Admitting: Pharmacist

## 2022-10-25 MED ORDER — ENTRESTO 24-26 MG PO TABS: 1.0000 | ORAL_TABLET | Freq: Two times a day (BID) | ORAL | 1 refills | Status: DC

## 2022-10-25 NOTE — Progress Notes (Signed)
Attempted to contact patient in response to case management forwarding to pharmacy team for medication management. Main number on file for patient is not in service, 2 attempts. Attempted alternate contact, daughter, no answer - voicemail is full, unable to leave HIPAA compliant message for patient to return my call at their convenience.   Will send mychart.  Lynnda Shields, PharmD, BCPS Clinical Pharmacist [facility]

## 2022-10-28 ENCOUNTER — Other Ambulatory Visit: Payer: BLUE CROSS/BLUE SHIELD | Admitting: Pharmacist

## 2022-10-28 ENCOUNTER — Telehealth: Payer: Self-pay | Admitting: Pharmacist

## 2022-10-28 NOTE — Progress Notes (Deleted)
Attempted outreach to patient, phone is turned off.  Attempted outreach to daughter's number as alternate contact - answered but was unavailable at time of my call and requested later call. Did not answer subsequent call.   Left voicemail for patient to return my call at their convenience.    Lynnda Shields, PharmD, BCPS Clinical Pharmacist [facility]

## 2022-10-28 NOTE — Progress Notes (Signed)
Attempted outreach to patient, phone is turned off.  Attempted outreach to daughter's number as alternate contact - answered but was unavailable at time of my call and requested later call. Did not answer subsequent call.   Left voicemail for patient to return my call at their convenience.    Lynnda Shields, PharmD, BCPS Clinical Pharmacist [facility]

## 2022-10-30 ENCOUNTER — Telehealth (HOSPITAL_COMMUNITY): Payer: Self-pay | Admitting: Licensed Clinical Social Worker

## 2022-10-30 NOTE — Addendum Note (Signed)
Addended by: Bufford Spikes on: 10/30/2022 04:30 PM   Modules accepted: Orders

## 2022-10-30 NOTE — Telephone Encounter (Signed)
H&V Care Navigation CSW Progress Note  Clinical Social Worker received call from pt requesting help with insurance concerns.  Reports she was not able to go to an appt with [facility] health due to having BCBS in addition to her Medicaid.  CSW explained that she would need to call BCBS and cancel this plan then call medicaid to inform of this cancellation.  Numbers provided to pt to follow up on this.   SDOH Screenings   Food Insecurity: Low Risk  (09/05/2022)   Received from [facility]  Recent Concern: Food Insecurity - Medium Risk (07/30/2022)   [facility]  Housing: High Risk (06/12/2022)  Transportation Needs:  Recent Concern: Transportation Needs - Unmet Transportation Needs (09/02/2022)  Utilities: Low Risk  (09/05/2022)   Received from [facility]  Alcohol Screen: Low Risk  (01/02/2022)  Depression (PHQ2-9): Low Risk  (09/10/2022)  Recent Concern: Depression (PHQ2-9) - High Risk (06/12/2022)  Financial Resource Strain: High Risk (11/01/2021)  Social Connections: Socially Isolated (01/02/2022)  Tobacco Use: Low Risk  (09/26/2022)   Received from [facility]  Recent Concern: Tobacco Use - Medium Risk (09/10/2022)   Burna Sis, LCSW Clinical Social Worker [facility] Desk#: [phone] Cell#: [phone]

## 2022-10-31 ENCOUNTER — Other Ambulatory Visit (HOSPITAL_COMMUNITY): Payer: Self-pay | Admitting: Cardiology

## 2022-10-31 ENCOUNTER — Other Ambulatory Visit: Payer: Self-pay | Admitting: Internal Medicine

## 2022-11-04 ENCOUNTER — Other Ambulatory Visit: Payer: Self-pay

## 2022-11-04 DIAGNOSIS — E1140 Type 2 diabetes mellitus with diabetic neuropathy, unspecified: Secondary | ICD-10-CM

## 2022-11-04 MED ORDER — FREESTYLE LIBRE 3 SENSOR MISC
3 refills | Status: DC
Start: 2022-11-04 — End: 2024-01-05

## 2022-11-05 ENCOUNTER — Other Ambulatory Visit: Payer: Self-pay | Admitting: Infectious Diseases

## 2022-11-05 DIAGNOSIS — B20 Human immunodeficiency virus [HIV] disease: Secondary | ICD-10-CM

## 2022-11-06 ENCOUNTER — Encounter: Payer: BLUE CROSS/BLUE SHIELD | Admitting: Internal Medicine

## 2022-11-20 ENCOUNTER — Other Ambulatory Visit: Payer: Self-pay | Admitting: Infectious Diseases

## 2022-11-20 DIAGNOSIS — B20 Human immunodeficiency virus [HIV] disease: Secondary | ICD-10-CM

## 2022-11-21 ENCOUNTER — Telehealth: Payer: Self-pay

## 2022-11-21 NOTE — Telephone Encounter (Signed)
Decision:Approved Ashley Vaughn (Key: [id number]) PA Case ID #: [id number] Rx #: [id number] Need Help? Call us at [phone] Outcome Approved today by [employer] Medicaid 2017 Approved. SYNJARDY 5/500/MG Tablet is approved from 11/21/2022 to 11/21/2023. All strengths of the drug are approved. Authorization Expiration Date: 11/21/2023 Drug Synjardy 5-500MG  tablets ePA cloud logo Form [employer] Medicaid Electronic Prior Authorization Form Original Claim Info 75

## 2022-11-21 NOTE — Telephone Encounter (Signed)
Prior Authorization for patient Ashley Vaughn) came through on cover my meds was submitted with last office notes and labs awaiting approval or denial.  [id number]

## 2022-11-29 ENCOUNTER — Telehealth: Payer: Self-pay | Admitting: Internal Medicine

## 2022-11-29 NOTE — Telephone Encounter (Signed)
This patient has screened positive for food insecurity in the past.   On 11/06/22, patient was called to re-assess food insecurity needs.   Answers were updated in the SDOH wheel.  Ashley Coder, MD

## 2022-12-04 ENCOUNTER — Other Ambulatory Visit: Payer: Self-pay | Admitting: Infectious Diseases

## 2022-12-04 DIAGNOSIS — B20 Human immunodeficiency virus [HIV] disease: Secondary | ICD-10-CM

## 2022-12-09 ENCOUNTER — Encounter: Payer: BLUE CROSS/BLUE SHIELD | Admitting: Student

## 2022-12-17 ENCOUNTER — Telehealth: Payer: Self-pay | Admitting: *Deleted

## 2022-12-17 ENCOUNTER — Encounter: Payer: Self-pay | Admitting: *Deleted

## 2022-12-17 NOTE — Telephone Encounter (Signed)
Uanble to leave voice message / recording says call back later/ this call was in regards to her mammogram appointment for 01-10-2023  arrive 12:40 pm.

## 2022-12-17 NOTE — Progress Notes (Signed)
Pt attended 10/12/22 screening event where her b/p was 140/83 and her blood sugar was 459. At the event, the pt listed food, housing and transportation insecurities for which she was given resources at the event. During the initial f/u, pt was confirmed as having a PCP at the [facility] whom she last saw on 09/04/22, where PCP addressed diabetes and other ongoing chronic disease management issues.  Pt also has SW support through the Infectious Disease Center and [facility], especially with food and transportation. Chart review also indicates pt has ongoing cardiology SW support for her SDOH and cardiology f/u.At the initial event f/u, the pt indicated she was currently living with her daughter. During this 60 day f/u, health equity team member was unable to contact pt by phone (no option for VM). Letter sent to pt at her last known address including [employer] information for care management, guidance r/t insurance coverage and reminders for her upcoming appt with Infectious Disease and the need to r/s a missed appt on 12/09/22 with her PCP. No additional health equity support indicated at this time since pt has PCP and SW support from 2 of her specialists and [employer] case management.

## 2022-12-22 NOTE — Progress Notes (Signed)
SDOH resources for food, housing and utilities given to pt

## 2022-12-31 ENCOUNTER — Ambulatory Visit: Payer: BLUE CROSS/BLUE SHIELD | Admitting: Infectious Diseases

## 2022-12-31 ENCOUNTER — Telehealth: Payer: Self-pay

## 2022-12-31 ENCOUNTER — Other Ambulatory Visit: Payer: Self-pay | Admitting: Infectious Diseases

## 2022-12-31 DIAGNOSIS — B20 Human immunodeficiency virus [HIV] disease: Secondary | ICD-10-CM

## 2022-12-31 NOTE — Telephone Encounter (Signed)
Appt 11/12

## 2022-12-31 NOTE — Telephone Encounter (Signed)
Called patient regarding missed appointment today. Left voicemail requesting call back. Will need to reschedule. [name], RMA

## 2023-01-10 ENCOUNTER — Ambulatory Visit: Payer: BLUE CROSS/BLUE SHIELD

## 2023-01-22 ENCOUNTER — Encounter: Payer: Self-pay | Admitting: *Deleted

## 2023-01-22 ENCOUNTER — Encounter: Payer: BLUE CROSS/BLUE SHIELD | Admitting: Student

## 2023-01-22 NOTE — Progress Notes (Signed)
Pt has attended another screening event on 12/22/22, where her b/p was 91/70 and pt identified food, housing, and utility SDOH insecurities. After previous screening event earlier this year, health equity team member had reached out to pt with [facility] services and had connected with her Infectious Disease clinic PCP about connecting pt to ongoing [facility] food and housing support options. Chart review indicates pt has cancelled multiple PCP and infectious disease appts since her last PCP visit on 09/04/22 with Dr.McLendon at the [facility] and her last Inf Disease clinic visit on 09/10/22. Chart review reveals she has appt tomorrow, 01/23/23 with her PCP office and on 01/30/23 with Rexene Alberts, NP at the Inf Disease center. Pt returned call from health equity team member and stated she intended to go to her PCP office tomorrow since she missed today and that she intends to go to her 12/12 appt with NP Dixon. Pt confirms she did receive her last mailing with the [facility] transportation and care manager information but she had not yet called to find out who her case manager was to help her. She was concerned today because her disability check was not getting direct deposited correctly but pt said she was too tired to talk about the issue now. Health equity team member advised pt to talk to her PCP SW at her visit tomorrow and ask for help. Pt confirmed she was also aware that NP Dixon had access to the [facility] CM and SW and could talk to her next week. Since pt has access to health care at PCP and Inf Disease as well as CM and SW options at the [facility] and Inf Disease and thru her [facility], no additional health equity team support scheduled at this time.

## 2023-01-23 ENCOUNTER — Encounter: Payer: Self-pay | Admitting: Student

## 2023-01-23 ENCOUNTER — Telehealth: Payer: Self-pay

## 2023-01-23 ENCOUNTER — Ambulatory Visit (INDEPENDENT_AMBULATORY_CARE_PROVIDER_SITE_OTHER): Payer: BLUE CROSS/BLUE SHIELD | Admitting: Student

## 2023-01-23 VITALS — BP 129/80 | HR 98 | Ht 62.0 in | Wt 134.0 lb

## 2023-01-23 DIAGNOSIS — E1140 Type 2 diabetes mellitus with diabetic neuropathy, unspecified: Secondary | ICD-10-CM | POA: Diagnosis not present

## 2023-01-23 DIAGNOSIS — I10 Essential (primary) hypertension: Secondary | ICD-10-CM | POA: Diagnosis not present

## 2023-01-23 DIAGNOSIS — G4730 Sleep apnea, unspecified: Secondary | ICD-10-CM

## 2023-01-23 DIAGNOSIS — Z794 Long term (current) use of insulin: Secondary | ICD-10-CM

## 2023-01-23 DIAGNOSIS — M25551 Pain in right hip: Secondary | ICD-10-CM | POA: Diagnosis not present

## 2023-01-23 DIAGNOSIS — R0989 Other specified symptoms and signs involving the circulatory and respiratory systems: Secondary | ICD-10-CM

## 2023-01-23 DIAGNOSIS — Z9189 Other specified personal risk factors, not elsewhere classified: Secondary | ICD-10-CM

## 2023-01-23 DIAGNOSIS — Z0000 Encounter for general adult medical examination without abnormal findings: Secondary | ICD-10-CM

## 2023-01-23 LAB — POCT GLYCOSYLATED HEMOGLOBIN (HGB A1C): Hemoglobin A1C: 13.1 % — AB (ref 4.0–5.6)

## 2023-01-23 LAB — GLUCOSE, CAPILLARY: Glucose-Capillary: 307 mg/dL — ABNORMAL HIGH (ref 70–99)

## 2023-01-23 MED ORDER — PEN NEEDLES 3/16" 31G X 5 MM MISC: 3 refills | Status: AC

## 2023-01-23 MED ORDER — INSULIN GLARGINE-YFGN 100 UNIT/ML ~~LOC~~ SOLN: 6.0000 [IU] | Freq: Every day | SUBCUTANEOUS | 11 refills | Status: DC

## 2023-01-23 MED ORDER — SYNJARDY 12.5-1000 MG PO TABS: 1.0000 | ORAL_TABLET | Freq: Two times a day (BID) | ORAL | 3 refills | Status: DC

## 2023-01-23 MED ORDER — DEXCOM G7 SENSOR MISC: 5 refills | Status: DC

## 2023-01-23 MED ORDER — ACCU-CHEK GUIDE W/DEVICE KIT: PACK | 0 refills | Status: DC

## 2023-01-23 MED ORDER — DEXCOM G7 RECEIVER DEVI: 0 refills | Status: DC

## 2023-01-23 MED ORDER — ACCU-CHEK SOFTCLIX LANCETS MISC: 3 refills | Status: DC

## 2023-01-23 NOTE — Progress Notes (Signed)
CC: Follow up A1c  HPI:  Ashley Vaughn is a 57 y.o. female living with a history stated below and presents today for follow up. Please see problem based assessment and plan for additional details.  Past Medical History:  Diagnosis Date   AKI (acute kidney injury) (HCC) 08/24/2021   CVA (cerebral vascular accident) (HCC) 07/20/2021   Depression    Diabetes mellitus    Fistula    Heart failure (HCC)    HIV infection (HCC)    HSV (herpes simplex virus) anogenital infection 03/12/2019   Postoperative infection 07/18/2021   Patient had appendectomy early in May, followed by a hospitalization and drain placement for an intrabdominal infection and abscess.   Postprocedural intraabdominal abscess (HCC) 07/20/2021   Intra-abdominal abscess with fistulous communication between abscess cavity and cecum.   S/P laparoscopic appendectomy 06/22/2021   Seizure (HCC) 07/18/2021    Current Outpatient Medications on File Prior to Visit  Medication Sig Dispense Refill   BIKTARVY 50-200-25 MG TABS tablet TAKE ONE TABLET BY MOUTH DAILY 30 tablet 5   carvedilol (COREG) 3.125 MG tablet Take 1 tablet (3.125 mg total) by mouth 2 (two) times daily with a meal. 60 tablet 3   Continuous Glucose Sensor (FREESTYLE LIBRE 3 SENSOR) MISC Place 1 sensor on the skin every 14 days. Use to check glucose continuously 2 each 3   DULoxetine (CYMBALTA) 60 MG capsule Take 1 capsule (60 mg total) by mouth daily. 30 capsule 0   Ensure (ENSURE) Take 237 mLs by mouth 2 (two) times daily between meals. 237 mL 12   glucose blood (ACCU-CHEK GUIDE) test strip Use as instructed 100 each 12   leptospermum manuka honey (MEDIHONEY) PSTE paste Apply 1 Application topically daily. Apply once daily to sacral wound with gauze over top 44 mL 0   rosuvastatin (CRESTOR) 10 MG tablet TAKE ONE TABLET BY MOUTH DAILY 30 tablet 10   sacubitril-valsartan (ENTRESTO) 24-26 MG TAKE ONE TABLET BY MOUTH TWICE A DAY 60 tablet 5   Semaglutide,0.25 or  0.5MG /DOS, 2 MG/3ML SOPN Inject 0.25 mg into the skin once a week. (Patient not taking: Reported on 09/10/2022) 3 mL 3   No current facility-administered medications on file prior to visit.    Family History  Problem Relation Age of Onset   Diabetes Mother    Hypertension Mother    Heart disease Father    Arthritis Father    Diabetes Maternal Uncle     Social History   Socioeconomic History   Marital status: Legally Separated    Spouse name: Not on file   Number of children: Not on file   Years of education: Not on file   Highest education level: Not on file  Occupational History   Not on file  Tobacco Use   Smoking status: Never    Passive exposure: Current   Smokeless tobacco: Never  Vaping Use   Vaping status: Never Used  Substance and Sexual Activity   Alcohol use: Not Currently    Comment: occasional   Drug use: No    Comment: h/o cocaine abuse, clean since 2009   Sexual activity: Never    Partners: Male    Comment: pt. declined condoms  Other Topics Concern   Not on file  Social History Narrative   Not on file   Social Determinants of Health   Financial Resource Strain: High Risk (11/01/2021)   Overall Financial Resource Strain (CARDIA)    Difficulty of Paying Living Expenses: Very  hard  Food Insecurity: Food Insecurity Present (12/22/2022)   Hunger Vital Sign    Worried About Running Out of Food in the Last Year: Sometimes true    Ran Out of Food in the Last Year: Sometimes true  Transportation Needs: No Transportation Needs (12/22/2022)   PRAPARE - Administrator, Civil Service (Medical): No    Lack of Transportation (Non-Medical): No  Physical Activity: Not on file  Stress: Not on file  Social Connections: Socially Isolated (01/02/2022)   Social Connection and Isolation Panel [NHANES]    Frequency of Communication with Friends and Family: Once a week    Frequency of Social Gatherings with Friends and Family: Once a week    Attends  Religious Services: Never    Database administrator or Organizations: No    Attends Banker Meetings: Never    Marital Status: Separated  Intimate Partner Violence: Not At Risk (12/22/2022)   Humiliation, Afraid, Rape, and Kick questionnaire    Fear of Current or Ex-Partner: No    Emotionally Abused: No    Physically Abused: No    Sexually Abused: No    Review of Systems: ROS negative except for what is noted on the assessment and plan.  Vitals:   01/23/23 0947 01/23/23 0949  BP: 129/80   Pulse: 98   SpO2: 100%   Weight: 134 lb (60.8 kg)   Height: 5\' 2"  (1.575 m) 5\' 2"  (1.575 m)    Physical Exam: Constitutional: well-appearing female sitting in chair, in no acute distress HENT: normocephalic atraumatic, mucous membranes moist Eyes: conjunctiva non-erythematous Cardiovascular: regular rate and rhythm, no m/r/g Pulmonary/Chest: normal work of breathing on room air, lungs clear to auscultation bilaterally MSK: normal bulk and tone Neurological: alert & oriented x 3, no focal deficit Skin: warm and dry Psych: normal mood and behavior  Assessment & Plan:   Patient seen with Dr. Sol Blazing  Poorly controlled type 2 diabetes mellitus with neuropathy (HCC) Unfortunately A1c is now 13.1, it was 10.1 four months ago.  She reports adherence with her Synjardy.  She admits to eating poorly.  She does have limited income, difficult not to eat processed foods.  In the past she was on Ozempic, does not care for it, skeptical of the medicine and its side effects.  Injections do not bother her, in fact she would prefer insulin over a GLP-1 agonist. - Restart insulin.  Has been on in the past.  Due to food insecurity, there is a risk of hypoglycemia, she is quite aware of this risk.  He says she has canned fruit available for low blood sugar.  Will start low 6 units long-acting glargine per day.  Will attempt to get her a Dexcom sensor.  Will place referral for meeting with our excellent  diabetes educator [name]. - Max out [place], empagliflozin-metformin to 12.06-998 mg twice daily - RTC in 1 month -Continue Cymbalta 60 daily for neuropathy  Hypertension At goal.  129/80.  Diabetes goal 130/80. - Continue Entresto 24-26 twice daily, Coreg 3.125 twice daily.  These are part of her HFrEF GDMT.  Right hip pain Progressive and mild right hip pain.  3 out of 10.  Sleeps on a couch, which exacerbates it.  Exam unremarkable.  Some self treatment with ibuprofen and Tylenol which helps somewhat.  Do not feel that imaging is warranted at this time.  Most likely arthritis.  She would like to see physical therapy. - Physical therapy referral  At  risk for sleep apnea She is reporting nighttime episodes of apnea.  Unclear if she is snoring.  She is not obese.  History of heart disease. - Will place order for sleep study  RTC 1 month diabetes  Katheran James, D.O. [facility] Internal Medicine, PGY-1 Phone: [phone] Date 01/23/2023 Time 1:27 PM

## 2023-01-23 NOTE — Telephone Encounter (Signed)
-----   Message from [place] sent at 01/23/2023 11:49 AM EST ----- A1c is 13 now, def needs plug in with PCP and or endocrine ----- Message ----- From: Leory Plowman, Lab In [place] Sent: 01/23/2023  10:07 AM EST To: Randall Hiss, MD

## 2023-01-23 NOTE — Telephone Encounter (Signed)
Decision:Approved De # Dykes (KeyWynetta Emery) PA Case ID #: [id number] Rx #: [id number] Need Help? Call us at [phone] Outcome Approved today by [employer] Medicaid 2017 Approved. DEXCOM G7 RECEIVER Device is approved from 01/23/2023 to 07/24/2023. All strengths of the drug are approved. Authorization Expiration Date: 07/24/2023 Drug Dexcom G7 Receiver device ePA cloud logo Form [employer] Medicaid Electronic Prior Authorization Form Original Claim Info 75

## 2023-01-23 NOTE — Assessment & Plan Note (Signed)
Progressive and mild right hip pain.  3 out of 10.  Sleeps on a couch, which exacerbates it.  Exam unremarkable.  Some self treatment with ibuprofen and Tylenol which helps somewhat.  Do not feel that imaging is warranted at this time.  Most likely arthritis.  She would like to see physical therapy. - Physical therapy referral

## 2023-01-23 NOTE — Patient Instructions (Signed)
For diabetes  Inject 6 units every morning after checking fasting blood sugar. Increase Synjardy to max dose Return in 1 month  Expect a phone call about sleep study

## 2023-01-23 NOTE — Assessment & Plan Note (Signed)
At goal.  129/80.  Diabetes goal 130/80. - Continue Entresto 24-26 twice daily, Coreg 3.125 twice daily.  These are part of her HFrEF GDMT.

## 2023-01-23 NOTE — Telephone Encounter (Signed)
Prior Authorization for patient Ashley Vaughn G7 Receiver device) came through on cover my meds was submitted with last office notes and labs awaiting approval or denial.  KEY: [id number])

## 2023-01-23 NOTE — Assessment & Plan Note (Addendum)
Unfortunately A1c is now 13.1, it was 10.1 four months ago.  She reports adherence with her Synjardy.  She admits to eating poorly.  She does have limited income, difficult not to eat processed foods.  In the past she was on Ozempic, does not care for it, skeptical of the medicine and its side effects.  Injections do not bother her, in fact she would prefer insulin over a GLP-1 agonist. - Restart insulin.  Has been on in the past.  Due to food insecurity, there is a risk of hypoglycemia, she is quite aware of this risk.  He says she has canned fruit available for low blood sugar.  Will start low 6 units long-acting glargine per day.  Will attempt to get her a Dexcom sensor.  Will place referral for meeting with our excellent diabetes educator [name]. - Max out [place], empagliflozin-metformin to 12.06-998 mg twice daily - RTC in 1 month -Continue Cymbalta 60 daily for neuropathy

## 2023-01-23 NOTE — Assessment & Plan Note (Addendum)
She is reporting nighttime episodes of apnea.  Unclear if she is snoring.  She is not obese.  History of heart disease. - Will place order for sleep study

## 2023-01-23 NOTE — Telephone Encounter (Signed)
Spoke with Ashley Vaughn and discussed elevated A1c. She saw her PCP this morning and her Kirk Ruths has been adjusted. Encouraged her to continue following up with PCP. Patient verbalized understanding and has no further questions.   Sandie Ano, RN

## 2023-01-29 NOTE — Progress Notes (Signed)
 Internal Medicine Clinic Attending  I saw and evaluated the patient.  I personally confirmed the key portions of the history and exam documented by Dr.  Ninfa Meeker  and I reviewed pertinent patient test results.  The assessment, diagnosis, and plan were formulated together and I agree with the documentation in the resident's note.

## 2023-01-30 ENCOUNTER — Encounter: Payer: Self-pay | Admitting: Infectious Diseases

## 2023-01-30 ENCOUNTER — Other Ambulatory Visit (HOSPITAL_COMMUNITY)
Admission: RE | Admit: 2023-01-30 | Discharge: 2023-01-30 | Disposition: A | Discharge status: Home / Self Care | Payer: BLUE CROSS/BLUE SHIELD | Source: Ambulatory Visit | Attending: Infectious Diseases | Admitting: Infectious Diseases

## 2023-01-30 ENCOUNTER — Other Ambulatory Visit: Payer: Self-pay

## 2023-01-30 ENCOUNTER — Ambulatory Visit (INDEPENDENT_AMBULATORY_CARE_PROVIDER_SITE_OTHER): Payer: BLUE CROSS/BLUE SHIELD | Admitting: Infectious Diseases

## 2023-01-30 VITALS — BP 149/89 | HR 52 | Temp 97.4°F | Resp 16 | Wt 132.0 lb

## 2023-01-30 DIAGNOSIS — B9689 Other specified bacterial agents as the cause of diseases classified elsewhere: Secondary | ICD-10-CM

## 2023-01-30 DIAGNOSIS — Z7984 Long term (current) use of oral hypoglycemic drugs: Secondary | ICD-10-CM

## 2023-01-30 DIAGNOSIS — N760 Acute vaginitis: Secondary | ICD-10-CM

## 2023-01-30 DIAGNOSIS — E1140 Type 2 diabetes mellitus with diabetic neuropathy, unspecified: Secondary | ICD-10-CM | POA: Diagnosis not present

## 2023-01-30 DIAGNOSIS — B20 Human immunodeficiency virus [HIV] disease: Secondary | ICD-10-CM

## 2023-01-30 MED ORDER — BIKTARVY 50-200-25 MG PO TABS: 1.0000 | ORAL_TABLET | Freq: Every day | ORAL | 11 refills | Status: DC

## 2023-01-30 MED ORDER — FLUCONAZOLE 150 MG PO TABS: 150.0000 mg | ORAL_TABLET | Freq: Every day | ORAL | 0 refills | Status: DC

## 2023-01-30 NOTE — Addendum Note (Signed)
Addended by: Harley Alto on: 01/30/2023 01:27 PM   Modules accepted: Orders

## 2023-01-30 NOTE — Progress Notes (Signed)
Name: Ashley Vaughn  DOB: December 12, 1965 MRN: [id number] PCP: Marrianne Mood, MD    Subjective:   Chief Complaint  Patient presents with   Follow-up    B20      Discussed the use of AI scribe software for clinical note transcription with the patient, who gave verbal consent to proceed.  History of Present Illness   Ashley Vaughn, with a history of diabetes and HIV presents for a routine checkup and medication refills. She reports overall improvement in her health status, with no new issues related to her medication, specifically Biktarvy. However, she continues to experience residual pain from a previous surgical procedure and abscess.  The patient's diabetes management appears to be suboptimal, as indicated by her recent return to insulin therapy. She reports no specific concerns or issues related to her diabetes at this time. Her A1C is > 14% after recent POC test.   She expresses symptoms related to what she believes is a vaginal yeast infection. She describes a recent onset of a thick, creamy vaginal discharge, which she finds unpleasant. She denies recent antibiotic use. She has not tried any OTC remedies for this.   The patient underwent a hysterectomy approximately 20 years ago, eliminating the need for further cervical cancer screenings. She reports no other gynecological concerns at this time.          01/30/2023   10:38 AM  Depression screen PHQ 2/9  Decreased Interest 0  Down, Depressed, Hopeless 0  PHQ - 2 Score 0    Review of Systems  Constitutional:  Negative for chills, fever, malaise/fatigue and weight loss.  HENT:  Negative for sore throat.        No dental problems  Respiratory:  Negative for cough and sputum production.   Cardiovascular:  Negative for chest pain and leg swelling.  Gastrointestinal:  Negative for abdominal pain, diarrhea and vomiting.  Genitourinary:  Negative for dysuria and flank pain.  Musculoskeletal:  Negative for joint pain, myalgias and neck  pain.  Skin:  Negative for rash.  Neurological:  Negative for dizziness, tingling and headaches.  Psychiatric/Behavioral:  Negative for depression and substance abuse. The patient is not nervous/anxious and does not have insomnia.     Past Medical History:  Diagnosis Date   AKI (acute kidney injury) (HCC) 08/24/2021   CVA (cerebral vascular accident) (HCC) 07/20/2021   Depression    Diabetes mellitus    Fistula    Heart failure (HCC)    HIV infection (HCC)    HSV (herpes simplex virus) anogenital infection 03/12/2019   Postoperative infection 07/18/2021   Patient had appendectomy early in May, followed by a hospitalization and drain placement for an intrabdominal infection and abscess.   Postprocedural intraabdominal abscess (HCC) 07/20/2021   Intra-abdominal abscess with fistulous communication between abscess cavity and cecum.   S/P laparoscopic appendectomy 06/22/2021   Seizure (HCC) 07/18/2021    Outpatient Medications Prior to Visit  Medication Sig Dispense Refill   Accu-Chek Softclix Lancets lancets Check blood sugar 3 times per day 270 each 3   Blood Glucose Monitoring Suppl (ACCU-CHEK GUIDE) w/Device KIT To monitor blood sugar, use in the morning before first meal of the day. 1 kit 0   carvedilol (COREG) 3.125 MG tablet Take 1 tablet (3.125 mg total) by mouth 2 (two) times daily with a meal. 60 tablet 3   Continuous Glucose Receiver (DEXCOM G7 RECEIVER) DEVI For glucose monitoring 1 each 0   Continuous Glucose Sensor (DEXCOM G7 SENSOR) MISC  For glucose monitoring, replace every 2 weeks. 4 each 5   Continuous Glucose Sensor (FREESTYLE LIBRE 3 SENSOR) MISC Place 1 sensor on the skin every 14 days. Use to check glucose continuously 2 each 3   DULoxetine (CYMBALTA) 60 MG capsule Take 1 capsule (60 mg total) by mouth daily. 30 capsule 0   Empagliflozin-metFORMIN HCl (SYNJARDY) 12.06-998 MG TABS Take 1 tablet by mouth 2 (two) times daily. 60 tablet 3   Ensure (ENSURE) Take 237 mLs  by mouth 2 (two) times daily between meals. 237 mL 12   glucose blood (ACCU-CHEK GUIDE) test strip Use as instructed 100 each 12   insulin glargine-yfgn (SEMGLEE, YFGN,) 100 UNIT/ML injection Inject 0.06 mLs (6 Units total) into the skin daily. 10 mL 11   Insulin Pen Needle (PEN NEEDLES 3/16") 31G X 5 MM MISC Use once to inject insulin 100 each 3   leptospermum manuka honey (MEDIHONEY) PSTE paste Apply 1 Application topically daily. Apply once daily to sacral wound with gauze over top 44 mL 0   rosuvastatin (CRESTOR) 10 MG tablet TAKE ONE TABLET BY MOUTH DAILY 30 tablet 10   sacubitril-valsartan (ENTRESTO) 24-26 MG TAKE ONE TABLET BY MOUTH TWICE A DAY 60 tablet 5   BIKTARVY 50-200-25 MG TABS tablet TAKE ONE TABLET BY MOUTH DAILY 30 tablet 5   Semaglutide,0.25 or 0.5MG /DOS, 2 MG/3ML SOPN Inject 0.25 mg into the skin once a week. (Patient not taking: Reported on 09/10/2022) 3 mL 3   No facility-administered medications prior to visit.     Allergies  Allergen Reactions   Metformin And Related     Unknown reaction    Fluorescein Itching and Rash    INTRAVENOUSLY       Social History   Substance and Sexual Activity  Sexual Activity Never   Partners: Male   Comment: pt. declined condoms     Objective:   Vitals:   01/30/23 1036 01/30/23 1037  BP:  (!) 149/89  Pulse:  (!) 52  Resp:  16  Temp:  (!) 97.4 F (36.3 C)  TempSrc:  Temporal  SpO2:  100%  Weight: 132 lb (59.9 kg) 132 lb (59.9 kg)   Body mass index is 24.14 kg/m.  Physical Exam Constitutional:      Appearance: Normal appearance. She is not ill-appearing.  HENT:     Mouth/Throat:     Mouth: Mucous membranes are moist.     Pharynx: Oropharynx is clear.  Eyes:     General: No scleral icterus. Pulmonary:     Effort: Pulmonary effort is normal.  Neurological:     Mental Status: She is oriented to person, place, and time.  Psychiatric:        Mood and Affect: Mood normal.        Thought Content: Thought  content normal.     Lab Results Lab Results  Component Value Date   WBC 6.4 09/10/2022   HGB 8.5 (L) 09/10/2022   HCT 29.0 (L) 09/10/2022   MCV 69.2 (L) 09/10/2022   PLT 597 (H) 09/10/2022    Lab Results  Component Value Date   CREATININE 1.32 (H) 09/04/2022   BUN 12 09/04/2022   NA 138 09/04/2022   K 4.1 09/04/2022   CL 100 09/04/2022   CO2 23 09/04/2022    Lab Results  Component Value Date   ALT 25 07/29/2022   AST 25 07/29/2022   ALKPHOS 113 07/29/2022   BILITOT 0.3 07/29/2022    Lab Results  Component Value Date   CHOL 148 07/19/2021   HDL 34 (L) 07/19/2021   LDLCALC 90 07/19/2021   TRIG 118 07/19/2021   CHOLHDL 4.4 07/19/2021   HIV 1 RNA Quant  Date Value  09/10/2022 86 Copies/mL (H)  01/31/2022 Not Detected Copies/mL  08/28/2021 NOT DETECTED copies/mL   CD4 T Cell Abs (/uL)  Date Value  09/10/2022 1,201  08/28/2021 1,076  09/06/2019 1,491     Assessment & Plan:  Assessment and Plan    HIV -  Well controlled on Biktarvy. Update labs today. On statin therapy for primary prevention with known other risk factors. Deferred any vaccines today. Does not need further cervical cancer screenings as she is s/p hysterectomy 20 years ago and surgically absent cervix. No previous abnormal test results or LEEP procedures.  -Continue biktarvy -VL, Lipid, CD4 and BMP today  -she deferred vaccines  -FU in 9 months    Uncontrolled Diabetes Mellitus -  A1c elevated to 14. Insulin recently added to regimen. Likely contributing to current yeast infection. -Continue current insulin regimen per Internal medicine team   Vaginal Yeast Infection, Acute  Recent onset of symptoms. Likely secondary to elevated blood sugars. -Prescribe Fluconazole, 150 mg tab dosed daily for three days. -If symptoms persist, patient to call back.  General Health Maintenance -Refill medications as needed. -Declined flu shot. -No need for cervical cancer screenings due to previous  hysterectomy.      Meds ordered this encounter  Medications   bictegravir-emtricitabine-tenofovir AF (BIKTARVY) 50-200-25 MG TABS tablet    Sig: Take 1 tablet by mouth daily.    Dispense:  30 tablet    Refill:  11    Prescription Type::   Renewal   fluconazole (DIFLUCAN) 150 MG tablet    Sig: Take 1 tablet (150 mg total) by mouth daily.    Dispense:  3 tablet    Refill:  0   Orders Placed This Encounter  Procedures   HIV 1 RNA quant-no reflex-bld   RPR   Basic metabolic panel    Has the patient fasted?:   No   Lipid panel   Return in about 9 months (around 10/31/2023).     Rexene Alberts, MSN, NP-C [facility] Pager: [phone] Office: [phone]  01/30/23  11:08 AM

## 2023-01-30 NOTE — Patient Instructions (Addendum)
Your yeast infection is likely due to the elevated blood sugars - I am glad you are back on the insulin   Please take good care of your diabetes and see your internal medicine team regularly.   Stop by the lab on your way out.   Will send in some medicine for the yeast infection - take one tablet once a day for 3 days.

## 2023-01-31 LAB — BASIC METABOLIC PANEL
BUN/Creatinine Ratio: 20 (calc) (ref 6–22)
BUN: 29 mg/dL — ABNORMAL HIGH (ref 7–25)
CO2: 22 mmol/L (ref 20–32)
Calcium: 9.4 mg/dL (ref 8.6–10.4)
Chloride: 104 mmol/L (ref 98–110)
Creat: 1.43 mg/dL — ABNORMAL HIGH (ref 0.50–1.03)
Glucose, Bld: 268 mg/dL — ABNORMAL HIGH (ref 65–99)
Potassium: 4.4 mmol/L (ref 3.5–5.3)
Sodium: 137 mmol/L (ref 135–146)

## 2023-01-31 LAB — URINE CYTOLOGY ANCILLARY ONLY
Chlamydia: NEGATIVE
Comment: NEGATIVE
Comment: NEGATIVE
Comment: NORMAL
Neisseria Gonorrhea: NEGATIVE
Trichomonas: NEGATIVE

## 2023-01-31 LAB — LIPID PANEL
Cholesterol: 177 mg/dL (ref <200)
HDL: 43 mg/dL — ABNORMAL LOW (ref >=50)
LDL Cholesterol (Calc): 107 mg/dL — ABNORMAL HIGH
Non-HDL Cholesterol (Calc): 134 mg/dL — ABNORMAL HIGH (ref <130)
Total CHOL/HDL Ratio: 4.1 (calc) (ref <5.0)
Triglycerides: 157 mg/dL — ABNORMAL HIGH (ref <150)

## 2023-01-31 LAB — RPR: RPR Ser Ql: NONREACTIVE

## 2023-02-24 ENCOUNTER — Encounter: Payer: BLUE CROSS/BLUE SHIELD | Admitting: Student

## 2023-02-24 NOTE — Progress Notes (Deleted)
 CC: ***  HPI:  [name] is a 58 y.o. female living with a history stated below and presents today for ***. Please see problem based assessment and plan for additional details.  Past Medical History:  Diagnosis Date   AKI (acute kidney injury) (HCC) 08/24/2021   CVA (cerebral vascular accident) (HCC) 07/20/2021   Depression    Diabetes mellitus    Fistula    Heart failure (HCC)    HIV infection (HCC)    HSV (herpes simplex virus) anogenital infection 03/12/2019   Postoperative infection 07/18/2021   Patient had appendectomy early in May, followed by a hospitalization and drain placement for an intrabdominal infection and abscess.   Postprocedural intraabdominal abscess (HCC) 07/20/2021   Intra-abdominal abscess with fistulous communication between abscess cavity and cecum.   S/P laparoscopic appendectomy 06/22/2021   Seizure (HCC) 07/18/2021    Current Outpatient Medications on File Prior to Visit  Medication Sig Dispense Refill   Accu-Chek Softclix Lancets lancets Check blood sugar 3 times per day 270 each 3   bictegravir-emtricitabine -tenofovir  AF (BIKTARVY ) 50-200-25 MG TABS tablet Take 1 tablet by mouth daily. 30 tablet 11   Blood Glucose Monitoring Suppl (ACCU-CHEK GUIDE) w/Device KIT To monitor blood sugar, use in the morning before first meal of the day. 1 kit 0   carvedilol  (COREG ) 3.125 MG tablet Take 1 tablet (3.125 mg total) by mouth 2 (two) times daily with a meal. 60 tablet 3   Continuous Glucose Receiver (DEXCOM G7 RECEIVER) DEVI For glucose monitoring 1 each 0   Continuous Glucose Sensor (DEXCOM G7 SENSOR) MISC For glucose monitoring, replace every 2 weeks. 4 each 5   Continuous Glucose Sensor (FREESTYLE LIBRE 3 SENSOR) MISC Place 1 sensor on the skin every 14 days. Use to check glucose continuously 2 each 3   DULoxetine  (CYMBALTA ) 60 MG capsule Take 1 capsule (60 mg total) by mouth daily. 30 capsule 0   Empagliflozin -metFORMIN  HCl (SYNJARDY ) 12.06-998 MG  TABS Take 1 tablet by mouth 2 (two) times daily. 60 tablet 3   Ensure (ENSURE) Take 237 mLs by mouth 2 (two) times daily between meals. 237 mL 12   fluconazole  (DIFLUCAN ) 150 MG tablet Take 1 tablet (150 mg total) by mouth daily. 3 tablet 0   glucose blood (ACCU-CHEK GUIDE) test strip Use as instructed 100 each 12   insulin  glargine-yfgn (SEMGLEE , YFGN,) 100 UNIT/ML injection Inject 0.06 mLs (6 Units total) into the skin daily. 10 mL 11   Insulin  Pen Needle (PEN NEEDLES 3/16) 31G X 5 MM MISC Use once to inject insulin  100 each 3   leptospermum manuka honey (MEDIHONEY) PSTE paste Apply 1 Application topically daily. Apply once daily to sacral wound with gauze over top 44 mL 0   rosuvastatin  (CRESTOR ) 10 MG tablet TAKE ONE TABLET BY MOUTH DAILY 30 tablet 10   sacubitril -valsartan  (ENTRESTO ) 24-26 MG TAKE ONE TABLET BY MOUTH TWICE A DAY 60 tablet 5   Semaglutide ,0.25 or 0.5MG /DOS, 2 MG/3ML SOPN Inject 0.25 mg into the skin once a week. (Patient not taking: Reported on 09/10/2022) 3 mL 3   No current facility-administered medications on file prior to visit.     Review of Systems: ROS negative except for what is noted on the assessment and plan.  There were no vitals filed for this visit.  Physical Exam: Constitutional: Well appearing, not in acute distress. Cardiovascular: regular rate and rhythm, no m/r/g Pulmonary/Chest: normal work of breathing on room air, lungs clear to auscultation bilaterally Abdominal:  soft, non-tender, non-distended  Assessment & Plan:   Poorly controlled type 2 diabetes mellitus with neuropathy (HCC) Unfortunately A1c is now 13.1, it was 10.1 four months ago.  She reports adherence with her Synjardy .  She admits to eating poorly.  She does have limited income, difficult not to eat processed foods.  In the past she was on Ozempic , does not care for it, skeptical of the medicine and its side effects.  Injections do not bother her, in fact she would prefer insulin   over a GLP-1 agonist. - Restart insulin .  Has been on in the past.  Due to food insecurity, there is a risk of hypoglycemia, she is quite aware of this risk.  He says she has canned fruit available for low blood sugar.  Will start low 6 units long-acting glargine per day.  Will attempt to get her a Dexcom sensor.  Will place referral for meeting with our excellent diabetes educator Arland. - Max out Synjardy , empagliflozin -metformin  to 12.06-998 mg twice daily - RTC in 1 month -Continue Cymbalta  60 daily for neuropathy   Hypertension At goal.  129/80.  Diabetes goal 130/80. - Continue Entresto  24-26 twice daily, Coreg  3.125 twice daily.  These are part of her HFrEF GDMT.   Right hip pain Progressive and mild right hip pain.  3 out of 10.  Sleeps on a couch, which exacerbates it.  Exam unremarkable.  Some self treatment with ibuprofen and Tylenol  which helps somewhat.  Do not feel that imaging is warranted at this time.  Most likely arthritis.  She would like to see physical therapy. - Physical therapy referral   At risk for sleep apnea She is reporting nighttime episodes of apnea.  Unclear if she is snoring.  She is not obese.  History of heart disease. - Will place order for sleep study  Patient {GC/GE:[id number]::discussed with,seen with} Dr. {[id number]::Tpoopjfd,Z. Hoffman,Mullen,Narendra,Vincent,Guilloud,Lau,Machen}  No problem-specific Assessment & Plan notes found for this encounter.   Missy Sandhoff, MD [facility], PGY-1 Phone: [phone] Date 02/24/2023 Time 6:42 AM

## 2023-03-07 ENCOUNTER — Telehealth: Payer: Self-pay

## 2023-03-07 NOTE — Telephone Encounter (Signed)
**  Called patient to inform that [employer] is OON, patient stated she will call insurnace**02/25/23- ANC  **Followed up with patient, she stated that she called [employer] already and wasn't able to get a resolution. I informed that if she were to come to the appt on Monday she could possibly be financially responsible for the entirety of the bill. She opted to cancel the appt until she gets the [employer]BS inactive.

## 2023-03-10 ENCOUNTER — Ambulatory Visit: Payer: BLUE CROSS/BLUE SHIELD

## 2023-03-11 ENCOUNTER — Encounter: Payer: BLUE CROSS/BLUE SHIELD | Admitting: Student

## 2023-03-11 NOTE — Progress Notes (Deleted)
   Established Patient Office Visit  Subjective   Patient ID: Ashley Vaughn, female    DOB: October 22, 1965  Age: 58 y.o. MRN: [id number]  No chief complaint on file.   Ashley Vaughn is a 58 y.o. who presents to the clinic for one month follow up of poorly controlled T2DM. Please see problem based assessment and plan for additional details.   Patient Active Problem List   Diagnosis Date Noted   Right hip pain 01/23/2023   At risk for sleep apnea 01/23/2023   Sacral decubitus ulcer, stage II (HCC) 09/10/2022   Anemia 09/04/2022   Abdominal wall abscess 09/04/2022   Irregular heartbeat 06/12/2022   Orthostatic dizziness 06/12/2022   Mild cognitive impairment 09/27/2021   Weight loss 09/04/2021   HFrEF (heart failure with reduced ejection fraction) (HCC) 07/27/2021   Hypertension 12/21/2020   Healthcare maintenance 03/18/2012   Major depressive disorder, single episode, severe (HCC) 01/05/2010   Human immunodeficiency virus (HIV) disease (HCC) 10/13/2008   Poorly controlled type 2 diabetes mellitus with neuropathy (HCC) 10/29/1999      Objective:     There were no vitals taken for this visit. BP Readings from Last 3 Encounters:  01/30/23 (!) 149/89  01/23/23 129/80  12/22/22 91/70   Wt Readings from Last 3 Encounters:  01/30/23 132 lb (59.9 kg)  01/23/23 134 lb (60.8 kg)  09/10/22 118 lb (53.5 kg)      Physical Exam   No results found for any visits on 03/11/23.  Last hemoglobin A1c Lab Results  Component Value Date   HGBA1C 13.1 (A) 01/23/2023      The ASCVD Risk score (Arnett DK, et al., 2019) failed to calculate for the following reasons:   Risk score cannot be calculated because patient has a medical history suggesting prior/existing ASCVD    Assessment & Plan:   Problem List Items Addressed This Visit   None Poorly controlled T2DM Last seen on 12/5; started on 6 units lantus -Max out [place], empagliflozin-metformin to 12.06-998 mg BID -Fasting BG in the  AM? -Hypoglycemia?, food insecurities?  -See donna? -ophthalmology?  Preventive: Colonoscopy, pap smear, covid, Tdap, influenza, shingrix, mammogram    No follow-ups on file.    Faith Rogue, DO

## 2023-03-28 ENCOUNTER — Telehealth: Payer: Self-pay

## 2023-03-28 NOTE — Telephone Encounter (Signed)
 Pt called to verify what coverage we have on file for her. I did state that at the moment we are showing two verified coverages. One being the [facility] and the other being the [facility] [place] plan.   She was not pleased that the [employer] is still on file and stated no one has seemed to understand she does not have the coverage. I did ask if she has reached out to [facility] to ensure that this coverage is cancelled, she stated that it was.   I did remove the [employer] for her from the filing order of her upcoming appt, but did state that at this time this is still coming back as a verified coverage and until it is cancelled some offices may still ask or verify this with her. She did voice understanding.   I also verified her next upcoming appt for her as well.

## 2023-04-11 ENCOUNTER — Other Ambulatory Visit (HOSPITAL_COMMUNITY): Payer: Self-pay | Admitting: Cardiology

## 2023-04-15 ENCOUNTER — Other Ambulatory Visit: Payer: Self-pay | Admitting: Student

## 2023-04-15 ENCOUNTER — Telehealth: Payer: Self-pay | Admitting: *Deleted

## 2023-04-15 ENCOUNTER — Ambulatory Visit (INDEPENDENT_AMBULATORY_CARE_PROVIDER_SITE_OTHER): Payer: MEDICAID | Admitting: Student

## 2023-04-15 VITALS — BP 134/81 | HR 81 | Temp 97.7°F | Ht 62.0 in | Wt 141.2 lb

## 2023-04-15 DIAGNOSIS — I5020 Unspecified systolic (congestive) heart failure: Secondary | ICD-10-CM | POA: Diagnosis not present

## 2023-04-15 DIAGNOSIS — B20 Human immunodeficiency virus [HIV] disease: Secondary | ICD-10-CM

## 2023-04-15 DIAGNOSIS — Z1231 Encounter for screening mammogram for malignant neoplasm of breast: Secondary | ICD-10-CM

## 2023-04-15 NOTE — Telephone Encounter (Signed)
 Mammogram appointment March 5.2025 @ 9:30 am/ arrive 9:15 am. Patient in office with appointment in hand.  Patient  aware of the 75.00 no show fee. Instructed to contact breast center office to reschedule or cancel appointment @ [phone].

## 2023-04-15 NOTE — Progress Notes (Signed)
 CC: Bilateral foot swelling   HPI:  [name] is a 58 y.o. female living with a history stated below and presents today for foot swelling in both feet and also discuss her other chronic conditions. Please see problem based assessment and plan for additional details.  Past Medical History:  Diagnosis Date   AKI (acute kidney injury) (HCC) 08/24/2021   CVA (cerebral vascular accident) (HCC) 07/20/2021   Depression    Diabetes mellitus    Fistula    Heart failure (HCC)    HIV infection (HCC)    HSV (herpes simplex virus) anogenital infection 03/12/2019   Postoperative infection 07/18/2021   Patient had appendectomy early in May, followed by a hospitalization and drain placement for an intrabdominal infection and abscess.   Postprocedural intraabdominal abscess (HCC) 07/20/2021   Intra-abdominal abscess with fistulous communication between abscess cavity and cecum.   S/P laparoscopic appendectomy 06/22/2021   Seizure (HCC) 07/18/2021    Current Outpatient Medications on File Prior to Visit  Medication Sig Dispense Refill   Accu-Chek Softclix Lancets lancets Check blood sugar 3 times per day 270 each 3   bictegravir-emtricitabine-tenofovir AF (BIKTARVY) 50-200-25 MG TABS tablet Take 1 tablet by mouth daily. 30 tablet 11   Blood Glucose Monitoring Suppl (ACCU-CHEK GUIDE) w/Device KIT To monitor blood sugar, use in the morning before first meal of the day. 1 kit 0   carvedilol (COREG) 3.125 MG tablet Take 1 tablet (3.125 mg total) by mouth 2 (two) times daily with a meal. 60 tablet 3   Continuous Glucose Receiver (DEXCOM G7 RECEIVER) DEVI For glucose monitoring 1 each 0   Continuous Glucose Sensor (DEXCOM G7 SENSOR) MISC For glucose monitoring, replace every 2 weeks. 4 each 5   Continuous Glucose Sensor (FREESTYLE LIBRE 3 SENSOR) MISC Place 1 sensor on the skin every 14 days. Use to check glucose continuously 2 each 3   DULoxetine (CYMBALTA) 60 MG capsule Take 1 capsule (60 mg  total) by mouth daily. 30 capsule 0   Empagliflozin-metFORMIN HCl (SYNJARDY) 12.06-998 MG TABS Take 1 tablet by mouth 2 (two) times daily. 60 tablet 3   Ensure (ENSURE) Take 237 mLs by mouth 2 (two) times daily between meals. 237 mL 12   fluconazole (DIFLUCAN) 150 MG tablet Take 1 tablet (150 mg total) by mouth daily. 3 tablet 0   glucose blood (ACCU-CHEK GUIDE) test strip Use as instructed 100 each 12   insulin glargine-yfgn (SEMGLEE, YFGN,) 100 UNIT/ML injection Inject 0.06 mLs (6 Units total) into the skin daily. 10 mL 11   Insulin Pen Needle (PEN NEEDLES 3/16") 31G X 5 MM MISC Use once to inject insulin 100 each 3   leptospermum manuka honey (MEDIHONEY) PSTE paste Apply 1 Application topically daily. Apply once daily to sacral wound with gauze over top 44 mL 0   rosuvastatin (CRESTOR) 10 MG tablet TAKE ONE TABLET BY MOUTH DAILY 30 tablet 10   sacubitril-valsartan (ENTRESTO) 24-26 MG Take 1 tablet by mouth 2 (two) times daily. NEEDS FOLLOW UP APPOINTMENT FOR MORE REFILLS 60 tablet 2   Semaglutide,0.25 or 0.5MG /DOS, 2 MG/3ML SOPN Inject 0.25 mg into the skin once a week. (Patient not taking: Reported on 09/10/2022) 3 mL 3   No current facility-administered medications on file prior to visit.    Family History  Problem Relation Age of Onset   Diabetes Mother    Hypertension Mother    Heart disease Father    Arthritis Father    Diabetes Maternal Uncle  Social History   Socioeconomic History   Marital status: Legally Separated    Spouse name: Not on file   Number of children: Not on file   Years of education: Not on file   Highest education level: Not on file  Occupational History   Not on file  Tobacco Use   Smoking status: Never    Passive exposure: Current   Smokeless tobacco: Never  Vaping Use   Vaping status: Never Used  Substance and Sexual Activity   Alcohol use: Not Currently    Comment: occasional   Drug use: No    Comment: h/o cocaine abuse, clean since 2009    Sexual activity: Never    Partners: Male    Comment: pt. declined condoms  Other Topics Concern   Not on file  Social History Narrative   Not on file   Social Drivers of Health   Financial Resource Strain: High Risk (11/01/2021)   Overall Financial Resource Strain (CARDIA)    Difficulty of Paying Living Expenses: Very hard  Food Insecurity: Food Insecurity Present (12/22/2022)   Hunger Vital Sign    Worried About Running Out of Food in the Last Year: Sometimes true    Ran Out of Food in the Last Year: Sometimes true  Transportation Needs: No Transportation Needs (12/22/2022)   PRAPARE - Administrator, Civil Service (Medical): No    Lack of Transportation (Non-Medical): No  Physical Activity: Not on file  Stress: Not on file  Social Connections: Socially Isolated (01/02/2022)   Social Connection and Isolation Panel [NHANES]    Frequency of Communication with Friends and Family: Once a week    Frequency of Social Gatherings with Friends and Family: Once a week    Attends Religious Services: Never    Database administrator or Organizations: No    Attends Banker Meetings: Never    Marital Status: Separated  Intimate Partner Violence: Not At Risk (12/22/2022)   Humiliation, Afraid, Rape, and Kick questionnaire    Fear of Current or Ex-Partner: No    Emotionally Abused: No    Physically Abused: No    Sexually Abused: No    Review of Systems: ROS negative except for what is noted on the assessment and plan.  Vitals:   04/15/23 0900  BP: 134/81  Pulse: 81  Temp: 97.7 F (36.5 C)  TempSrc: Oral  SpO2: 100%  Weight: 141 lb 3.2 oz (64 kg)  Height: 5\' 2"  (1.575 m)    Physical Exam: Constitutional: well-appearing woman, sitting in chair, in no acute distress HENT: normocephalic atraumatic, mucous membranes moist Eyes: conjunctiva non-erythematous Cardiovascular: regular rate and rhythm, no m/r/g Pulmonary/Chest: normal work of breathing on room air,  lungs clear to auscultation bilaterally Abdominal: soft, non-tender, non-distended MSK: Mild edema appreciated at ankle level,  LE above ankle is not edematous with no appreciable pitting  Neurological: alert & oriented x 3, no focal deficit Skin: warm and dry Psych: normal mood and behavior  Assessment & Plan:   Human immunodeficiency virus (HIV) disease (HCC) Hx of HIV on Biktarvy,has a great follow up with ID.Endorses good adherence to her regimen.No concerns at this time.  - Counseled on medication adherence  - Follow ups with ID   HFrEF (heart failure with reduced ejection fraction) (HCC) Patient presents with bilateral lower extremity edema which she says has been intermittent since 2022. She has also notably gained  about 9 pounds since December 2024 which she attributes to  eating a lot of fast food.  In the setting of this new onset of weight gain in this patient who has a history of heart failure with reduced ejection fraction ( Last echo showed Left ventricular ejection fraction, by estimation, of 30 to 35% , 07/2021), on no diuretics and found to have mild  bilateral lower extremity edema at the ankle level, I wonder if her heart failure is getting worse. She however does not have shortness of breath, does not endorse orthopnea, and is not volume overloaded on exam with no appreciable abnormal heart sounds or crackles in the lungs, I do not think she is in acute heart failure exacerbation at this time.  I will recommend leg elevation and compression socks at this time.  Will also advise patient on a low-salt diet.  If patient's lower extremity edema continue to worsen despite compression socks and leg elevation and staying on a low-salt diet, I will get an echo to further assess her cardiac function  and refer her to her cardiologist.  Seems like she has not followed up with a cardiologist for quite some time now due to insurance issues.  She  reports her insurance is noa active and will  follow-up with her cardiologist soon.Will also get a CBC and BMP to assess electrolytes and kidney function as well as anemia . EGFR in 07/2022 was < 50. -Compression socks -Leg elevation as needed -Low-salt diet -Follow-up with cardiologist - CBC - BMP    Patient discussed with Dr.  Rudi Heap, M.D [facility] Phone: [phone] Date 04/15/2023 Time 1:24 PM

## 2023-04-15 NOTE — Assessment & Plan Note (Addendum)
 Patient presents with bilateral lower extremity edema which she says has been intermittent since 2022. She has also notably gained  about 9 pounds since December 2024 which she attributes to eating a lot of fast food.  In the setting of this new onset of weight gain in this patient who has a history of heart failure with reduced ejection fraction ( Last echo showed Left ventricular ejection fraction, by estimation, of 30 to 35% , 07/2021), on no diuretics and found to have mild  bilateral lower extremity edema at the ankle level, I wonder if her heart failure is getting worse. She however does not have shortness of breath, does not endorse orthopnea, and is not volume overloaded on exam with no appreciable abnormal heart sounds or crackles in the lungs, I do not think she is in acute heart failure exacerbation at this time.  I will recommend leg elevation and compression socks at this time.  Will also advise patient on a low-salt diet.  If patient's lower extremity edema continue to worsen despite compression socks and leg elevation and staying on a low-salt diet, I will get an echo to further assess her cardiac function  and refer her to her cardiologist.  Seems like she has not followed up with a cardiologist for quite some time now due to insurance issues.  She  reports her insurance is noa active and will follow-up with her cardiologist soon.Will also get a CBC and BMP to assess electrolytes and kidney function as well as anemia . EGFR in 07/2022 was < 50. -Compression socks -Leg elevation as needed -Low-salt diet -Follow-up with cardiologist - CBC - BMP

## 2023-04-15 NOTE — Patient Instructions (Signed)
 Thank you, Ashley Vaughn Ashley Vaughn for allowing [place] to provide your care today. Today we discussed your foot swelling.You are not on any medication that could cause your swelling.  I also do not think you have an acute exacerbation of your heart failure at this time and your kidney function has also been stable for the past few months.  I would recommend that you use compression socks and elevate your legs.  Please also try your best to reduce your salt intake at this time.  If your leg continues to swell after trying compression socks and leg elevation,  I have ordered the following labs for you:  Lab Orders  No laboratory test(s) ordered today     Tests ordered today:    Referrals ordered today:   Referral Orders  No referral(s) requested today     I have ordered the following medication/changed the following medications:   Stop the following medications: There are no discontinued medications.   Start the following medications: No orders of the defined types were placed in this encounter.    Follow up: One month    Remember:   Should you have any questions or concerns please call the internal medicine clinic at [phone].    Kathleen Lime, M.D [facility]

## 2023-04-15 NOTE — Assessment & Plan Note (Signed)
 Hx of HIV on Biktarvy,has a great follow up with ID.Endorses good adherence to her regimen.No concerns at this time.  - Counseled on medication adherence  - Follow ups with ID

## 2023-04-16 LAB — BASIC METABOLIC PANEL
BUN/Creatinine Ratio: 11 (ref 9–23)
BUN: 15 mg/dL (ref 6–24)
CO2: 19 mmol/L — ABNORMAL LOW (ref 20–29)
Calcium: 8.9 mg/dL (ref 8.7–10.2)
Chloride: 104 mmol/L (ref 96–106)
Creatinine, Ser: 1.38 mg/dL — ABNORMAL HIGH (ref 0.57–1.00)
Glucose: 181 mg/dL — ABNORMAL HIGH (ref 70–99)
Potassium: 4 mmol/L (ref 3.5–5.2)
Sodium: 140 mmol/L (ref 134–144)
eGFR: 45 mL/min/{1.73_m2} — ABNORMAL LOW (ref >59)

## 2023-04-16 LAB — CBC
Hematocrit: 33.8 % — ABNORMAL LOW (ref 34.0–46.6)
Hemoglobin: 10.2 g/dL — ABNORMAL LOW (ref 11.1–15.9)
MCH: 21.8 pg — ABNORMAL LOW (ref 26.6–33.0)
MCHC: 30.2 g/dL — ABNORMAL LOW (ref 31.5–35.7)
MCV: 72 fL — ABNORMAL LOW (ref 79–97)
Platelets: 299 10*3/uL (ref 150–450)
RBC: 4.67 x10E6/uL (ref 3.77–5.28)
RDW: 15 % (ref 11.7–15.4)
WBC: 4 10*3/uL (ref 3.4–10.8)

## 2023-04-16 NOTE — Progress Notes (Signed)
 Internal Medicine Clinic Attending  I was physically present during the key portions of the resident provided service and participated in the medical decision making of patient's management care. I reviewed pertinent patient test results.  The assessment, diagnosis, and plan were formulated together and I agree with the documentation in the resident's note.  Carney Living, MD

## 2023-04-23 ENCOUNTER — Ambulatory Visit: Payer: MEDICAID

## 2023-04-28 ENCOUNTER — Other Ambulatory Visit: Payer: MEDICAID

## 2023-04-28 ENCOUNTER — Other Ambulatory Visit: Payer: Self-pay

## 2023-04-28 DIAGNOSIS — B20 Human immunodeficiency virus [HIV] disease: Secondary | ICD-10-CM

## 2023-04-28 NOTE — Progress Notes (Signed)
 Marland Kitchen  cd

## 2023-04-29 LAB — T-HELPER CELLS (CD4) COUNT (NOT AT ARMC)
CD4 % Helper T Cell: 39 % (ref 33–65)
CD4 T Cell Abs: 1176 /uL (ref 400–1790)

## 2023-04-30 ENCOUNTER — Ambulatory Visit: Payer: Self-pay | Admitting: Student

## 2023-04-30 ENCOUNTER — Telehealth: Payer: Self-pay

## 2023-04-30 DIAGNOSIS — N760 Acute vaginitis: Secondary | ICD-10-CM

## 2023-04-30 MED ORDER — FLUCONAZOLE 150 MG PO TABS
150.0000 mg | ORAL_TABLET | Freq: Every day | ORAL | 0 refills | Status: DC
Start: 2023-04-30 — End: 2024-01-05

## 2023-04-30 NOTE — Telephone Encounter (Addendum)
 Copied From CRM [id number]. Reason for Triage: The patient states she may have a yeast infection or a bacterial infection in her vaginal area - she is experiencing heavy itchiness causing discomfort and thick white discharge. Callback [phone]   Chief Complaint: vaginal discharge Symptoms: milky color vaginal discharge and itching Frequency: yesterday Pertinent Negatives: Patient denies odor Disposition: [] ED /[] Urgent Care (no appt availability in office) / [] Appointment(In office/virtual)/ []  [facility]/ [] Home Care/ [] Refused Recommended Disposition /[x] [facility]/ []  Follow-up with PCP Additional Notes: pt states has hx of bacteria infection and would like for PCP to send prescription to help with s/s  Additional Notes: pt has history of BV and would like to request   Answer Assessment - Initial Assessment Questions 1. DISCHARGE: "Describe the discharge." (e.g., white, yellow, green, gray, foamy, cottage cheese-like)     Milky color 2. ODOR: "Is there a bad odor?"     no 3. ONSET: "When did the discharge begin?"     yesterday 4. RASH: "Is there a rash in the genital area?" If Yes, ask: "Describe it." (e.g., redness, blisters, sores, bumps)     no 5. ABDOMEN PAIN: "Are you having any abdomen pain?" If Yes, ask: "What does it feel like? " (e.g., crampy, dull, intermittent, constant)      no 6. ABDOMEN PAIN SEVERITY: If present, ask: "How bad is it?" (e.g., Scale 1-10; mild, moderate, or severe)   - MILD (1-3): Doesn't interfere with normal activities, abdomen soft and not tender to touch.    - MODERATE (4-7): Interferes with normal activities or awakens from sleep, abdomen tender to touch.    - SEVERE (8-10): Excruciating pain, doubled over, unable to do any normal activities. (R/O peritonitis)      0 7. CAUSE: "What do you think is causing the discharge?" "Have you had the same problem before? What happened then?"     Yes bacterial infection 8. OTHER  SYMPTOMS: "Do you have any other symptoms?" (e.g., fever, itching, vaginal bleeding, pain with urination, injury to genital area, vaginal foreign body)     Itching 9. PREGNANCY: "Is there any chance you are pregnant?" "When was your last menstrual period?"     N/a  Protocols used: Vaginal Discharge-A-AH

## 2023-04-30 NOTE — Telephone Encounter (Signed)
 Rx sent in for fluconazole - if she still has drainage I would say lets have her come for a vaginal swab - she is at risk for candida glabratta which is not as easily treated.

## 2023-04-30 NOTE — Telephone Encounter (Signed)
 Patient aware.

## 2023-04-30 NOTE — Addendum Note (Signed)
 Addended by: Blanchard Kelch on: 04/30/2023 04:18 PM   Modules accepted: Orders

## 2023-04-30 NOTE — Telephone Encounter (Signed)
 Patient called stating that she is having vaginal itching and discharge x 1 day. Fluconazole helped in the past, but keeps coming back due to uncontrolled diabetes. Requesting RX to go to [facility].     Abrar Bilton Lesli Albee, CMA

## 2023-04-30 NOTE — Telephone Encounter (Signed)
 Reason for Disposition . [1] Symptoms of a "yeast infection" (i.e., itchy, white discharge, not bad smelling) AND [2] not improved > 3 days following Care Advice    Offered appt: pt stated would like to see if PCP would send Rx since pt has hx of bacteria infefction  Protocols used: Vaginal Discharge-A-AH

## 2023-05-02 LAB — HIV-1 RNA QUANT-NO REFLEX-BLD
HIV 1 RNA Quant: <20 {copies}/mL — ABNORMAL HIGH
HIV-1 RNA Quant, Log: <1.3 {Log_copies}/mL — ABNORMAL HIGH

## 2023-05-13 ENCOUNTER — Encounter: Payer: MEDICAID | Admitting: Student

## 2023-05-19 ENCOUNTER — Other Ambulatory Visit: Payer: Self-pay | Admitting: Student

## 2023-05-19 ENCOUNTER — Other Ambulatory Visit: Payer: Self-pay | Admitting: *Deleted

## 2023-05-20 ENCOUNTER — Telehealth: Payer: Self-pay | Admitting: *Deleted

## 2023-05-20 NOTE — Telephone Encounter (Signed)
 Mammogram appointemnt 05-23-2023 @ 9:40 am (r/s from 04-23-2023)

## 2023-05-23 ENCOUNTER — Ambulatory Visit: Payer: MEDICAID

## 2023-06-03 ENCOUNTER — Telehealth: Payer: Self-pay | Admitting: *Deleted

## 2023-06-03 NOTE — Telephone Encounter (Signed)
 Patient was a NO SHOW for mammogram appointment(04/23/23 rescheduled to 05/23/23).

## 2023-06-05 ENCOUNTER — Other Ambulatory Visit: Payer: Self-pay | Admitting: Student

## 2023-06-05 DIAGNOSIS — E1140 Type 2 diabetes mellitus with diabetic neuropathy, unspecified: Secondary | ICD-10-CM

## 2023-06-05 NOTE — Telephone Encounter (Unsigned)
 Copied from CRM [id number]. Topic: Clinical - Medication Refill >> Jun 05, 2023  2:09 PM Brynn Caras wrote: Most Recent Primary Care Visit:  Provider: Fay Hoop  Department: [facility]  Visit Type: OPEN ESTABLISHED  Date: 04/15/2023  Medication: Continuous Glucose Sensor (DEXCOM G7 SENSOR) and Continuous Glucose Receiver (DEXCOM G7 RECEIVER) DEVI   Has the patient contacted their pharmacy? No (Agent: If no, request that the patient contact the pharmacy for the refill. If patient does not wish to contact the pharmacy document the reason why and proceed with request.) (Agent: If yes, when and what did the pharmacy advise?)  Is this the correct pharmacy for this prescription? No If no, delete pharmacy and type the correct one.  This is the patient's preferred pharmacy:  [facility] - [place], [place] - [address] [facility] [address] [place] [address] Phone: [phone] Fax: [phone]  [facility] [phone] - [place], [place] - [place] [address] AT [facility] [address] [facility] [place] [address] Phone: [phone] Fax: [phone]   Has the prescription been filled recently? No  Is the patient out of the medication? Yes  Has the patient been seen for an appointment in the last year OR does the patient have an upcoming appointment? Yes  Can we respond through MyChart? Yes  Agent: Please be advised that Rx refills may take up to 3 business days. We ask that you follow-up with your pharmacy.

## 2023-06-09 ENCOUNTER — Ambulatory Visit: Payer: Self-pay | Admitting: Student

## 2023-06-09 NOTE — Progress Notes (Deleted)
 Established Patient Office Visit  Subjective   Patient ID: Ashley Vaughn, female    DOB: [birth year]  Age: 58 y.o. MRN: [id number]  No chief complaint on file.   HPI This is a 58 year old female living with a history stated below and presents today for follow up on DM, HLD, and HFrEF. Please see problem based assessment and plan for additional details.   Past Medical History:  Diagnosis Date   AKI (acute kidney injury) (HCC) 08/24/2021   CVA (cerebral vascular accident) (HCC) 07/20/2021   Depression    Diabetes mellitus    Fistula    Heart failure (HCC)    HIV infection (HCC)    HSV (herpes simplex virus) anogenital infection 03/12/2019   Postoperative infection 07/18/2021   Patient had appendectomy early in May, followed by a hospitalization and drain placement for an intrabdominal infection and abscess.   Postprocedural intraabdominal abscess (HCC) 07/20/2021   Intra-abdominal abscess with fistulous communication between abscess cavity and cecum.   S/P laparoscopic appendectomy 06/22/2021   Seizure (HCC) 07/18/2021      ROS   As per assessment and plan  Objective:     There were no vitals taken for this visit. BP Readings from Last 3 Encounters:  04/15/23 134/81  01/30/23 (!) 149/89  01/23/23 129/80   Wt Readings from Last 3 Encounters:  04/15/23 141 lb 3.2 oz (64 kg)  01/30/23 132 lb (59.9 kg)  01/23/23 134 lb (60.8 kg)   SpO2 Readings from Last 3 Encounters:  04/15/23 100%  01/30/23 100%  01/23/23 100%      Physical Exam   No results found for any visits on 06/09/23.  Last CBC Lab Results  Component Value Date   WBC 4.0 04/15/2023   HGB 10.2 (L) 04/15/2023   HCT 33.8 (L) 04/15/2023   MCV 72 (L) 04/15/2023   MCH 21.8 (L) 04/15/2023   RDW 15.0 04/15/2023   PLT 299 04/15/2023   Last metabolic panel Lab Results  Component Value Date   GLUCOSE 181 (H) 04/15/2023   NA 140 04/15/2023   K 4.0 04/15/2023   CL 104 04/15/2023   CO2 19 (L)  04/15/2023   BUN 15 04/15/2023   CREATININE 1.38 (H) 04/15/2023   EGFR 45 (L) 04/15/2023   CALCIUM  8.9 04/15/2023   PHOS 3.2 07/19/2021   PROT 7.4 07/29/2022   ALBUMIN 2.2 (L) 07/29/2022   BILITOT 0.3 07/29/2022   ALKPHOS 113 07/29/2022   AST 25 07/29/2022   ALT 25 07/29/2022   ANIONGAP 15 09/04/2022   Last lipids Lab Results  Component Value Date   CHOL 177 01/30/2023   HDL 43 (L) 01/30/2023   LDLCALC 107 (H) 01/30/2023   TRIG 157 (H) 01/30/2023   CHOLHDL 4.1 01/30/2023   Last hemoglobin A1c Lab Results  Component Value Date   HGBA1C 13.1 (A) 01/23/2023      The ASCVD Risk score (Arnett DK, et al., 2019) failed to calculate for the following reasons:   Risk score cannot be calculated because patient has a medical history suggesting prior/existing ASCVD    Assessment & Plan:  Patient is discussed with Dr   Diabetes Lab Results  Component Value Date   HGBA1C 13.1 (A) 01/23/2023   Last A1c 13.1 on 01/23/2023; recheck today OP medication Synjardy  12.06-998 mg BID, Lantus  6 units.   HFrEF ECHO LVEF 30-35%, decreased LV function, normal RV. [facility] 12/2021 showed lesion 20% in the proximal LAD and proximal OM1. Her last  cardiology OV 08/2022. GDMT Entresto  24-26 twice daily, Coreg  3.125 twice daily. +Aldactone  12.5 mg and Jardiance  25 mg   HLD Last LDL 107, goal < 70.    Problem List Items Addressed This Visit   None   No follow-ups on file.    Lanney Pitts, DO

## 2023-06-16 MED ORDER — DEXCOM G7 RECEIVER DEVI: 0 refills | Status: DC

## 2023-06-17 ENCOUNTER — Ambulatory Visit: Payer: Self-pay

## 2023-06-17 NOTE — Telephone Encounter (Signed)
 Pt has an appt Friday 5/2 with Dr Jari Merles.

## 2023-06-17 NOTE — Telephone Encounter (Signed)
 Copied from CRM [id number]. Topic: Clinical - Red Word Triage >> Jun 17, 2023  9:04 AM Ashley Vaughn F wrote: [facility] that prompted transfer to Nurse Triage:   Pain in feet; Lack of sleep due to the pain; swelling in feet has started and is making it difficult for her to move around.   Patient is requesting assistance in getting a wheelchair to help her with transporting herself around.  Chief Complaint: urinary s/s, foot swelling, foot pain Symptoms: bilateral foot swelling moderate to severe &  foot pain that increases at night to 7/10, SOB w/exertion, cloudy urine that is thick & gooey- like syrup,  Frequency: new on Pertinent Negatives: Patient denies chest pain, foot drainage, fever, bloody urine Disposition: [] ED /[] Urgent Care (no appt availability in office) / [x] Appointment(In office/virtual)/ []  [place] [facility]/ [] Home Care/ [] Refused Recommended Disposition /[] [facility]/ []  Follow-up with PCP Additional Notes: bilateral foot pain increases around 5 or 6 pm in evening  until about 8am in morning, pt states abd hurts when she needs to urinate & pt has to push urine out of bladder. Taking  800 mg tylenol  to help with pain. Pt would like assistance getting wheelchair due to unable to ambulate due to foot pain, swelling and SOB w/exertion. Pt needed appt scheduled on Friday due to transportation & money requirements: appt scheduled;ed  Reason for Disposition  Urination is difficult to start (i.e., hesitancy) or straining  [1] MILD swelling of both ankles (i.e., pedal edema) AND [2] new-onset or worsening  [1] MODERATE pain (e.g., interferes with normal activities, limping) AND [2] present > 3 days  Answer Assessment - Initial Assessment Questions 1. ONSET: "When did the pain start?"      Ongoing  2. LOCATION: "Where is the pain located?"      Bilateral foot 3. PAIN: "How bad is the pain?"    (Scale 1-10; or mild, moderate, severe)  - MILD (1-3): doesn't interfere with  normal activities.   - MODERATE (4-7): interferes with normal activities (e.g., work or school) or awakens from sleep, limping.   - SEVERE (8-10): excruciating pain, unable to do any normal activities, unable to walk.      Moderate and severe 4. WORK OR EXERCISE: "Has there been any recent work or exercise that involved this part of the body?"      no 5. CAUSE: "What do you think is causing the foot pain?"     swelling 6. OTHER SYMPTOMS: "Do you have any other symptoms?" (e.g., leg pain, rash, fever, numbness)     Pain, numbness and tingling = in bilateral feet 7. PREGNANCY: "Is there any chance you are pregnant?" "When was your last menstrual period?"     N/a  Answer Assessment - Initial Assessment Questions 1. ONSET: "When did the swelling start?" (e.g., minutes, hours, days)     X couple of weeks 2. LOCATION: "What part of the leg is swollen?"  "Are both legs swollen or just one leg?"     Bilateral feet 3. SEVERITY: "How bad is the swelling?" (e.g., localized; mild, moderate, severe)   - Localized: Small area of swelling localized to one leg.   - MILD pedal edema: Swelling limited to foot and ankle, pitting edema < 1/4 inch (6 mm) deep, rest and elevation eliminate most or all swelling.   - MODERATE edema: Swelling of lower leg to knee, pitting edema > 1/4 inch (6 mm) deep, rest and elevation only partially reduce swelling.   -  SEVERE edema: Swelling extends above knee, facial or hand swelling present.      severe 4. REDNESS: "Does the swelling look red or infected?"     no 5. PAIN: "Is the swelling painful to touch?" If Yes, ask: "How painful is it?"   (Scale 1-10; mild, moderate or severe)     Moderate to severe 6. FEVER: "Do you have a fever?" If Yes, ask: "What is it, how was it measured, and when did it start?"      no 7. CAUSE: "What do you think is causing the leg swelling?"     N/a 8. MEDICAL HISTORY: "Do you have a history of blood clots (e.g., DVT), cancer, heart failure,  kidney disease, or liver failure?"     CHF 9. RECURRENT SYMPTOM: "Have you had leg swelling before?" If Yes, ask: "When was the last time?" "What happened that time?"     no 10. OTHER SYMPTOMS: "Do you have any other symptoms?" (e.g., chest pain, difficulty breathing)       Sob w/ exeerction 11. PREGNANCY: "Is there any chance you are pregnant?" "When was your last menstrual period?"       N/a  Answer Assessment - Initial Assessment Questions 1. SYMPTOM: "What's the main symptom you're concerned about?" (e.g., frequency, incontinence)     Urinary stream difficulty - have to force urine out, urine gooey/thick, cloudy 2. ONSET: "When did the    start?"     X couple weeks 3. PAIN: "Is there any pain?" If Yes, ask: "How bad is it?" (Scale: 1-10; mild, moderate, severe)     no 4. CAUSE: "What do you think is causing the symptoms?"     unknown 5. OTHER SYMPTOMS: "Do you have any other symptoms?" (e.g., blood in urine, fever, flank pain, pain with urination)     Low abd pain when urge to urinate 6 to 7/10 6. PREGNANCY: "Is there any chance you are pregnant?" "When was your last menstrual period?"     N/a  Protocols used: Foot Pain-A-AH, Leg Swelling and Edema-A-AH, Urinary Symptoms-A-AH

## 2023-06-20 ENCOUNTER — Other Ambulatory Visit: Payer: Self-pay | Admitting: Infectious Diseases

## 2023-06-20 ENCOUNTER — Ambulatory Visit: Payer: Self-pay | Admitting: Student

## 2023-06-20 ENCOUNTER — Other Ambulatory Visit (HOSPITAL_COMMUNITY): Payer: Self-pay | Admitting: Cardiology

## 2023-06-20 ENCOUNTER — Other Ambulatory Visit: Payer: Self-pay | Admitting: Student

## 2023-06-20 DIAGNOSIS — B20 Human immunodeficiency virus [HIV] disease: Secondary | ICD-10-CM

## 2023-06-20 DIAGNOSIS — E1140 Type 2 diabetes mellitus with diabetic neuropathy, unspecified: Secondary | ICD-10-CM

## 2023-06-23 ENCOUNTER — Telehealth: Payer: Self-pay | Admitting: *Deleted

## 2023-06-23 NOTE — Telephone Encounter (Signed)
 System shows appointment canceled/ called patient LVM for patient to call the breast center to get appointment rescheduled, @ [phone].

## 2023-06-26 ENCOUNTER — Telehealth: Payer: Self-pay

## 2023-06-26 NOTE — Telephone Encounter (Signed)
 Ashley Vaughn (Key: Luann Rundle) Ozempic  (0.25 or 0.5 MG/DOSE) 2MG /3ML pen-injectors Form [employer] Electronic Prior Authorization Form Created Sent to Plan Plan Response Submit Clinical Questions Determination Favorable Message from Plan Approved. OZEMPIC  (0.25 OR 0.5 MG/DOSE) 2MG /3ML Soln Pen-inj is approved from 06/26/2023 to 06/25/2024. All strengths of the drug are approved.. Authorization Expiration Date: Jun 25, 2024.

## 2023-06-26 NOTE — Telephone Encounter (Signed)
 Prior Authorization for patient (Ozempic  (0.25 or 0.5 MG/DOSE) 2MG /3ML pen-injectors) came through on cover my meds was submitted with last office notes and labs awaiting approval or denial.  KEY: [id number]

## 2023-07-17 ENCOUNTER — Other Ambulatory Visit: Payer: Self-pay

## 2023-07-17 MED ORDER — ROSUVASTATIN CALCIUM 10 MG PO TABS: 10.0000 mg | ORAL_TABLET | Freq: Every day | ORAL | 10 refills | Status: DC

## 2023-07-17 NOTE — Telephone Encounter (Signed)
 Medication sent to pharmacy

## 2023-07-18 NOTE — Progress Notes (Signed)
 The ASCVD Risk score (Arnett DK, et al., 2019) failed to calculate for the following reasons:   Risk score cannot be calculated because patient has a medical history suggesting prior/existing ASCVD  Arlon Bergamo, BSN, RN

## 2023-07-28 ENCOUNTER — Other Ambulatory Visit: Payer: Self-pay | Admitting: Student

## 2023-07-28 NOTE — Telephone Encounter (Signed)
 Medication sent to pharmacy

## 2023-08-11 ENCOUNTER — Telehealth: Payer: Self-pay | Admitting: *Deleted

## 2023-08-11 ENCOUNTER — Ambulatory Visit: Payer: Self-pay

## 2023-08-11 NOTE — Telephone Encounter (Signed)
 Please call pt back at [phone] to schedule appt.  FYI Only or Action Required?: Action required by provider: request for appointment.  Patient was last seen in primary care on 04/15/2023 by Renne Homans, MD. Called Nurse Triage reporting Foot Pain and urinary s/s. Symptoms began several months ago. Interventions attempted: Nothing. Symptoms are: gradually worsening.  Triage Disposition: See PCP When Office is Open (Within 3 Days), See PCP Within 2 Weeks  Patient/caregiver understands and will follow disposition?: Yes, will follow disposition  Copied from CRM [id number]. Topic: Clinical - Red Word Triage >> Aug 11, 2023 11:29 AM Rosaria A wrote: Red Word that prompted transfer to Nurse Triage: Feet swelling off and on. Cloudy urine, patient states it looks like syrup. Reason for Disposition  All other urine symptoms  [1] MODERATE pain (e.g., interferes with normal activities, limping) AND [2] present > 3 days  Answer Assessment - Initial Assessment Questions 1. ONSET: When did the pain start?      ongoing 2. LOCATION: Where is the pain located?      bilaterally 3. PAIN: How bad is the pain?    (Scale 1-10; or mild, moderate, severe)  - MILD (1-3): doesn't interfere with normal activities.   - MODERATE (4-7): interferes with normal activities (e.g., work or school) or awakens from sleep, limping.   - SEVERE (8-10): excruciating pain, unable to do any normal activities, unable to walk.      Numbness, 3-4  4. WORK OR EXERCISE: Has there been any recent work or exercise that involved this part of the body?      denies 5. CAUSE: What do you think is causing the foot pain?     Unsure, usually swelling to the feet but it has gone down and I would like to know why 6. OTHER SYMPTOMS: Do you have any other symptoms? (e.g., leg pain, rash, fever, numbness)     Numbness to feet  Answer Assessment - Initial Assessment Questions 1. SYMPTOM: What's the main symptom you're  concerned about? (e.g., frequency, incontinence)     Urine is like syrup 2. ONSET: When did the  syrup urine  start?     Ongoing for about a year 3. PAIN: Is there any pain? If Yes, ask: How bad is it? (Scale: 1-10; mild, moderate, severe)     denies 4. CAUSE: What do you think is causing the symptoms?    Unsure, never dx with anything 5. OTHER SYMPTOMS: Do you have any other symptoms? (e.g., blood in urine, fever, flank pain, pain with urination)     denies  Protocols used: Foot Pain-A-AH, Urinary Symptoms-A-AH Routing HP to clinic for scheduling.

## 2023-08-11 NOTE — Telephone Encounter (Signed)
 I attempted to call the patient x2. Unable to reach the patient, unable to lvm.

## 2023-08-11 NOTE — Telephone Encounter (Signed)
 Attempt #4 unsuccessful.

## 2023-08-11 NOTE — Telephone Encounter (Signed)
 Attempt #3, unable to reach the patient, unable to lvm.

## 2023-08-11 NOTE — Telephone Encounter (Signed)
 Called patient x2 someone answers the phone then hangs up the phone. Call was regarding scheduling her mammogram.

## 2023-08-26 ENCOUNTER — Other Ambulatory Visit: Payer: Self-pay

## 2023-08-26 ENCOUNTER — Other Ambulatory Visit (HOSPITAL_COMMUNITY): Payer: Self-pay | Admitting: Cardiology

## 2023-08-26 ENCOUNTER — Telehealth: Payer: Self-pay | Admitting: *Deleted

## 2023-08-26 DIAGNOSIS — I5020 Unspecified systolic (congestive) heart failure: Secondary | ICD-10-CM

## 2023-08-26 DIAGNOSIS — E1140 Type 2 diabetes mellitus with diabetic neuropathy, unspecified: Secondary | ICD-10-CM

## 2023-08-26 NOTE — Telephone Encounter (Signed)
 Lvm for patient to return call to get mammogram scheduled/ or patient can call the breast center at [phone] to make her own appointment.

## 2023-08-27 ENCOUNTER — Encounter: Payer: Self-pay | Admitting: Student

## 2023-08-27 ENCOUNTER — Telehealth: Payer: Self-pay | Admitting: *Deleted

## 2023-08-27 NOTE — Telephone Encounter (Signed)
 Letter regarding mammogram mailed to patient / patient needs to contact the breast center to schedule appointment.

## 2023-08-28 MED ORDER — SYNJARDY 12.5-1000 MG PO TABS: 1.0000 | ORAL_TABLET | Freq: Two times a day (BID) | ORAL | 0 refills | Status: DC

## 2023-09-11 ENCOUNTER — Telehealth: Payer: Self-pay

## 2023-09-11 DIAGNOSIS — E1140 Type 2 diabetes mellitus with diabetic neuropathy, unspecified: Secondary | ICD-10-CM

## 2023-09-11 NOTE — Telephone Encounter (Signed)
 Pharmacy is requesting a new rx for Accu-Chek Guide test strips. Unable to attach medication.  [facility] - [place], [place] - [address]

## 2023-09-12 MED ORDER — BLOOD GLUCOSE TEST VI STRP: 1.0000 | ORAL_STRIP | Freq: Three times a day (TID) | 0 refills | Status: DC

## 2023-09-12 NOTE — Telephone Encounter (Signed)
 Meds ordered this encounter  Medications   Glucose Blood (BLOOD GLUCOSE TEST STRIPS) STRP    Sig: 1 each by In Vitro route 3 (three) times daily with meals. May substitute to any manufacturer covered by patient's insurance.    Dispense:  100 strip    Refill:  0   Accu-chek Guide strips not available, sent generic for filling at [facility].  Ozell Kung MD 09/12/2023, 7:04 AM

## 2023-09-26 ENCOUNTER — Other Ambulatory Visit (HOSPITAL_COMMUNITY): Payer: Self-pay | Admitting: Cardiology

## 2023-09-26 ENCOUNTER — Other Ambulatory Visit: Payer: Self-pay | Admitting: Student

## 2023-09-26 DIAGNOSIS — E1140 Type 2 diabetes mellitus with diabetic neuropathy, unspecified: Secondary | ICD-10-CM

## 2023-09-26 DIAGNOSIS — I5020 Unspecified systolic (congestive) heart failure: Secondary | ICD-10-CM

## 2023-09-29 NOTE — Telephone Encounter (Signed)
 Medication sent to pharmacy

## 2023-10-31 ENCOUNTER — Other Ambulatory Visit: Payer: Self-pay | Admitting: Student

## 2023-10-31 DIAGNOSIS — E1140 Type 2 diabetes mellitus with diabetic neuropathy, unspecified: Secondary | ICD-10-CM

## 2023-10-31 MED ORDER — BLOOD GLUCOSE TEST VI STRP: 1.0000 | ORAL_STRIP | Freq: Three times a day (TID) | 0 refills | Status: DC

## 2023-10-31 NOTE — Telephone Encounter (Signed)
 Copied from CRM [id number]. Topic: Clinical - Medication Refill >> Oct 31, 2023 11:17 AM Carrielelia G wrote: Medication: Glucose Blood (BLOOD GLUCOSE TEST STRIPS) STRP  Has the patient contacted their pharmacy? No (Agent: If no, request that the patient contact the pharmacy for the refill. If patient does not wish to contact the pharmacy document the reason why and proceed with request.) (Agent: If yes, when and what did the pharmacy advise?)  This is the patient's preferred pharmacy:  [facility] - [place], [place] - [address] [facility] [address] [place] [address] Phone: [phone] Fax: [phone]   Is this the correct pharmacy for this prescription? Yes If no, delete pharmacy and type the correct one.    Is the patient out of the medication? yes  Has the patient been seen for an appointment in the last year OR does the patient have an upcoming appointment? Yes  Can we respond through MyChart? No  Agent: Please be advised that Rx refills may take up to 3 business days. We ask that you follow-up with your pharmacy.

## 2023-11-05 ENCOUNTER — Encounter: Payer: Self-pay | Admitting: Student

## 2023-11-06 ENCOUNTER — Ambulatory Visit: Payer: MEDICAID | Admitting: Infectious Diseases

## 2023-11-28 ENCOUNTER — Ambulatory Visit: Payer: MEDICAID

## 2023-12-04 ENCOUNTER — Encounter: Payer: Self-pay | Admitting: Student

## 2023-12-04 ENCOUNTER — Other Ambulatory Visit: Payer: Self-pay

## 2023-12-04 ENCOUNTER — Ambulatory Visit (INDEPENDENT_AMBULATORY_CARE_PROVIDER_SITE_OTHER): Payer: Self-pay | Admitting: Student

## 2023-12-04 VITALS — BP 158/98 | HR 85 | Temp 97.8°F | Ht 62.0 in

## 2023-12-04 DIAGNOSIS — B20 Human immunodeficiency virus [HIV] disease: Secondary | ICD-10-CM

## 2023-12-04 DIAGNOSIS — I5020 Unspecified systolic (congestive) heart failure: Secondary | ICD-10-CM

## 2023-12-04 DIAGNOSIS — Z91128 Patient's intentional underdosing of medication regimen for other reason: Secondary | ICD-10-CM | POA: Diagnosis not present

## 2023-12-04 DIAGNOSIS — Z139 Encounter for screening, unspecified: Secondary | ICD-10-CM

## 2023-12-04 DIAGNOSIS — E1140 Type 2 diabetes mellitus with diabetic neuropathy, unspecified: Secondary | ICD-10-CM

## 2023-12-04 LAB — GLUCOSE, CAPILLARY: Glucose-Capillary: 353 mg/dL — ABNORMAL HIGH (ref 70–99)

## 2023-12-04 LAB — POCT GLYCOSYLATED HEMOGLOBIN (HGB A1C): HbA1c POC (<> result, manual entry): >14 % — AB (ref 4.0–5.6)

## 2023-12-04 MED ORDER — INSULIN GLARGINE-YFGN 100 UNIT/ML ~~LOC~~ SOLN: 6.0000 [IU] | Freq: Every day | SUBCUTANEOUS | 11 refills | Status: DC

## 2023-12-04 MED ORDER — SACUBITRIL-VALSARTAN 24-26 MG PO TABS: 1.0000 | ORAL_TABLET | Freq: Two times a day (BID) | ORAL | 0 refills | Status: DC

## 2023-12-04 MED ORDER — DEXCOM G7 SENSOR MISC
5 refills | Status: DC
Start: 2023-12-04 — End: 2024-01-05

## 2023-12-04 MED ORDER — ACCU-CHEK SOFTCLIX LANCETS MISC
3 refills | Status: DC
Start: 2023-12-04 — End: 2023-12-26

## 2023-12-04 MED ORDER — BIKTARVY 50-200-25 MG PO TABS: 1.0000 | ORAL_TABLET | Freq: Every day | ORAL | 5 refills | Status: DC

## 2023-12-04 MED ORDER — DULOXETINE HCL 60 MG PO CPEP: 60.0000 mg | ORAL_CAPSULE | Freq: Every day | ORAL | 0 refills | Status: DC

## 2023-12-04 MED ORDER — ACCU-CHEK GUIDE W/DEVICE KIT
PACK | 0 refills | Status: DC
Start: 2023-12-04 — End: 2023-12-26

## 2023-12-04 MED ORDER — SEMAGLUTIDE(0.25 OR 0.5MG/DOS) 2 MG/3ML ~~LOC~~ SOPN: 0.2500 mg | PEN_INJECTOR | SUBCUTANEOUS | 3 refills | Status: AC

## 2023-12-04 MED ORDER — SYNJARDY 12.5-1000 MG PO TABS: 1.0000 | ORAL_TABLET | Freq: Two times a day (BID) | ORAL | 0 refills | Status: DC

## 2023-12-04 MED ORDER — BLOOD GLUCOSE TEST VI STRP: 1.0000 | ORAL_STRIP | Freq: Three times a day (TID) | 0 refills | Status: DC

## 2023-12-04 MED ORDER — DEXCOM G7 RECEIVER DEVI: 0 refills | Status: DC

## 2023-12-04 MED ORDER — CARVEDILOL 3.125 MG PO TABS: 3.1250 mg | ORAL_TABLET | Freq: Two times a day (BID) | ORAL | 3 refills | Status: AC

## 2023-12-04 MED ORDER — ROSUVASTATIN CALCIUM 10 MG PO TABS: 10.0000 mg | ORAL_TABLET | Freq: Every day | ORAL | 10 refills | Status: AC

## 2023-12-04 NOTE — Progress Notes (Signed)
 Internal Medicine Clinic Attending  Case discussed with the resident at the time of the visit.  We reviewed the resident's history and exam and pertinent patient test results.  I agree with the assessment, diagnosis, and plan of care documented in the resident's note.

## 2023-12-04 NOTE — Progress Notes (Signed)
 CC: Chronic disease follow up  HPI:  Ashley Vaughn is a 58 y.o. female with a PMH stated below who presents today for evaluation. She shares that she is under stress and has not taken medicine this year.  Please see problem based assessment and plan for additional details.  Past Medical History:  Diagnosis Date   AKI (acute kidney injury) 08/24/2021   CVA (cerebral vascular accident) (HCC) 07/20/2021   Depression    Diabetes mellitus    Fistula    Heart failure (HCC)    HIV infection (HCC)    HSV (herpes simplex virus) anogenital infection 03/12/2019   Postoperative infection 07/18/2021   Patient had appendectomy early in May, followed by a hospitalization and drain placement for an intrabdominal infection and abscess.   Postprocedural intraabdominal abscess (HCC) 07/20/2021   Intra-abdominal abscess with fistulous communication between abscess cavity and cecum.   S/P laparoscopic appendectomy 06/22/2021   Seizure (HCC) 07/18/2021    Review of Systems: ROS negative except for what is noted on the assessment and plan.  Vitals:   12/04/23 0836  BP: (!) 158/98  Pulse: 85  Temp: 97.8 F (36.6 C)  TempSrc: Oral  SpO2: 99%  Height: 5' 2 (1.575 m)    Physical Exam: Constitutional: well-appearing woman in no acute distress Cardiovascular: regular rate and rhythm, no m/r/g Pulmonary/Chest: normal work of breathing on room air, lungs clear to auscultation bilaterally Abdominal: soft, non-tender, non-distended Neurological: alert & oriented x 3, no focal deficit Skin: warm and dry Psych: normal mood and behavior  Assessment & Plan:   Patient discussed with Dr. Jeanelle  Ashley Vaughn has many chronic diseases and has been without medicine or recent medical evaluation for the majority of this year. Assessment & Plan Poorly controlled type 2 diabetes mellitus with neuropathy (HCC) Not controlled.  A1c over 14.  Without medicine for the majority of this year.  Will  reinitiate her medicines. - This week, resume Semglee  6 daily, Synjardy  12.06-998 daily -Next week resume semaglutide  0.25 weekly and Cymbalta  60 daily Human immunodeficiency virus (HIV) disease (HCC) Controlled in the past but I suspect not under current control.  Has missed Biktarvy  as part of her general loss of medicine this year. -Resume Biktarvy  today HFrEF (heart failure with reduced ejection fraction) (HCC) Not in acute exacerbation.  Blood pressure is high.  Off of her medicines. -Resume Entresto  24-26 daily -Next week, resume Crestor  10 daily and Coreg  3.125 twice daily Encounter for screening involving social determinants of health (SDoH) Shares that the reason for being off medicine is from social stress.  She lives in a home with her grandchildren.  She states that finances come from them and from her disability benefits.  These factors cause her much stress.  As a result she stopped taking her medicine.  I am not sure if she has assistance from social work, difficult to clarify at this visit.  At times she has had to file police report given the nature of disagreements with her grandsons.  Reports that she struggles to get food and relies on sodas for drinks. - Referral for social work.  The reason would be for social stressors, housing instability, financial instability, food insecurity.  RTC in 2 to 3 weeks for reassessment.  To ensure that she has resumed her medicines, is able to get them, see any benefit was made from a social work referral.  Can consider basic blood work upon resumption of these medicines.  Can consider addressing  care gaps as able at that time. But much to do and further appointments likely necessary.  Ashley Vaughn, D.O. [facility] Internal Medicine, PGY-2 Phone: [phone] Date 12/04/2023 Time 10:22 AM

## 2023-12-04 NOTE — Assessment & Plan Note (Signed)
 Controlled in the past but I suspect not under current control.  Has missed Biktarvy  as part of her general loss of medicine this year. -Resume Biktarvy  today

## 2023-12-04 NOTE — Assessment & Plan Note (Addendum)
 Not in acute exacerbation.  Blood pressure is high.  Off of her medicines. -Resume Entresto  24-26 daily -Next week, resume Crestor  10 daily and Coreg  3.125 twice daily

## 2023-12-04 NOTE — Assessment & Plan Note (Signed)
 Not controlled.  A1c over 14.  Without medicine for the majority of this year.  Will reinitiate her medicines. - This week, resume Semglee  6 daily, Synjardy  12.06-998 daily -Next week resume semaglutide  0.25 weekly and Cymbalta  60 daily

## 2023-12-04 NOTE — Patient Instructions (Addendum)
 Let's restart your medicines.  I have ordered all of them to [facility].  Today: Resume Biktarvy , insulin  6 units daily, Entresto  (sacubitril), Synjardy  (empagliflozin ).  Next week: Resume semaglutide , Crestor  (rosuvastatin ), cymbalta , Coreg   Please expect a phone call from social work.  Please return in 1-2 weeks for a medicine appointment.

## 2023-12-05 LAB — MICROALBUMIN / CREATININE URINE RATIO
Creatinine, Urine: 101.1 mg/dL
Microalb/Creat Ratio: 2464 mg/g{creat} — ABNORMAL HIGH (ref 0–29)
Microalbumin, Urine: 2491 ug/mL

## 2023-12-08 ENCOUNTER — Telehealth: Payer: Self-pay | Admitting: *Deleted

## 2023-12-08 NOTE — Progress Notes (Unsigned)
 Complex Care Management Note Care Guide Note  12/08/2023 Name: Ashley Vaughn MRN: [id number] DOB: 1965/11/20   Complex Care Management Outreach Attempts: An unsuccessful telephone outreach was attempted today to offer the patient information about available complex care management services.  Follow Up Plan:  Additional outreach attempts will be made to offer the patient complex care management information and services.   Encounter Outcome:  No Answer  Harlene Satterfield  [facility]  [facility], [facility] Guide  Direct Dial: [phone]  Fax [phone]

## 2023-12-09 ENCOUNTER — Other Ambulatory Visit: Payer: Self-pay

## 2023-12-09 NOTE — Patient Instructions (Signed)
 [name] - I am sorry I was unable to reach you today for our scheduled appointment. I work with Norrine Sharper, MD and am calling to support your healthcare needs. Please contact me at [phone] at your earliest convenience. I look forward to speaking with you soon.   Thank you,  Orlean Fey, BSW [place]  [facility] Social Worker, [employer] [phone]

## 2023-12-09 NOTE — Progress Notes (Signed)
 Complex Care Management Note  Care Guide Note 12/09/2023 Name: Ashley Vaughn MRN: [id number] DOB: June 05, 1965  Ashley Vaughn is a 58 y.o. year old female who sees Norrine Sharper, MD for primary care. I reached out to [name] by phone today to offer complex care management services.  [name] was given information about Complex Care Management services today including:   The Complex Care Management services include support from the care team which includes your Nurse Care Manager, Clinical Social Worker, or Pharmacist.  The Complex Care Management team is here to help remove barriers to the health concerns and goals most important to you. Complex Care Management services are voluntary, and the patient may decline or stop services at any time by request to their care team member.   Complex Care Management Consent Status: Patient agreed to services and verbal consent obtained.   Follow up plan:  Telephone appointment with complex care management team member scheduled for:  10/21 with BSW and 10/31 with LCSW   Encounter Outcome:  Patient Scheduled  Harlene Satterfield  [facility]  [facility], [facility] Guide  Direct Dial: [phone]  Fax [phone]

## 2023-12-10 ENCOUNTER — Telehealth: Payer: Self-pay | Admitting: *Deleted

## 2023-12-10 NOTE — Progress Notes (Unsigned)
 Complex Care Management Care Guide Note  12/10/2023 Name: Ashley Vaughn MRN: [id number] DOB: 1965/11/26  Ashley Vaughn is a 58 y.o. year old female who is a primary care patient of Norrine Sharper, MD and is actively engaged with the care management team. I reached out to [name] by phone today to assist with re-scheduling  with the BSW.  Follow up plan: Unsuccessful telephone outreach attempt made. A HIPAA compliant phone message was left for the patient providing contact information and requesting a return call.  Harlene Satterfield  [facility]  [facility], [facility] Guide  Direct Dial: [phone]  Fax [phone]

## 2023-12-11 ENCOUNTER — Ambulatory Visit: Payer: Self-pay | Admitting: Student

## 2023-12-12 NOTE — Progress Notes (Signed)
 Complex Care Management Care Guide Note  12/12/2023 Name: Ashley Vaughn MRN: [id number] DOB: 1965/03/27  Ashley Vaughn is a 58 y.o. year old female who is a primary care patient of Norrine Sharper, MD and is actively engaged with the care management team. I reached out to [name] by phone today to assist with re-scheduling  with the BSW.  Follow up plan: Unsuccessful telephone outreach attempt made. A HIPAA compliant phone message was left for the patient providing contact information and requesting a return call. No further outreach attempts will be made at this time. We have been unable to contact the patient to reschedule for complex care management services.  Harlene Satterfield  [facility]  [facility], [facility] Guide  Direct Dial: [phone]  Fax [phone]

## 2023-12-18 ENCOUNTER — Ambulatory Visit: Admitting: Student

## 2023-12-18 ENCOUNTER — Encounter: Payer: Self-pay | Admitting: Student

## 2023-12-18 ENCOUNTER — Telehealth: Payer: Self-pay

## 2023-12-18 VITALS — BP 112/55 | HR 74 | Temp 97.9°F | Ht 62.0 in | Wt 148.2 lb

## 2023-12-18 DIAGNOSIS — Z7985 Long-term (current) use of injectable non-insulin antidiabetic drugs: Secondary | ICD-10-CM

## 2023-12-18 DIAGNOSIS — E1140 Type 2 diabetes mellitus with diabetic neuropathy, unspecified: Secondary | ICD-10-CM | POA: Diagnosis not present

## 2023-12-18 DIAGNOSIS — I951 Orthostatic hypotension: Secondary | ICD-10-CM | POA: Diagnosis present

## 2023-12-18 DIAGNOSIS — Z79899 Other long term (current) drug therapy: Secondary | ICD-10-CM | POA: Diagnosis not present

## 2023-12-18 LAB — GLUCOSE, CAPILLARY: Glucose-Capillary: 310 mg/dL — ABNORMAL HIGH (ref 70–99)

## 2023-12-18 NOTE — Assessment & Plan Note (Signed)
 Patient endorses taking her Synjardy  daily and Cymbalta  daily for neuropathy. Patient has not been taking insulin  due to not being able to properly administer to herself. Took Ozempic  last week but has not taken it yet this week. CBG today 310.    Plan:  -Stopping Synjardy  due to concern for orthostatic hypotension  -Referral to Nutrition and Diabetes Service for education and help with insulin  administration -AMB Referral [facility] Care Management - Pharmacist Carylon Baseman) for med assistance/adherence -F/u in one week for diabetes management

## 2023-12-18 NOTE — Progress Notes (Signed)
 This is a Psychologist, Occupational Note.  The care of the patient was discussed with Dr. Elicia and the assessment and plan was formulated with their assistance.  Please see their note for official documentation of the patient encounter.   Subjective:   Patient ID: Ashley Vaughn female   DOB: [birth year] 58 y.o.   MRN: [id number]  HPI: [name] is a 58 y.o. female with past medical history of HTN, HFrEF, T2DM with neuropathy, and HIV who presents to the clinic today for dizziness. Patient says she got dizzy for about 10-15 minuets at the bus stop this morning to the point where she fell down. She felt like world was spinning around her, but she did not lose consciousness and did not have any lightheadedness or SOB. Patient endorses not eating anything this morning. Last time she took any meds was last night, and she endorses generally feeling a bit woozy after taking them. Did not have any other dizziness episodes like this morning. Denies chest pain, headaches, vision changes, nausea, or vomiting. Of note, patient last seen on the 10/15 where her medicines were all restarted after not taking them for the past 5-6 months.   Of note, patient endorses feeling like she has been urinating more for the past couple of weeks. Reports that this started before restarting her medications. Denies dysuria, says urine has been cloudy off and on. Also endorses that stool has been loose for the past one or two days, but does not feel like she has been losing a lot of volume in her stool.   Patient also states financially instability. She cares for her grandchildren. At times, there are concerns for food insecurity too.   Review of Systems: In addition to HPI, no headache, vision changes, nausea, vomiting.  Past Medical History:  Diagnosis Date   AKI (acute kidney injury) 08/24/2021   CVA (cerebral vascular accident) (HCC) 07/20/2021   Depression    Diabetes mellitus    Fistula    Heart failure (HCC)    HIV  infection (HCC)    HSV (herpes simplex virus) anogenital infection 03/12/2019   Postoperative infection 07/18/2021   Patient had appendectomy early in May, followed by a hospitalization and drain placement for an intrabdominal infection and abscess.   Postprocedural intraabdominal abscess (HCC) 07/20/2021   Intra-abdominal abscess with fistulous communication between abscess cavity and cecum.   S/P laparoscopic appendectomy 06/22/2021   Seizure (HCC) 07/18/2021   Current Outpatient Medications  Medication Sig Dispense Refill   Accu-Chek Softclix Lancets lancets CHECK BLOOD SUGAR THREE TIMES PER DAY 270 each 3   bictegravir-emtricitabine -tenofovir  AF (BIKTARVY ) 50-200-25 MG TABS tablet Take 1 tablet by mouth daily. 30 tablet 5   Blood Glucose Monitoring Suppl (ACCU-CHEK GUIDE) w/Device KIT To monitor blood sugar, use in the morning before first meal of the day. 1 kit 0   carvedilol  (COREG ) 3.125 MG tablet Take 1 tablet (3.125 mg total) by mouth 2 (two) times daily with a meal. 60 tablet 3   Continuous Glucose Receiver (DEXCOM G7 RECEIVER) DEVI For glucose monitoring 1 each 0   Continuous Glucose Sensor (DEXCOM G7 SENSOR) MISC For glucose monitoring, replace every 2 weeks. 4 each 5   Continuous Glucose Sensor (FREESTYLE LIBRE 3 SENSOR) MISC Place 1 sensor on the skin every 14 days. Use to check glucose continuously 2 each 3   DULoxetine  (CYMBALTA ) 60 MG capsule Take 1 capsule (60 mg total) by mouth daily. 30 capsule 0   Empagliflozin -metFORMIN  HCl (  SYNJARDY ) 12.06-998 MG TABS Take 1 tablet by mouth in the morning and at bedtime. 60 tablet 0   Ensure (ENSURE) Take 237 mLs by mouth 2 (two) times daily between meals. 237 mL 12   fluconazole  (DIFLUCAN ) 150 MG tablet Take 1 tablet (150 mg total) by mouth daily. 3 tablet 0   Glucose Blood (BLOOD GLUCOSE TEST STRIPS) STRP 1 each by In Vitro route 3 (three) times daily with meals. May substitute to any manufacturer covered by patient's insurance. 100  strip 0   insulin  glargine-yfgn (SEMGLEE , YFGN,) 100 UNIT/ML injection Inject 0.06 mLs (6 Units total) into the skin daily. 10 mL 11   Insulin  Pen Needle (PEN NEEDLES 3/16) 31G X 5 MM MISC Use once to inject insulin  100 each 3   leptospermum manuka honey (MEDIHONEY) PSTE paste Apply 1 Application topically daily. Apply once daily to sacral wound with gauze over top 44 mL 0   rosuvastatin  (CRESTOR ) 10 MG tablet Take 1 tablet (10 mg total) by mouth daily. 30 tablet 10   sacubitril-valsartan (ENTRESTO ) 24-26 MG Take 1 tablet by mouth 2 (two) times daily. 60 tablet 0   Semaglutide ,0.25 or 0.5MG /DOS, 2 MG/3ML SOPN Inject 0.25 mg into the skin once a week. 3 mL 3   No current facility-administered medications for this visit.   Social History   Socioeconomic History   Marital status: Legally Separated    Spouse name: Not on file   Number of children: Not on file   Years of education: Not on file   Highest education level: Not on file  Occupational History   Not on file  Tobacco Use   Smoking status: Never    Passive exposure: Current   Smokeless tobacco: Never  Vaping Use   Vaping status: Never Used  Substance and Sexual Activity   Alcohol use: Not Currently    Comment: occasional   Drug use: No    Comment: h/o cocaine abuse, clean since 2009   Sexual activity: Never    Partners: Male    Comment: pt. declined condoms  Other Topics Concern   Not on file  Social History Narrative   Not on file   Social Drivers of Health   Financial Resource Strain: High Risk (11/01/2021)   Overall Financial Resource Strain (CARDIA)    Difficulty of Paying Living Expenses: Very hard  Food Insecurity: Food Insecurity Present (12/22/2022)   Hunger Vital Sign    Worried About Running Out of Food in the Last Year: Sometimes true    Ran Out of Food in the Last Year: Sometimes true  Transportation Needs: No Transportation Needs (12/22/2022)   PRAPARE - Administrator, Civil Service  (Medical): No    Lack of Transportation (Non-Medical): No  Physical Activity: Not on file  Stress: Not on file  Social Connections: Socially Isolated (01/02/2022)   Social Connection and Isolation Panel    Frequency of Communication with Friends and Family: Once a week    Frequency of Social Gatherings with Friends and Family: Once a week    Attends Religious Services: Never    Database Administrator or Organizations: No    Attends Banker Meetings: Never    Marital Status: Separated      Objective:    Vitals:   12/18/23 0815 12/18/23 0818 12/18/23 0820 12/18/23 0824  BP: (!) 156/69 (!) 148/74 (!) 143/72 (!) 112/55  Pulse: 78 67 71 74  Temp: 97.9 F (36.6 C)  TempSrc: Oral     SpO2: 100%     Weight: 148 lb 3.2 oz (67.2 kg)     Height: 5' 2 (1.575 m)      Initial Orthostatic Vitals:  Laying: 148/74, pulse 67 Sitting: 143/72, pulse 71 Standing: 112/55, pulse 74  Repeat Orthostatic Vitals:  Laying: 179/99, pulse 80 Sitting: 170/98, pulse 82 Standing, 0 minutes: 167/82, pulse 77 Standing, 3 minutes: 169/85, pulse 68  Physical Exam Constitutional:      General: She is not in acute distress.    Appearance: She is not ill-appearing.  HENT:     Head: Normocephalic and atraumatic.  Eyes:     Extraocular Movements: Extraocular movements intact.     Right eye: No nystagmus.     Left eye: No nystagmus.  Cardiovascular:     Rate and Rhythm: Normal rate and regular rhythm.  Pulmonary:     Effort: Pulmonary effort is normal.     Breath sounds: Normal breath sounds.  Skin:    General: Skin is warm and dry.  Neurological:     General: No focal deficit present.     Mental Status: She is alert and oriented to person, place, and time.     Cranial Nerves: No cranial nerve deficit or facial asymmetry.     Motor: Motor function is intact.  Psychiatric:        Mood and Affect: Mood is anxious.    Assessment & Plan:   Assessment & Plan Orthostatic  hypotension Patient endorsed a dizziness episode this morning (10/30) to the point where she fell down. Dizziness lasted about 10-15 minutes. Felt like environment was spinning, denies LOC, lightheadedness, SOB, or chest pain. Endorses not eating this morning, and not eating much in general. Generally drinks 2 x 16 fluid ounce bottles of water a day. Of note, all of patient's medications have been started about 2 weeks ago, including Synjardy , Entresto , and Carvedilol . Cardiovascular exam unremarkable and no nystagmus present. Remainder of neuro exam without focal deficits noted. Orthostatic vitals positive but improved after bottle of water. No hypoglycemia, CBG this morning 310. Patient currently is not experiencing dizziness. Comfortable going home with close follow up in 1 week. Return precautions discussed with patient.    Plan:  -Encourage patient to stay adequately hydrated at home  -Stopping Synjardy , Entresto , Carvedilol  until next appointment in one week (she is certain that she is taking Synjardy  but unsure if pill packs have Entresto  or Carvedilol  in them) -CBC to check blood counts -BMP to check kidneys and electrolytes  -Referral with Dr. Brinda to go over medication management  -F/u in one week  Poorly controlled type 2 diabetes mellitus with neuropathy [facility]) Patient endorses taking her Synjardy  daily and Cymbalta  daily for neuropathy. Patient has not been taking insulin  due to not being able to properly administer to herself. Took Ozempic  last week but has not taken it yet this week. CBG today 310.    Plan:  -Stopping Synjardy  due to concern for orthostatic hypotension  -Referral to Nutrition and Diabetes Service for education and help with insulin  administration -AMB Referral [facility] Care Management - Pharmacist Carylon Brinda) for med assistance/adherence -F/u in one week for diabetes management   Return in about 1 week (around 12/25/2023) for follow up, blood pressure, diabetes.    Discussed and evaluated patient with Dr. Elicia. Discussed patient with Dr. Francesco.   Cozetta Pereyra, MS3

## 2023-12-18 NOTE — Assessment & Plan Note (Deleted)
  Orders:   Referral to Nutrition and Diabetes Services   AMB Referral [facility] Care Management

## 2023-12-18 NOTE — Assessment & Plan Note (Deleted)
  Orders:   Basic metabolic panel with GFR   CBC no Diff

## 2023-12-18 NOTE — Patient Instructions (Addendum)
 Thank you, [name] for allowing us  to provide your care today. Today we discussed:  -Stay hydrated at home.  -STOP Synjardy , Entresto  and Carvedilol  until you see us  back in 1 week  -If you have worsening dizziness or weakness or new concerning symptoms (chest pain, shortness of breath, fevers, vomiting-not able to keep fluids down), please call our office or go to Urgent Care/Emergency Room.  -Blood work today, I will call with results.    Follow up: 1 week    Should you have any questions or concerns please call the internal medicine clinic at [phone].    Mayur Duman, D.O. [facility]

## 2023-12-18 NOTE — Telephone Encounter (Signed)
 Unable to reach pt to sch her an appt with Arland Hole, RD. I left detailed message to call the clinic back.

## 2023-12-18 NOTE — Progress Notes (Deleted)
 CC: ***  HPI: Ashley Vaughn is a 58 y.o. female living with a history stated below and presents today for ***. Please see problem based assessment and plan for additional details.  Past Medical History:  Diagnosis Date   AKI (acute kidney injury) 08/24/2021   CVA (cerebral vascular accident) (HCC) 07/20/2021   Depression    Diabetes mellitus    Fistula    Heart failure (HCC)    HIV infection (HCC)    HSV (herpes simplex virus) anogenital infection 03/12/2019   Postoperative infection 07/18/2021   Patient had appendectomy early in May, followed by a hospitalization and drain placement for an intrabdominal infection and abscess.   Postprocedural intraabdominal abscess (HCC) 07/20/2021   Intra-abdominal abscess with fistulous communication between abscess cavity and cecum.   S/P laparoscopic appendectomy 06/22/2021   Seizure (HCC) 07/18/2021    Current Outpatient Medications on File Prior to Visit  Medication Sig Dispense Refill   Accu-Chek Softclix Lancets lancets CHECK BLOOD SUGAR THREE TIMES PER DAY 270 each 3   bictegravir-emtricitabine -tenofovir  AF (BIKTARVY ) 50-200-25 MG TABS tablet Take 1 tablet by mouth daily. 30 tablet 5   Blood Glucose Monitoring Suppl (ACCU-CHEK GUIDE) w/Device KIT To monitor blood sugar, use in the morning before first meal of the day. 1 kit 0   carvedilol  (COREG ) 3.125 MG tablet Take 1 tablet (3.125 mg total) by mouth 2 (two) times daily with a meal. 60 tablet 3   Continuous Glucose Receiver (DEXCOM G7 RECEIVER) DEVI For glucose monitoring 1 each 0   Continuous Glucose Sensor (DEXCOM G7 SENSOR) MISC For glucose monitoring, replace every 2 weeks. 4 each 5   Continuous Glucose Sensor (FREESTYLE LIBRE 3 SENSOR) MISC Place 1 sensor on the skin every 14 days. Use to check glucose continuously 2 each 3   DULoxetine  (CYMBALTA ) 60 MG capsule Take 1 capsule (60 mg total) by mouth daily. 30 capsule 0   Empagliflozin -metFORMIN  HCl (SYNJARDY ) 12.06-998 MG TABS  Take 1 tablet by mouth in the morning and at bedtime. 60 tablet 0   Ensure (ENSURE) Take 237 mLs by mouth 2 (two) times daily between meals. 237 mL 12   fluconazole  (DIFLUCAN ) 150 MG tablet Take 1 tablet (150 mg total) by mouth daily. 3 tablet 0   Glucose Blood (BLOOD GLUCOSE TEST STRIPS) STRP 1 each by In Vitro route 3 (three) times daily with meals. May substitute to any manufacturer covered by patient's insurance. 100 strip 0   insulin  glargine-yfgn (SEMGLEE , YFGN,) 100 UNIT/ML injection Inject 0.06 mLs (6 Units total) into the skin daily. 10 mL 11   Insulin  Pen Needle (PEN NEEDLES 3/16) 31G X 5 MM MISC Use once to inject insulin  100 each 3   leptospermum manuka honey (MEDIHONEY) PSTE paste Apply 1 Application topically daily. Apply once daily to sacral wound with gauze over top 44 mL 0   rosuvastatin  (CRESTOR ) 10 MG tablet Take 1 tablet (10 mg total) by mouth daily. 30 tablet 10   sacubitril-valsartan (ENTRESTO ) 24-26 MG Take 1 tablet by mouth 2 (two) times daily. 60 tablet 0   Semaglutide ,0.25 or 0.5MG /DOS, 2 MG/3ML SOPN Inject 0.25 mg into the skin once a week. 3 mL 3   No current facility-administered medications on file prior to visit.    Family History  Problem Relation Age of Onset   Diabetes Mother    Hypertension Mother    Heart disease Father    Arthritis Father    Diabetes Maternal Uncle     Social  History   Socioeconomic History   Marital status: Legally Separated    Spouse name: Not on file   Number of children: Not on file   Years of education: Not on file   Highest education level: Not on file  Occupational History   Not on file  Tobacco Use   Smoking status: Never    Passive exposure: Current   Smokeless tobacco: Never  Vaping Use   Vaping status: Never Used  Substance and Sexual Activity   Alcohol use: Not Currently    Comment: occasional   Drug use: No    Comment: h/o cocaine abuse, clean since 2009   Sexual activity: Never    Partners: Male     Comment: pt. declined condoms  Other Topics Concern   Not on file  Social History Narrative   Not on file   Social Drivers of Health   Financial Resource Strain: High Risk (11/01/2021)   Overall Financial Resource Strain (CARDIA)    Difficulty of Paying Living Expenses: Very hard  Food Insecurity: Food Insecurity Present (12/22/2022)   Hunger Vital Sign    Worried About Running Out of Food in the Last Year: Sometimes true    Ran Out of Food in the Last Year: Sometimes true  Transportation Needs: No Transportation Needs (12/22/2022)   PRAPARE - Administrator, Civil Service (Medical): No    Lack of Transportation (Non-Medical): No  Physical Activity: Not on file  Stress: Not on file  Social Connections: Socially Isolated (01/02/2022)   Social Connection and Isolation Panel    Frequency of Communication with Friends and Family: Once a week    Frequency of Social Gatherings with Friends and Family: Once a week    Attends Religious Services: Never    Database Administrator or Organizations: No    Attends Banker Meetings: Never    Marital Status: Separated  Intimate Partner Violence: Unknown (12/22/2022)   Humiliation, Afraid, Rape, and Kick questionnaire    Fear of Current or Ex-Partner: No    Emotionally Abused: No    Physically Abused: No    Sexually Abused: Not on file    Review of Systems: ROS negative except for what is noted on the assessment and plan.  There were no vitals filed for this visit.  Physical Exam  Physical Exam: Constitutional: well-appearing *** sitting in ***, in no acute distress HENT: normocephalic atraumatic, mucous membranes moist Eyes: conjunctiva non-erythematous Cardiovascular: regular rate and rhythm, no m/r/g Pulmonary/Chest: normal work of breathing on room air, lungs clear to auscultation bilaterally Abdominal: soft, non-tender, non-distended MSK: *** Neurological: alert & oriented x 3, 5/5 strength in bilateral  upper and lower extremities, normal gait Skin: warm and dry Psych: ***  Assessment & Plan:   Assessment & Plan Poorly controlled type 2 diabetes mellitus with neuropathy (HCC)  Orders:   Referral to Nutrition and Diabetes Services   AMB Referral VBCI Care Management  Orthostatic hypotension  Orders:   Basic metabolic panel with GFR   CBC no Diff     No orders of the defined types were placed in this encounter.  PMH: HTN, HFrEF, T2DM with neuropathy, sacral decubitus ulcer, MCI, MDD, HIV on Biktarvy   Status: {statusupdate:33856}.Last A1c >14 this month.  A1c today is NOT DUE.  At LOV 10/16, restarted medications including Semglee  6 units daily, Synjardy  12.06-998 mg daily***w/ A1c that high***, Ozempic  0.25 mg weekly.  Monitor: ***. Patient *** hypoglycemia.  Per review of ***,  average blood glucose is***.  Average fasting blood glucose is***. Foot exam ***.   Plan -Continue *** -A1c in 3 months -Ophthalmology exam: *** -Urine ACR: 11/2023, elevated/worsening, resumed SGLT2 and ARNI -LDL 107 on 01/2023, restarted rosuvastatin  10 mg*** - Neuropathy: Cymbalta  60 mg daily  HIV Labs 04/2023 with undetectable and CD4 1176.  Uncertain of status at this time due to loss of medicines this year.  Has follow-up visit on 11/11.  HFrEF (EF ***) Restarted Entresto  24-26 mg daily.  Check if restarted carvedilol  3.125 mg twice daily. Last BMP 03/2023 with baseline creatinine and GFR 45 ***Suspect CKD 3***  Care: Mammogram, Pap smear, Tdap, foot, flu, ophthalmology, colon cancer screening No follow-ups on file.   Patient {GC/GE:[id number]::discussed with,seen with} Dr. {[id number]::Tpoopjfd,Z. Hoffman,Winfrey,Narendra,Chun,Chambliss,Lau,Machen}  Ozell Nearing, D.O. [facility] Internal Medicine, PGY-3 Clinic Phone: [phone] Date 12/18/2023 Time 7:34 AM

## 2023-12-18 NOTE — Progress Notes (Signed)
 This is a Psychologist, Occupational Note.  The care of the Ashley Vaughn was discussed with Dr. Elicia and the assessment and plan was formulated with their assistance.  Please see their note for official documentation of the Ashley Vaughn encounter.   Subjective:   Ashley Vaughn ID: Ashley Vaughn female   DOB: [birth year] 58 y.o.   MRN: [id number]  HPI: Ashley Vaughn is a 58 y.o. female with past medical history of HTN, HFrEF, T2DM with neuropathy, and HIV who presents to the clinic today for dizziness. Ashley Vaughn says Ashley Vaughn got dizzy for about 10-15 minuets at the bus stop this morning to the point where Ashley Vaughn fell down. Ashley Vaughn felt like world was spinning around her, but Ashley Vaughn did not lose consciousness and did not have any lightheadedness or SOB. Ashley Vaughn endorses not eating anything this morning. Last time Ashley Vaughn took any meds was last night, and Ashley Vaughn endorses generally feeling a bit woozy after taking them. Did not have any other dizziness episodes like this morning. Denies chest pain, headaches, vision changes, nausea, or vomiting. Of note, Ashley Vaughn last seen on the 10/15 where her medicines were all restarted after not taking them for the past 5-6 months.   Of note, Ashley Vaughn endorses feeling like Ashley Vaughn has been urinating more for the past couple of weeks. Reports that this started before restarting her medications. Denies dysuria, says urine has been cloudy off and on. Also endorses that stool has been loose for the past one or two days, but does not feel like Ashley Vaughn has been losing a lot of volume in her stool.   Ashley Vaughn also states financially instability. Ashley Vaughn cares for her grandchildren. At times, there are concerns for food insecurity too.   Review of Systems: In addition to HPI, no headache, vision changes, nausea, vomiting.  Past Medical History:  Diagnosis Date   AKI (acute kidney injury) 08/24/2021   CVA (cerebral vascular accident) (HCC) 07/20/2021   Depression    Diabetes mellitus    Fistula    Heart failure (HCC)    HIV  infection (HCC)    HSV (herpes simplex virus) anogenital infection 03/12/2019   Postoperative infection 07/18/2021   Ashley Vaughn had appendectomy early in May, followed by a hospitalization and drain placement for an intrabdominal infection and abscess.   Postprocedural intraabdominal abscess (HCC) 07/20/2021   Intra-abdominal abscess with fistulous communication between abscess cavity and cecum.   S/P laparoscopic appendectomy 06/22/2021   Seizure (HCC) 07/18/2021   Current Outpatient Medications  Medication Sig Dispense Refill   Accu-Chek Softclix Lancets lancets CHECK BLOOD SUGAR THREE TIMES PER DAY 270 each 3   bictegravir-emtricitabine -tenofovir  AF (BIKTARVY ) 50-200-25 MG TABS tablet Take 1 tablet by mouth daily. 30 tablet 5   Blood Glucose Monitoring Suppl (ACCU-CHEK GUIDE) w/Device KIT To monitor blood sugar, use in the morning before first meal of the day. 1 kit 0   carvedilol  (COREG ) 3.125 MG tablet Take 1 tablet (3.125 mg total) by mouth 2 (two) times daily with a meal. 60 tablet 3   Continuous Glucose Receiver (DEXCOM G7 RECEIVER) DEVI For glucose monitoring 1 each 0   Continuous Glucose Sensor (DEXCOM G7 SENSOR) MISC For glucose monitoring, replace every 2 weeks. 4 each 5   Continuous Glucose Sensor (FREESTYLE LIBRE 3 SENSOR) MISC Place 1 sensor on the skin every 14 days. Use to check glucose continuously 2 each 3   DULoxetine  (CYMBALTA ) 60 MG capsule Take 1 capsule (60 mg total) by mouth daily. 30 capsule 0   Empagliflozin -metFORMIN  HCl (  SYNJARDY ) 12.06-998 MG TABS Take 1 tablet by mouth in the morning and at bedtime. 60 tablet 0   Ensure (ENSURE) Take 237 mLs by mouth 2 (two) times daily between meals. 237 mL 12   fluconazole  (DIFLUCAN ) 150 MG tablet Take 1 tablet (150 mg total) by mouth daily. 3 tablet 0   Glucose Blood (BLOOD GLUCOSE TEST STRIPS) STRP 1 each by In Vitro route 3 (three) times daily with meals. May substitute to any manufacturer covered by Ashley Vaughn's insurance. 100  strip 0   insulin  glargine-yfgn (SEMGLEE , YFGN,) 100 UNIT/ML injection Inject 0.06 mLs (6 Units total) into the skin daily. 10 mL 11   Insulin  Pen Needle (PEN NEEDLES 3/16) 31G X 5 MM MISC Use once to inject insulin  100 each 3   leptospermum manuka honey (MEDIHONEY) PSTE paste Apply 1 Application topically daily. Apply once daily to sacral wound with gauze over top 44 mL 0   rosuvastatin  (CRESTOR ) 10 MG tablet Take 1 tablet (10 mg total) by mouth daily. 30 tablet 10   sacubitril-valsartan (ENTRESTO ) 24-26 MG Take 1 tablet by mouth 2 (two) times daily. 60 tablet 0   Semaglutide ,0.25 or 0.5MG /DOS, 2 MG/3ML SOPN Inject 0.25 mg into the skin once a week. 3 mL 3   No current facility-administered medications for this visit.   Social History   Socioeconomic History   Marital status: Legally Separated    Spouse name: Not on file   Number of children: Not on file   Years of education: Not on file   Highest education level: Not on file  Occupational History   Not on file  Tobacco Use   Smoking status: Never    Passive exposure: Current   Smokeless tobacco: Never  Vaping Use   Vaping status: Never Used  Substance and Sexual Activity   Alcohol use: Not Currently    Comment: occasional   Drug use: No    Comment: h/o cocaine abuse, clean since 2009   Sexual activity: Never    Partners: Male    Comment: pt. declined condoms  Other Topics Concern   Not on file  Social History Narrative   Not on file   Social Drivers of Health   Financial Resource Strain: High Risk (11/01/2021)   Overall Financial Resource Strain (CARDIA)    Difficulty of Paying Living Expenses: Very hard  Food Insecurity: Food Insecurity Present (12/22/2022)   Hunger Vital Sign    Worried About Running Out of Food in the Last Year: Sometimes true    Ran Out of Food in the Last Year: Sometimes true  Transportation Needs: No Transportation Needs (12/22/2022)   PRAPARE - Administrator, Civil Service  (Medical): No    Lack of Transportation (Non-Medical): No  Physical Activity: Not on file  Stress: Not on file  Social Connections: Socially Isolated (01/02/2022)   Social Connection and Isolation Panel    Frequency of Communication with Friends and Family: Once a week    Frequency of Social Gatherings with Friends and Family: Once a week    Attends Religious Services: Never    Database Administrator or Organizations: No    Attends Banker Meetings: Never    Marital Status: Separated      Objective:    Vitals:   12/18/23 0815 12/18/23 0818 12/18/23 0820 12/18/23 0824  BP: (!) 156/69 (!) 148/74 (!) 143/72 (!) 112/55  Pulse: 78 67 71 74  Temp: 97.9 F (36.6 C)  TempSrc: Oral     SpO2: 100%     Weight: 148 lb 3.2 oz (67.2 kg)     Height: 5' 2 (1.575 m)      Initial Orthostatic Vitals:  Laying: 148/74, pulse 67 Sitting: 143/72, pulse 71 Standing: 112/55, pulse 74  Repeat Orthostatic Vitals:  Laying: 179/99, pulse 80 Sitting: 170/98, pulse 82 Standing, 0 minutes: 167/82, pulse 77 Standing, 3 minutes: 169/85, pulse 68  Physical Exam Constitutional:      General: Ashley Vaughn is not in acute distress.    Appearance: Ashley Vaughn is not ill-appearing.  HENT:     Head: Normocephalic and atraumatic.  Eyes:     Extraocular Movements: Extraocular movements intact.     Right eye: No nystagmus.     Left eye: No nystagmus.  Cardiovascular:     Rate and Rhythm: Normal rate and regular rhythm.  Pulmonary:     Effort: Pulmonary effort is normal.     Breath sounds: Normal breath sounds.  Skin:    General: Skin is warm and dry.  Neurological:     General: No focal deficit present.     Mental Status: Ashley Vaughn is alert and oriented to person, place, and time.     Cranial Nerves: No cranial nerve deficit or facial asymmetry.     Motor: Motor function is intact.  Psychiatric:        Mood and Affect: Mood is anxious.    Assessment & Plan:   Assessment & Plan Orthostatic  hypotension Ashley Vaughn endorsed a dizziness episode this morning (10/30) to the point where Ashley Vaughn fell down. Dizziness lasted about 10-15 minutes. Felt like environment was spinning, denies LOC, lightheadedness, SOB, or chest pain. Endorses not eating this morning, and not eating much in general. Generally drinks 2 x 16 fluid ounce bottles of water a day. Of note, all of Ashley Vaughn's medications have been started about 2 weeks ago, including Synjardy , Entresto , and Carvedilol . Cardiovascular exam unremarkable and no nystagmus present. Remainder of neuro exam without focal deficits noted. Orthostatic vitals positive but improved after bottle of water. No hypoglycemia, CBG this morning 310. Ashley Vaughn currently is not experiencing dizziness. Comfortable going home with close follow up in 1 week. Return precautions discussed with Ashley Vaughn.    Plan:  -Encourage Ashley Vaughn to stay adequately hydrated at home  -Stopping Synjardy , Entresto , Carvedilol  until next appointment in one week (Ashley Vaughn is certain that Ashley Vaughn is taking Synjardy  but unsure if pill packs have Entresto  or Carvedilol  in them) -CBC to check blood counts -BMP to check kidneys and electrolytes  -Referral with Dr. Brinda to go over medication management  -F/u in one week  Poorly controlled type 2 diabetes mellitus with neuropathy [facility]) Ashley Vaughn endorses taking her Synjardy  daily and Cymbalta  daily for neuropathy. Ashley Vaughn has not been taking insulin  due to not being able to properly administer to herself. Took Ozempic  last week but has not taken it yet this week. CBG today 310.    Plan:  -Stopping Synjardy  due to concern for orthostatic hypotension  -Referral to Nutrition and Diabetes Service for education and help with insulin  administration -AMB Referral [facility] - Pharmacist Carylon Brinda) for med assistance/adherence -F/u in one week for diabetes management   Return in about 1 week (around 12/25/2023) for follow up, blood pressure, diabetes.    Discussed and evaluated Ashley Vaughn with Dr. Elicia. Discussed Ashley Vaughn with Dr. Francesco.   Cozetta Pereyra, MS3

## 2023-12-18 NOTE — Assessment & Plan Note (Signed)
 Patient endorsed a dizziness episode this morning (10/30) to the point where she fell down. Dizziness lasted about 10-15 minutes. Felt like environment was spinning, denies LOC, lightheadedness, SOB, or chest pain. Endorses not eating this morning, and not eating much in general. Generally drinks 2 x 16 fluid ounce bottles of water a day. Of note, all of patient's medications have been started about 2 weeks ago, including Synjardy , Entresto , and Carvedilol . Cardiovascular exam unremarkable and no nystagmus present. Remainder of neuro exam without focal deficits noted. Orthostatic vitals positive but improved after bottle of water. No hypoglycemia, CBG this morning 310. Patient currently is not experiencing dizziness. Comfortable going home with close follow up in 1 week. Return precautions discussed with patient.    Plan:  -Encourage patient to stay adequately hydrated at home  -Stopping Synjardy , Entresto , Carvedilol  until next appointment in one week (she is certain that she is taking Synjardy  but unsure if pill packs have Entresto  or Carvedilol  in them) -CBC to check blood counts -BMP to check kidneys and electrolytes  -Referral with Dr. Brinda to go over medication management  -F/u in one week

## 2023-12-18 NOTE — Assessment & Plan Note (Deleted)
 Patient endorses taking her Synjardy  daily and Cymbalta  daily for neuropathy. Patient has not been taking insulin  due to not being able to properly administer to herself. Took Ozempic  last week but has not taken it yet this week. CBG today 310.    Plan:  -Stopping Synjardy  due to concern for orthostatic hypotension  -Referral to Nutrition and Diabetes Service for education and help with insulin  administration -AMB Referral [facility] Care Management - Pharmacist Carylon Baseman) for med assistance/adherence -F/u in one week for diabetes management

## 2023-12-19 ENCOUNTER — Telehealth: Admitting: Licensed Clinical Social Worker

## 2023-12-20 LAB — BASIC METABOLIC PANEL WITH GFR
BUN/Creatinine Ratio: 15 (ref 9–23)
BUN: 29 mg/dL — AB (ref 6–24)
CO2: 17 mmol/L — AB (ref 20–29)
Calcium: 9.7 mg/dL (ref 8.7–10.2)
Chloride: 102 mmol/L (ref 96–106)
Creatinine, Ser: 1.89 mg/dL — AB (ref 0.57–1.00)
Glucose: 302 mg/dL — AB (ref 70–99)
Potassium: 4.1 mmol/L (ref 3.5–5.2)
Sodium: 136 mmol/L (ref 134–144)
eGFR: 31 mL/min/1.73 — AB (ref >59)

## 2023-12-20 LAB — CBC
Hematocrit: 36.6 % (ref 34.0–46.6)
Hemoglobin: 10.7 g/dL — ABNORMAL LOW (ref 11.1–15.9)
MCH: 21.1 pg — ABNORMAL LOW (ref 26.6–33.0)
MCHC: 29.2 g/dL — ABNORMAL LOW (ref 31.5–35.7)
MCV: 72 fL — ABNORMAL LOW (ref 79–97)
Platelets: 315 x10E3/uL (ref 150–450)
RBC: 5.07 x10E6/uL (ref 3.77–5.28)
RDW: 14.6 % (ref 11.7–15.4)
WBC: 6.9 x10E3/uL (ref 3.4–10.8)

## 2023-12-22 ENCOUNTER — Telehealth: Payer: Self-pay | Admitting: *Deleted

## 2023-12-22 ENCOUNTER — Ambulatory Visit: Payer: Self-pay | Admitting: Student

## 2023-12-22 DIAGNOSIS — D649 Anemia, unspecified: Secondary | ICD-10-CM

## 2023-12-22 DIAGNOSIS — E1140 Type 2 diabetes mellitus with diabetic neuropathy, unspecified: Secondary | ICD-10-CM

## 2023-12-22 NOTE — Progress Notes (Signed)
 Internal Medicine Clinic Attending  Case discussed with the resident at the time of the visit.  We reviewed the resident's history and exam and pertinent patient test results.  I agree with the assessment, diagnosis, and plan of care documented in the resident's note.

## 2023-12-22 NOTE — Telephone Encounter (Signed)
 Copied from CRM [id number]. Topic: General - Other >> Dec 22, 2023 10:22 AM Rosaria A wrote: Reason for CRM: Melissa with [facility] called to get patients last appointment date and the fax number to the office. Melissa is going to fax over a clinical request form and a form for continence supplies.

## 2023-12-25 ENCOUNTER — Ambulatory Visit: Admitting: Dietician

## 2023-12-25 ENCOUNTER — Ambulatory Visit: Admitting: Student

## 2023-12-25 NOTE — Progress Notes (Deleted)
 CC: ***  HPI: Ashley Vaughn is a 58 y.o. female living with a history stated below and presents today for ***. Please see problem based assessment and plan for additional details.  Past Medical History:  Diagnosis Date   AKI (acute kidney injury) 08/24/2021   CVA (cerebral vascular accident) (HCC) 07/20/2021   Depression    Diabetes mellitus    Fistula    Heart failure (HCC)    HIV infection (HCC)    HSV (herpes simplex virus) anogenital infection 03/12/2019   Postoperative infection 07/18/2021   Patient had appendectomy early in May, followed by a hospitalization and drain placement for an intrabdominal infection and abscess.   Postprocedural intraabdominal abscess (HCC) 07/20/2021   Intra-abdominal abscess with fistulous communication between abscess cavity and cecum.   S/P laparoscopic appendectomy 06/22/2021   Seizure (HCC) 07/18/2021    Current Outpatient Medications on File Prior to Visit  Medication Sig Dispense Refill   Accu-Chek Softclix Lancets lancets CHECK BLOOD SUGAR THREE TIMES PER DAY 270 each 3   bictegravir-emtricitabine -tenofovir  AF (BIKTARVY ) 50-200-25 MG TABS tablet Take 1 tablet by mouth daily. 30 tablet 5   Blood Glucose Monitoring Suppl (ACCU-CHEK GUIDE) w/Device KIT To monitor blood sugar, use in the morning before first meal of the day. 1 kit 0   carvedilol  (COREG ) 3.125 MG tablet Take 1 tablet (3.125 mg total) by mouth 2 (two) times daily with a meal. 60 tablet 3   Continuous Glucose Receiver (DEXCOM G7 RECEIVER) DEVI For glucose monitoring 1 each 0   Continuous Glucose Sensor (DEXCOM G7 SENSOR) MISC For glucose monitoring, replace every 2 weeks. 4 each 5   Continuous Glucose Sensor (FREESTYLE LIBRE 3 SENSOR) MISC Place 1 sensor on the skin every 14 days. Use to check glucose continuously 2 each 3   DULoxetine  (CYMBALTA ) 60 MG capsule Take 1 capsule (60 mg total) by mouth daily. 30 capsule 0   Ensure (ENSURE) Take 237 mLs by mouth 2 (two) times daily  between meals. 237 mL 12   fluconazole  (DIFLUCAN ) 150 MG tablet Take 1 tablet (150 mg total) by mouth daily. 3 tablet 0   Glucose Blood (BLOOD GLUCOSE TEST STRIPS) STRP 1 each by In Vitro route 3 (three) times daily with meals. May substitute to any manufacturer covered by patient's insurance. 100 strip 0   insulin  glargine-yfgn (SEMGLEE , YFGN,) 100 UNIT/ML injection Inject 0.06 mLs (6 Units total) into the skin daily. 10 mL 11   Insulin  Pen Needle (PEN NEEDLES 3/16) 31G X 5 MM MISC Use once to inject insulin  100 each 3   leptospermum manuka honey (MEDIHONEY) PSTE paste Apply 1 Application topically daily. Apply once daily to sacral wound with gauze over top 44 mL 0   rosuvastatin  (CRESTOR ) 10 MG tablet Take 1 tablet (10 mg total) by mouth daily. 30 tablet 10   sacubitril-valsartan (ENTRESTO ) 24-26 MG Take 1 tablet by mouth 2 (two) times daily. 60 tablet 0   Semaglutide ,0.25 or 0.5MG /DOS, 2 MG/3ML SOPN Inject 0.25 mg into the skin once a week. 3 mL 3   No current facility-administered medications on file prior to visit.    Family History  Problem Relation Age of Onset   Diabetes Mother    Hypertension Mother    Heart disease Father    Arthritis Father    Diabetes Maternal Uncle     Social History   Socioeconomic History   Marital status: Legally Separated    Spouse name: Not on file   Number  of children: Not on file   Years of education: Not on file   Highest education level: Not on file  Occupational History   Not on file  Tobacco Use   Smoking status: Never    Passive exposure: Current   Smokeless tobacco: Never  Vaping Use   Vaping status: Never Used  Substance and Sexual Activity   Alcohol use: Not Currently    Comment: occasional   Drug use: No    Comment: h/o cocaine abuse, clean since 2009   Sexual activity: Never    Partners: Male    Comment: pt. declined condoms  Other Topics Concern   Not on file  Social History Narrative   Not on file   Social Drivers of  Health   Financial Resource Strain: High Risk (11/01/2021)   Overall Financial Resource Strain (CARDIA)    Difficulty of Paying Living Expenses: Very hard  Food Insecurity: Food Insecurity Present (12/22/2022)   Hunger Vital Sign    Worried About Running Out of Food in the Last Year: Sometimes true    Ran Out of Food in the Last Year: Sometimes true  Transportation Needs: No Transportation Needs (12/22/2022)   PRAPARE - Administrator, Civil Service (Medical): No    Lack of Transportation (Non-Medical): No  Physical Activity: Not on file  Stress: Not on file  Social Connections: Socially Isolated (01/02/2022)   Social Connection and Isolation Panel    Frequency of Communication with Friends and Family: Once a week    Frequency of Social Gatherings with Friends and Family: Once a week    Attends Religious Services: Never    Database Administrator or Organizations: No    Attends Banker Meetings: Never    Marital Status: Separated  Intimate Partner Violence: Unknown (12/22/2022)   Humiliation, Afraid, Rape, and Kick questionnaire    Fear of Current or Ex-Partner: No    Emotionally Abused: No    Physically Abused: No    Sexually Abused: Not on file    Review of Systems: ROS negative except for what is noted on the assessment and plan.  There were no vitals filed for this visit.  Physical Exam  Physical Exam: Constitutional: well-appearing *** sitting in ***, in no acute distress HENT: normocephalic atraumatic, mucous membranes moist Eyes: conjunctiva non-erythematous Cardiovascular: regular rate and rhythm, no m/r/g Pulmonary/Chest: normal work of breathing on room air, lungs clear to auscultation bilaterally Abdominal: soft, non-tender, non-distended MSK: *** Neurological: alert & oriented x 3, 5/5 strength in bilateral upper and lower extremities, normal gait Skin: warm and dry Psych: ***  Assessment & Plan:   Assessment & Plan     No orders  of the defined types were placed in this encounter.  PMH: HTN, HFrEF, T2DM, HIV, MCI, MDD  Follow-up from 2 weeks ago for chronic condition management including T2DM uncontrolled.  A1c greater than 14 and at LOV was restarted on her prior diabetic regimen of insulin , Ozempic  and Synjardy .  She has trouble with injections, will send referral to [facility] to provide further education.  Referral to ophthalmology to help with her vision.  Referral to see our pharmacists (Dr. Brinda) regarding medication assistance/adherence.  We are stopping Synjardy  at this time in setting of orthostatic hypotension but will continue insulin  and Synjardy  with close follow-up and hopefully see [facility] soon.   Acute dizziness which appears she was orthostatic positive on arrival.  Did restart majority of her home medications after not taking  for past 5-6 months.  Notably was on Synjardy  and took that last night.  Does endorse some food insecurity.  Neuroexam unremarkable without focal deficits.  No nystagmus.  Orthostatic vitals improved with p.o. fluids in the office.  Will stop Synjardy  at this time.  Check BMP and CBC today.  Plan for 1 week follow-up.  Patient encouraged to bring all her medications (and pill packs) to review for full med rec.   Return precautions discussed with patient. Patient verbalizes understanding of plan and all questions addressed.   Med reconciliation ***Biktarvy  ***Carvedilol  3.125 mg twice daily ***Cymbalta  60 mg ***Semglee  6 units daily ***Rosuvastatin  10 mg ***Entresto  24-26 twice daily ***Ozempic  0.25 mg weekly  No follow-ups on file.   Patient {GC/GE:[id number]::discussed with,seen with} Dr. {[id number]::Tpoopjfd,Z. Hoffman,Winfrey,Narendra,Chun,Chambliss,Lau,Machen}  Ozell Nearing, D.O. [facility], PGY-3 Clinic Phone: [phone] Date 12/25/2023 Time 7:51 AM

## 2023-12-25 NOTE — Telephone Encounter (Signed)
  Name: [name], [name] MRN: [id number]  Date: 12/26/2023 Status: Sch  Time: 10:45 AM Length: 30  Visit Type: OFFICE VISIT [8002] Copay: $0.00  Provider: Jolaine Pac, DO      Copied from CRM [id number]. Topic: Appointments - Appointment Cancel/Reschedule >> Dec 25, 2023  7:55 AM Marda G wrote: Please call [name], she is having a scheduling conflict and would like to know if her appointments today can be moved up to an earlier time slot.   Please advise

## 2023-12-26 ENCOUNTER — Other Ambulatory Visit: Payer: Self-pay

## 2023-12-26 ENCOUNTER — Ambulatory Visit: Admitting: Student

## 2023-12-26 VITALS — BP 180/82 | HR 81 | Temp 98.2°F | Ht 62.0 in | Wt 151.2 lb

## 2023-12-26 DIAGNOSIS — Z833 Family history of diabetes mellitus: Secondary | ICD-10-CM

## 2023-12-26 DIAGNOSIS — E1140 Type 2 diabetes mellitus with diabetic neuropathy, unspecified: Secondary | ICD-10-CM | POA: Diagnosis not present

## 2023-12-26 DIAGNOSIS — N3941 Urge incontinence: Secondary | ICD-10-CM

## 2023-12-26 DIAGNOSIS — W010XXA Fall on same level from slipping, tripping and stumbling without subsequent striking against object, initial encounter: Secondary | ICD-10-CM

## 2023-12-26 DIAGNOSIS — R350 Frequency of micturition: Secondary | ICD-10-CM

## 2023-12-26 DIAGNOSIS — S99911A Unspecified injury of right ankle, initial encounter: Secondary | ICD-10-CM

## 2023-12-26 DIAGNOSIS — Z8673 Personal history of transient ischemic attack (TIA), and cerebral infarction without residual deficits: Secondary | ICD-10-CM

## 2023-12-26 DIAGNOSIS — Z794 Long term (current) use of insulin: Secondary | ICD-10-CM

## 2023-12-26 DIAGNOSIS — E1165 Type 2 diabetes mellitus with hyperglycemia: Secondary | ICD-10-CM

## 2023-12-26 DIAGNOSIS — D649 Anemia, unspecified: Secondary | ICD-10-CM

## 2023-12-26 LAB — POCT URINALYSIS DIPSTICK
Bilirubin, UA: NEGATIVE
Glucose, UA: POSITIVE — AB
Ketones, UA: NEGATIVE
Leukocytes, UA: NEGATIVE
Nitrite, UA: NEGATIVE
Protein, UA: POSITIVE — AB
Spec Grav, UA: >=1.03 — AB (ref 1.010–1.025)
Urobilinogen, UA: 0.2 U/dL
pH, UA: 5 (ref 5.0–8.0)

## 2023-12-26 LAB — BASIC METABOLIC PANEL WITH GFR
Anion gap: 12 (ref 5–15)
BUN: 26 mg/dL — ABNORMAL HIGH (ref 6–20)
CO2: 22 mmol/L (ref 22–32)
Calcium: 9.2 mg/dL (ref 8.9–10.3)
Chloride: 102 mmol/L (ref 98–111)
Creatinine, Ser: 1.75 mg/dL — ABNORMAL HIGH (ref 0.44–1.00)
GFR, Estimated: 34 mL/min — ABNORMAL LOW (ref >60)
Glucose, Bld: 287 mg/dL — ABNORMAL HIGH (ref 70–99)
Potassium: 4.4 mmol/L (ref 3.5–5.1)
Sodium: 136 mmol/L (ref 135–145)

## 2023-12-26 LAB — BETA-HYDROXYBUTYRIC ACID: Beta-Hydroxybutyric Acid: 0.3 mmol/L — ABNORMAL HIGH (ref 0.05–0.27)

## 2023-12-26 MED ORDER — BLOOD GLUCOSE MONITOR SYSTEM W/DEVICE KIT
1.0000 | PACK | 0 refills | Status: AC
  Filled 2023-12-26: qty 1, 30d supply, fill #0

## 2023-12-26 MED ORDER — DICLOFENAC SODIUM 1 % EX GEL
2.0000 g | Freq: Four times a day (QID) | CUTANEOUS | 1 refills | Status: AC
  Filled 2023-12-26: qty 300, 38d supply, fill #0

## 2023-12-26 MED ORDER — LANTUS SOLOSTAR 100 UNIT/ML ~~LOC~~ SOPN
6.0000 [IU] | PEN_INJECTOR | Freq: Every day | SUBCUTANEOUS | 1 refills | Status: AC
  Filled 2023-12-26: qty 9, 84d supply, fill #0

## 2023-12-26 MED ORDER — LANCETS MISC
1.0000 | 0 refills | Status: DC
  Filled 2023-12-26: qty 100, 25d supply, fill #0

## 2023-12-26 MED ORDER — BLOOD GLUCOSE TEST VI STRP
1.0000 | ORAL_STRIP | 0 refills | Status: AC
  Filled 2023-12-26: qty 100, 25d supply, fill #0

## 2023-12-26 MED ORDER — LANCET DEVICE MISC
1.0000 | 0 refills | Status: DC
  Filled 2023-12-26: qty 1, fill #0

## 2023-12-26 NOTE — Patient Instructions (Addendum)
 Thank you, [name], for allowing us  to provide your care today. Today we discussed . . .  > Urinary incontinence       - It does not look like you have a urinary tract infection and we will send your urine for a formal urinalysis to make sure there are no other signs of an infection under the microscope.  I think a lot of it does have to do with your high blood sugars as this may be causing you to pee a lot and due to your ankle pain it is hard for you to get to the bathroom.  If this does not get better after we are able to improve your blood sugar we will send you to the urogynecologist for bladder testing.  I will send a referral for more urinary incontinence supplies like pull-ups to make sure that you have enough. > Medication review       - Due to your very high blood sugars and recent change in kidney function I think it is very important that you bring all of your medications with you to your visit next week.  Please bring the whole blister pack and any documents that came with it.  At your visit we will go over all these medications and make sure we know which ones you should be taking and which ones you should not be taking. Start taking insulin  glargine 6 units nightly and checking your blood sugar at least twice a day starting tonight.  > Ankle sprain       - I think you definitely sprained your ankle and due to your neuropathy I would like to make sure that you did not break any bones.  I have sent an order for you to get an x-ray at [facility].  In the meantime I would like you to continue to use ice over the area and ideally pick up an ankle splint/brace which you can get at pharmacies or places like [facility].  You can also use compression wrap such as Ace wrap to help bring down the swelling.  You can use topical pain relievers such as Voltaren gel but due to your kidney function please do not take any pill forms of ibuprofen, naproxen , diclofenac, meloxicam, or aspirin .   Whenever we have the results of your x-rays back we will discuss this either at your next visit or over the phone.   I have ordered the following labs for you:  Lab Orders         UA/M w/rflx Culture, Routine         Iron, TIBC and Ferritin Panel         Basic metabolic panel with GFR         Beta-hydroxybutyric acid         POCT Urinalysis Dipstick (18997)       Referrals ordered today:   Referral Orders         Ambulatory Referral for DME        Follow up: 1 week    Remember:  Should you have any questions or concerns please call the internal medicine clinic at [phone].     Fairy Pool, DO [facility]

## 2023-12-26 NOTE — Assessment & Plan Note (Signed)
 Hemoglobin 10.7 on CBC from 12/18/2023.  Unable to draw iron labs today but there is an order for a future lab draw.

## 2023-12-26 NOTE — Progress Notes (Signed)
 CC: Acute Concern of urinary incontinence and right ankle pain  HPI:  Ashley Vaughn is a 58 y.o. female with pertinent PMH of HTN, HFrEF, T2DM, HIV, MCI, and MDD who presents as above. Please see assessment and plan below for further details.  Medications: Current Outpatient Medications  Medication Instructions   bictegravir-emtricitabine -tenofovir  AF (BIKTARVY ) 50-200-25 MG TABS tablet 1 tablet, Oral, Daily   Blood Glucose Monitoring Suppl (BLOOD GLUCOSE MONITOR SYSTEM) w/Device KIT Use up to four times daily as directed. (FOR ICD-10 E10.9, E11.9).   carvedilol  (COREG ) 3.125 mg, Oral, 2 times daily with meals   Continuous Glucose Receiver (DEXCOM G7 RECEIVER) DEVI For glucose monitoring   Continuous Glucose Sensor (DEXCOM G7 SENSOR) MISC For glucose monitoring, replace every 2 weeks.   Continuous Glucose Sensor (FREESTYLE LIBRE 3 SENSOR) MISC Place 1 sensor on the skin every 14 days. Use to check glucose continuously   diclofenac Sodium (VOLTAREN ARTHRITIS PAIN) 2 g, Topical, 4 times daily   DULoxetine  (CYMBALTA ) 60 mg, Oral, Daily   Ensure (ENSURE) 237 mLs, Oral, 2 times daily between meals   fluconazole  (DIFLUCAN ) 150 mg, Oral, Daily   Glucose Blood (BLOOD GLUCOSE TEST STRIPS) STRP Use up to four times daily as directed. (FOR ICD-10 E10.9, E11.9).   Insulin  Pen Needle (PEN NEEDLES 3/16) 31G X 5 MM MISC Use once to inject insulin    Lancet Device MISC Use up to four times daily as directed. (FOR ICD-10 E10.9, E11.9).   Lancets MISC Use up to four times daily as directed. (FOR ICD-10 E10.9, E11.9).   Lantus  SoloStar 6 Units, Subcutaneous, Daily at bedtime   leptospermum manuka honey (MEDIHONEY) PSTE paste 1 Application, Topical, Daily, Apply once daily to sacral wound with gauze over top   rosuvastatin  (CRESTOR ) 10 mg, Oral, Daily   sacubitril-valsartan (ENTRESTO ) 24-26 MG 1 tablet, Oral, 2 times daily   Semaglutide (0.25 or 0.5MG /DOS) 0.25 mg, Subcutaneous, Weekly     Review of  Systems:   Pertinent items noted in HPI and/or A&P.  Physical Exam:  Vitals:   12/26/23 1008 12/26/23 1055  BP: (!) 164/88 (!) 180/82  Pulse: 81   Temp: 98.2 F (36.8 C)   TempSrc: Oral   SpO2: 100%   Weight: 151 lb 3.2 oz (68.6 kg)   Height: 5' 2 (1.575 m)     Constitutional: Chronically ill-appearing elderly female. In no acute distress. HEENT: Normocephalic, atraumatic, Sclera non-icteric, PERRL, EOM intact Cardio:Regular rate and rhythm. 2+ bilateral radial and dorsalis pedis  pulses. Pulm: Normal work of breathing on room air. Abdomen: Soft, mild suprapubic tenderness, non-distended, positive bowel sounds. MSK: Edema of the right ankle and foot without erythema, bruising, or rash.  Tenderness to palpation over the anterior talofibular ligament on the right ankle and tenderness with metatarsal squeeze on the right foot.  Mildly limited active range of motion of the right ankle but full passive range of motion. Skin:Warm and dry. Neuro:Alert and oriented x3. No focal deficit noted.   Assessment & Plan:   Assessment & Plan Urge incontinence Urinary frequency Patient presents with 1 week of severely bothersome urinary frequency, urgency, and incontinence.  She has some associated suprapubic fullness but no dysuria, hematuria, fever, flank pain, or other signs of systemic infection.  She has had a UTI in the past but does not quite remember if the symptoms were similar to this.  She also notes that she has to change her body position to start urinating sometimes during the past week.  She  denies any previous urge incontinence, stress incontinence, or issues with urinary retention.  She has been using adult diapers and has had to use 12-16 diapers a day. On exam there is mild suprapubic tenderness with reproduction of urinary urgency.  Urine dipstick in the office is negative for nitrites, leukocytes, and ketones but positive for blood, strongly positive for glucose, and protein.  We  will send her urine for further analysis but most likely her urinary symptoms have been from elevated blood sugars rather than infection.  She also has significant stressors at home and a recent ankle injury that probably are impacting her symptoms and limiting her ability to get to the bathroom on time. - UA with microscopy reflex to culture - Diabetes treatment as below Poorly controlled type 2 diabetes mellitus with neuropathy (HCC) A1c last month was above 14.  She has been prescribed Synjardy , insulin , and Ozempic  but due to changing renal function and all of her medications being a blister packs she is not taking any oral medications at the moment.  She did take 1 dose of the Ozempic  without significant side effects but has not taken this again.  She has not been able to take the insulin  due to it being in a vial and she has difficulty drawing up a small amount of liquid.  She is confident that she can use an insulin  pen.  She does not yet have a glucometer.  She has used CGM's in the past but has had issues getting these.  Urinalysis today did not show any ketones and BMP shows glucose of 287 with an anion gap of 12.  Beta hydroxybutyrate is mildly elevated at 0.3 however she has not eaten anything since last night.  She has an appointment with diabetic coordinator on 12/31/2023.   - Start insulin  glargine 6 units nightly and new glucometer sent to the pharmacy - Return on 12/31/2023 for a 1 hour appointment with a provider followed by an appointment with the diabetes coordinator, she is to bring in all of her medications to this appointment Injury of right ankle, initial encounter About 5 days ago she had a ground-level fall where she twisted her right ankle.  She had an ankle inversion injury with significant pain and swelling following.  She did not put weight on her right ankle for about 2 days but has been able to bear weight with pain since then.  On exam there is significant edema without  bruising over the right ankle and foot.  She has tenderness over the anterior talofibular ligament and some tenderness with metatarsal squeeze.  She does have baseline peripheral neuropathy which makes me more worried about underlying fracture.  She has been using ice intermittently with some benefit. - Right ankle and foot x-rays - Recommended to wear an ankle brace, use topical Voltaren, and continue with ice and elevation Anemia, unspecified type Hemoglobin 10.7 on CBC from 12/18/2023.  Unable to draw iron labs today but there is an order for a future lab draw.  Orders Placed This Encounter  Procedures   DG Ankle Complete Right    Standing Status:   Future    Expiration Date:   12/25/2024    Reason for Exam (SYMPTOM  OR DIAGNOSIS REQUIRED):   ankle swelling and midfoot pain    Is patient pregnant?:   No    Preferred imaging location?:   [facility]   DG Foot Complete Right    Standing Status:   Future  Expiration Date:   12/25/2024    Reason for Exam (SYMPTOM  OR DIAGNOSIS REQUIRED):   ankle swelling and midfoot pain    Is patient pregnant?:   No    Preferred imaging location?:   [facility]   UA/M w/rflx Culture, Routine   Iron, TIBC and Ferritin Panel    Standing Status:   Future    Number of Occurrences:   1    Expected Date:   06/24/2024    Expiration Date:   06/21/2024   Basic metabolic panel with GFR    Standing Status:   Future    Number of Occurrences:   1    Expected Date:   06/24/2024    Expiration Date:   06/21/2024   Beta-hydroxybutyric acid    Standing Status:   Future    Number of Occurrences:   1    Expected Date:   06/24/2024    Expiration Date:   06/21/2024   Ambulatory Referral for DME    Referral Priority:   Routine    Referral Type:   Durable Medical Equipment Purchase    Number of Visits Requested:   1   POCT Urinalysis Dipstick (81002)     Return in about 1 week (around 01/02/2024) for Med review, lab review, bring blister pill pack with her.    Patient discussed with Dr. Ronnald Sergeant  Fairy Pool, DO [facility] Internal Medicine Resident PGY-3 Clinic Phone: [phone] Please contact the on call pager at [phone] for any urgent or emergent needs.

## 2023-12-26 NOTE — Assessment & Plan Note (Addendum)
 A1c last month was above 14.  She has been prescribed Synjardy , insulin , and Ozempic  but due to changing renal function and all of her medications being a blister packs she is not taking any oral medications at the moment.  She did take 1 dose of the Ozempic  without significant side effects but has not taken this again.  She has not been able to take the insulin  due to it being in a vial and she has difficulty drawing up a small amount of liquid.  She is confident that she can use an insulin  pen.  She does not yet have a glucometer.  She has used CGM's in the past but has had issues getting these.  Urinalysis today did not show any ketones and BMP shows glucose of 287 with an anion gap of 12.  Beta hydroxybutyrate is mildly elevated at 0.3 however she has not eaten anything since last night.  She has an appointment with diabetic coordinator on 12/31/2023.   - Start insulin  glargine 6 units nightly and new glucometer sent to the pharmacy - Return on 12/31/2023 for a 1 hour appointment with a provider followed by an appointment with the diabetes coordinator, she is to bring in all of her medications to this appointment

## 2023-12-29 ENCOUNTER — Telehealth: Payer: Self-pay

## 2023-12-29 LAB — UA/M W/RFLX CULTURE, ROUTINE
Bilirubin, UA: NEGATIVE
Ketones, UA: NEGATIVE
Leukocytes,UA: NEGATIVE
Nitrite, UA: NEGATIVE
Specific Gravity, UA: 1.025 (ref 1.005–1.030)
Urobilinogen, Ur: 0.2 mg/dL (ref 0.2–1.0)
pH, UA: 5 (ref 5.0–7.5)

## 2023-12-29 LAB — URINE CULTURE, REFLEX

## 2023-12-29 LAB — MICROSCOPIC EXAMINATION: Casts: NONE SEEN /LPF

## 2023-12-29 NOTE — Progress Notes (Signed)
 Internal Medicine Clinic Attending  I was physically present during the key portions of the resident provided service and participated in the medical decision making of patient's management care. I reviewed pertinent patient test results.  The assessment, diagnosis, and plan were formulated together and I agree with the documentation in the resident's note.  Dickie La, MD

## 2023-12-29 NOTE — Addendum Note (Signed)
 Addended by: KARNA FELLOWS on: 12/29/2023 09:44 AM   Modules accepted: Level of Service

## 2023-12-29 NOTE — Progress Notes (Unsigned)
 Care Guide Pharmacy Note  12/29/2023 Name: Ashley Vaughn MRN: [id number] DOB: May 11, 1965  Referred By: Norrine Sharper, MD Reason for referral: Complex Care Management and Call Attempt #1 (Unsuccessful initial outreach to schedule with PHARM D-Layna)   Ashley Vaughn is a 58 y.o. year old female who is a primary care patient of Norrine Sharper, MD.  Ashley Vaughn was referred to the pharmacist for assistance related to: DMII  An unsuccessful telephone outreach was attempted today to contact the patient who was referred to the pharmacy team for assistance with medication assistance. Additional attempts will be made to contact the patient.  Leotis Rase [facility], [facility] Guide  Direct Dial: [phone]  Fax [phone]

## 2023-12-30 ENCOUNTER — Ambulatory Visit: Admitting: Infectious Diseases

## 2023-12-30 NOTE — Progress Notes (Signed)
 Care Guide Pharmacy Note  12/30/2023 Name: Ashley Vaughn MRN: [id number] DOB: 1965-06-23  Referred By: Norrine Sharper, MD Reason for referral: Complex Care Management, Call Attempt #1 (Unsuccessful initial outreach to schedule with PHARM D-Layna), Call Attempt #2 (Unsuccessful initial outreach to schedule with PHARM DGLENWOOD Bun ), and Call Attempt #3 (Successful initial outreach scheduled with PHARM D- Bun)   Ashley Vaughn is a 58 y.o. year old female who is a primary care patient of Norrine Sharper, MD.  Ashley Vaughn was referred to the pharmacist for assistance related to: DMII  Successful contact was made with the patient to discuss pharmacy services including being ready for the pharmacist to call at least 5 minutes before the scheduled appointment time and to have medication bottles and any blood pressure readings ready for review. The patient agreed to meet with the pharmacist via telephone visit on (date/time). 01/05/24 @ 2 PM    Leotis Cloria Davene Salome Romell Ralph [place], [facility] Guide  Direct Dial: [phone]  Fax [phone]

## 2023-12-30 NOTE — Progress Notes (Signed)
 Care Guide Pharmacy Note  12/30/2023 Name: JACQUETTE CANALES MRN: [id number] DOB: Sep 04, 1965  Referred By: Norrine Sharper, MD Reason for referral: Complex Care Management, Call Attempt #1 (Unsuccessful initial outreach to schedule with [facility] D-Layna), and Call Attempt #2 (Unsuccessful initial outreach to schedule with PHARM D- [place] )   NIKOLETA DADY is a 58 y.o. year old female who is a primary care patient of Norrine Sharper, MD.  FINA HEIZER was referred to the pharmacist for assistance related to: DMII  A second unsuccessful telephone outreach was attempted today to contact the patient who was referred to the pharmacy team for assistance with medication assistance. Additional attempts will be made to contact the patient.  Leotis Rase [facility], [facility] Guide  Direct Dial: [phone]  Fax [phone]

## 2023-12-31 ENCOUNTER — Ambulatory Visit: Admitting: Dietician

## 2023-12-31 ENCOUNTER — Ambulatory Visit: Payer: Self-pay

## 2023-12-31 ENCOUNTER — Ambulatory Visit

## 2023-12-31 NOTE — Patient Instructions (Incomplete)
 Thank you, [name] for allowing us  to provide your care today. Today we discussed ***.    Referrals: -  New medications: -  I have ordered the following labs for you:  Lab Orders  No laboratory test(s) ordered today     I will call if any are abnormal. All of your labs can be accessed through My Chart.   My Chart Access: [email or web address]  Please follow-up in:    We look forward to seeing you next time. Please call our clinic at [phone] if you have any questions or concerns. The best time to call is Monday-Friday from 9am-4pm, but there is someone available 24/7. If after hours or the weekend, call the main hospital number and ask for the Internal Medicine Resident On-Call. If you need medication refills, please notify your pharmacy one week in advance and they will send us  a request.   Thank you for letting us  take part in your care. Wishing you the best!  Doss Cybulski, DO 12/31/2023, 7:53 AM Ashley Vaughn [facility] Res[facility] Program

## 2023-12-31 NOTE — Telephone Encounter (Signed)
 FYI Only or Action Required?: Action required by provider: wishes to speak to PCP.  Patient was last seen in primary care on 12/26/2023 by Jolaine Pac, DO.  Called Nurse Triage reporting Medication Reaction.  Symptoms began today.  Interventions attempted: Nothing.  Symptoms are: stable.  Triage Disposition: Call PCP When Office is Open  Patient/caregiver understands and will follow disposition?: Yes Reason for Disposition  [1] Caller has NON-URGENT medicine question about med that PCP prescribed AND [2] triager unable to answer question  Answer Assessment - Initial Assessment Questions Patient states she started a new medication last night and its made her so dizzy she couldn't function today. Patient does not know the medication or who prescribed it, she stated its in a pack with a whole bunch of other medications and cannot tell me what it is, but she takes it at night. Do not see any newly prescribed medications that patient takes at night. Patient states she will just stop taking it until her med reconciliation on 11/17.   1. NAME of MEDICINE: What medicine(s) are you calling about?     Unsure  2. PRESCRIBER: Who prescribed the medicine? Reason: if prescribed by specialist, call should be referred to that group.     Unsure  3. SYMPTOMS: Do you have any symptoms? If Yes, ask: What symptoms are you having?  How bad are the symptoms (e.g., mild, moderate, severe)     Bad dizziness could not leave the house today, slept all day  Protocols used: Medication Question Call-A-AH  Copied from CRM #[id number]. Topic: Clinical - Red Word Triage >> Dec 31, 2023  3:14 PM Chiquita SQUIBB wrote: Red Word that prompted transfer to Nurse Triage: Patient is calling in stating that she started taking a new medication for the first time last night and has been extremely dizzy all day today and could not get up today due to the dizziness

## 2023-12-31 NOTE — Telephone Encounter (Signed)
Will forward to PCP's team.

## 2024-01-01 ENCOUNTER — Telehealth: Payer: Self-pay

## 2024-01-01 NOTE — Telephone Encounter (Signed)
 Ashley Vaughn (Key: [id number]) Rx #: [id number] Ozempic  (0.25 or 0.5 MG/DOSE) 2MG /3ML pen-injectors Form [employer] Medicare Part D Electronic Prior Authorization Form (2017 NCPDP) Created 1 hour ago Sent to Plan 40 minutes ago Plan Response 40 minutes ago Submit Clinical Questions 39 minutes ago Determination Favorable 1 minute ago Message from Plan Request Reference Number: [id number]. OZEMPIC  INJ 2MG /3ML is approved through 02/17/2025. Your patient may now fill this prescription and it will be covered.. Authorization Expiration Date: February 17, 2025.  Lvm regarding approval

## 2024-01-01 NOTE — Telephone Encounter (Signed)
 Prior Authorization for patient (Ozempic  (0.25 or 0.5 MG/DOSE) 2MG /3ML pen-injectors) came through on cover my meds was submitted with last office notes and labs awaiting approval or denial.  XZB:[id number]

## 2024-01-05 ENCOUNTER — Other Ambulatory Visit

## 2024-01-05 ENCOUNTER — Ambulatory Visit: Admitting: Dietician

## 2024-01-05 ENCOUNTER — Other Ambulatory Visit (HOSPITAL_COMMUNITY): Payer: Self-pay

## 2024-01-05 ENCOUNTER — Telehealth: Payer: Self-pay

## 2024-01-05 DIAGNOSIS — E1165 Type 2 diabetes mellitus with hyperglycemia: Secondary | ICD-10-CM

## 2024-01-05 DIAGNOSIS — E1140 Type 2 diabetes mellitus with diabetic neuropathy, unspecified: Secondary | ICD-10-CM

## 2024-01-05 MED ORDER — FREESTYLE LIBRE 3 PLUS SENSOR MISC: 3 refills | Status: AC

## 2024-01-05 MED ORDER — FREESTYLE LIBRE 3 READER DEVI: 0 refills | Status: AC

## 2024-01-05 NOTE — Progress Notes (Signed)
 01/05/2024 Name: Ashley Vaughn MRN: [id number] DOB: 17-Oct-1965  Chief Complaint  Patient presents with   Diabetes   Congestive Heart Failure    Ashley Vaughn is a 58 y.o. year old female who presented for a telephone visit.   They were referred to the pharmacist by their PCP for assistance in managing diabetes, hypertension, and complex medication management. SABRA PMH includes HTN, HFrEF ( EF30-35% in June 2023), T2DM, depression, cognitive impairment, HIV.   Subjective: Patient was seen by Dr. Elicia on 12/18/23. BP was 112/55 with acute dizziness and recent fall. Patient had recently restarted all oral medications for DM and HFrEF after not taking them for 5-6 months. She was instructed to stop Syjardy and hold Entresto  and carvedilol  until her follow-up visit. At follow-up with Dr. Foodwin on 12/26/23, BP was 180/82 mmHg. Her primary complaint was urinary frequency, determined to likely be from glucose elevation. She was instructed to start insulin  glargine (Solostar pen, as she had difficulty using the vial that was previously prescribed for her) at 6 units daily.   Today, patient reports doing ok. She is taking insulin  glargine 6 units daily. She is not taking any other oral medications. She reports that last week, she took her oral medication and she felt dizzy for 2-3 days. She is not sure which medication it was, but now the only thing she is taking is insulin .  Called [facility] to confirm contents of most recent pill packs. Pharmacy rep reported that most recent pack was delivered on 12/25/23. This pack contained:  - Biktarvy  50-200-25 mg daily - Carvedilol  3.125 mg BID - Duloxetine  60 mg daily - Entresto  24-26 mg BID - rosuvastatin  10 mg daily - Synjardy  12.06-998 mg BID   They also confirmed that they recently filled insulin  glargine (Lantus ) and Ozempic  0.25/0.5 mg for her.   Care Team: Primary Care Provider: Norrine Sharper, MD ; Next Scheduled Visit: no show on 12/21/23, needs  to be rescheduled  Medication Access/Adherence  Current Pharmacy:  [facility] - [place], [place] - [address] [place] [address] Phone: [phone] Fax: [phone]  [facility] [phone] - [place] [address] [facility] [address] [facility] [address] [place] [address] Phone: [phone] Fax: [phone]  [facility] - [facility] [address], [address] [place] [address] Phone: [phone] Fax: [phone]   Patient reports affordability concerns with their medications: No  - [employer] Dual Complete Patient reports access/transportation concerns to their pharmacy: Yes  - using [facility] delivery Patient reports adherence concerns with their medications:  Yes  - as above   Diabetes:  Current medications: insulin  glargine (Lantus ) 6 units daily,  Medications tried in the past: Ozempic  (took one dose, states she is not planning to restart because she does not want to take multiple medications for her sugars)  Current glucose readings: recalls reading of 265 mg/dL this morning, Ozempic  0.25 mg weekly (not taking) She does not have CGM supplies, but is interested in starting CGM monitoring  Patient reports hypoglycemic s/sx including dizziness- but this is more likely due to hypotension. Patient reports hyperglycemic symptoms including polyuria, polydipsia.  Current meal patterns: Did not discuss in depth today  Macrovascular and Microvascular Risk Reduction:  Statin? yes (rosuvastatin  10 mg daily - not taking at the moment due to confusion with pill packs); ACEi/ARB? yes (Entresto  24-26  mg BID - not taking at the moment due to confusion with pill packs) Last urinary albumin/creatinine ratio:  Lab Results  Component Value Date   MICRALBCREAT 2,464 (H) 12/04/2023   MICRALBCREAT 1,462 (H) 05/29/2022   MICRALBCREAT 529 (H) 12/21/2020    MICRALBCREAT 268 (H) 03/12/2019   MICRALBCREAT 225.2 (H) 05/05/2017   MICRALBCREAT 41.8 (H) 01/22/2012   MICRALBCREAT 59.6 (H) 07/01/2011   Last eye exam:   Last foot exam: 06/12/2022 Tobacco Use:  Tobacco Use: Medium Risk (12/18/2023)   Patient History    Smoking Tobacco Use: Never    Smokeless Tobacco Use: Never    Passive Exposure: Current     Objective:  BP Readings from Last 3 Encounters:  12/26/23 (!) 180/82  12/18/23 (!) 112/55  12/04/23 (!) 158/98    Lab Results  Component Value Date   HGBA1C >14.0 (A) 12/04/2023   HGBA1C 13.1 (A) 01/23/2023   HGBA1C 10.1 (A) 09/04/2022       Latest Ref Rng & Units 12/26/2023   11:46 AM 12/18/2023   10:09 AM 04/15/2023    9:46 AM  BMP  Glucose 70 - 99 mg/dL 712  697  818   BUN 6 - 20 mg/dL 26  29  15    Creatinine 0.44 - 1.00 mg/dL 8.24  8.10  8.61   BUN/Creat Ratio 9 - 23  15  11    Sodium 135 - 145 mmol/L 136  136  140   Potassium 3.5 - 5.1 mmol/L 4.4  4.1  4.0   Chloride 98 - 111 mmol/L 102  102  104   CO2 22 - 32 mmol/L 22  17  19    Calcium  8.9 - 10.3 mg/dL 9.2  9.7  8.9     Lab Results  Component Value Date   CHOL 177 01/30/2023   HDL 43 (L) 01/30/2023   LDLCALC 107 (H) 01/30/2023   TRIG 157 (H) 01/30/2023   CHOLHDL 4.1 01/30/2023    Medications Reviewed Today     Reviewed by Brinda Lorain SQUIBB, RPH (Pharmacist) on 01/05/24 at 1453  Med List Status: <None>   Medication Order Taking? Sig Documenting Provider Last Dose Status Informant  bictegravir-emtricitabine -tenofovir  AF (BIKTARVY ) 50-200-25 MG TABS tablet [id number]  Take 1 tablet by mouth daily.  Patient not taking: Reported on 01/05/2024   Harrie Bruckner, DO  Active   Blood Glucose Monitoring Suppl (BLOOD GLUCOSE MONITOR SYSTEM) w/Device KIT [id number]  Use up to four times daily as directed. (FOR ICD-10 E10.9, E11.9). Jolaine Pac, DO  Active   carvedilol  (COREG ) 3.125 MG tablet [id number]  Take 1 tablet (3.125 mg total) by mouth 2 (two) times daily with  a meal.  Patient not taking: Reported on 01/05/2024   Harrie Bruckner, DO  Active     Discontinued 01/05/24 1450 (Change in therapy)   Continuous Glucose Receiver (FREESTYLE LIBRE 3 READER) DEVI [id number] Yes Use as directed to monitor glucose with freestyle libre 3 sensor Jolaine Pac, DO  Active     Discontinued 01/05/24 1450 (Change in therapy)   Continuous Glucose Sensor (FREESTYLE LIBRE 3 PLUS SENSOR) MISC [id number] Yes Change sensor every 15 days. Jolaine Pac, DO  Active     Discontinued 01/05/24 1450 (Change in therapy)   diclofenac Sodium (VOLTAREN ARTHRITIS PAIN) 1 % GEL [id number]  Apply 2 g topically 4 (four) times daily. Jolaine Pac, DO  Active   DULoxetine  (CYMBALTA ) 60 MG capsule [id number]  Take 1 capsule (60 mg total) by mouth daily.  Patient not  taking: Reported on 01/05/2024   Harrie Bruckner, DO  Active   Ensure [facility]) [id number]  Take 237 mLs by mouth 2 (two) times daily between meals. Norrine Sharper, MD  Active     Discontinued 01/05/24 1453 (Completed Course)   Glucose Blood (BLOOD GLUCOSE TEST STRIPS) STRP [id number]  Use up to four times daily as directed. (FOR ICD-10 E10.9, E11.9). Jolaine Pac, DO  Active   insulin  glargine (LANTUS  SOLOSTAR) 100 UNIT/ML Solostar Pen [id number] Yes Inject 6 Units into the skin at bedtime. Jolaine Pac, DO  Active   Insulin  Pen Needle (PEN NEEDLES 3/16) 31G X 5 MM MISC [id number]  Use once to inject insulin  Harrie Bruckner, DO  Active     Discontinued 01/05/24 1452 (No longer needed (for PRN medications))     Discontinued 01/05/24 1453 (Duplicate)   leptospermum manuka honey (MEDIHONEY) PSTE paste [id number]  Apply 1 Application topically daily. Apply once daily to sacral wound with gauze over top Melvenia Corean SAILOR, NP  Active   rosuvastatin  (CRESTOR ) 10 MG tablet [id number]  Take 1 tablet (10 mg total) by mouth daily.  Patient not taking: Reported on 01/05/2024   Harrie Bruckner, DO  Active    sacubitril-valsartan (ENTRESTO ) 24-26 MG [id number]  Take 1 tablet by mouth 2 (two) times daily.  Patient not taking: Reported on 01/05/2024   Harrie Bruckner, DO  Active   Semaglutide ,0.25 or 0.5MG /DOS, 2 MG/3ML NELMA [id number]  Inject 0.25 mg into the skin once a week.  Patient not taking: Reported on 01/05/2024   Harrie Bruckner, DO  Active               Assessment/Plan:   Diabetes: - Currently uncontrolled with most recent A1C of > 14.0% above goal <7%. Medication adherence appears inappropriate due to lack of understanding of regimen. Patient was supposed to stop Synjardy , but this was still included in her most recent pill back. Agree with stopping SGLT2i at the moment with urinary complaints and A1C > 10%. If she is going to continue on metformin , she will need a dose reduction with eGFR < 45 mL/min. She is no longer taking Ozempic  and is not willing to restart at this time despite discussion of risk/benefit today. She is willing to continue insulin  glargine at 6 units daily, but does not want to increase the dose today.  - Last UACR Oct 2025: 2464 mg/g - Reviewed long term cardiovascular and renal outcomes of uncontrolled blood sugar - Reviewed goal A1c, goal fasting, and goal 2 hour post prandial glucose - Reviewed hypoglycemia management plan and the rule of 15 - Recommend to continue insulin  glargine 6 units daily - I am leaving Ozempic  on her medication list for now. Will discuss restarting at follow-up.  - Recommend to check glucose twice daily: fasting and 2-hr PPG . Counseled patient to bring glucometer or BG log to every appointment. Will collaborate with [facility] pharmacy to request fill (delivery) of FL3+ sensor and CGM today. Patient aware to bring CGM supplies to appt on Thursday. - Next A1C due 03/06/23     Medication Management: - Currently strategy insufficient to maintain appropriate adherence to prescribed medication regimen. Patient continues to  discontinue medications in pill packs because she does not understand what she is taking and she is having side effects to her medications like dizziness. Patient is agreeable to bring her pill packs in person to me on Thursday 01/08/24, at which time I will attempt to remove Synjardy . May remove carvedilol  and Entresto ,  pending BP control at that time.  - Called [facility] today to discontinue Synjardy  so it will not be included in next pill pack that is set to be filled on 01/21/24. Will continue to communicate medication changes to pharmacy.  Patient verbalized understanding of treatment plan.    Follow Up Plan:  Pharmacist in person 01/08/24 PCP clinic visit need to reschedule   Lorain Baseman, PharmD [facility] [phone]

## 2024-01-05 NOTE — Telephone Encounter (Signed)
 Attempted to contact patient for scheduled appointment for medication management x2. Left HIPAA compliant message for patient to return my call at their convenience.   Called [facility] to confirm contents of most recent pill packs. Pharmacy rep reported that most recent pack was delivered on 12/25/23. This pack contained:  - Biktarvy  50-200-25 mg daily - Carvedilol  3.125 mg BID - Duloxetine  60 mg daily - Entresto  24-26 mg BID - rosuvastatin  10 mg daily - Synjardy  12.06-998 mg BID  They also confirmed that they recently filled insulin  glargine (Lantus ) and Ozempic  0.25/0.5 mg for her.   They will not begin to fill next packs until 01/21/24. Today, I requested that they discontinue Synjardy  as instructed by PCP given last A1C > 14%, eGFR 34 mL/min, and patient's urinary symptoms. At appointment on 12/26/23, patient reported that she was not taking any oral medications.  Will re-attempt patient and attempt to schedule in person prior to this date to determine whether additional medications should be discontinued. Patient cancelled provider and DM educator appt on 12/31/23 due to lack of transportation.  Lorain Baseman, PharmD [facility] [phone]

## 2024-01-08 ENCOUNTER — Ambulatory Visit

## 2024-01-08 NOTE — Progress Notes (Deleted)
 01/08/2024 Name: RUBINA BASINSKI MRN: [id number] DOB: May 14, 1965  No chief complaint on file.   DIOR DOMINIK is a 58 y.o. year old female who presented for a face-to-face visit.   They were referred to the pharmacist by their PCP for assistance in managing diabetes, hypertension, and complex medication management. SABRA PMH includes HTN, HFrEF ( EF30-35% in June 2023), T2DM, depression, cognitive impairment, HIV.   Subjective: Patient was seen by Dr. Elicia on 12/18/23. BP was 112/55 with acute dizziness and recent fall. Patient had recently restarted all oral medications for DM and HFrEF after not taking them for 5-6 months. She was instructed to stop Syjardy and hold Entresto  and carvedilol  until her follow-up visit. At follow-up with Dr. Foodwin on 12/26/23, BP was 180/82 mmHg. Her primary complaint was urinary frequency, determined to likely be from glucose elevation. She was instructed to start insulin  glargine (Solostar pen, as she had difficulty using the vial that was previously prescribed for her) at 6 units daily. At pharmacy telephone call on 01/05/24, patient reported she was only taking insulin  glargine 6 units daily, and no medications from her pack due to side effects (dizziness). Synjardy  was supposed to be taken out of pack after last PCP visit, but per her pharmacy, it was still included. I requested to remove it from her next pack. Patient was agreeable to schedule an in person appt to bring in her packs for correction and to discuss DM management and potentially set up CGM monitoring.  Today, ***  Today, patient reports doing ok. She is taking insulin  glargine 6 units daily. She is not taking any other oral medications. She reports that last week, she took her oral medication and she felt dizzy for 2-3 days. She is not sure which medication it was, but now the only thing she is taking is insulin .  Called [facility] to confirm contents of most recent pill packs. Pharmacy rep reported  that most recent pack was delivered on 12/25/23. This pack contained:  - Biktarvy  50-200-25 mg daily - Carvedilol  3.125 mg BID - Duloxetine  60 mg daily - Entresto  24-26 mg BID - rosuvastatin  10 mg daily - Synjardy  12.06-998 mg BID   They also confirmed that they recently filled insulin  glargine (Lantus ) and Ozempic  0.25/0.5 mg for her.   Care Team: Primary Care Provider: Norrine Sharper, MD ; Next Scheduled Visit: no show on 12/21/23, needs to be rescheduled  Medication Access/Adherence  Current Pharmacy:  [facility] - [place], [place] - [address] [facility] [address] [place] [address] Phone: [phone] Fax: [phone]  [facility] [phone] - [place] [address] AT [facility] OF [address] [facility] [address] [place] [address] Phone: [phone] Fax: [phone]  [facility] - [facility] [address], [address] [place] [address] Phone: [phone] Fax: [phone]   Patient reports affordability concerns with their medications: No  - [employer] Dual Complete Patient reports access/transportation concerns to their pharmacy: Yes  - using [facility] delivery Patient reports adherence concerns with their medications:  Yes  - as above   Diabetes:  Current medications: insulin  glargine (Lantus ) 6 units daily,  Medications tried in the past: Ozempic  (took one dose, states she is not planning to restart because she does not want to take multiple medications for her sugars)  Current glucose readings: recalls reading of 265 mg/dL this morning, Ozempic  0.25 mg  weekly (not taking) She does not have CGM supplies, but is interested in starting CGM monitoring  Patient reports hypoglycemic s/sx including dizziness- but this is more likely due to hypotension. Patient reports hyperglycemic symptoms including polyuria, polydipsia.  Current meal patterns: Did not discuss in  depth today  Macrovascular and Microvascular Risk Reduction:  Statin? yes (rosuvastatin  10 mg daily - not taking at the moment due to confusion with pill packs); ACEi/ARB? yes (Entresto  24-26 mg BID - not taking at the moment due to confusion with pill packs) Last urinary albumin/creatinine ratio:  Lab Results  Component Value Date   MICRALBCREAT 2,464 (H) 12/04/2023   MICRALBCREAT 1,462 (H) 05/29/2022   MICRALBCREAT 529 (H) 12/21/2020   MICRALBCREAT 268 (H) 03/12/2019   MICRALBCREAT 225.2 (H) 05/05/2017   MICRALBCREAT 41.8 (H) 01/22/2012   MICRALBCREAT 59.6 (H) 07/01/2011   Last eye exam:   Last foot exam: 06/12/2022 Tobacco Use:  Tobacco Use: Medium Risk (12/18/2023)   Patient History    Smoking Tobacco Use: Never    Smokeless Tobacco Use: Never    Passive Exposure: Current     Objective:  BP Readings from Last 3 Encounters:  12/26/23 (!) 180/82  12/18/23 (!) 112/55  12/04/23 (!) 158/98    Lab Results  Component Value Date   HGBA1C >14.0 (A) 12/04/2023   HGBA1C 13.1 (A) 01/23/2023   HGBA1C 10.1 (A) 09/04/2022       Latest Ref Rng & Units 12/26/2023   11:46 AM 12/18/2023   10:09 AM 04/15/2023    9:46 AM  BMP  Glucose 70 - 99 mg/dL 712  697  818   BUN 6 - 20 mg/dL 26  29  15    Creatinine 0.44 - 1.00 mg/dL 8.24  8.10  8.61   BUN/Creat Ratio 9 - 23  15  11    Sodium 135 - 145 mmol/L 136  136  140   Potassium 3.5 - 5.1 mmol/L 4.4  4.1  4.0   Chloride 98 - 111 mmol/L 102  102  104   CO2 22 - 32 mmol/L 22  17  19    Calcium  8.9 - 10.3 mg/dL 9.2  9.7  8.9     Lab Results  Component Value Date   CHOL 177 01/30/2023   HDL 43 (L) 01/30/2023   LDLCALC 107 (H) 01/30/2023   TRIG 157 (H) 01/30/2023   CHOLHDL 4.1 01/30/2023    Medications Reviewed Today   Medications were not reviewed in this encounter       Assessment/Plan:   Diabetes: - Currently uncontrolled with most recent A1C of > 14.0% above goal <7%. Medication adherence appears inappropriate due  to lack of understanding of regimen. Patient was supposed to stop Synjardy , but this was still included in her most recent pill back. Agree with stopping SGLT2i at the moment with urinary complaints and A1C > 10%. If she is going to continue on metformin , she will need a dose reduction with eGFR < 45 mL/min. She is no longer taking Ozempic  and is not willing to restart at this time despite discussion of risk/benefit today. She is willing to continue insulin  glargine at 6 units daily, but does not want to increase the dose today.  - Last UACR Oct 2025: 2464 mg/g - Reviewed long term cardiovascular and renal outcomes of uncontrolled blood sugar - Reviewed goal A1c, goal fasting, and goal 2 hour post prandial glucose - Reviewed hypoglycemia management plan and the rule of 15 - Recommend to continue insulin   glargine 6 units daily - I am leaving Ozempic  on her medication list for now. Will discuss restarting at follow-up.  - Recommend to check glucose twice daily: fasting and 2-hr PPG . Counseled patient to bring glucometer or BG log to every appointment. Will collaborate with [facility] pharmacy to request fill (delivery) of FL3+ sensor and CGM today. Patient aware to bring CGM supplies to appt on Thursday. - Next A1C due 03/06/23     Medication Management: - Currently strategy insufficient to maintain appropriate adherence to prescribed medication regimen. Patient continues to discontinue medications in pill packs because she does not understand what she is taking and she is having side effects to her medications like dizziness. Patient is agreeable to bring her pill packs in person to me on Thursday 01/08/24, at which time I will attempt to remove Synjardy . May remove carvedilol  and Entresto , pending BP control at that time.  - Called [facility] today to discontinue Synjardy  so it will not be included in next pill pack that is set to be filled on 01/21/24. Will continue to communicate medication  changes to pharmacy.  Patient verbalized understanding of treatment plan.    Follow Up Plan:  Pharmacist in person 01/08/24 PCP clinic visit need to reschedule   Lorain Baseman, PharmD [facility] [phone]

## 2024-01-09 ENCOUNTER — Ambulatory Visit: Admitting: Infectious Diseases

## 2024-01-12 ENCOUNTER — Ambulatory Visit

## 2024-01-12 NOTE — Progress Notes (Deleted)
 01/12/2024 Name: Ashley Vaughn MRN: [id number] DOB: 1965-06-07  No chief complaint on file.   Ashley Vaughn is a 59 y.o. year old female who presented for a face-to-face visit.   They were referred to the pharmacist by their PCP for assistance in managing diabetes, hypertension, and complex medication management. Ashley Vaughn PMH includes HTN, HFrEF ( EF30-35% in June 2023), T2DM, depression, cognitive impairment, HIV.   Subjective: Patient was seen by Dr. Elicia on 12/18/23. BP was 112/55 with acute dizziness and recent fall. Patient had recently restarted all oral medications for DM and HFrEF after not taking them for 5-6 months. She was instructed to stop Syjardy and hold Entresto  and carvedilol  until her follow-up visit. At follow-up with Dr. Foodwin on 12/26/23, BP was 180/82 mmHg. Her primary complaint was urinary frequency, determined to likely be from glucose elevation. She was instructed to start insulin  glargine (Solostar pen, as she had difficulty using the vial that was previously prescribed for her) at 6 units daily. At pharmacy telephone call on 01/05/24, patient reported she was only taking insulin  glargine 6 units daily, and no medications from her pack due to side effects (dizziness). Synjardy  was supposed to be taken out of pack after last PCP visit, but per her pharmacy, it was still included. I requested to remove it from her next pack. Patient was agreeable to schedule an in person appt to bring in her packs for correction and to discuss DM management and potentially set up CGM monitoring.  Today, ***  Today, patient reports doing ok. She is taking insulin  glargine 6 units daily. She is not taking any other oral medications. She reports that last week, she took her oral medication and she felt dizzy for 2-3 days. She is not sure which medication it was, but now the only thing she is taking is insulin .  Called [facility] to confirm contents of most recent pill packs. Pharmacy rep reported  that most recent pack was delivered on 12/25/23. This pack contained:  - Biktarvy  50-200-25 mg daily - Carvedilol  3.125 mg BID - Duloxetine  60 mg daily - Entresto  24-26 mg BID - rosuvastatin  10 mg daily - Synjardy  12.06-998 mg BID   They also confirmed that they recently filled insulin  glargine (Lantus ) and Ozempic  0.25/0.5 mg for her.   Care Team: Primary Care Provider: Norrine Sharper, MD ; Next Scheduled Visit: no show on 12/21/23, needs to be rescheduled  Medication Access/Adherence  Current Pharmacy:  [facility] - [place], [place] - [address] [facility] [address] [place] [address] Phone: [phone] Fax: [phone]  [facility] [phone] - [place], [place] [address] [facility] [address] [facility] [address] [place] [address] Phone: [phone] Fax: [phone]  [facility] [address], [address] [place] [address] Phone: [phone] Fax: [phone]   Patient reports affordability concerns with their medications: No  - [employer] Dual Complete Patient reports access/transportation concerns to their pharmacy: Yes  - using [facility] delivery Patient reports adherence concerns with their medications:  Yes  - as above   Diabetes:  Current medications: insulin  glargine (Lantus ) 6 units daily,  Medications tried in the past: Ozempic  (took one dose, states she is not planning to restart because she does not want to take multiple medications for her sugars)  Current glucose readings: recalls reading of 265 mg/dL this morning, Ozempic  0.25 mg  weekly (not taking) She does not have CGM supplies, but is interested in starting CGM monitoring  Patient reports hypoglycemic s/sx including dizziness- but this is more likely due to hypotension. Patient reports hyperglycemic symptoms including polyuria, polydipsia.  Current meal patterns: Did not discuss in  depth today  Macrovascular and Microvascular Risk Reduction:  Statin? yes (rosuvastatin  10 mg daily - not taking at the moment due to confusion with pill packs); ACEi/ARB? yes (Entresto  24-26 mg BID - not taking at the moment due to confusion with pill packs) Last urinary albumin/creatinine ratio:  Lab Results  Component Value Date   MICRALBCREAT 2,464 (H) 12/04/2023   MICRALBCREAT 1,462 (H) 05/29/2022   MICRALBCREAT 529 (H) 12/21/2020   MICRALBCREAT 268 (H) 03/12/2019   MICRALBCREAT 225.2 (H) 05/05/2017   MICRALBCREAT 41.8 (H) 01/22/2012   MICRALBCREAT 59.6 (H) 07/01/2011   Last eye exam:   Last foot exam: 06/12/2022 Tobacco Use:  Tobacco Use: Medium Risk (12/18/2023)   Patient History    Smoking Tobacco Use: Never    Smokeless Tobacco Use: Never    Passive Exposure: Current     Objective:  BP Readings from Last 3 Encounters:  12/26/23 (!) 180/82  12/18/23 (!) 112/55  12/04/23 (!) 158/98    Lab Results  Component Value Date   HGBA1C >14.0 (A) 12/04/2023   HGBA1C 13.1 (A) 01/23/2023   HGBA1C 10.1 (A) 09/04/2022       Latest Ref Rng & Units 12/26/2023   11:46 AM 12/18/2023   10:09 AM 04/15/2023    9:46 AM  BMP  Glucose 70 - 99 mg/dL 712  697  818   BUN 6 - 20 mg/dL 26  29  15    Creatinine 0.44 - 1.00 mg/dL 8.24  8.10  8.61   BUN/Creat Ratio 9 - 23  15  11    Sodium 135 - 145 mmol/L 136  136  140   Potassium 3.5 - 5.1 mmol/L 4.4  4.1  4.0   Chloride 98 - 111 mmol/L 102  102  104   CO2 22 - 32 mmol/L 22  17  19    Calcium  8.9 - 10.3 mg/dL 9.2  9.7  8.9     Lab Results  Component Value Date   CHOL 177 01/30/2023   HDL 43 (L) 01/30/2023   LDLCALC 107 (H) 01/30/2023   TRIG 157 (H) 01/30/2023   CHOLHDL 4.1 01/30/2023    Medications Reviewed Today   Medications were not reviewed in this encounter       Assessment/Plan:   Diabetes: - Currently uncontrolled with most recent A1C of > 14.0% above goal <7%. Medication adherence appears inappropriate due  to lack of understanding of regimen. Patient was supposed to stop Synjardy , but this was still included in her most recent pill back. Agree with stopping SGLT2i at the moment with urinary complaints and A1C > 10%. If she is going to continue on metformin , she will need a dose reduction with eGFR < 45 mL/min. She is no longer taking Ozempic  and is not willing to restart at this time despite discussion of risk/benefit today. She is willing to continue insulin  glargine at 6 units daily, but does not want to increase the dose today.  - Last UACR Oct 2025: 2464 mg/g - Reviewed long term cardiovascular and renal outcomes of uncontrolled blood sugar - Reviewed goal A1c, goal fasting, and goal 2 hour post prandial glucose - Reviewed hypoglycemia management plan and the rule of 15 - Recommend to continue insulin   glargine 6 units daily - I am leaving Ozempic  on her medication list for now. Will discuss restarting at follow-up.  - Recommend to check glucose twice daily: fasting and 2-hr PPG . Counseled patient to bring glucometer or BG log to every appointment. Will collaborate with [facility] pharmacy to request fill (delivery) of FL3+ sensor and CGM today. Patient aware to bring CGM supplies to appt on Thursday. - Next A1C due 03/06/23     Medication Management: - Currently strategy insufficient to maintain appropriate adherence to prescribed medication regimen. Patient continues to discontinue medications in pill packs because she does not understand what she is taking and she is having side effects to her medications like dizziness. Patient is agreeable to bring her pill packs in person to me on Thursday 01/08/24, at which time I will attempt to remove Synjardy . May remove carvedilol  and Entresto , pending BP control at that time.  - Called [facility] today to discontinue Synjardy  so it will not be included in next pill pack that is set to be filled on 01/21/24. Will continue to communicate medication  changes to pharmacy.  Patient verbalized understanding of treatment plan.    Follow Up Plan:  Pharmacist in person 01/08/24 PCP clinic visit need to reschedule   Lorain Baseman, PharmD [facility] [phone]

## 2024-01-20 ENCOUNTER — Other Ambulatory Visit: Payer: Self-pay | Admitting: Student

## 2024-01-20 ENCOUNTER — Telehealth: Payer: Self-pay

## 2024-01-20 DIAGNOSIS — I5020 Unspecified systolic (congestive) heart failure: Secondary | ICD-10-CM

## 2024-01-20 NOTE — Telephone Encounter (Signed)
 Medication sent to pharmacy

## 2024-01-20 NOTE — Progress Notes (Signed)
 Complex Care Management Care Guide Note  01/20/2024 Name: GABRIELLIA REMPEL MRN: [id number] DOB: 1965-11-24  DHRITI FALES is a 58 y.o. year old female who is a primary care patient of Norrine Sharper, MD and is actively engaged with the care management team. I reached out to [name] by phone today to assist with re-scheduling  with the Pharmacist.  Follow up plan: Unsuccessful telephone outreach attempt made. A HIPAA compliant phone message was left for the patient providing contact information and requesting a return call.  Leotis Rase [facility] Guide  Direct Dial: [phone]  Fax [phone]

## 2024-01-23 ENCOUNTER — Other Ambulatory Visit: Payer: Self-pay | Admitting: Student

## 2024-01-23 DIAGNOSIS — E1140 Type 2 diabetes mellitus with diabetic neuropathy, unspecified: Secondary | ICD-10-CM

## 2024-01-29 ENCOUNTER — Other Ambulatory Visit: Payer: Self-pay

## 2024-01-29 ENCOUNTER — Ambulatory Visit (INDEPENDENT_AMBULATORY_CARE_PROVIDER_SITE_OTHER): Admitting: Infectious Diseases

## 2024-01-29 ENCOUNTER — Encounter: Payer: Self-pay | Admitting: Infectious Diseases

## 2024-01-29 VITALS — BP 142/75 | HR 78 | Temp 97.9°F | Ht 62.0 in | Wt 150.0 lb

## 2024-01-29 DIAGNOSIS — Z113 Encounter for screening for infections with a predominantly sexual mode of transmission: Secondary | ICD-10-CM | POA: Diagnosis not present

## 2024-01-29 DIAGNOSIS — Z79899 Other long term (current) drug therapy: Secondary | ICD-10-CM | POA: Diagnosis not present

## 2024-01-29 DIAGNOSIS — B20 Human immunodeficiency virus [HIV] disease: Secondary | ICD-10-CM

## 2024-01-29 NOTE — Progress Notes (Signed)
 Complex Care Management Care Guide Note  01/29/2024 Name: SHARDAY MICHL MRN: [id number] DOB: 10/25/1965  ROZELIA CATAPANO is a 58 y.o. year old female who is a primary care patient of Norrine Sharper, MD and is actively engaged with the care management team. I reached out to [name] by phone today to assist with re-scheduling  with the Pharmacist.  Follow up plan: Unsuccessful telephone outreach attempt made. A HIPAA compliant phone message was left for the patient providing contact information and requesting a return call.  Leotis Rase [facility] Guide  Direct Dial: [phone]  Fax [phone]

## 2024-01-29 NOTE — Progress Notes (Signed)
 Name: Ashley Vaughn  DOB: Jul 18, 1965 MRN: [id number] PCP: Norrine Sharper, MD    Subjective:   Chief Complaint  Patient presents with   Follow-up    B20     Discussed the use of AI scribe software for clinical note transcription with the patient, who gave verbal consent to proceed.  History of Present Illness   Ashley Vaughn is a 58 year old female with HIV who presents for routine follow-up care and treatment.  She has missed several appointments since her last visit in December 2024. She is currently on Biktarvy  for HIV management, with lab results pending to assess her current status.  She is also managing hypertension and diabetes with the internal medicine clinic. Her last visit to internal medicine was on November 7th, where she experienced urge incontinence and pain. Her blood pressure was recorded at 180 systolic during that visit. Her A1c is persistently above fourteen. She is on an all injectable regimen for diabetes management due to changes in her kidney function. She takes insulin  daily, sometimes missing a dose, and reports her blood sugar was 480 last night, occasionally reaching 500 when she misses a dose. Her morning blood sugar levels are around 120 to 140. She experiences neuropathy in her feet, causing achiness, especially at night.  She has concerns about a blue and white diabetes pill, possibly duloxetine , which she does not take due to side effects. She is not using glucose monitors on her arm but checks her blood sugar by pricking her finger.  Her social history includes living with four to five people in the house, which impacts her dietary habits. She tries to maintain a healthy diet by eating boiled eggs for breakfast and baked chicken for dinner, avoiding sweets and bread. She reports difficulty in maintaining a consistent diet due to the number of people in her household.  She reports not sleeping well due to achy feet from neuropathy and feels stressed due to  her responsibilities, including caring for her grandchildren. She wants to rest and manage her health better.          01/29/2024    9:26 AM  Depression screen PHQ 2/9  Decreased Interest 1  Down, Depressed, Hopeless 1  PHQ - 2 Score 2  Altered sleeping 0  Tired, decreased energy 1  Change in appetite 0  Feeling bad or failure about yourself  1  Trouble concentrating 1  Moving slowly or fidgety/restless 0  Suicidal thoughts 0  PHQ-9 Score 5  Difficult doing work/chores Somewhat difficult    Review of Systems  Constitutional:  Negative for chills, fever, malaise/fatigue and weight loss.  HENT:  Negative for sore throat.        No dental problems  Respiratory:  Negative for cough and sputum production.   Cardiovascular:  Negative for chest pain and leg swelling.  Gastrointestinal:  Negative for abdominal pain, diarrhea and vomiting.  Genitourinary:  Negative for dysuria and flank pain.  Musculoskeletal:  Negative for joint pain, myalgias and neck pain.  Skin:  Negative for rash.  Neurological:  Negative for dizziness, tingling and headaches.  Psychiatric/Behavioral:  Negative for depression and substance abuse. The patient is not nervous/anxious and does not have insomnia.     Past Medical History:  Diagnosis Date   AKI (acute kidney injury) 08/24/2021   CVA (cerebral vascular accident) (HCC) 07/20/2021   Depression    Diabetes mellitus    Fistula    Heart failure (HCC)  HIV infection (HCC)    HSV (herpes simplex virus) anogenital infection 03/12/2019   Postoperative infection 07/18/2021   Patient had appendectomy early in May, followed by a hospitalization and drain placement for an intrabdominal infection and abscess.   Postprocedural intraabdominal abscess (HCC) 07/20/2021   Intra-abdominal abscess with fistulous communication between abscess cavity and cecum.   S/P laparoscopic appendectomy 06/22/2021   Seizure (HCC) 07/18/2021    Outpatient Medications Prior  to Visit  Medication Sig Dispense Refill   bictegravir-emtricitabine -tenofovir  AF (BIKTARVY ) 50-200-25 MG TABS tablet Take 1 tablet by mouth daily. 30 tablet 5   Blood Glucose Monitoring Suppl (BLOOD GLUCOSE MONITOR SYSTEM) w/Device KIT Use up to four times daily as directed. (FOR ICD-10 E10.9, E11.9). 1 kit 0   carvedilol  (COREG ) 3.125 MG tablet Take 1 tablet (3.125 mg total) by mouth 2 (two) times daily with a meal. 60 tablet 3   Continuous Glucose Receiver (FREESTYLE LIBRE 3 READER) DEVI Use as directed to monitor glucose with freestyle libre 3 sensor 1 each 0   Continuous Glucose Sensor (FREESTYLE LIBRE 3 PLUS SENSOR) MISC Change sensor every 15 days. 6 each 3   diclofenac  Sodium (VOLTAREN  ARTHRITIS PAIN) 1 % GEL Apply 2 g topically 4 (four) times daily. 350 g 1   DULoxetine  (CYMBALTA ) 60 MG capsule TAKE 1 CAPSULE (60 MG TOTAL) BY MOUTH DAILY. 30 capsule 0   Ensure (ENSURE) Take 237 mLs by mouth 2 (two) times daily between meals. 237 mL 12   Glucose Blood (BLOOD GLUCOSE TEST STRIPS) STRP Use up to four times daily as directed. (FOR ICD-10 E10.9, E11.9). 100 strip 0   insulin  glargine (LANTUS  SOLOSTAR) 100 UNIT/ML Solostar Pen Inject 6 Units into the skin at bedtime. 15 mL 1   Insulin  Pen Needle (PEN NEEDLES 3/16) 31G X 5 MM MISC Use once to inject insulin  100 each 3   leptospermum manuka honey (MEDIHONEY) PSTE paste Apply 1 Application topically daily. Apply once daily to sacral wound with gauze over top 44 mL 0   rosuvastatin  (CRESTOR ) 10 MG tablet Take 1 tablet (10 mg total) by mouth daily. 30 tablet 10   sacubitril -valsartan  (ENTRESTO ) 24-26 MG TAKE 1 TABLET BY MOUTH TWO (TWO) TIMES DAILY. 60 tablet 0   Semaglutide ,0.25 or 0.5MG /DOS, 2 MG/3ML SOPN Inject 0.25 mg into the skin once a week. 3 mL 3   No facility-administered medications prior to visit.     Allergies  Allergen Reactions   Metformin  And Related     Unknown reaction    Fluorescein  Itching and Rash    INTRAVENOUSLY        Social History   Substance and Sexual Activity  Sexual Activity Never   Partners: Male   Comment: accepted condoms     Objective:   Vitals:   01/29/24 0843  BP: (!) 142/75  Pulse: 78  Temp: 97.9 F (36.6 C)  TempSrc: Temporal  SpO2: 100%  Weight: 150 lb (68 kg)  Height: 5' 2 (1.575 m)   Body mass index is 27.44 kg/m.  Physical Exam Constitutional:      Appearance: Normal appearance. She is not ill-appearing.  HENT:     Mouth/Throat:     Mouth: Mucous membranes are moist.     Pharynx: Oropharynx is clear.  Eyes:     General: No scleral icterus. Pulmonary:     Effort: Pulmonary effort is normal.  Neurological:     Mental Status: She is oriented to person, place, and time.  Psychiatric:  Mood and Affect: Mood normal.        Thought Content: Thought content normal.     Lab Results Lab Results  Component Value Date   WBC 6.9 12/18/2023   HGB 10.7 (L) 12/18/2023   HCT 36.6 12/18/2023   MCV 72 (L) 12/18/2023   PLT 315 12/18/2023    Lab Results  Component Value Date   CREATININE 1.75 (H) 12/26/2023   BUN 26 (H) 12/26/2023   NA 136 12/26/2023   K 4.4 12/26/2023   CL 102 12/26/2023   CO2 22 12/26/2023    Lab Results  Component Value Date   ALT 25 07/29/2022   AST 25 07/29/2022   ALKPHOS 113 07/29/2022   BILITOT 0.3 07/29/2022    Lab Results  Component Value Date   CHOL 177 01/30/2023   HDL 43 (L) 01/30/2023   LDLCALC 107 (H) 01/30/2023   TRIG 157 (H) 01/30/2023   CHOLHDL 4.1 01/30/2023   HIV 1 RNA Quant (Copies/mL)  Date Value  04/28/2023 <20 (H)  09/10/2022 86 (H)  01/31/2022 Not Detected   CD4 T Cell Abs (/uL)  Date Value  04/28/2023 1,176  09/10/2022 1,201  08/28/2021 1,076     Assessment & Plan:  Assessment and Plan    Human immunodeficiency virus (HIV) disease - Routine follow-up for HIV care and treatment. Last visit was in December 2024. Missed a few appointments since then. Currently on Biktarvy  for HIV  management. Awaiting lab results to assess current status. - Will review lab results once available to assess HIV status, but she traditionally has done very well and is undetectable with adequate CD4 count.  - Continue Biktarvy  for HIV management  Polypharmacy - Concerns about taking too many medications. Currently on an all-injectable regimen for diabetes management due to changes in kidney function, however she is not taking the Ozempic . A1c persistently above 14, indicating uncontrolled diabetes. Experiencing dizziness, possibly related to medication side effects. Not using glucose monitors but checks blood sugar by pricking finger. Blood sugar levels fluctuate, with high readings when insulin  is missed (mentions 400-500 last night). Discussed potential addition of Ozempic  or Mounjaro if insulin  alone is insufficient. Consideration of endocrinologist consultation for diabetes management, but she prefers to stay with current team due to transportation and preference issues.  She plans to transition care to [facility].  - Identify the specific medication causing dizziness and discuss with healthcare provider - Continue current insulin  regimen and monitor blood sugar levels - Will consider adding Ozempic  or Mounjaro if insulin  alone is insufficient - Will discuss potential referral to endocrinologist for diabetes management if needed     No orders of the defined types were placed in this encounter.  Orders Placed This Encounter  Procedures   Urine Culture   T-helper cell (CD4)- ([facility] clinic only)   RPR W/RFLX TO RPR TITER, TREPONEMAL AB, SCREEN AND DIAGNOSIS   Lipid panel   HIV-1 RNA quant-no reflex-bld   COMPLETE METABOLIC PANEL WITHOUT GFR   CBC with Differential/Platelet   Return in about 6 months (around 07/29/2024).     Corean Fireman, MSN, NP-C [facility] for Infectious Disease [facility] Pager: [phone] Office: [phone]  01/29/2024  3:06  PM

## 2024-01-29 NOTE — Patient Instructions (Signed)
 Please continue the biktarvy  every day   Bring the pill and if you have it - the bottle to your next appointment with the internal medicine team to show them which one is not doing well for you.   Please call the [facility] to set up your mammogram.  [phone] [address], [address] [place], [place] [address]

## 2024-01-30 LAB — URINE CULTURE
MICRO NUMBER:: 17344255
SPECIMEN QUALITY:: ADEQUATE

## 2024-01-30 LAB — T-HELPER CELL (CD4) - (RCID CLINIC ONLY)
CD4 % Helper T Cell: 36 % (ref 33–65)
CD4 T Cell Abs: 840 /uL (ref 400–1790)

## 2024-01-31 LAB — LIPID PANEL
Cholesterol: 127 mg/dL (ref <200)
HDL: 38 mg/dL — ABNORMAL LOW (ref >=50)
LDL Cholesterol (Calc): 68 mg/dL
Non-HDL Cholesterol (Calc): 89 mg/dL (ref <130)
Total CHOL/HDL Ratio: 3.3 (calc) (ref <5.0)
Triglycerides: 133 mg/dL (ref <150)

## 2024-01-31 LAB — COMPLETE METABOLIC PANEL WITHOUT GFR
AG Ratio: 1 (calc) (ref 1.0–2.5)
ALT: 10 U/L (ref 6–29)
AST: 12 U/L (ref 10–35)
Albumin: 3.9 g/dL (ref 3.6–5.1)
Alkaline phosphatase (APISO): 107 U/L (ref 37–153)
BUN/Creatinine Ratio: 17 (calc) (ref 6–22)
BUN: 32 mg/dL — ABNORMAL HIGH (ref 7–25)
CO2: 23 mmol/L (ref 20–32)
Calcium: 9.2 mg/dL (ref 8.6–10.4)
Chloride: 103 mmol/L (ref 98–110)
Creat: 1.86 mg/dL — ABNORMAL HIGH (ref 0.50–1.03)
Globulin: 3.9 g/dL — ABNORMAL HIGH (ref 1.9–3.7)
Glucose, Bld: 265 mg/dL — ABNORMAL HIGH (ref 65–99)
Potassium: 4.4 mmol/L (ref 3.5–5.3)
Sodium: 136 mmol/L (ref 135–146)
Total Bilirubin: 0.2 mg/dL (ref 0.2–1.2)
Total Protein: 7.8 g/dL (ref 6.1–8.1)

## 2024-01-31 LAB — HIV-1 RNA QUANT-NO REFLEX-BLD
HIV 1 RNA Quant: NOT DETECTED {copies}/mL
HIV-1 RNA Quant, Log: NOT DETECTED {Log_copies}/mL

## 2024-01-31 LAB — CBC WITH DIFFERENTIAL/PLATELET
Absolute Lymphocytes: 2615 {cells}/uL (ref 850–3900)
Absolute Monocytes: 320 {cells}/uL (ref 200–950)
Basophils Absolute: 30 {cells}/uL (ref 0–200)
Basophils Relative: 0.6 %
Eosinophils Absolute: 60 {cells}/uL (ref 15–500)
Eosinophils Relative: 1.2 %
HCT: 35.5 % — ABNORMAL LOW (ref 35.9–46.0)
Hemoglobin: 10.3 g/dL — ABNORMAL LOW (ref 11.7–15.5)
MCH: 20.7 pg — ABNORMAL LOW (ref 27.0–33.0)
MCHC: 29 g/dL — ABNORMAL LOW (ref 31.6–35.4)
MCV: 71.3 fL — ABNORMAL LOW (ref 81.4–101.7)
MPV: 10.5 fL (ref 7.5–12.5)
Monocytes Relative: 6.4 %
Neutro Abs: 1975 {cells}/uL (ref 1500–7800)
Neutrophils Relative %: 39.5 %
Platelets: 358 Thousand/uL (ref 140–400)
RBC: 4.98 Million/uL (ref 3.80–5.10)
RDW: 15.2 % — ABNORMAL HIGH (ref 11.0–15.0)
Total Lymphocyte: 52.3 %
WBC: 5 Thousand/uL (ref 3.8–10.8)

## 2024-01-31 LAB — SYPHILIS: RPR W/REFLEX TO RPR TITER AND TREPONEMAL ANTIBODIES, TRADITIONAL SCREENING AND DIAGNOSIS ALGORITHM: RPR Ser Ql: NONREACTIVE

## 2024-02-04 ENCOUNTER — Ambulatory Visit: Payer: Self-pay | Admitting: Infectious Diseases

## 2024-02-04 DIAGNOSIS — B20 Human immunodeficiency virus [HIV] disease: Secondary | ICD-10-CM

## 2024-02-05 NOTE — Telephone Encounter (Signed)
 Ashley Vaughn returned call, discussed change from Biktarvy  to Dovato due to kidney function. Discussed she should take Dovato once daily like the Biktarvy  and STOP Biktarvy  once she gets the new prescription. Asked her to call with any questions or difficulty getting her new medication. 6 week follow up scheduled.   Marshawn Normoyle, BSN, RN

## 2024-02-05 NOTE — Telephone Encounter (Signed)
 Called Ashley Vaughn, no answer and voicemail full.   Reinhold Rickey, BSN, RN

## 2024-02-05 NOTE — Addendum Note (Signed)
 Addended by: FLORENE BOUCHARD D on: 02/05/2024 09:40 AM   Modules accepted: Orders

## 2024-02-25 ENCOUNTER — Other Ambulatory Visit: Payer: Self-pay | Admitting: *Deleted

## 2024-02-25 DIAGNOSIS — I5020 Unspecified systolic (congestive) heart failure: Secondary | ICD-10-CM

## 2024-02-25 NOTE — Telephone Encounter (Signed)
 Received faxed refill request from pharmacy for entresto  24-26 mg tabs   Take 1 tab my mouth two times daily #60 Will send to pcp

## 2024-02-26 ENCOUNTER — Telehealth: Payer: Self-pay | Admitting: *Deleted

## 2024-02-26 MED ORDER — SACUBITRIL-VALSARTAN 24-26 MG PO TABS: 1.0000 | ORAL_TABLET | Freq: Two times a day (BID) | ORAL | 0 refills | Status: DC

## 2024-02-26 NOTE — Telephone Encounter (Signed)
 Mammogram appointment January 28.2026 @ 12:50 pm to arrive 12:30 pm / breast center [phone]. Appointment mailed to the patient with the information regarding the 75.00 no show fee.

## 2024-03-09 ENCOUNTER — Telehealth: Payer: Self-pay

## 2024-03-09 ENCOUNTER — Other Ambulatory Visit: Payer: Self-pay

## 2024-03-09 NOTE — Telephone Encounter (Signed)
 Notified by front desk that patient came in requesting refill. Relayed that refill should be on file at [facility] to get her to her next appointment on 2/2. Patient asking about lab results from last appointment, notified front desk that results were reviewed with patient on 12/18, prompting switch from Biktarvy  to Dovato.   Ashley Vaughn, BSN, RN

## 2024-03-10 ENCOUNTER — Ambulatory Visit: Payer: Self-pay | Admitting: Dietician

## 2024-03-10 ENCOUNTER — Ambulatory Visit: Payer: Self-pay | Admitting: Student

## 2024-03-17 ENCOUNTER — Ambulatory Visit

## 2024-03-22 ENCOUNTER — Ambulatory Visit: Admitting: Infectious Diseases

## 2024-03-22 ENCOUNTER — Ambulatory Visit: Payer: Self-pay | Admitting: Infectious Diseases

## 2024-03-23 ENCOUNTER — Other Ambulatory Visit: Payer: Self-pay | Admitting: Student

## 2024-03-23 DIAGNOSIS — I5020 Unspecified systolic (congestive) heart failure: Secondary | ICD-10-CM

## 2024-03-23 NOTE — Telephone Encounter (Signed)
 Medication sent to pharmacy

## 2024-03-25 ENCOUNTER — Telehealth: Payer: Self-pay | Admitting: *Deleted

## 2024-03-25 NOTE — Telephone Encounter (Signed)
 Mammogram appointment 03-17-2024 canceled / per breast center 4 attempts to contact patient.

## 2024-03-29 ENCOUNTER — Other Ambulatory Visit: Payer: Self-pay

## 2024-03-30 ENCOUNTER — Ambulatory Visit: Admitting: Infectious Diseases

## 2024-08-09 ENCOUNTER — Ambulatory Visit: Payer: Self-pay | Admitting: Infectious Diseases
